# Patient Record
Sex: Male | Born: 1955 | Race: Black or African American | Hispanic: No | Smoking: Never smoker
Health system: Southern US, Community
[De-identification: ages and names within clinical notes are randomized; demographics above are authoritative.]

## PROBLEM LIST (undated history)

## (undated) DIAGNOSIS — I509 Heart failure, unspecified: Secondary | ICD-10-CM

## (undated) DIAGNOSIS — M1990 Unspecified osteoarthritis, unspecified site: Secondary | ICD-10-CM

## (undated) DIAGNOSIS — Z992 Dependence on renal dialysis: Secondary | ICD-10-CM

## (undated) DIAGNOSIS — N189 Chronic kidney disease, unspecified: Secondary | ICD-10-CM

## (undated) DIAGNOSIS — N186 End stage renal disease: Secondary | ICD-10-CM

## (undated) DIAGNOSIS — I10 Essential (primary) hypertension: Secondary | ICD-10-CM

## (undated) DIAGNOSIS — E119 Type 2 diabetes mellitus without complications: Secondary | ICD-10-CM

## (undated) HISTORY — PX: SPINE SURGERY: SHX786

## (undated) HISTORY — DX: Unspecified osteoarthritis, unspecified site: M19.90

## (undated) HISTORY — DX: Heart failure, unspecified: I50.9

## (undated) HISTORY — PX: KIDNEY TRANSPLANT: SHX239

## (undated) HISTORY — DX: Type 2 diabetes mellitus without complications: E11.9

## (undated) HISTORY — DX: Essential (primary) hypertension: I10

## (undated) HISTORY — DX: Chronic kidney disease, unspecified: N18.9

---

## 2000-09-17 ENCOUNTER — Inpatient Hospital Stay (HOSPITAL_COMMUNITY): Admission: EM | Admit: 2000-09-17 | Discharge: 2000-09-20 | Payer: Self-pay | Admitting: Emergency Medicine

## 2000-09-17 ENCOUNTER — Encounter: Payer: Self-pay | Admitting: Emergency Medicine

## 2000-09-17 ENCOUNTER — Encounter: Payer: Self-pay | Admitting: Internal Medicine

## 2000-09-18 ENCOUNTER — Encounter: Payer: Self-pay | Admitting: Internal Medicine

## 2000-09-20 ENCOUNTER — Encounter: Payer: Self-pay | Admitting: Internal Medicine

## 2000-09-28 ENCOUNTER — Encounter: Admission: RE | Admit: 2000-09-28 | Discharge: 2000-09-28 | Payer: Self-pay | Admitting: Internal Medicine

## 2000-10-10 ENCOUNTER — Emergency Department (HOSPITAL_COMMUNITY): Admission: EM | Admit: 2000-10-10 | Discharge: 2000-10-10 | Payer: Self-pay | Admitting: Emergency Medicine

## 2000-10-12 ENCOUNTER — Emergency Department (HOSPITAL_COMMUNITY): Admission: EM | Admit: 2000-10-12 | Discharge: 2000-10-12 | Payer: Self-pay | Admitting: Emergency Medicine

## 2000-11-25 ENCOUNTER — Encounter: Admission: RE | Admit: 2000-11-25 | Discharge: 2000-11-25 | Payer: Self-pay | Admitting: Internal Medicine

## 2000-11-30 ENCOUNTER — Encounter: Admission: RE | Admit: 2000-11-30 | Discharge: 2000-11-30 | Payer: Self-pay | Admitting: Internal Medicine

## 2000-12-02 ENCOUNTER — Encounter: Admission: RE | Admit: 2000-12-02 | Discharge: 2000-12-02 | Payer: Self-pay | Admitting: Internal Medicine

## 2003-01-15 ENCOUNTER — Emergency Department (HOSPITAL_COMMUNITY): Admission: EM | Admit: 2003-01-15 | Discharge: 2003-01-15 | Payer: Self-pay | Admitting: Emergency Medicine

## 2008-03-16 HISTORY — PX: KIDNEY TRANSPLANT: SHX239

## 2013-06-06 ENCOUNTER — Other Ambulatory Visit: Payer: Self-pay | Admitting: Orthopedic Surgery

## 2013-06-06 DIAGNOSIS — M545 Low back pain, unspecified: Secondary | ICD-10-CM

## 2013-06-07 ENCOUNTER — Ambulatory Visit
Admission: RE | Admit: 2013-06-07 | Discharge: 2013-06-07 | Disposition: A | Discharge status: Home / Self Care | Payer: Medicare Other | Source: Ambulatory Visit | Attending: Orthopedic Surgery | Admitting: Orthopedic Surgery

## 2013-06-07 DIAGNOSIS — M545 Low back pain, unspecified: Secondary | ICD-10-CM

## 2013-07-12 DIAGNOSIS — E118 Type 2 diabetes mellitus with unspecified complications: Secondary | ICD-10-CM

## 2013-07-12 HISTORY — DX: Type 2 diabetes mellitus with unspecified complications: E11.8

## 2013-08-10 ENCOUNTER — Ambulatory Visit (INDEPENDENT_AMBULATORY_CARE_PROVIDER_SITE_OTHER): Payer: Medicare HMO | Admitting: Emergency Medicine

## 2013-08-10 VITALS — BP 127/82 | HR 69 | Temp 97.9°F | Resp 16 | Ht 72.0 in | Wt 238.2 lb

## 2013-08-10 DIAGNOSIS — M5430 Sciatica, unspecified side: Secondary | ICD-10-CM

## 2013-08-10 DIAGNOSIS — Z940 Kidney transplant status: Secondary | ICD-10-CM

## 2013-08-10 HISTORY — DX: Kidney transplant status: Z94.0

## 2013-08-10 MED ORDER — OXYCODONE-ACETAMINOPHEN 10-325 MG PO TABS: 1.0000 | ORAL_TABLET | Freq: Three times a day (TID) | ORAL | Status: DC | PRN

## 2013-08-10 NOTE — Progress Notes (Signed)
[facility] [address], [place] [address] [phone]  Date:  08/10/2013   Name:  Jacob Daniel   DOB:  May 12, 1955   MRN:  [id number]  PCP:  Roselee Culver, MD    Chief Complaint: Back Pain   History of Present Illness:  Jacob Daniel is a 58 y.o. very pleasant male patient who presents with the following:  Comes in with history of prior lumbar spine surgery and has been under the care of a doctor in [place] and has recently moved back to the area.  MRI in March done and reviewed.  Had been receiving medication from nephrologist but he refused to provide further meds and suggested he see his FMD.  Has been taking oxycodone 10/325.  Has weakness in left leg with prolonged walking and no radicular pain.  Pain seems limited to low back.  Denies recent injury or overuse.  Retired [employer].  No improvement with over the counter medications or other home remedies. Denies other complaint or health concern today.   There are no active problems to display for this patient.   Past Medical History  Diagnosis Date  . CHF (congestive heart failure)   . Arthritis   . Chronic kidney disease   . Hypertension   . Diabetes mellitus without complication     Past Surgical History  Procedure Laterality Date  . Spine surgery    . Kidney transplant      History  Substance Use Topics  . Smoking status: Never Smoker   . Smokeless tobacco: Not on file  . Alcohol Use: Yes    History reviewed. No pertinent family history.  No Known Allergies  Medication list has been reviewed and updated.  No current outpatient prescriptions on file prior to visit.   No current facility-administered medications on file prior to visit.    Review of Systems:  As per HPI, otherwise negative.    Physical Examination: Filed Vitals:   08/10/13 1421  BP: 127/82  Pulse: 69  Temp: 97.9 F (36.6 C)  Resp: 16   Filed Vitals:   08/10/13 1421  Height: 6' (1.829 m)  Weight:  238 lb 3.2 oz (108.047 kg)   Body mass index is 32.3 kg/(m^2). Ideal Body Weight: Weight in (lb) to have BMI = 25: 183.9   GEN: WDWN, NAD, Non-toxic, Alert & Oriented x 3 HEENT: Atraumatic, Normocephalic.  Ears and Nose: No external deformity. EXTR: No clubbing/cyanosis/edema NEURO: Normal gait.  PSYCH: Normally interactive. Conversant. Not depressed or anxious appearing.  Calm demeanor.  Back:  Tender lumbar paraspinous.  Reflexes and motor intact.     Assessment and Plan: Sciatic neuritis Offered injections but refused Referred to pain management Oxycodone #30  Signed,  Ellison Carwin, MD

## 2013-08-29 ENCOUNTER — Telehealth: Payer: Self-pay

## 2013-08-29 NOTE — Telephone Encounter (Signed)
Patient called to ask about other locations of pain management. He says we referred him before. I told him I will call back and investigate for him. Looking into his chart he refused injections from pain clinic in [place]. I wonder if he was dismissed from practice recently? I need to call patient tomorrow and find out.   [phone]

## 2013-08-30 NOTE — Telephone Encounter (Signed)
Spoke with patient and let him know that we are waiting on anderson to respond to the message and that referrals will probably be sending to [facility] medical pain clinic

## 2013-08-30 NOTE — Telephone Encounter (Signed)
Patient is needing a refill for pain medication from Dr. Ouida Sills. oxyCODONE-acetaminophen (PERCOCET) 10-325 MG per tablet   Please call patient when ready for pick up.   Patient also waiting to hear back status of pain clinic appointment. He explained that he refused injections at [facility] (not a pain clinic- that's what patient said) because he saw the damage the injections did to his sister and she has issues walking still. This explains the message in epic about refusing injections. Patient clarified.

## 2013-09-01 NOTE — Telephone Encounter (Signed)
The patient will need to be seen for a refill on medication.    None of [place] are accepting patients requiring chronic narcotic treatment.  We need to find the status of his referral to pain management.  Also find out if he has done what is necessary to obtain medical records.

## 2013-09-03 NOTE — Telephone Encounter (Signed)
He does have his medical records.  He will RTC for a visit with Dr Ouida Sills while we are waiting on his referral to [facility].

## 2013-09-08 ENCOUNTER — Ambulatory Visit (INDEPENDENT_AMBULATORY_CARE_PROVIDER_SITE_OTHER): Payer: Medicare HMO | Admitting: Family Medicine

## 2013-09-08 VITALS — BP 124/78 | HR 64 | Temp 98.4°F | Resp 16 | Ht 72.0 in | Wt 235.8 lb

## 2013-09-08 DIAGNOSIS — M5430 Sciatica, unspecified side: Secondary | ICD-10-CM | POA: Insufficient documentation

## 2013-09-08 DIAGNOSIS — M545 Low back pain, unspecified: Secondary | ICD-10-CM

## 2013-09-08 DIAGNOSIS — I10 Essential (primary) hypertension: Secondary | ICD-10-CM | POA: Insufficient documentation

## 2013-09-08 DIAGNOSIS — M5432 Sciatica, left side: Secondary | ICD-10-CM

## 2013-09-08 HISTORY — DX: Sciatica, unspecified side: M54.30

## 2013-09-08 MED ORDER — OXYCODONE-ACETAMINOPHEN 10-325 MG PO TABS: 1.0000 | ORAL_TABLET | Freq: Three times a day (TID) | ORAL | Status: DC | PRN

## 2013-09-08 NOTE — Progress Notes (Signed)
Subjective:    Patient ID: Jacob Daniel, male    DOB: 06-Jul-1955, 58 y.o.   MRN: [id number]  Back Pain Associated symptoms include leg pain.  Leg Pain    Chief Complaint  Patient presents with   Back Pain    x 15 years   Leg Pain    sciatic nerve on left leg   This chart was scribed for Robyn Haber, MD by Thea Alken, ED Scribe. This patient was seen in room 1 and the patient's care was started at 9:55 AM.  HPI Comments: Jacob Daniel is a 58 y.o. male who [facility] complaining of constant left leg and back pain. Pt reports h/o back surgery 15 years ago and  siatica to left leg. States he has had back pain since surgery. Pt usually see Dr. Ouida Sills. Pt reports he takes percocet's 3 times a day for the pain. Pt has h/o of kidney transplant in to 2010 due to HTN. Pt denies complications. Pt is from [place] and has not found a neuprologist in the area.   Pt is retired from [employer].   Patient Active Problem List   Diagnosis Date Noted   History of renal transplant 08/10/2013   Past Medical History  Diagnosis Date   CHF (congestive heart failure)    Arthritis    Chronic kidney disease    Hypertension    Diabetes mellitus without complication    No Known Allergies Prior to Admission medications   Medication Sig Start Date End Date Taking? Authorizing Provider  AMLODIPINE BESYLATE PO Take by mouth.   Yes Historical Provider, MD  aspirin 81 MG tablet Take 81 mg by mouth daily.   Yes Historical Provider, MD  CARVEDILOL PO Take by mouth.   Yes Historical Provider, MD  GLIPIZIDE PO Take by mouth.   Yes Historical Provider, MD  Mycophenolate Mofetil (CELLCEPT PO) Take by mouth.   Yes Historical Provider, MD  oxyCODONE-acetaminophen (PERCOCET) 10-325 MG per tablet Take 1 tablet by mouth every 8 (eight) hours as needed for pain. 08/10/13  Yes Ellison Carwin, MD  Tacrolimus (PROGRAF PO) Take by mouth.   Yes Historical  Provider, MD   Review of Systems  Musculoskeletal: Positive for back pain.      Objective:   Physical Exam  Nursing note and vitals reviewed. Constitutional: He is oriented to person, place, and time. He appears well-developed and well-nourished. No distress.  HENT:  Head: Normocephalic and atraumatic.  Eyes: Conjunctivae and EOM are normal.  Neck: Neck supple. No tracheal deviation present.  Cardiovascular: Normal rate.   Pulmonary/Chest: Effort normal. No respiratory distress.  Abdominal: Soft. He exhibits no distension and no mass. There is no tenderness. There is no rebound and no guarding.  Musculoskeletal: Normal range of motion.  2+ tibial edema. Positive straight leg raise on the left. Negative right.    Neurological: He is alert and oriented to person, place, and time.  Diminished reflex   Skin: Skin is warm and dry.  Psychiatric: He has a normal mood and affect. His behavior is normal.      Assessment & Plan:   1. Sciatica, left   2. Left low back pain, with sciatica presence unspecified    Meds ordered this encounter  Medications   oxyCODONE-acetaminophen (PERCOCET) 10-325 MG per tablet    Sig: Take 1 tablet by mouth every 8 (eight) hours as needed for pain.    Dispense:  90  tablet    Refill:  0    pain clinic appt pending in July  Robyn Haber, MD  .

## 2013-09-20 ENCOUNTER — Telehealth: Payer: Self-pay

## 2013-09-20 DIAGNOSIS — E118 Type 2 diabetes mellitus with unspecified complications: Secondary | ICD-10-CM

## 2013-09-20 NOTE — Telephone Encounter (Signed)
Patient called requesting that Dr. Ouida Sills put in a referral for him to see a podiatrist. Please advise if this can be done or if patient needs to return to clinic for eval.   Best: [phone]

## 2013-09-20 NOTE — Telephone Encounter (Signed)
Pended the referral for podiatry. Please advise.

## 2013-09-20 NOTE — Telephone Encounter (Signed)
Signed order. Pt advised.

## 2013-09-20 NOTE — Telephone Encounter (Signed)
Please send it through

## 2013-10-03 ENCOUNTER — Telehealth: Payer: Self-pay

## 2013-10-03 DIAGNOSIS — M5430 Sciatica, unspecified side: Secondary | ICD-10-CM

## 2013-10-03 NOTE — Telephone Encounter (Signed)
PT STATES HE IS IN NEED OF HER OXYCODONE 10-325MG S. PLEASE CALL [phone] WHEN READY FOR PICK UP

## 2013-10-03 NOTE — Telephone Encounter (Signed)
Patient will need to see pain specialist as I am not licensed to prescribe chronic narcotics.

## 2013-10-04 NOTE — Telephone Encounter (Signed)
Notified pt that we are referring him to pain specialist. He thanked [place] but advised he has an appt already scheduled for [facility] [place] next Monday. I let Referrals know on VM our referral not needed and also forwarded this to [facility].

## 2013-10-05 NOTE — Telephone Encounter (Signed)
Referrals is aware and has cancelled future pain clinic referral

## 2013-10-09 ENCOUNTER — Encounter: Payer: Self-pay | Admitting: Podiatry

## 2013-10-09 ENCOUNTER — Ambulatory Visit (INDEPENDENT_AMBULATORY_CARE_PROVIDER_SITE_OTHER): Payer: Medicare HMO | Admitting: Podiatry

## 2013-10-09 DIAGNOSIS — B351 Tinea unguium: Secondary | ICD-10-CM

## 2013-10-09 DIAGNOSIS — M79609 Pain in unspecified limb: Secondary | ICD-10-CM

## 2013-10-09 DIAGNOSIS — M79673 Pain in unspecified foot: Secondary | ICD-10-CM

## 2013-10-09 NOTE — Progress Notes (Deleted)
   Subjective:    Patient ID: Jacob Daniel, male    DOB: April 21, 1955, 58 y.o.   MRN: [id number]  HPI Comments: "I need my feet checked"  Patient states he needs to have his feet checked. He is diabetic. Also, his wife usually cuts his nail but would like them trimmed today.  Diabetes      Review of Systems  All other systems reviewed and are negative.      Objective:   Physical Exam        Assessment & Plan:

## 2013-10-09 NOTE — Progress Notes (Signed)
Subjective:     Patient ID: Jacob Daniel, male   DOB: 19-Aug-1955, 58 y.o.   MRN: [id number]  Diabetes   patient with damaged nailbeds 1-5 both feet that are thick and he cannot take care of himself   Review of Systems  All other systems reviewed and are negative.      Objective:   Physical Exam  Nursing note and vitals reviewed. Constitutional: He is oriented to person, place, and time.  Cardiovascular: Intact distal pulses.   Musculoskeletal: Normal range of motion.  Neurological: He is oriented to person, place, and time.  Skin: Skin is warm and dry.   neurovascular status is intact with muscle strength mildly diminished and range of motion subtalar midtarsal joint mildly diminished. Patient's found to have well-perfused digits with slight decrease in hair growth and is found to have nail disease 1-5 both feet that are thick with yellow subungual debris and painful when palpated     Assessment:     At risk diabetic with thick painful nailbeds 1-5 both feet    Plan:     H&P and diabetic education rendered and debrided nailbeds 1-5 both feet with no iatrogenic bleeding noted

## 2013-10-09 NOTE — Patient Instructions (Signed)
Diabetes and Foot Care Diabetes may cause you to have problems because of poor blood supply (circulation) to your feet and legs. This may cause the skin on your feet to become thinner, break easier, and heal more slowly. Your skin may become dry, and the skin may peel and crack. You may also have nerve damage in your legs and feet causing decreased feeling in them. You may not notice minor injuries to your feet that could lead to infections or more serious problems. Taking care of your feet is one of the most important things you can do for yourself.  HOME CARE INSTRUCTIONS  Wear shoes at all times, even in the house. Do not go barefoot. Bare feet are easily injured.  Check your feet daily for blisters, cuts, and redness. If you cannot see the bottom of your feet, use a mirror or ask someone for help.  Wash your feet with warm water (do not use hot water) and mild soap. Then pat your feet and the areas between your toes until they are completely dry. Do not soak your feet as this can dry your skin.  Apply a moisturizing lotion or petroleum jelly (that does not contain alcohol and is unscented) to the skin on your feet and to dry, brittle toenails. Do not apply lotion between your toes.  Trim your toenails straight across. Do not dig under them or around the cuticle. File the edges of your nails with an emery board or nail file.  Do not cut corns or calluses or try to remove them with medicine.  Wear clean socks or stockings every day. Make sure they are not too tight. Do not wear knee-high stockings since they may decrease blood flow to your legs.  Wear shoes that fit properly and have enough cushioning. To break in new shoes, wear them for just a few hours a day. This prevents you from injuring your feet. Always look in your shoes before you put them on to be sure there are no objects inside.  Do not cross your legs. This may decrease the blood flow to your feet.  If you find a minor scrape,  cut, or break in the skin on your feet, keep it and the skin around it clean and dry. These areas may be cleansed with mild soap and water. Do not cleanse the area with peroxide, alcohol, or iodine.  When you remove an adhesive bandage, be sure not to damage the skin around it.  If you have a wound, look at it several times a day to make sure it is healing.  Do not use heating pads or hot water bottles. They may burn your skin. If you have lost feeling in your feet or legs, you may not know it is happening until it is too late.  Make sure your health care provider performs a complete foot exam at least annually or more often if you have foot problems. Report any cuts, sores, or bruises to your health care provider immediately. SEEK MEDICAL CARE IF:   You have an injury that is not healing.  You have cuts or breaks in the skin.  You have an ingrown nail.  You notice redness on your legs or feet.  You feel burning or tingling in your legs or feet.  You have pain or cramps in your legs and feet.  Your legs or feet are numb.  Your feet always feel cold. SEEK IMMEDIATE MEDICAL CARE IF:   There is increasing redness,   swelling, or pain in or around a wound.  There is a red line that goes up your leg.  Pus is coming from a wound.  You develop a fever or as directed by your health care provider.  You notice a bad smell coming from an ulcer or wound. Document Released: 02/28/2000 Document Revised: 11/02/2012 Document Reviewed: 08/09/2012 ExitCare Patient Information 2015 [employer]. This information is not intended to replace advice given to you by your health care provider. Make sure you discuss any questions you have with your health care provider.  

## 2013-10-09 NOTE — Progress Notes (Signed)
   Subjective:    Patient ID: Jacob Daniel, male    DOB: [birth year], 58 y.o.   MRN: [id number]  HPI  Pt presents for nail debridement, and diabetic foot exam.  Review of Systems  Constitutional: Positive for unexpected weight change.  Respiratory: Positive for shortness of breath.   Musculoskeletal: Positive for back pain.  Neurological: Positive for weakness.  All other systems reviewed and are negative.      Objective:   Physical Exam        Assessment & Plan:

## 2013-11-21 ENCOUNTER — Telehealth: Payer: Self-pay

## 2013-11-21 NOTE — Telephone Encounter (Signed)
PT IS REQUESTING THAT DR  Edmund Hilda HIM A LETTER STATING HE NEEDS TO KEEP HIS 2 BEDROOM APT SO HE CAN USE THE SECOND BEDROOM FOR HIS WORK OUT AREA. HE STATES HE HAS EXTREME SCIATICA AND HE CANNOT GET OUT TO A GYM AWAYS DUE TO THAT.THE NEW SECTION 8 LAWS REQUIRE HIM TO DOWNSIZE TO A ONE BEDROOM UNLESS IT IS MEDICALLY SUPPORTED.  BEST PHONE FOR PT IS [phone]

## 2013-11-21 NOTE — Telephone Encounter (Signed)
Dr. Joseph Art please advise letter can be written.

## 2013-11-22 ENCOUNTER — Encounter: Payer: Self-pay | Admitting: Family Medicine

## 2014-01-12 ENCOUNTER — Telehealth: Payer: Self-pay | Admitting: Emergency Medicine

## 2014-01-12 DIAGNOSIS — R2681 Unsteadiness on feet: Secondary | ICD-10-CM

## 2014-01-12 DIAGNOSIS — R29898 Other symptoms and signs involving the musculoskeletal system: Secondary | ICD-10-CM

## 2014-01-12 NOTE — Telephone Encounter (Signed)
Patient states that a nurse from [facility] recommends that he get a home health nurse and a cane to help with walking. Please advise  [phone]

## 2014-01-15 NOTE — Telephone Encounter (Signed)
Since patient has reported to Dr. Ouida Sills that his left leg becomes weak with prolonged walking, it seems reasonable to use a cane when walking over 100 yds.

## 2014-01-16 NOTE — Telephone Encounter (Signed)
Orders have been sent 

## 2014-01-22 ENCOUNTER — Encounter: Payer: Medicare HMO | Admitting: *Deleted

## 2014-01-24 NOTE — Progress Notes (Signed)
This encounter was created in error - please disregard.

## 2014-01-29 ENCOUNTER — Encounter: Payer: Medicare HMO | Admitting: Podiatry

## 2014-01-29 NOTE — Progress Notes (Signed)
This encounter was created in error - please disregard.  This encounter was created in error - please disregard.

## 2014-01-29 NOTE — Progress Notes (Deleted)
Subjective:     Patient ID: Jacob Daniel, male   DOB: 1955/06/10, 58 y.o.   MRN: [id number]  Diabetes   patient with damaged nailbeds 1-5 both feet that are thick and he cannot take care of himself   Review of Systems  All other systems reviewed and are negative.      Objective:   Physical Exam  Nursing note and vitals reviewed. Constitutional: He is oriented to person, place, and time.  Cardiovascular: Intact distal pulses.   Musculoskeletal: Normal range of motion.  Neurological: He is oriented to person, place, and time.  Skin: Skin is warm and dry.   neurovascular status is intact with muscle strength mildly diminished and range of motion subtalar midtarsal joint mildly diminished. Patient's found to have well-perfused digits with slight decrease in hair growth and is found to have nail disease 1-5 both feet that are thick with yellow subungual debris and painful when palpated     Assessment:     At risk diabetic with thick painful nailbeds 1-5 both feet    Plan:     H&P and diabetic education rendered and debrided nailbeds 1-5 both feet with no iatrogenic bleeding noted

## 2014-01-31 ENCOUNTER — Telehealth: Payer: Self-pay

## 2014-01-31 NOTE — Telephone Encounter (Signed)
Patient called needing a referral from [facility] to authorize his visit with [facility] on his Monday appointment. Patient has end stage renal disease. We are his pcp, and hes been going their for years for this. I did [employer] referral online but it is currently suspended. Once Jacob Daniel comes through please call [facility] nephrology at [phone] to give auth number to keep patients appointment.  Thank you!  I just want to make sure that someone other than me can look online when i'm not in referrals the day it gets authorized and they don't drop his appointment.

## 2014-07-16 ENCOUNTER — Ambulatory Visit (INDEPENDENT_AMBULATORY_CARE_PROVIDER_SITE_OTHER): Payer: Commercial Managed Care - HMO | Admitting: Podiatry

## 2014-07-16 DIAGNOSIS — M79673 Pain in unspecified foot: Secondary | ICD-10-CM | POA: Diagnosis not present

## 2014-07-16 DIAGNOSIS — B351 Tinea unguium: Secondary | ICD-10-CM

## 2014-07-16 NOTE — Progress Notes (Signed)
Subjective:     Patient ID: Jacob Daniel, male   DOB: 02-22-1956, 59 y.o.   MRN: [id number]  Diabetes   patient with damaged nailbeds 1-5 both feet that are thick and he cannot take care of himself   Review of Systems  All other systems reviewed and are negative.      Objective:   Physical Exam  Nursing note and vitals reviewed. Constitutional: He is oriented to person, place, and time.  Cardiovascular: Intact distal pulses.   Musculoskeletal: Normal range of motion.  Neurological: He is oriented to person, place, and time.  Skin: Skin is warm and dry.   neurovascular status is intact with muscle strength mildly diminished and range of motion subtalar midtarsal joint mildly diminished. Patient's found to have well-perfused digits with slight decrease in hair growth and is found to have nail disease 1-5 both feet that are thick with yellow subungual debris and painful when palpated     Assessment:     At risk diabetic with thick painful nailbeds 1-5 both feet    Plan:     H&P and diabetic education rendered and debrided nailbeds 1-5 both feet with no iatrogenic bleeding noted

## 2014-10-15 ENCOUNTER — Ambulatory Visit: Payer: Commercial Managed Care - HMO | Admitting: Podiatry

## 2014-10-16 ENCOUNTER — Encounter: Payer: Self-pay | Admitting: Podiatry

## 2014-10-16 ENCOUNTER — Ambulatory Visit (INDEPENDENT_AMBULATORY_CARE_PROVIDER_SITE_OTHER): Payer: Commercial Managed Care - HMO | Admitting: Podiatry

## 2014-10-16 DIAGNOSIS — B351 Tinea unguium: Secondary | ICD-10-CM

## 2014-10-16 DIAGNOSIS — M79676 Pain in unspecified toe(s): Secondary | ICD-10-CM | POA: Diagnosis not present

## 2014-10-16 NOTE — Progress Notes (Signed)
Patient ID: Jacob Daniel, male   DOB: 1955/07/17, 59 y.o.   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

## 2014-10-16 NOTE — Patient Instructions (Signed)
Diabetes and Foot Care Diabetes may cause you to have problems because of poor blood supply (circulation) to your feet and legs. This may cause the skin on your feet to become thinner, break easier, and heal more slowly. Your skin may become dry, and the skin may peel and crack. You may also have nerve damage in your legs and feet causing decreased feeling in them. You may not notice minor injuries to your feet that could lead to infections or more serious problems. Taking care of your feet is one of the most important things you can do for yourself.  HOME CARE INSTRUCTIONS  Wear shoes at all times, even in the house. Do not go barefoot. Bare feet are easily injured.  Check your feet daily for blisters, cuts, and redness. If you cannot see the bottom of your feet, use a mirror or ask someone for help.  Wash your feet with warm water (do not use hot water) and mild soap. Then pat your feet and the areas between your toes until they are completely dry. Do not soak your feet as this can dry your skin.  Apply a moisturizing lotion or petroleum jelly (that does not contain alcohol and is unscented) to the skin on your feet and to dry, brittle toenails. Do not apply lotion between your toes.  Trim your toenails straight across. Do not dig under them or around the cuticle. File the edges of your nails with an emery board or nail file.  Do not cut corns or calluses or try to remove them with medicine.  Wear clean socks or stockings every day. Make sure they are not too tight. Do not wear knee-high stockings since they may decrease blood flow to your legs.  Wear shoes that fit properly and have enough cushioning. To break in new shoes, wear them for just a few hours a day. This prevents you from injuring your feet. Always look in your shoes before you put them on to be sure there are no objects inside.  Do not cross your legs. This may decrease the blood flow to your feet.  If you find a minor scrape,  cut, or break in the skin on your feet, keep it and the skin around it clean and dry. These areas may be cleansed with mild soap and water. Do not cleanse the area with peroxide, alcohol, or iodine.  When you remove an adhesive bandage, be sure not to damage the skin around it.  If you have a wound, look at it several times a day to make sure it is healing.  Do not use heating pads or hot water bottles. They may burn your skin. If you have lost feeling in your feet or legs, you may not know it is happening until it is too late.  Make sure your health care provider performs a complete foot exam at least annually or more often if you have foot problems. Report any cuts, sores, or bruises to your health care provider immediately. SEEK MEDICAL CARE IF:   You have an injury that is not healing.  You have cuts or breaks in the skin.  You have an ingrown nail.  You notice redness on your legs or feet.  You feel burning or tingling in your legs or feet.  You have pain or cramps in your legs and feet.  Your legs or feet are numb.  Your feet always feel cold. SEEK IMMEDIATE MEDICAL CARE IF:   There is increasing redness,   swelling, or pain in or around a wound.  There is a red line that goes up your leg.  Pus is coming from a wound.  You develop a fever or as directed by your health care provider.  You notice a bad smell coming from an ulcer or wound. Document Released: 02/28/2000 Document Revised: 11/02/2012 Document Reviewed: 08/09/2012 ExitCare Patient Information 2015 [employer]. This information is not intended to replace advice given to you by your health care provider. Make sure you discuss any questions you have with your health care provider.  

## 2014-11-29 ENCOUNTER — Telehealth: Payer: Self-pay

## 2014-11-29 NOTE — Telephone Encounter (Signed)
I have not seen patient in over a year.  I am not his primary care physician.

## 2014-11-29 NOTE — Telephone Encounter (Signed)
Patient requesting a referral for [name] for home health care. Patients call back number is [phone]

## 2014-11-29 NOTE — Telephone Encounter (Signed)
Spoke with pt, advised message from Dr. Joseph Art. He states Dr. Ouida Sills is his primary care doctor.

## 2015-01-16 ENCOUNTER — Ambulatory Visit (INDEPENDENT_AMBULATORY_CARE_PROVIDER_SITE_OTHER): Payer: Commercial Managed Care - HMO | Admitting: Podiatry

## 2015-01-16 ENCOUNTER — Encounter: Payer: Self-pay | Admitting: Podiatry

## 2015-01-16 DIAGNOSIS — B351 Tinea unguium: Secondary | ICD-10-CM

## 2015-01-16 DIAGNOSIS — M79676 Pain in unspecified toe(s): Secondary | ICD-10-CM

## 2015-01-16 NOTE — Progress Notes (Signed)
Patient ID: Jacob Daniel, male   DOB: 1955/06/18, 59 y.o.   MRN: [id number]    subjective:   this patient presents today for scheduled visit complaining of painful toenails and walking wearing shoes and requesting nail debridement    objective:  no skin lesions bilaterally  the toenails are elongated, discolored, hypertrophic , incurvated and tender direct palpation 6-10    assessment:  diabetic  symptomatic onychomycoses 6-10   Plan:  debrided toenails 10 mechanically and electrically without any bleeding  Reappoint 3 months

## 2015-01-16 NOTE — Patient Instructions (Signed)
Diabetes and Foot Care Diabetes may cause you to have problems because of poor blood supply (circulation) to your feet and legs. This may cause the skin on your feet to become thinner, break easier, and heal more slowly. Your skin may become dry, and the skin may peel and crack. You may also have nerve damage in your legs and feet causing decreased feeling in them. You may not notice minor injuries to your feet that could lead to infections or more serious problems. Taking care of your feet is one of the most important things you can do for yourself.  HOME CARE INSTRUCTIONS  Wear shoes at all times, even in the house. Do not go barefoot. Bare feet are easily injured.  Check your feet daily for blisters, cuts, and redness. If you cannot see the bottom of your feet, use a mirror or ask someone for help.  Wash your feet with warm water (do not use hot water) and mild soap. Then pat your feet and the areas between your toes until they are completely dry. Do not soak your feet as this can dry your skin.  Apply a moisturizing lotion or petroleum jelly (that does not contain alcohol and is unscented) to the skin on your feet and to dry, brittle toenails. Do not apply lotion between your toes.  Trim your toenails straight across. Do not dig under them or around the cuticle. File the edges of your nails with an emery board or nail file.  Do not cut corns or calluses or try to remove them with medicine.  Wear clean socks or stockings every day. Make sure they are not too tight. Do not wear knee-high stockings since they may decrease blood flow to your legs.  Wear shoes that fit properly and have enough cushioning. To break in new shoes, wear them for just a few hours a day. This prevents you from injuring your feet. Always look in your shoes before you put them on to be sure there are no objects inside.  Do not cross your legs. This may decrease the blood flow to your feet.  If you find a minor scrape,  cut, or break in the skin on your feet, keep it and the skin around it clean and dry. These areas may be cleansed with mild soap and water. Do not cleanse the area with peroxide, alcohol, or iodine.  When you remove an adhesive bandage, be sure not to damage the skin around it.  If you have a wound, look at it several times a day to make sure it is healing.  Do not use heating pads or hot water bottles. They may burn your skin. If you have lost feeling in your feet or legs, you may not know it is happening until it is too late.  Make sure your health care provider performs a complete foot exam at least annually or more often if you have foot problems. Report any cuts, sores, or bruises to your health care provider immediately. SEEK MEDICAL CARE IF:   You have an injury that is not healing.  You have cuts or breaks in the skin.  You have an ingrown nail.  You notice redness on your legs or feet.  You feel burning or tingling in your legs or feet.  You have pain or cramps in your legs and feet.  Your legs or feet are numb.  Your feet always feel cold. SEEK IMMEDIATE MEDICAL CARE IF:   There is increasing redness,   swelling, or pain in or around a wound.  There is a red line that goes up your leg.  Pus is coming from a wound.  You develop a fever or as directed by your health care provider.  You notice a bad smell coming from an ulcer or wound.   This information is not intended to replace advice given to you by your health care provider. Make sure you discuss any questions you have with your health care provider.   Document Released: 02/28/2000 Document Revised: 11/02/2012 Document Reviewed: 08/09/2012 Elsevier Interactive Patient Education 2016 [employer]vier Inc[employer]  

## 2015-01-23 ENCOUNTER — Telehealth: Payer: Self-pay | Admitting: Family Medicine

## 2015-01-23 NOTE — Telephone Encounter (Signed)
Spoke with patient  About making appointment for diabetes care and he stated that he sees, Dr. Belinda Block at [facility] for his primary care.

## 2015-02-13 ENCOUNTER — Ambulatory Visit (INDEPENDENT_AMBULATORY_CARE_PROVIDER_SITE_OTHER): Payer: Commercial Managed Care - HMO | Admitting: Podiatry

## 2015-02-13 ENCOUNTER — Ambulatory Visit (INDEPENDENT_AMBULATORY_CARE_PROVIDER_SITE_OTHER): Payer: Commercial Managed Care - HMO

## 2015-02-13 ENCOUNTER — Encounter: Payer: Self-pay | Admitting: Podiatry

## 2015-02-13 DIAGNOSIS — M722 Plantar fascial fibromatosis: Secondary | ICD-10-CM

## 2015-02-13 DIAGNOSIS — R52 Pain, unspecified: Secondary | ICD-10-CM | POA: Diagnosis not present

## 2015-02-13 NOTE — Progress Notes (Signed)
   Subjective:    Patient ID: Jacob Daniel, male    DOB: [birth year], 59 y.o.   MRN: [id number]  HPI    This patient presents today complaining of right plantar foot pain for the past 5 days gradually increasing pain with standing walking relieved with rest. Denies any direct injury or change of activity. Patient has worn athletic style shoe with slight reduction of discomfort in the area. This patient was last evaluated in 01/16/2015 for debridement of mycotic toenails  Patient is a diabetic with a history of renal transplant   Review of Systems  Musculoskeletal: Positive for back pain and gait problem.       Objective:   Physical Exam  Orientated 3  Vascular: No peripheral edema noted bilaterally DP and PT pulses 2/4 bilaterally Capillary reflex immediate bilaterally  Neurological: Sensation to 10 g monofilament wire intact 4/5 bilaterally Vibratory sensation reactive bilaterally Ankle reflex equal and reactive bilaterally  Dermatological: Dry skin bilaterally No open skin lesions bilaterally The toenails are elongated, brittle, hypertrophic 6-10  Musculoskeletal: HAV deformity right Palpable tenderness medial central inferior heel right without any palpable lesions. The fat pad is adequate Mild palpable tenderness tendo Achilles right without any palpable lesions    X-ray examination weightbearing right foot debated 02/13/2015  Intact bony structure without fracture and/or dislocation Bone density appears adequate Joint space appear adequate Inferior calcaneal spur right  Radiographic impression: No acute bony abnormality noted in the right foot       Assessment & Plan:   Assessment: Satisfactory neurovascular status Diabetic without foot complications Plantar fasciitis right Achilles tendinitis right  Plan: Today I reviewed the results of examination x-ray with patient today. Fascial taping applied right with instructions to leave on if possible  3-5 days Upon removal begin gentle stretching exercise and were correct shoes  Reappoint at patient's request for follow-up for plantar fasciitis and for previously scheduled debridement of mycotic toenails

## 2015-02-13 NOTE — Patient Instructions (Signed)
Plantar Fasciitis Plantar fasciitis is a painful foot condition that affects the heel. It occurs when the band of tissue that connects the toes to the heel bone (plantar fascia) becomes irritated. This can happen after exercising too much or doing other repetitive activities (overuse injury). The pain from plantar fasciitis can range from mild irritation to severe pain that makes it difficult for you to walk or move. The pain is usually worse in the morning or after you have been sitting or lying down for a while. CAUSES This condition may be caused by:  Standing for long periods of time.  Wearing shoes that do not fit.  Doing high-impact activities, including running, aerobics, and ballet.  Being overweight.  Having an abnormal way of walking (gait).  Having tight calf muscles.  Having high arches in your feet.  Starting a new athletic activity. SYMPTOMS The main symptom of this condition is heel pain. Other symptoms include:  Pain that gets worse after activity or exercise.  Pain that is worse in the morning or after resting.  Pain that goes away after you walk for a few minutes. DIAGNOSIS This condition may be diagnosed based on your signs and symptoms. Your health care provider will also do a physical exam to check for:  A tender area on the bottom of your foot.  A high arch in your foot.  Pain when you move your foot.  Difficulty moving your foot. You may also need to have imaging studies to confirm the diagnosis. These can include:  X-rays.  Ultrasound.  MRI. TREATMENT  Treatment for plantar fasciitis depends on the severity of the condition. Your treatment may include:  Rest, ice, and over-the-counter pain medicines to manage your pain.  Exercises to stretch your calves and your plantar fascia.  A splint that holds your foot in a stretched, upward position while you sleep (night splint).  Physical therapy to relieve symptoms and prevent problems in the  future.  Cortisone injections to relieve severe pain.  Extracorporeal shock wave therapy (ESWT) to stimulate damaged plantar fascia with electrical impulses. It is often used as a last resort before surgery.  Surgery, if other treatments have not worked after 12 months. HOME CARE INSTRUCTIONS  Take medicines only as directed by your health care provider.  Avoid activities that cause pain.  Roll the bottom of your foot over a bag of ice or a bottle of cold water. Do this for 20 minutes, 3-4 times a day.  Perform simple stretches as directed by your health care provider.  Try wearing athletic shoes with air-sole or gel-sole cushions or soft shoe inserts.  Wear a night splint while sleeping, if directed by your health care provider.  Keep all follow-up appointments with your health care provider. PREVENTION   Do not perform exercises or activities that cause heel pain.  Consider finding low-impact activities if you continue to have problems.  Lose weight if you need to. The best way to prevent plantar fasciitis is to avoid the activities that aggravate your plantar fascia. SEEK MEDICAL CARE IF:  Your symptoms do not go away after treatment with home care measures.  Your pain gets worse.  Your pain affects your ability to move or do your daily activities.   This information is not intended to replace advice given to you by your health care provider. Make sure you discuss any questions you have with your health care provider.   Document Released: 11/25/2000 Document Revised: 11/21/2014 Document Reviewed: 01/10/2014 Elsevier  Interactive Patient Education 2016 [place] - Knee Calf Stretch     1) Stand an arm's length away from a wall. Place the palms of your hands on the wall. Step forward about 12 inches with the opposite foot.  2) Keeping toes pointed forward and both heels on the floor, bend both knees and lean forward. Hold this position for 60 seconds.  Don't arch your back and don't hunch your shoulders.  3) Repeat this twice.  DO THIS STRETCHING TECHNIQUE 3 TIMES A DAY.   Stretching Exercises before Standing  Pull your toes up toward your nose and hold for 1 minute before standing.  A towel can assist with this exercise if you put the towel under the ball of your foot. This exercise reduces the intense   pain associated when changing from a seated to a standing position. This stretch can usually be the most beneficial if done before getting out of bed in the mornings.     Diabetes and Foot Care Diabetes may cause you to have problems because of poor blood supply (circulation) to your feet and legs. This may cause the skin on your feet to become thinner, break easier, and heal more slowly. Your skin may become dry, and the skin may peel and crack. You may also have nerve damage in your legs and feet causing decreased feeling in them. You may not notice minor injuries to your feet that could lead to infections or more serious problems. Taking care of your feet is one of the most important things you can do for yourself.  HOME CARE INSTRUCTIONS  Wear shoes at all times, even in the house. Do not go barefoot. Bare feet are easily injured.  Check your feet daily for blisters, cuts, and redness. If you cannot see the bottom of your feet, use a mirror or ask someone for help.  Wash your feet with warm water (do not use hot water) and mild soap. Then pat your feet and the areas between your toes until they are completely dry. Do not soak your feet as this can dry your skin.  Apply a moisturizing lotion or petroleum jelly (that does not contain alcohol and is unscented) to the skin on your feet and to dry, brittle toenails. Do not apply lotion between your toes.  Trim your toenails straight across. Do not dig under them or around the cuticle. File the edges of your nails with an emery board or nail file.  Do not cut corns or calluses or try to  remove them with medicine.  Wear clean socks or stockings every day. Make sure they are not too tight. Do not wear knee-high stockings since they may decrease blood flow to your legs.  Wear shoes that fit properly and have enough cushioning. To break in new shoes, wear them for just a few hours a day. This prevents you from injuring your feet. Always look in your shoes before you put them on to be sure there are no objects inside.  Do not cross your legs. This may decrease the blood flow to your feet.  If you find a minor scrape, cut, or break in the skin on your feet, keep it and the skin around it clean and dry. These areas may be cleansed with mild soap and water. Do not cleanse the area with peroxide, alcohol, or iodine.  When you remove an adhesive bandage, be sure not to damage the skin around it.  If  you have a wound, look at it several times a day to make sure it is healing.  Do not use heating pads or hot water bottles. They may burn your skin. If you have lost feeling in your feet or legs, you may not know it is happening until it is too late.  Make sure your health care provider performs a complete foot exam at least annually or more often if you have foot problems. Report any cuts, sores, or bruises to your health care provider immediately. SEEK MEDICAL CARE IF:   You have an injury that is not healing.  You have cuts or breaks in the skin.  You have an ingrown nail.  You notice redness on your legs or feet.  You feel burning or tingling in your legs or feet.  You have pain or cramps in your legs and feet.  Your legs or feet are numb.  Your feet always feel cold. SEEK IMMEDIATE MEDICAL CARE IF:   There is increasing redness, swelling, or pain in or around a wound.  There is a red line that goes up your leg.  Pus is coming from a wound.  You develop a fever or as directed by your health care provider.  You notice a bad smell coming from an ulcer or wound.     This information is not intended to replace advice given to you by your health care provider. Make sure you discuss any questions you have with your health care provider.   Document Released: 02/28/2000 Document Revised: 11/02/2012 Document Reviewed: 08/09/2012 Elsevier Interactive Patient Education [employer].

## 2015-03-05 DIAGNOSIS — Z79899 Other long term (current) drug therapy: Secondary | ICD-10-CM

## 2015-03-05 DIAGNOSIS — Z940 Kidney transplant status: Secondary | ICD-10-CM

## 2015-03-05 HISTORY — DX: Other long term (current) drug therapy: Z79.899

## 2015-05-01 ENCOUNTER — Ambulatory Visit: Payer: Commercial Managed Care - HMO | Admitting: Podiatry

## 2015-05-22 ENCOUNTER — Ambulatory Visit: Payer: Commercial Managed Care - HMO | Admitting: Podiatry

## 2015-05-31 ENCOUNTER — Other Ambulatory Visit (HOSPITAL_COMMUNITY): Payer: Self-pay | Admitting: Internal Medicine

## 2015-05-31 DIAGNOSIS — I509 Heart failure, unspecified: Secondary | ICD-10-CM

## 2015-06-04 ENCOUNTER — Ambulatory Visit (HOSPITAL_COMMUNITY)
Admission: RE | Admit: 2015-06-04 | Discharge: 2015-06-04 | Disposition: A | Discharge status: Home / Self Care | Payer: Medicare (Managed Care) | Source: Ambulatory Visit | Attending: Internal Medicine | Admitting: Internal Medicine

## 2015-06-04 DIAGNOSIS — I509 Heart failure, unspecified: Secondary | ICD-10-CM | POA: Insufficient documentation

## 2015-06-04 DIAGNOSIS — I110 Hypertensive heart disease with heart failure: Secondary | ICD-10-CM | POA: Insufficient documentation

## 2015-06-04 NOTE — Progress Notes (Signed)
Echocardiogram 2D Echocardiogram has been performed.  Tresa Res 06/04/2015, 10:37 AM

## 2015-11-14 ENCOUNTER — Other Ambulatory Visit: Payer: Self-pay | Admitting: Family Medicine

## 2015-11-14 DIAGNOSIS — R102 Pelvic and perineal pain: Secondary | ICD-10-CM

## 2015-11-27 ENCOUNTER — Ambulatory Visit
Admission: RE | Admit: 2015-11-27 | Discharge: 2015-11-27 | Disposition: A | Discharge status: Home / Self Care | Payer: Medicare (Managed Care) | Source: Ambulatory Visit | Attending: Family Medicine | Admitting: Family Medicine

## 2015-11-27 DIAGNOSIS — R102 Pelvic and perineal pain: Secondary | ICD-10-CM

## 2016-01-16 ENCOUNTER — Other Ambulatory Visit (HOSPITAL_BASED_OUTPATIENT_CLINIC_OR_DEPARTMENT_OTHER): Payer: Self-pay

## 2016-01-16 DIAGNOSIS — G4733 Obstructive sleep apnea (adult) (pediatric): Secondary | ICD-10-CM

## 2016-01-22 ENCOUNTER — Ambulatory Visit (INDEPENDENT_AMBULATORY_CARE_PROVIDER_SITE_OTHER): Payer: Medicare (Managed Care) | Admitting: Orthopedic Surgery

## 2016-01-22 ENCOUNTER — Encounter (INDEPENDENT_AMBULATORY_CARE_PROVIDER_SITE_OTHER): Payer: Self-pay | Admitting: Orthopedic Surgery

## 2016-01-22 DIAGNOSIS — M25552 Pain in left hip: Secondary | ICD-10-CM | POA: Diagnosis not present

## 2016-01-22 DIAGNOSIS — M4726 Other spondylosis with radiculopathy, lumbar region: Secondary | ICD-10-CM

## 2016-01-22 DIAGNOSIS — M25551 Pain in right hip: Secondary | ICD-10-CM

## 2016-01-22 NOTE — Progress Notes (Signed)
Office Visit Note   Patient: Jacob Daniel           Date of Birth: Jun 18, 1955           MRN: [id number] Visit Date: 01/22/2016 Requested by: Jacob Pou, MD [address] [place], [place] [address] PCP: Jacob Pou, MD  Subjective: Chief Complaint  Patient presents with  . Left Hip - Pain  . Right Hip - Pain    HPI Jacob Daniel 60 year old patient with right greater than left hip pain.  He's had a CT scan which is reviewed.  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  I looked at the MRI scan from 2015 he does have some left-sided nerve compression from recurrent mild disc herniation.  He has had back surgery done years ago.  He is primarily here because he wants something for his pain.  He has been smoking marijuana to help with the pain.  Try tramadol without much relief.  Reports pain at rest and pain when he first stands walking and uses it some.  He has been checked for hernia and he does not have a hernia.  Recently had diminished feeling in the right foot which is treated with diuretic which helped.  He states though that his foot on the right still feels "heavy" is using a cane on the right-hand side              Review of Systems All systems reviewed are negative as they relate to the chief complaint within the history of present illness.  Patient denies  fevers or chills.    Assessment & Plan: Visit Diagnoses:  1. Other spondylosis with radiculopathy, lumbar region   2. Right hip pain   3. Left hip pain     Plan: Impression is right greater than left hip pain with no real significant arthritis present on CT scan.  He does not have a hernia.  He does have significant history of back surgery with subsequent sciatica going down the left leg as well as a little bit of dorsiflexion weakness on the right.  When asked if you would consider back surgery or injections for confirm pathology in the back he  said no.  I think however neurosurgical referral is reasonable at this time.  In regards to pain management I think that's something that we don't do here which would be long-term pain management which is what he would need.  We talked about potential MRI scan of the right hip just to confirm that there is no occult arthritis present but the CT scan and my opinion is about 95% confirmatory.  That he does not have surgical hip arthritis.  He also does not want to consider injections in the hip which would be both diagnostic and therapeutic.  We will see him back as needed but I think his next step would be neurosurgical referral and long-term pain management.  Does not appear to have a surgical problem in the hip and he is not considering  surgery for the back  Follow-Up Instructions: No Follow-up on file.   Orders:  No orders of the defined types were placed in this encounter.  No orders of the defined types were placed in this encounter.     Procedures: No procedures performed   Clinical Data: No additional findings.  Objective: Vital Signs: There were no vitals taken for this visit.  Physical Exam  Constitutional: He appears well-developed.  HENT:  Head: Normocephalic.  Eyes: EOM are normal.  Neck: Normal range of motion.  Cardiovascular: Normal rate.   Pulmonary/Chest: Effort normal.  Neurological: He is alert.  Skin: Skin is warm.  Psychiatric: He has a normal mood and affect.    Ortho Exam on a more thorough exam he does have 4 out of 5 dorsiflexion strength on the right versus left.  Has palpable pedal pulses.  Normal gait.  Is using a cane in the left hand.  There is no groin pain or in or restriction of motion in the right or left hip.  He has good flexion strength abduction and adduction strength.  Does have paresthesias on both the right than the left in the L5-S1 distribution.  Negative Babinski negative clonus  Specialty Comments:  No specialty comments  available.  Imaging: No results found.      PMFS History: Patient Active Problem List   Diagnosis Date Noted  . Hypertension 09/08/2013  . Sciatica 09/08/2013  . History of renal transplant 08/10/2013   Past Medical History:  Diagnosis Date  . Arthritis   . CHF (congestive heart failure) ([place])   . Chronic kidney disease   . Diabetes mellitus without complication ([place])   . Hypertension     No family history on file.  Past Surgical History:  Procedure Laterality Date  . KIDNEY TRANSPLANT    . SPINE SURGERY     Social History   Occupational History  . Not on file.   Social History Main Topics  . Smoking status: Never Smoker  . Smokeless tobacco: Never Used  . Alcohol use Yes  . Drug use:     Types: Marijuana  . Sexual activity: Not on file

## 2016-02-20 ENCOUNTER — Ambulatory Visit (HOSPITAL_BASED_OUTPATIENT_CLINIC_OR_DEPARTMENT_OTHER): Payer: Medicare (Managed Care) | Attending: Internal Medicine

## 2016-03-17 ENCOUNTER — Encounter: Payer: Self-pay | Admitting: Podiatry

## 2016-03-17 ENCOUNTER — Ambulatory Visit (INDEPENDENT_AMBULATORY_CARE_PROVIDER_SITE_OTHER): Payer: Medicare (Managed Care) | Admitting: Podiatry

## 2016-03-17 VITALS — BP 172/108 | HR 63 | Resp 14

## 2016-03-17 DIAGNOSIS — M79676 Pain in unspecified toe(s): Secondary | ICD-10-CM

## 2016-03-17 DIAGNOSIS — B351 Tinea unguium: Secondary | ICD-10-CM | POA: Diagnosis not present

## 2016-03-17 NOTE — Progress Notes (Signed)
Patient ID: Jacob Daniel, male   DOB: 1955-10-17, 61 y.o.   MRN: [id number]   Subjective: This patient presents today primarily complaining of a painful right hallux toenail warm comfortable and last month. Patient describes ongoing debridement by nurse at [facility]. He was last evaluated in office on 02/13/2015. Patient is diabetic with a history of renal transplant  Objective Orientated 3  Vascular: No peripheral edema noted bilaterally DP and PT pulses 2/4 bilaterally Capillary reflex immediate bilaterally  Neurological: Sensation to 10 g monofilament wire intact 4/5 bilaterally Vibratory sensation reactive bilaterally Ankle reflex equal and reactive bilaterally  Dermatological: Dry skin bilaterally No open skin lesions bilaterally The toenails are elongated, brittle, hypertrophic 6-10 The right hallux toenail is hypertrophic and incurvated without any erythema, edema, warmth, drainage  Musculoskeletal: HAV deformity right  Assessment: Diabetic without foot complications Painful mycotic toenails including right hallux toenail Callused nail groove right hallux  Plan: Debridement of toenails 6-10 mechanical and electrical without any bleeding Recommend application of Vaseline to callused nail margins of the right hallux daily until comfortable  Follow-up nail debridement at [facility] return when necessary or yearly

## 2016-03-17 NOTE — Progress Notes (Signed)
   Subjective:    Patient ID: Jacob Daniel, male    DOB: 02/10/1956, 61 y.o.   MRN: [id number]  HPI N-achy L- right foot; great toe D- 1 month O-suddenly C- worse A- socks T- none   Review of Systems  All other systems reviewed and are negative.      Objective:   Physical Exam        Assessment & Plan:

## 2016-03-17 NOTE — Patient Instructions (Signed)
Apply Vaseline to the edges of the right big toenail daily and cover with a Band-Aid until the callused nail groove softens Follow-up per patient at [facility]   Diabetes and Foot Care Diabetes may cause you to have problems because of poor blood supply (circulation) to your feet and legs. This may cause the skin on your feet to become thinner, break easier, and heal more slowly. Your skin may become dry, and the skin may peel and crack. You may also have nerve damage in your legs and feet causing decreased feeling in them. You may not notice minor injuries to your feet that could lead to infections or more serious problems. Taking care of your feet is one of the most important things you can do for yourself. Follow these instructions at home:  Wear shoes at all times, even in the house. Do not go barefoot. Bare feet are easily injured.  Check your feet daily for blisters, cuts, and redness. If you cannot see the bottom of your feet, use a mirror or ask someone for help.  Wash your feet with warm water (do not use hot water) and mild soap. Then pat your feet and the areas between your toes until they are completely dry. Do not soak your feet as this can dry your skin.  Apply a moisturizing lotion or petroleum jelly (that does not contain alcohol and is unscented) to the skin on your feet and to dry, brittle toenails. Do not apply lotion between your toes.  Trim your toenails straight across. Do not dig under them or around the cuticle. File the edges of your nails with an emery board or nail file.  Do not cut corns or calluses or try to remove them with medicine.  Wear clean socks or stockings every day. Make sure they are not too tight. Do not wear knee-high stockings since they may decrease blood flow to your legs.  Wear shoes that fit properly and have enough cushioning. To break in new shoes, wear them for just a few hours a day. This prevents you from injuring your feet. Always look in  your shoes before you put them on to be sure there are no objects inside.  Do not cross your legs. This may decrease the blood flow to your feet.  If you find a minor scrape, cut, or break in the skin on your feet, keep it and the skin around it clean and dry. These areas may be cleansed with mild soap and water. Do not cleanse the area with peroxide, alcohol, or iodine.  When you remove an adhesive bandage, be sure not to damage the skin around it.  If you have a wound, look at it several times a day to make sure it is healing.  Do not use heating pads or hot water bottles. They may burn your skin. If you have lost feeling in your feet or legs, you may not know it is happening until it is too late.  Make sure your health care provider performs a complete foot exam at least annually or more often if you have foot problems. Report any cuts, sores, or bruises to your health care provider immediately. Contact a health care provider if:  You have an injury that is not healing.  You have cuts or breaks in the skin.  You have an ingrown nail.  You notice redness on your legs or feet.  You feel burning or tingling in your legs or feet.  You  have pain or cramps in your legs and feet.  Your legs or feet are numb.  Your feet always feel cold. Get help right away if:  There is increasing redness, swelling, or pain in or around a wound.  There is a red line that goes up your leg.  Pus is coming from a wound.  You develop a fever or as directed by your health care provider.  You notice a bad smell coming from an ulcer or wound. This information is not intended to replace advice given to you by your health care provider. Make sure you discuss any questions you have with your health care provider. Document Released: 02/28/2000 Document Revised: 08/08/2015 Document Reviewed: 08/09/2012 Elsevier Interactive Patient Education  2017 [employer].

## 2016-04-02 ENCOUNTER — Encounter (HOSPITAL_BASED_OUTPATIENT_CLINIC_OR_DEPARTMENT_OTHER): Payer: Medicare (Managed Care)

## 2016-04-16 DIAGNOSIS — T8691 Unspecified transplanted organ and tissue rejection: Secondary | ICD-10-CM

## 2016-04-16 HISTORY — DX: Unspecified transplanted organ and tissue rejection: T86.91

## 2016-04-27 ENCOUNTER — Ambulatory Visit (HOSPITAL_BASED_OUTPATIENT_CLINIC_OR_DEPARTMENT_OTHER): Payer: Medicare (Managed Care) | Attending: Internal Medicine | Admitting: Internal Medicine

## 2016-04-27 VITALS — Ht 72.0 in | Wt 236.0 lb

## 2016-04-27 DIAGNOSIS — G4733 Obstructive sleep apnea (adult) (pediatric): Secondary | ICD-10-CM | POA: Diagnosis not present

## 2016-04-27 DIAGNOSIS — R0902 Hypoxemia: Secondary | ICD-10-CM | POA: Insufficient documentation

## 2016-04-27 DIAGNOSIS — Z8709 Personal history of other diseases of the respiratory system: Secondary | ICD-10-CM | POA: Diagnosis present

## 2016-05-09 DIAGNOSIS — G4733 Obstructive sleep apnea (adult) (pediatric): Secondary | ICD-10-CM

## 2016-05-09 NOTE — Procedures (Signed)
Patient Name: Jacob, Daniel Date: 04/27/2016 Gender: Male D.O.B: November 26, 1955 Age (years): 60 Referring Provider: Dorian Pod Height (inches): 32 Interpreting Physician: Baird Lyons MD, ABSM Weight (lbs): 236 RPSGT: Laren Everts BMI: 32 MRN: [id number] Neck Size: 16.75 CLINICAL INFORMATION Sleep Study Type: NPSG  Indication for sleep study: Excessive Daytime Sleepiness, Hypertension, Obesity, OSA, Snoring, Witnessed Apneas  Epworth Sleepiness Score: 4  SLEEP STUDY TECHNIQUE As per the AASM Manual for the Scoring of Sleep and Associated Events v2.3 (April 2016) with a hypopnea requiring 4% desaturations.  The channels recorded and monitored were frontal, central and occipital EEG, electrooculogram (EOG), submentalis EMG (chin), nasal and oral airflow, thoracic and abdominal wall motion, anterior tibialis EMG, snore microphone, electrocardiogram, and pulse oximetry.  MEDICATIONS Medications self-administered by patient taken the night of the study : CARVEDILOL, CELLCEPT, PROGRAF  SLEEP ARCHITECTURE The study was initiated at 9:54:07 PM and ended at 4:26:52 AM.  Sleep onset time was 21.3 minutes and the sleep efficiency was 56.4%. The total sleep time was 221.5 minutes.  Stage REM latency was 154.5 minutes.  The patient spent 67.49% of the night in stage N1 sleep, 30.93% in stage N2 sleep, 0.00% in stage N3 and 1.58% in REM.  Alpha intrusion was absent.  Supine sleep was 95.94%.  RESPIRATORY PARAMETERS The overall apnea/hypopnea index (AHI) was 78.0 per hour. There were 288 total apneas, including 217 obstructive, 3 central and 68 mixed apneas. There were 0 hypopneas and 1 RERAs.  The AHI during Stage REM sleep was 34.3 per hour.  AHI while supine was 78.5 per hour.  The mean oxygen saturation was 93.77%. The minimum SpO2 during sleep was 71.00%.  Moderate snoring was noted during this study.  CARDIAC DATA The 2 lead EKG demonstrated sinus rhythm. The  mean heart rate was 68.48 beats per minute. Other EKG findings include: PVCs.  LEG MOVEMENT DATA The total PLMS were 0 with a resulting PLMS index of 0.00. Associated arousal with leg movement index was 0.0 .  IMPRESSIONS - Severe obstructive sleep apnea occurred during this study (AHI = 78.0/h). - Patient refused CPAP trial. - No significant central sleep apnea occurred during this study (CAI = 0.8/h). - Moderate oxygen desaturation was noted during this study (Min O2 = 71.00%). - The patient snored with Moderate snoring volume. - EKG findings include PVCs. - Clinically significant periodic limb movements did not occur during sleep. No significant associated arousals.  DIAGNOSIS - Obstructive Sleep Apnea (327.23 [G47.33 ICD-10]) - Nocturnal Hypoxemia (327.26 [G47.36 ICD-10])  RECOMMENDATIONS - CPAP would be usual therapy of choice for scores in this range. The patient refused CPAP trial, according to tech concern was expressed that he might smother if power went off. Recommend careful education about CPAP, and perhaps daytime nap trial for [facility]ensit[facility] at [facility], which can be arranged by calling [facility]. Other treatment options may be considered, including ENT surgery or a fitted dental oral appliance.  - Positional therapy avoiding supine position during sleep. - Avoid alcohol, sedatives and other CNS depressants that may worsen sleep apnea and disrupt normal sleep architecture. - Sleep hygiene should be reviewed to assess factors that may improve sleep quality. - Weight management and regular exercise should be initiated or continued if appropriate.  [Electronically signed] 05/09/2016 07:46 PM  Baird Lyons MD, [place], American Board of Sleep Medicine   NPI: [id number]  [place], American Board of Sleep Medicine  ELECTRONICALLY SIGNED ON:  05/09/2016, 7:41 PM [place]  PH: [phone]   FX: [phone]  [phone] [place]

## 2016-07-20 ENCOUNTER — Ambulatory Visit: Payer: Medicare (Managed Care) | Admitting: Podiatry

## 2016-07-22 ENCOUNTER — Encounter: Payer: Self-pay | Admitting: Podiatry

## 2016-07-22 ENCOUNTER — Ambulatory Visit (INDEPENDENT_AMBULATORY_CARE_PROVIDER_SITE_OTHER): Payer: Medicare (Managed Care) | Admitting: Podiatry

## 2016-07-22 DIAGNOSIS — M79676 Pain in unspecified toe(s): Secondary | ICD-10-CM | POA: Diagnosis not present

## 2016-07-22 DIAGNOSIS — B351 Tinea unguium: Secondary | ICD-10-CM

## 2016-07-22 NOTE — Progress Notes (Signed)
Subjective:    Patient ID: Jacob Daniel, male   DOB: 61 y.o.   MRN: [id number]   HPI patient presents with significant thickness of underlying nailbeds and become painful and he cannot cut    ROS      Objective:  Physical Exam Neurovascular status unchanged with thick yellow brittle nailbeds that become painful 1-5 both feet    Assessment:    Patient of Dr. Amalia Hailey with thick yellow brittle nailbeds 1-5 both feet     Plan:    Debridement nailbeds 1-5 both feet no iatrogenic bleeding points to Dr. in 3 months

## 2016-07-22 NOTE — Patient Instructions (Signed)

## 2016-10-21 ENCOUNTER — Encounter: Payer: Self-pay | Admitting: Podiatry

## 2016-10-21 ENCOUNTER — Ambulatory Visit (INDEPENDENT_AMBULATORY_CARE_PROVIDER_SITE_OTHER): Payer: Medicare (Managed Care) | Admitting: Podiatry

## 2016-10-21 DIAGNOSIS — B351 Tinea unguium: Secondary | ICD-10-CM

## 2016-10-21 DIAGNOSIS — M79676 Pain in unspecified toe(s): Secondary | ICD-10-CM

## 2016-10-21 NOTE — Progress Notes (Signed)
Patient ID: Jacob Daniel, male   DOB: [birth year], 61 y.o.   MRN: [id number]    Subjective: This patient presents today primarily complaining of a painful toenails and walking wearing shoes. Patient has history of ongoing debridement by [facility] at [facility]. Today patient is requesting toenail debridement and is here from a schedule follow-up visit of May 19,2018 Patient is diabetic with a history of renal transplant  Objective Orientated 3  Vascular: No peripheral edema noted bilaterally DP and PT pulses 2/4 bilaterally Capillary reflex immediate bilaterally  Neurological: Sensation to 10 g monofilament wire intact 4/5 bilaterally Vibratory sensation reactive bilaterally Ankle reflex equal and reactive bilaterally  Dermatological: Dry skin bilaterally No open skin lesions bilaterally Absent hair growth bilaterally The toenails are elongated, brittle, hypertrophic 6-10 The toenails are elongated, incurvated, discolored and tender direct palpation 6-10  Musculoskeletal: HAV deformity right  Assessment: Diabetic without foot complications Symptomatic mycotic toenails 6-10   Plan: Debridement of toenails 6-10 mechanicaly and electrically without any bleeding  Reappoint 3 months

## 2016-10-21 NOTE — Patient Instructions (Addendum)
Return every 3 months for debridement of mycotic toenails Gean Birchwood DPM 10/21/2016   Diabetes and Foot Care Diabetes may cause you to have problems because of poor blood supply (circulation) to your feet and legs. This may cause the skin on your feet to become thinner, break easier, and heal more slowly. Your skin may become dry, and the skin may peel and crack. You may also have nerve damage in your legs and feet causing decreased feeling in them. You may not notice minor injuries to your feet that could lead to infections or more serious problems. Taking care of your feet is one of the most important things you can do for yourself. Follow these instructions at home:  Wear shoes at all times, even in the house. Do not go barefoot. Bare feet are easily injured.  Check your feet daily for blisters, cuts, and redness. If you cannot see the bottom of your feet, use a mirror or ask someone for help.  Wash your feet with warm water (do not use hot water) and mild soap. Then pat your feet and the areas between your toes until they are completely dry. Do not soak your feet as this can dry your skin.  Apply a moisturizing lotion or petroleum jelly (that does not contain alcohol and is unscented) to the skin on your feet and to dry, brittle toenails. Do not apply lotion between your toes.  Trim your toenails straight across. Do not dig under them or around the cuticle. File the edges of your nails with an emery board or nail file.  Do not cut corns or calluses or try to remove them with medicine.  Wear clean socks or stockings every day. Make sure they are not too tight. Do not wear knee-high stockings since they may decrease blood flow to your legs.  Wear shoes that fit properly and have enough cushioning. To break in new shoes, wear them for just a few hours a day. This prevents you from injuring your feet. Always look in your shoes before you put them on to be sure there are no objects  inside.  Do not cross your legs. This may decrease the blood flow to your feet.  If you find a minor scrape, cut, or break in the skin on your feet, keep it and the skin around it clean and dry. These areas may be cleansed with mild soap and water. Do not cleanse the area with peroxide, alcohol, or iodine.  When you remove an adhesive bandage, be sure not to damage the skin around it.  If you have a wound, look at it several times a day to make sure it is healing.  Do not use heating pads or hot water bottles. They may burn your skin. If you have lost feeling in your feet or legs, you may not know it is happening until it is too late.  Make sure your health care provider performs a complete foot exam at least annually or more often if you have foot problems. Report any cuts, sores, or bruises to your health care provider immediately. Contact a health care provider if:  You have an injury that is not healing.  You have cuts or breaks in the skin.  You have an ingrown nail.  You notice redness on your legs or feet.  You feel burning or tingling in your legs or feet.  You have pain or cramps in your legs and feet.  Your legs or feet are  numb.  Your feet always feel cold. Get help right away if:  There is increasing redness, swelling, or pain in or around a wound.  There is a red line that goes up your leg.  Pus is coming from a wound.  You develop a fever or as directed by your health care provider.  You notice a bad smell coming from an ulcer or wound. This information is not intended to replace advice given to you by your health care provider. Make sure you discuss any questions you have with your health care provider. Document Released: 02/28/2000 Document Revised: 08/08/2015 Document Reviewed: 08/09/2012 Elsevier Interactive Patient Education  2017 [employer].

## 2017-01-18 ENCOUNTER — Ambulatory Visit (INDEPENDENT_AMBULATORY_CARE_PROVIDER_SITE_OTHER): Payer: Medicare (Managed Care) | Admitting: Podiatry

## 2017-01-18 ENCOUNTER — Encounter: Payer: Self-pay | Admitting: Podiatry

## 2017-01-18 DIAGNOSIS — B351 Tinea unguium: Secondary | ICD-10-CM

## 2017-01-18 DIAGNOSIS — M79676 Pain in unspecified toe(s): Secondary | ICD-10-CM | POA: Diagnosis not present

## 2017-01-18 DIAGNOSIS — E119 Type 2 diabetes mellitus without complications: Secondary | ICD-10-CM | POA: Diagnosis not present

## 2017-01-18 NOTE — Progress Notes (Signed)
Patient ID: Jacob Daniel, male   DOB: 1955/04/17, 61 y.o.   MRN: [id number]    Subjective: This patient presents today primarily complaining of a painful toenails and walking wearing shoes. Patient has history of ongoing debridement by [facility] at [facility]. Today patient is requesting toenail debridement and is here from a schedule follow-up visit of May 19,2018 Patient is diabetic with a history of renal transplant  Objective Orientated 3  Vascular: No peripheral edema noted bilaterally DP and PT pulses 2/4 bilaterally Capillary reflex immediate bilaterally  Neurological: Sensation to 10 g monofilament wire intact 4/5 bilaterally Vibratory sensation reactive bilaterally Ankle reflex equal and reactive bilaterally  Dermatological: Dry skin bilaterally No open skin lesions bilaterally Absent hair growth bilaterally The toenails are elongated, brittle, hypertrophic 6-10 The toenails are elongated, incurvated, discolored and tender direct palpation 6-10  Musculoskeletal: HAV deformity right  Assessment: Diabetic without foot complications Symptomatic mycotic toenails 6-10   Plan: Debridement of toenails 6-10 mechanicaly and electrically without any bleeding  Reappoint 3 months

## 2017-01-18 NOTE — Patient Instructions (Signed)

## 2017-04-19 ENCOUNTER — Ambulatory Visit: Payer: Medicare (Managed Care) | Admitting: Podiatry

## 2017-04-22 ENCOUNTER — Encounter: Payer: Self-pay | Admitting: Podiatry

## 2017-04-22 ENCOUNTER — Ambulatory Visit (INDEPENDENT_AMBULATORY_CARE_PROVIDER_SITE_OTHER): Payer: Medicare (Managed Care) | Admitting: Podiatry

## 2017-04-22 DIAGNOSIS — M79674 Pain in right toe(s): Secondary | ICD-10-CM | POA: Diagnosis not present

## 2017-04-22 DIAGNOSIS — M79675 Pain in left toe(s): Secondary | ICD-10-CM

## 2017-04-22 DIAGNOSIS — B351 Tinea unguium: Secondary | ICD-10-CM | POA: Diagnosis not present

## 2017-04-22 NOTE — Progress Notes (Signed)
Subjective:   Patient ID: Jacob Daniel, male   DOB: 62 y.o.   MRN: [id number]   HPI Patient presents with severe elongation's thickness of the nails 1-5 both feet   ROS      Objective:  Physical Exam  Thick yellow brittle nailbeds 1-5 both feet with pain     Assessment:  Mycotic nail infection bilateral     Plan:  Debride painful nailbeds 1-5 both feet with no iatrogenic bleeding noted

## 2017-08-20 ENCOUNTER — Other Ambulatory Visit: Payer: Medicare (Managed Care) | Admitting: Podiatry

## 2017-08-22 NOTE — Progress Notes (Unsigned)
Subjective:   Patient ID: Jacob Daniel, male   DOB: 62 y.o.   MRN: [id number]   HPI Patient presents with elongated nailbeds 1-5 both feet that are painful and he cannot cut   ROS      Objective:  Physical Exam  Neurovascular status unchanged with thick yellow brittle nailbeds 1-5 both feet that are painful     Assessment:  Mycotic nail infection with pain 1-5 both feet     Plan:  Debride painful nailbeds 1-5 both feet with no iatrogenic bleeding noted

## 2017-09-08 DIAGNOSIS — M5441 Lumbago with sciatica, right side: Secondary | ICD-10-CM

## 2017-09-08 DIAGNOSIS — G8929 Other chronic pain: Secondary | ICD-10-CM | POA: Insufficient documentation

## 2017-09-08 DIAGNOSIS — M109 Gout, unspecified: Secondary | ICD-10-CM | POA: Insufficient documentation

## 2017-10-20 DIAGNOSIS — R195 Other fecal abnormalities: Secondary | ICD-10-CM | POA: Insufficient documentation

## 2017-10-20 DIAGNOSIS — Z791 Long term (current) use of non-steroidal anti-inflammatories (NSAID): Secondary | ICD-10-CM | POA: Insufficient documentation

## 2017-10-20 DIAGNOSIS — D649 Anemia, unspecified: Secondary | ICD-10-CM | POA: Insufficient documentation

## 2017-10-20 DIAGNOSIS — Z860100 Personal history of colon polyps, unspecified: Secondary | ICD-10-CM | POA: Insufficient documentation

## 2017-10-20 HISTORY — DX: Long term (current) use of non-steroidal anti-inflammatories (nsaid): Z79.1

## 2017-10-20 HISTORY — DX: Personal history of colon polyps, unspecified: Z86.0100

## 2017-10-20 HISTORY — DX: Other fecal abnormalities: R19.5

## 2017-12-23 ENCOUNTER — Encounter: Payer: Self-pay | Admitting: Podiatry

## 2017-12-23 ENCOUNTER — Ambulatory Visit (INDEPENDENT_AMBULATORY_CARE_PROVIDER_SITE_OTHER): Payer: Medicare Other | Admitting: Podiatry

## 2017-12-23 DIAGNOSIS — M79674 Pain in right toe(s): Secondary | ICD-10-CM | POA: Diagnosis not present

## 2017-12-23 DIAGNOSIS — E119 Type 2 diabetes mellitus without complications: Secondary | ICD-10-CM

## 2017-12-23 DIAGNOSIS — B351 Tinea unguium: Secondary | ICD-10-CM

## 2017-12-23 DIAGNOSIS — M79675 Pain in left toe(s): Secondary | ICD-10-CM | POA: Diagnosis not present

## 2017-12-23 NOTE — Progress Notes (Signed)
Patient presents with nail disease 1-5 both feet that are thick incurvated and he cannot cut with pain Subjective:   Patient ID: Jacob Daniel, male   DOB: 62 y.o.   MRN: [id number]   HPI above discussed   ROS      Objective:  Physical Exam  Neurovascular status intact with thick yellow brittle incurvated nails 1-5 both feet that are painful     Assessment:  Mycotic nail infection 1-5 both feet with pain     Plan:  Debride painful nailbeds 1-5 both feet with no iatrogenic bleeding noted

## 2018-04-08 ENCOUNTER — Ambulatory Visit: Payer: Medicare Other | Admitting: Podiatry

## 2018-04-28 DIAGNOSIS — M5416 Radiculopathy, lumbar region: Secondary | ICD-10-CM | POA: Insufficient documentation

## 2018-04-28 HISTORY — DX: Radiculopathy, lumbar region: M54.16

## 2018-05-11 ENCOUNTER — Ambulatory Visit (INDEPENDENT_AMBULATORY_CARE_PROVIDER_SITE_OTHER): Payer: Medicare Other | Admitting: Podiatry

## 2018-05-11 DIAGNOSIS — E119 Type 2 diabetes mellitus without complications: Secondary | ICD-10-CM

## 2018-05-11 DIAGNOSIS — B351 Tinea unguium: Secondary | ICD-10-CM | POA: Diagnosis not present

## 2018-05-11 DIAGNOSIS — M79675 Pain in left toe(s): Secondary | ICD-10-CM

## 2018-05-11 DIAGNOSIS — M79674 Pain in right toe(s): Secondary | ICD-10-CM | POA: Diagnosis not present

## 2018-05-11 NOTE — Patient Instructions (Addendum)
Onychomycosis/Fungal Toenails  WHAT IS IT? An infection that lies within the keratin of your nail plate that is caused by a fungus.  WHY ME? Fungal infections affect all ages, sexes, races, and creeds.  There may be many factors that predispose you to a fungal infection such as age, coexisting medical conditions such as diabetes, or an autoimmune disease; stress, medications, fatigue, genetics, etc.  Bottom line: fungus thrives in a warm, moist environment and your shoes offer such a location.  IS IT CONTAGIOUS? Theoretically, yes.  You do not want to share shoes, nail clippers or files with someone who has fungal toenails.  Walking around barefoot in the same room or sleeping in the same bed is unlikely to transfer the organism.  It is important to realize, however, that fungus can spread easily from one nail to the next on the same foot.  HOW DO WE TREAT THIS?  There are several ways to treat this condition.  Treatment may depend on many factors such as age, medications, pregnancy, liver and kidney conditions, etc.  It is best to ask your doctor which options are available to you.  1. No treatment.   Unlike many other medical concerns, you can live with this condition.  However for many people this can be a painful condition and may lead to ingrown toenails or a bacterial infection.  It is recommended that you keep the nails cut short to help reduce the amount of fungal nail. 2. Topical treatment.  These range from herbal remedies to prescription strength nail lacquers.  About 40-50% effective, topicals require twice daily application for approximately 9 to 12 months or until an entirely new nail has grown out.  The most effective topicals are medical grade medications available through physicians offices. 3. Oral antifungal medications.  With an 80-90% cure rate, the most common oral medication requires 3 to 4 months of therapy and stays in your system for a year as the new nail grows out.  Oral  antifungal medications do require blood work to make sure it is a safe drug for you.  A liver function panel will be performed prior to starting the medication and after the first month of treatment.  It is important to have the blood work performed to avoid any harmful side effects.  In general, this medication safe but blood work is required. 4. Laser Therapy.  This treatment is performed by applying a specialized laser to the affected nail plate.  This therapy is noninvasive, fast, and non-painful.  It is not covered by insurance and is therefore, out of pocket.  The results have been very good with a 80-95% cure rate.  [facility] is the only practice in the area to offer this therapy. Permanent Nail Avulsion.  Removing the entire nail so that a new nail will not grow back.

## 2018-05-17 ENCOUNTER — Encounter: Payer: Self-pay | Admitting: Podiatry

## 2018-05-17 NOTE — Progress Notes (Signed)
Subjective: KALONJI ZURAWSKI presents today for preventative diabetic foot with history of painful, thick toenails 1-5 b/l that he cannot cut and which interfere with daily activities.  Pain is aggravated when wearing enclosed shoe gear.  Patient had kidney transplant in 2010 and is taking Prograf.  Curt Jews, PA-C is his PCP.   Current Outpatient Medications:  .  pantoprazole (PROTONIX) 40 MG tablet, Take by mouth., Disp: , Rfl:  .  amLODipine (NORVASC) 5 MG tablet, Take 5 mg by mouth daily., Disp: , Rfl:  .  aspirin 81 MG tablet, Take 81 mg by mouth daily., Disp: , Rfl:  .  atorvastatin (LIPITOR) 10 MG tablet, , Disp: , Rfl:  .  carvedilol (COREG) 25 MG tablet, Take 25 mg by mouth 2 (two) times daily., Disp: , Rfl: 5 .  doxazosin (CARDURA) 4 MG tablet, Take 4 mg by mouth at bedtime., Disp: , Rfl: 5 .  GLIPIZIDE XL 5 MG 24 hr tablet, Take 5 mg by mouth daily., Disp: , Rfl: 5 .  hydrochlorothiazide (HYDRODIURIL) 25 MG tablet, , Disp: , Rfl:  .  losartan (COZAAR) 50 MG tablet, , Disp: , Rfl:  .  mycophenolate (CELLCEPT) 250 MG capsule, Take 750 mg by mouth 2 (two) times daily., Disp: , Rfl: 5 .  tacrolimus (PROGRAF) 1 MG capsule, TAKE AS DIRECTED 2 CAPSULES BY MOUTH EVERY MORNING AND 1 CAPSULE BY MOUTH EVERY EVENING, Disp: , Rfl: 5  No Known Allergies  Objective:  Vascular Examination: Capillary refill time immediate x 10 digits  Dorsalis pedis and Posterior tibial pulses palpable b/l  Digital hair absent x 10 digits  Skin temperature gradient WNL b/l  Dermatological Examination: Skin with normal turgor, texture and tone b/l  Toenails 1-5 b/l discolored, thick, dystrophic with subungual debris and pain with palpation to nailbeds due to thickness of nails.  No open wounds bilaterally.  No interdigital macerations noted bilaterally.  Musculoskeletal: Muscle strength 5/5 to all LE muscle groups  Hallux abductovalgus with bunion deformity noted bilaterally  No pain,  crepitus or joint limitation noted with ROM.   Neurological: Sensation intact with 10 gram monofilament. Vibratory sensation intact.  Assessment: Painful onychomycosis toenails 1-5 b/l  NIDDM  Plan: 1. Continue diabetic foot care principles.  Literature dispensed on today.   2. Toenails 1-5 b/l were debrided in length and girth without iatrogenic bleeding. 3. Patient to continue soft, supportive shoe gear daily. 4. Patient to report any pedal injuries to medical professional immediately. 5. Follow up 3 months.  6. Patient/POA to call should there be a concern in the interim.

## 2018-05-26 DIAGNOSIS — M25561 Pain in right knee: Secondary | ICD-10-CM | POA: Insufficient documentation

## 2018-05-26 DIAGNOSIS — G8929 Other chronic pain: Secondary | ICD-10-CM | POA: Insufficient documentation

## 2018-05-26 DIAGNOSIS — R2689 Other abnormalities of gait and mobility: Secondary | ICD-10-CM | POA: Insufficient documentation

## 2018-08-10 ENCOUNTER — Encounter: Payer: Self-pay | Admitting: Podiatry

## 2018-08-10 ENCOUNTER — Other Ambulatory Visit: Payer: Self-pay

## 2018-08-10 ENCOUNTER — Ambulatory Visit (INDEPENDENT_AMBULATORY_CARE_PROVIDER_SITE_OTHER): Payer: Medicare Other | Admitting: Podiatry

## 2018-08-10 VITALS — Temp 97.5°F

## 2018-08-10 DIAGNOSIS — M79674 Pain in right toe(s): Secondary | ICD-10-CM | POA: Diagnosis not present

## 2018-08-10 DIAGNOSIS — B351 Tinea unguium: Secondary | ICD-10-CM | POA: Diagnosis not present

## 2018-08-10 DIAGNOSIS — M79675 Pain in left toe(s): Secondary | ICD-10-CM | POA: Diagnosis not present

## 2018-08-10 DIAGNOSIS — E119 Type 2 diabetes mellitus without complications: Secondary | ICD-10-CM

## 2018-08-10 NOTE — Progress Notes (Addendum)
Subjective: Jacob Daniel presents for preventative diabetic foot care on  today with painful, thick toenails 1-5 b/l that he cannot cut and which interfere with daily activities.  Pain is aggravated when wearing enclosed shoe gear. Symptoms resolve with periodic professional debridement.  He states glucose was 80 gm/dl this morning.  Jacob Daniel, Meghan H, PA-C. Last visit was 05/27/2018 per patient recall.  Current Outpatient Medications:  .  amLODipine (NORVASC) 5 MG tablet, Take 5 mg by mouth daily., Disp: , Rfl:  .  aspirin 81 MG tablet, Take 81 mg by mouth daily., Disp: , Rfl:  .  atorvastatin (LIPITOR) 10 MG tablet, , Disp: , Rfl:  .  carvedilol (COREG) 25 MG tablet, Take 25 mg by mouth 2 (two) times daily., Disp: , Rfl: 5 .  doxazosin (CARDURA) 4 MG tablet, Take 4 mg by mouth at bedtime., Disp: , Rfl: 5 .  GLIPIZIDE XL 5 MG 24 hr tablet, Take 5 mg by mouth daily., Disp: , Rfl: 5 .  hydrochlorothiazide (HYDRODIURIL) 25 MG tablet, , Disp: , Rfl:  .  losartan (COZAAR) 50 MG tablet, , Disp: , Rfl:  .  mycophenolate (CELLCEPT) 250 MG capsule, Take 750 mg by mouth 2 (two) times daily., Disp: , Rfl: 5 .  pantoprazole (PROTONIX) 40 MG tablet, Take by mouth., Disp: , Rfl:  .  tacrolimus (PROGRAF) 1 MG capsule, TAKE AS DIRECTED 2 CAPSULES BY MOUTH EVERY MORNING AND 1 CAPSULE BY MOUTH EVERY EVENING, Disp: , Rfl: 5  No Known Allergies  Objective: Vitals:   08/10/18 0833  Temp: (!) 97.5 F (36.4 C)   Vascular Examination: Capillary refill time immediate x 10 digits.  Dorsalis pedis and Posterior tibial pulses palpable b/l.  Digital hair absent x 10 digits  Skin temperature gradient WNL b/l.  Dermatological Examination: Skin with normal turgor, texture and tone b/l.  Toenails 1-5 b/l discolored, thick, dystrophic with subungual debris and pain with palpation to nailbeds due to thickness of nails.  Musculoskeletal: Muscle strength 5/5 to all LE muscle groups b/l.  HAV with bunion  b/l.  No pain, crepitus or joint limitation noted with ROM.   Neurological: Sensation intact with 10 gram monofilament.  Vibratory sensation intact.  Assessment: Painful onychomycosis toenails 1-5 b/l NIDDM   Plan: 1. Continue diabetic foot care principles.  2. Toenails 1-5 b/l were debrided in length and girth without iatrogenic bleeding. 3. Patient to continue soft, supportive shoe gear daily. 4. Patient to report any pedal injuries to medical professional immediately. 5. Follow up 3 months.  6. Patient/POA to call should there be a concern in the interim.

## 2018-08-10 NOTE — Patient Instructions (Signed)
Diabetes Mellitus and Foot Care  Foot care is an important part of your health, especially when you have diabetes. Diabetes may cause you to have problems because of poor blood flow (circulation) to your feet and legs, which can cause your skin to:   Become thinner and drier.   Break more easily.   Heal more slowly.   Peel and crack.  You may also have nerve damage (neuropathy) in your legs and feet, causing decreased feeling in them. This means that you may not notice minor injuries to your feet that could lead to more serious problems. Noticing and addressing any potential problems early is the best way to prevent future foot problems.  How to care for your feet  Foot hygiene   Wash your feet daily with warm water and mild soap. Do not use hot water. Then, pat your feet and the areas between your toes until they are completely dry. Do not soak your feet as this can dry your skin.   Trim your toenails straight across. Do not dig under them or around the cuticle. File the edges of your nails with an emery board or nail file.   Apply a moisturizing lotion or petroleum jelly to the skin on your feet and to dry, brittle toenails. Use lotion that does not contain alcohol and is unscented. Do not apply lotion between your toes.  Shoes and socks   Wear clean socks or stockings every day. Make sure they are not too tight. Do not wear knee-high stockings since they may decrease blood flow to your legs.   Wear shoes that fit properly and have enough cushioning. Always look in your shoes before you put them on to be sure there are no objects inside.   To break in new shoes, wear them for just a few hours a day. This prevents injuries on your feet.  Wounds, scrapes, corns, and calluses   Check your feet daily for blisters, cuts, bruises, sores, and redness. If you cannot see the bottom of your feet, use a mirror or ask someone for help.   Do not cut corns or calluses or try to remove them with medicine.   If you  find a minor scrape, cut, or break in the skin on your feet, keep it and the skin around it clean and dry. You may clean these areas with mild soap and water. Do not clean the area with peroxide, alcohol, or iodine.   If you have a wound, scrape, corn, or callus on your foot, look at it several times a day to make sure it is healing and not infected. Check for:  ? Redness, swelling, or pain.  ? Fluid or blood.  ? Warmth.  ? Pus or a bad smell.  General instructions   Do not cross your legs. This may decrease blood flow to your feet.   Do not use heating pads or hot water bottles on your feet. They may burn your skin. If you have lost feeling in your feet or legs, you may not know this is happening until it is too late.   Protect your feet from hot and cold by wearing shoes, such as at the beach or on hot pavement.   Schedule a complete foot exam at least once a year (annually) or more often if you have foot problems. If you have foot problems, report any cuts, sores, or bruises to your health care provider immediately.  Contact a health care provider if:     You have a medical condition that increases your risk of infection and you have any cuts, sores, or bruises on your feet.   You have an injury that is not healing.   You have redness on your legs or feet.   You feel burning or tingling in your legs or feet.   You have pain or cramps in your legs and feet.   Your legs or feet are numb.   Your feet always feel cold.   You have pain around a toenail.  Get help right away if:   You have a wound, scrape, corn, or callus on your foot and:  ? You have pain, swelling, or redness that gets worse.  ? You have fluid or blood coming from the wound, scrape, corn, or callus.  ? Your wound, scrape, corn, or callus feels warm to the touch.  ? You have pus or a bad smell coming from the wound, scrape, corn, or callus.  ? You have a fever.  ? You have a red line going up your leg.  Summary   Check your feet every day  for cuts, sores, red spots, swelling, and blisters.   Moisturize feet and legs daily.   Wear shoes that fit properly and have enough cushioning.   If you have foot problems, report any cuts, sores, or bruises to your health care provider immediately.   Schedule a complete foot exam at least once a year (annually) or more often if you have foot problems.  This information is not intended to replace advice given to you by your health care provider. Make sure you discuss any questions you have with your health care provider.  Document Released: 02/28/2000 Document Revised: 04/14/2017 Document Reviewed: 04/03/2016  Elsevier Interactive Patient Education  2019 [employer]

## 2018-11-14 ENCOUNTER — Other Ambulatory Visit: Payer: Self-pay

## 2018-11-14 ENCOUNTER — Ambulatory Visit (INDEPENDENT_AMBULATORY_CARE_PROVIDER_SITE_OTHER): Payer: Medicare Other | Admitting: Podiatry

## 2018-11-14 ENCOUNTER — Encounter: Payer: Self-pay | Admitting: Podiatry

## 2018-11-14 DIAGNOSIS — B351 Tinea unguium: Secondary | ICD-10-CM

## 2018-11-14 DIAGNOSIS — M79675 Pain in left toe(s): Secondary | ICD-10-CM

## 2018-11-14 DIAGNOSIS — E1122 Type 2 diabetes mellitus with diabetic chronic kidney disease: Secondary | ICD-10-CM | POA: Diagnosis not present

## 2018-11-14 DIAGNOSIS — M79674 Pain in right toe(s): Secondary | ICD-10-CM

## 2018-11-14 NOTE — Patient Instructions (Signed)
Diabetes Mellitus and Foot Care Foot care is an important part of your health, especially when you have diabetes. Diabetes may cause you to have problems because of poor blood flow (circulation) to your feet and legs, which can cause your skin to:  Become thinner and drier.  Break more easily.  Heal more slowly.  Peel and crack. You may also have nerve damage (neuropathy) in your legs and feet, causing decreased feeling in them. This means that you may not notice minor injuries to your feet that could lead to more serious problems. Noticing and addressing any potential problems early is the best way to prevent future foot problems. How to care for your feet Foot hygiene  Wash your feet daily with warm water and mild soap. Do not use hot water. Then, pat your feet and the areas between your toes until they are completely dry. Do not soak your feet as this can dry your skin.  Trim your toenails straight across. Do not dig under them or around the cuticle. File the edges of your nails with an emery board or nail file.  Apply a moisturizing lotion or petroleum jelly to the skin on your feet and to dry, brittle toenails. Use lotion that does not contain alcohol and is unscented. Do not apply lotion between your toes. Shoes and socks  Wear clean socks or stockings every day. Make sure they are not too tight. Do not wear knee-high stockings since they may decrease blood flow to your legs.  Wear shoes that fit properly and have enough cushioning. Always look in your shoes before you put them on to be sure there are no objects inside.  To break in new shoes, wear them for just a few hours a day. This prevents injuries on your feet. Wounds, scrapes, corns, and calluses  Check your feet daily for blisters, cuts, bruises, sores, and redness. If you cannot see the bottom of your feet, use a mirror or ask someone for help.  Do not cut corns or calluses or try to remove them with medicine.  If you  find a minor scrape, cut, or break in the skin on your feet, keep it and the skin around it clean and dry. You may clean these areas with mild soap and water. Do not clean the area with peroxide, alcohol, or iodine.  If you have a wound, scrape, corn, or callus on your foot, look at it several times a day to make sure it is healing and not infected. Check for: ? Redness, swelling, or pain. ? Fluid or blood. ? Warmth. ? Pus or a bad smell. General instructions  Do not cross your legs. This may decrease blood flow to your feet.  Do not use heating pads or hot water bottles on your feet. They may burn your skin. If you have lost feeling in your feet or legs, you may not know this is happening until it is too late.  Protect your feet from hot and cold by wearing shoes, such as at the beach or on hot pavement.  Schedule a complete foot exam at least once a year (annually) or more often if you have foot problems. If you have foot problems, report any cuts, sores, or bruises to your health care provider immediately. Contact a health care provider if:  You have a medical condition that increases your risk of infection and you have any cuts, sores, or bruises on your feet.  You have an injury that is not   healing.  You have redness on your legs or feet.  You feel burning or tingling in your legs or feet.  You have pain or cramps in your legs and feet.  Your legs or feet are numb.  Your feet always feel cold.  You have pain around a toenail. Get help right away if:  You have a wound, scrape, corn, or callus on your foot and: ? You have pain, swelling, or redness that gets worse. ? You have fluid or blood coming from the wound, scrape, corn, or callus. ? Your wound, scrape, corn, or callus feels warm to the touch. ? You have pus or a bad smell coming from the wound, scrape, corn, or callus. ? You have a fever. ? You have a red line going up your leg. Summary  Check your feet every day  for cuts, sores, red spots, swelling, and blisters.  Moisturize feet and legs daily.  Wear shoes that fit properly and have enough cushioning.  If you have foot problems, report any cuts, sores, or bruises to your health care provider immediately.  Schedule a complete foot exam at least once a year (annually) or more often if you have foot problems. This information is not intended to replace advice given to you by your health care provider. Make sure you discuss any questions you have with your health care provider. Document Released: 02/28/2000 Document Revised: 04/14/2017 Document Reviewed: 04/03/2016 Elsevier Patient Education  2020 [employer] Inc[employer]   Onychomycosis/Fungal Toenails  WHAT IS IT? An infection that lies within the keratin of your nail plate that is caused by a fungus.  WHY ME? Fungal infections affect all ages, sexes, races, and creeds.  There may be many factors that predispose you to a fungal infection such as age, coexisting medical conditions such as diabetes, or an autoimmune disease; stress, medications, fatigue, genetics, etc.  Bottom line: fungus thrives in a warm, moist environment and your shoes offer such a location.  IS IT CONTAGIOUS? Theoretically, yes.  You do not want to share shoes, nail clippers or files with someone who has fungal toenails.  Walking around barefoot in the same room or sleeping in the same bed is unlikely to transfer the organism.  It is important to realize, however, that fungus can spread easily from one nail to the next on the same foot.  HOW DO WE TREAT THIS?  There are several ways to treat this condition.  Treatment may depend on many factors such as age, medications, pregnancy, liver and kidney conditions, etc.  It is best to ask your doctor which options are available to you.  1. No treatment.   Unlike many other medical concerns, you can live with this condition.  However for many people this can be a painful condition and may lead to  ingrown toenails or a bacterial infection.  It is recommended that you keep the nails cut short to help reduce the amount of fungal nail. 2. Topical treatment.  These range from herbal remedies to prescription strength nail lacquers.  About 40-50% effective, topicals require twice daily application for approximately 9 to 12 months or until an entirely new nail has grown out.  The most effective topicals are medical grade medications available through physicians offices. 3. Oral antifungal medications.  With an 80-90% cure rate, the most common oral medication requires 3 to 4 months of therapy and stays in your system for a year as the new nail grows out.  Oral antifungal medications do require   blood work to make sure it is a safe drug for you.  A liver function panel will be performed prior to starting the medication and after the first month of treatment.  It is important to have the blood work performed to avoid any harmful side effects.  In general, this medication safe but blood work is required. 4. Laser Therapy.  This treatment is performed by applying a specialized laser to the affected nail plate.  This therapy is noninvasive, fast, and non-painful.  It is not covered by insurance and is therefore, out of pocket.  The results have been very good with a 80-95% cure rate.  [facility] is the only practice in the area to offer this therapy. 5. Permanent Nail Avulsion.  Removing the entire nail so that a new nail will not grow back. 

## 2018-11-14 NOTE — Progress Notes (Signed)
Subjective:  Jacob Daniel presents to clinic today with cc of  painful, thick, discolored, elongated toenails 1-5 b/l that become tender and cannot cut because of thickness. Pain is aggravated when wearing enclosed shoe gear.  He states he will be traveling to [place] to visit his son.  He is expecting his first grandchild.  Patient states that he will be starting hemodialysis again soon.  He is to see his he is to see his kidney doctor next week.  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  He is also back on the transplant list.  Curt Jews, PA-C is his PCP.  Last visit December 07, 2017.   Current Outpatient Medications:  .  amLODipine (NORVASC) 5 MG tablet, Take 5 mg by mouth daily., Disp: , Rfl:  .  aspirin 81 MG tablet, Take 81 mg by mouth daily., Disp: , Rfl:  .  atorvastatin (LIPITOR) 10 MG tablet, , Disp: , Rfl:  .  carvedilol (COREG) 25 MG tablet, Take 25 mg by mouth 2 (two) times daily., Disp: , Rfl: 5 .  doxazosin (CARDURA) 4 MG tablet, Take 4 mg by mouth at bedtime., Disp: , Rfl: 5 .  GLIPIZIDE XL 5 MG 24 hr tablet, Take 5 mg by mouth daily., Disp: , Rfl: 5 .  hydrochlorothiazide (HYDRODIURIL) 25 MG tablet, , Disp: , Rfl:  .  losartan (COZAAR) 50 MG tablet, , Disp: , Rfl:  .  mycophenolate (CELLCEPT) 250 MG capsule, Take 750 mg by mouth 2 (two) times daily., Disp: , Rfl: 5 .  pantoprazole (PROTONIX) 40 MG tablet, Take by mouth., Disp: , Rfl:  .  tacrolimus (PROGRAF) 1 MG capsule, TAKE AS DIRECTED 2 CAPSULES BY MOUTH EVERY MORNING AND 1 CAPSULE BY MOUTH EVERY EVENING, Disp: , Rfl: 5   No Known Allergies   Objective:  Physical Examination:  Vascular Examination: Capillary refill time immediate x 10 digits.  Palpable DP/PT pulses b/l.  Digital hair absent bilaterally.  No edema noted b/l.  Skin temperature gradient WNL b/l.  Dermatological Examination: Skin with normal turgor, texture and tone b/l.  No open wounds b/l.  No interdigital  macerations noted b/l.  Elongated, thick, discolored brittle toenails with subungual debris and pain on dorsal palpation of nailbeds 1-5 b/l.  Musculoskeletal Examination: Muscle strength 5/5 to all muscle groups b/l.  Hallux abductovalgus with bunion deformity bilaterally.  No pain, crepitus or joint discomfort with active/passive ROM.  Neurological Examination: Sensation intact 5/5 b/l with 10 gram monofilament.  Vibratory sensation intact b/l.  Proprioceptive sensation intact b/l.  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 Continue diabetic foot care principles. 2. Toenails 1-5 b/l were debrided in length and girth without iatrogenic laceration. 2.  Continue soft, supportive shoe gear daily. 3.  Report any pedal injuries to medical professional. 4.  Follow up 3 months. 5.  Patient/POA to call should there be a question/concern in there interim.

## 2018-12-01 ENCOUNTER — Other Ambulatory Visit: Payer: Self-pay | Admitting: Chiropractor

## 2018-12-01 DIAGNOSIS — M25531 Pain in right wrist: Secondary | ICD-10-CM

## 2018-12-01 DIAGNOSIS — G8929 Other chronic pain: Secondary | ICD-10-CM

## 2018-12-21 ENCOUNTER — Ambulatory Visit
Admission: RE | Admit: 2018-12-21 | Discharge: 2018-12-21 | Disposition: A | Discharge status: Home / Self Care | Payer: PRIVATE HEALTH INSURANCE | Source: Ambulatory Visit | Attending: Chiropractor | Admitting: Chiropractor

## 2018-12-21 ENCOUNTER — Other Ambulatory Visit: Payer: Self-pay

## 2018-12-21 DIAGNOSIS — M25531 Pain in right wrist: Secondary | ICD-10-CM

## 2018-12-21 DIAGNOSIS — M25561 Pain in right knee: Secondary | ICD-10-CM

## 2018-12-21 DIAGNOSIS — G8929 Other chronic pain: Secondary | ICD-10-CM

## 2019-02-06 ENCOUNTER — Ambulatory Visit: Payer: Medicare Other | Admitting: Podiatry

## 2019-06-01 ENCOUNTER — Ambulatory Visit: Payer: Medicare Other | Admitting: Podiatry

## 2019-07-21 ENCOUNTER — Telehealth: Payer: Self-pay | Admitting: Nurse Practitioner

## 2019-07-21 NOTE — Telephone Encounter (Signed)
Patient called to be screened for possible home bound Covid vaccine. Patient states that he has already been scheduled through senior services to get his vaccine.

## 2019-08-02 ENCOUNTER — Other Ambulatory Visit: Payer: Self-pay

## 2019-08-02 ENCOUNTER — Ambulatory Visit (INDEPENDENT_AMBULATORY_CARE_PROVIDER_SITE_OTHER): Payer: Medicare Other | Admitting: Podiatry

## 2019-08-02 ENCOUNTER — Encounter: Payer: Self-pay | Admitting: Podiatry

## 2019-08-02 ENCOUNTER — Ambulatory Visit (INDEPENDENT_AMBULATORY_CARE_PROVIDER_SITE_OTHER): Payer: Medicare Other

## 2019-08-02 DIAGNOSIS — M79672 Pain in left foot: Secondary | ICD-10-CM

## 2019-08-02 DIAGNOSIS — M79674 Pain in right toe(s): Secondary | ICD-10-CM

## 2019-08-02 DIAGNOSIS — M779 Enthesopathy, unspecified: Secondary | ICD-10-CM | POA: Diagnosis not present

## 2019-08-02 DIAGNOSIS — M1000 Idiopathic gout, unspecified site: Secondary | ICD-10-CM

## 2019-08-02 DIAGNOSIS — M79671 Pain in right foot: Secondary | ICD-10-CM

## 2019-08-02 DIAGNOSIS — M21619 Bunion of unspecified foot: Secondary | ICD-10-CM

## 2019-08-02 DIAGNOSIS — B351 Tinea unguium: Secondary | ICD-10-CM

## 2019-08-02 DIAGNOSIS — M79675 Pain in left toe(s): Secondary | ICD-10-CM | POA: Diagnosis not present

## 2019-08-02 NOTE — Patient Instructions (Signed)
 Gout  Gout is painful swelling of your joints. Gout is a type of arthritis. It is caused by having too much uric acid in your body. Uric acid is a chemical that is made when your body breaks down substances called purines. If your body has too much uric acid, sharp crystals can form and build up in your joints. This causes pain and swelling. Gout attacks can happen quickly and be very painful (acute gout). Over time, the attacks can affect more joints and happen more often (chronic gout). What are the causes?  Too much uric acid in your blood. This can happen because: ? Your kidneys do not remove enough uric acid from your blood. ? Your body makes too much uric acid. ? You eat too many foods that are high in purines. These foods include organ meats, some seafood, and beer.  Trauma or stress. What increases the risk?  Having a family history of gout.  Being male and middle-aged.  Being male and having gone through menopause.  Being very overweight (obese).  Drinking alcohol, especially beer.  Not having enough water in the body (being dehydrated).  Losing weight too quickly.  Having an organ transplant.  Having lead poisoning.  Taking certain medicines.  Having kidney disease.  Having a skin condition called psoriasis. What are the signs or symptoms? An attack of acute gout usually happens in just one joint. The most common place is the big toe. Attacks often start at night. Other joints that may be affected include joints of the feet, ankle, knee, fingers, wrist, or elbow. Symptoms of an attack may include:  Very bad pain.  Warmth.  Swelling.  Stiffness.  Shiny, red, or purple skin.  Tenderness. The affected joint may be very painful to touch.  Chills and fever. Chronic gout may cause symptoms more often. More joints may be involved. You may also have white or yellow lumps (tophi) on your hands or feet or in other areas near your joints. How is this  treated?  Treatment for this condition has two phases: treating an acute attack and preventing future attacks.  Acute gout treatment may include: ? NSAIDs. ? Steroids. These are taken by mouth or injected into a joint. ? Colchicine. This medicine relieves pain and swelling. It can be given by mouth or through an IV tube.  Preventive treatment may include: ? Taking small doses of NSAIDs or colchicine daily. ? Using a medicine that reduces uric acid levels in your blood. ? Making changes to your diet. You may need to see a food expert (dietitian) about what to eat and drink to prevent gout. Follow these instructions at home: During a gout attack   If told, put ice on the painful area: ? Put ice in a plastic bag. ? Place a towel between your skin and the bag. ? Leave the ice on for 20 minutes, 2-3 times a day.  Raise (elevate) the painful joint above the level of your heart as often as you can.  Rest the joint as much as possible. If the joint is in your leg, you may be given crutches.  Follow instructions from your doctor about what you cannot eat or drink. Avoiding future gout attacks  Eat a low-purine diet. Avoid foods and drinks such as: ? Liver. ? Kidney. ? Anchovies. ? Asparagus. ? Herring. ? Mushrooms. ? Mussels. ? Beer.  Stay at a healthy weight. If you want to lose weight, talk with your doctor. Do not lose   weight too fast.  Start or continue an exercise plan as told by your doctor. Eating and drinking  Drink enough fluids to keep your pee (urine) pale yellow.  If you drink alcohol: ? Limit how much you use to:  0-1 drink a day for women.  0-2 drinks a day for men. ? Be aware of how much alcohol is in your drink. In the [place] one drink equals one 12 oz bottle of beer (355 mL), one 5 oz glass of wine (148 mL), or one 1 oz glass of hard liquor (44 mL). General instructions  Take over-the-counter and prescription medicines only as told by your doctor.  Do  not drive or use heavy machinery while taking prescription pain medicine.  Return to your normal activities as told by your doctor. Ask your doctor what activities are safe for you.  Keep all follow-up visits as told by your doctor. This is important. Contact a doctor if:  You have another gout attack.  You still have symptoms of a gout attack after 10 days of treatment.  You have problems (side effects) because of your medicines.  You have chills or a fever.  You have burning pain when you pee (urinate).  You have pain in your lower back or belly. Get help right away if:  You have very bad pain.  Your pain cannot be controlled.  You cannot pee. Summary  Gout is painful swelling of the joints.  The most common site of pain is the big toe, but it can affect other joints.  Medicines and avoiding some foods can help to prevent and treat gout attacks. This information is not intended to replace advice given to you by your health care provider. Make sure you discuss any questions you have with your health care provider. Document Revised: 09/22/2017 Document Reviewed: 09/22/2017 Elsevier Patient Education  2020 [employer] Inc.  

## 2019-08-03 ENCOUNTER — Other Ambulatory Visit: Payer: Self-pay | Admitting: Podiatry

## 2019-08-03 DIAGNOSIS — M1000 Idiopathic gout, unspecified site: Secondary | ICD-10-CM

## 2019-08-03 DIAGNOSIS — M779 Enthesopathy, unspecified: Secondary | ICD-10-CM

## 2019-08-03 NOTE — Progress Notes (Signed)
Subjective:   Patient ID: Jacob Daniel, male   DOB: 64 y.o.   MRN: [id number]   HPI Patient presents stating that he has had a flareup of gout in both feet and took prednisone which helped temporarily but it is now come back and is now in his midfoot versus the big toe joints.  States is very painful and that he is not currently taking medications.  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  I did send for blood work arthritic profile and today I did a midfoot medial injection 3 mg Kenalog 5 mg Xylocaine to reduce the inflammation and will see back when we get results  X-rays indicated there is significant structural bunion deformity with lysis bilateral but it appears to be more of a long-term issue versus the acute situation he is in

## 2019-09-12 ENCOUNTER — Ambulatory Visit: Payer: Medicare Other | Admitting: Podiatry

## 2019-09-14 ENCOUNTER — Telehealth: Payer: Self-pay | Admitting: Podiatry

## 2019-09-14 NOTE — Telephone Encounter (Signed)
Pt called to see if he could get another medication for the gout

## 2019-09-21 NOTE — Telephone Encounter (Signed)
I would check on him and we could consider colchicine

## 2019-09-22 NOTE — Telephone Encounter (Signed)
[name] phone rang for over 1 minute without answering service.

## 2019-10-04 DIAGNOSIS — M722 Plantar fascial fibromatosis: Secondary | ICD-10-CM | POA: Insufficient documentation

## 2019-10-04 DIAGNOSIS — I5022 Chronic systolic (congestive) heart failure: Secondary | ICD-10-CM | POA: Insufficient documentation

## 2019-10-04 DIAGNOSIS — G4733 Obstructive sleep apnea (adult) (pediatric): Secondary | ICD-10-CM

## 2019-10-04 DIAGNOSIS — N183 Chronic kidney disease, stage 3 unspecified: Secondary | ICD-10-CM | POA: Insufficient documentation

## 2019-10-04 DIAGNOSIS — E872 Acidosis, unspecified: Secondary | ICD-10-CM | POA: Insufficient documentation

## 2019-10-04 DIAGNOSIS — F1010 Alcohol abuse, uncomplicated: Secondary | ICD-10-CM | POA: Insufficient documentation

## 2019-10-04 DIAGNOSIS — I5020 Unspecified systolic (congestive) heart failure: Secondary | ICD-10-CM | POA: Insufficient documentation

## 2019-10-04 DIAGNOSIS — I43 Cardiomyopathy in diseases classified elsewhere: Secondary | ICD-10-CM | POA: Insufficient documentation

## 2019-10-04 DIAGNOSIS — F1210 Cannabis abuse, uncomplicated: Secondary | ICD-10-CM | POA: Insufficient documentation

## 2019-10-04 DIAGNOSIS — E669 Obesity, unspecified: Secondary | ICD-10-CM

## 2019-10-04 DIAGNOSIS — Z992 Dependence on renal dialysis: Secondary | ICD-10-CM | POA: Insufficient documentation

## 2019-10-04 DIAGNOSIS — N186 End stage renal disease: Secondary | ICD-10-CM | POA: Insufficient documentation

## 2019-10-04 HISTORY — DX: Acidosis, unspecified: E87.20

## 2019-10-04 HISTORY — DX: Hypomagnesemia: E83.42

## 2019-10-04 HISTORY — DX: Obesity, unspecified: E66.9

## 2019-10-04 HISTORY — DX: Obstructive sleep apnea (adult) (pediatric): G47.33

## 2019-11-03 ENCOUNTER — Ambulatory Visit: Payer: Medicare Other | Admitting: Podiatry

## 2019-11-23 DIAGNOSIS — N529 Male erectile dysfunction, unspecified: Secondary | ICD-10-CM | POA: Insufficient documentation

## 2020-01-17 ENCOUNTER — Emergency Department (HOSPITAL_COMMUNITY): Payer: Medicare Other

## 2020-01-17 ENCOUNTER — Observation Stay (HOSPITAL_COMMUNITY)
Admission: EM | Admit: 2020-01-17 | Discharge: 2020-01-18 | Disposition: A | Discharge status: Home / Self Care | Payer: Medicare Other | Attending: Internal Medicine | Admitting: Internal Medicine

## 2020-01-17 ENCOUNTER — Other Ambulatory Visit: Payer: Self-pay

## 2020-01-17 ENCOUNTER — Encounter (HOSPITAL_COMMUNITY): Payer: Self-pay

## 2020-01-17 DIAGNOSIS — R0602 Shortness of breath: Secondary | ICD-10-CM

## 2020-01-17 DIAGNOSIS — J811 Chronic pulmonary edema: Secondary | ICD-10-CM

## 2020-01-17 DIAGNOSIS — E8770 Fluid overload, unspecified: Secondary | ICD-10-CM | POA: Insufficient documentation

## 2020-01-17 DIAGNOSIS — I5033 Acute on chronic diastolic (congestive) heart failure: Secondary | ICD-10-CM | POA: Insufficient documentation

## 2020-01-17 DIAGNOSIS — Z86010 Personal history of colonic polyps: Secondary | ICD-10-CM | POA: Insufficient documentation

## 2020-01-17 DIAGNOSIS — Z7982 Long term (current) use of aspirin: Secondary | ICD-10-CM | POA: Diagnosis not present

## 2020-01-17 DIAGNOSIS — N186 End stage renal disease: Secondary | ICD-10-CM | POA: Insufficient documentation

## 2020-01-17 DIAGNOSIS — E119 Type 2 diabetes mellitus without complications: Secondary | ICD-10-CM | POA: Diagnosis not present

## 2020-01-17 DIAGNOSIS — Z992 Dependence on renal dialysis: Secondary | ICD-10-CM | POA: Diagnosis not present

## 2020-01-17 DIAGNOSIS — Z20822 Contact with and (suspected) exposure to covid-19: Secondary | ICD-10-CM | POA: Diagnosis not present

## 2020-01-17 DIAGNOSIS — Z79899 Other long term (current) drug therapy: Secondary | ICD-10-CM | POA: Insufficient documentation

## 2020-01-17 DIAGNOSIS — R0902 Hypoxemia: Secondary | ICD-10-CM | POA: Diagnosis not present

## 2020-01-17 DIAGNOSIS — Z7984 Long term (current) use of oral hypoglycemic drugs: Secondary | ICD-10-CM | POA: Diagnosis not present

## 2020-01-17 DIAGNOSIS — I132 Hypertensive heart and chronic kidney disease with heart failure and with stage 5 chronic kidney disease, or end stage renal disease: Secondary | ICD-10-CM | POA: Diagnosis not present

## 2020-01-17 HISTORY — DX: Chronic pulmonary edema: J81.1

## 2020-01-17 LAB — CBC WITH DIFFERENTIAL/PLATELET
Abs Immature Granulocytes: 0.06 10*3/uL (ref 0.00–0.07)
Basophils Absolute: 0 10*3/uL (ref 0.0–0.1)
Basophils Relative: 2 %
Eosinophils Absolute: 0.1 10*3/uL (ref 0.0–0.5)
Eosinophils Relative: 6 %
HCT: 33.8 % — ABNORMAL LOW (ref 39.0–52.0)
Hemoglobin: 10.3 g/dL — ABNORMAL LOW (ref 13.0–17.0)
Immature Granulocytes: 3 %
Lymphocytes Relative: 33 %
Lymphs Abs: 0.7 10*3/uL (ref 0.7–4.0)
MCH: 25.6 pg — ABNORMAL LOW (ref 26.0–34.0)
MCHC: 30.5 g/dL (ref 30.0–36.0)
MCV: 84.1 fL (ref 80.0–100.0)
Monocytes Absolute: 0.5 10*3/uL (ref 0.1–1.0)
Monocytes Relative: 25 %
Neutro Abs: 0.6 10*3/uL — ABNORMAL LOW (ref 1.7–7.7)
Neutrophils Relative %: 31 %
Platelets: 231 10*3/uL (ref 150–400)
RBC: 4.02 MIL/uL — ABNORMAL LOW (ref 4.22–5.81)
RDW: 13.9 % (ref 11.5–15.5)
WBC: 2 10*3/uL — ABNORMAL LOW (ref 4.0–10.5)
nRBC: 0 % (ref 0.0–0.2)

## 2020-01-17 LAB — RESPIRATORY PANEL BY RT PCR (FLU A&B, COVID)
Influenza A by PCR: NEGATIVE
Influenza B by PCR: NEGATIVE
SARS Coronavirus 2 by RT PCR: NEGATIVE

## 2020-01-17 LAB — COMPREHENSIVE METABOLIC PANEL
ALT: 10 U/L (ref 0–44)
AST: 11 U/L — ABNORMAL LOW (ref 15–41)
Albumin: 3.3 g/dL — ABNORMAL LOW (ref 3.5–5.0)
Alkaline Phosphatase: 60 U/L (ref 38–126)
Anion gap: 14 (ref 5–15)
BUN: 38 mg/dL — ABNORMAL HIGH (ref 8–23)
CO2: 21 mmol/L — ABNORMAL LOW (ref 22–32)
Calcium: 8.6 mg/dL — ABNORMAL LOW (ref 8.9–10.3)
Chloride: 100 mmol/L (ref 98–111)
Creatinine, Ser: 9.59 mg/dL — ABNORMAL HIGH (ref 0.61–1.24)
GFR, Estimated: 6 mL/min — ABNORMAL LOW (ref >60)
Glucose, Bld: 139 mg/dL — ABNORMAL HIGH (ref 70–99)
Potassium: 3.7 mmol/L (ref 3.5–5.1)
Sodium: 135 mmol/L (ref 135–145)
Total Bilirubin: 1 mg/dL (ref 0.3–1.2)
Total Protein: 7 g/dL (ref 6.5–8.1)

## 2020-01-17 LAB — HEPATITIS B SURFACE ANTIGEN: Hepatitis B Surface Ag: NONREACTIVE

## 2020-01-17 LAB — HIV ANTIBODY (ROUTINE TESTING W REFLEX): HIV Screen 4th Generation wRfx: NONREACTIVE

## 2020-01-17 MED ORDER — HYDROCHLOROTHIAZIDE 25 MG PO TABS: 25.0000 mg | ORAL_TABLET | Freq: Every day | ORAL | Status: DC

## 2020-01-17 MED ORDER — AMLODIPINE BESYLATE 5 MG PO TABS
15.0000 mg | ORAL_TABLET | Freq: Once | ORAL | Status: AC
  Administered 2020-01-17: 15 mg via ORAL
  Filled 2020-01-17: qty 3

## 2020-01-17 MED ORDER — CARVEDILOL 3.125 MG PO TABS: 25.0000 mg | ORAL_TABLET | Freq: Two times a day (BID) | ORAL | Status: DC

## 2020-01-17 MED ORDER — TACROLIMUS 1 MG PO CAPS
4.0000 mg | ORAL_CAPSULE | Freq: Two times a day (BID) | ORAL | Status: DC
  Administered 2020-01-17 – 2020-01-18 (×3): 4 mg via ORAL
  Filled 2020-01-17 (×4): qty 4

## 2020-01-17 MED ORDER — MYCOPHENOLATE MOFETIL 250 MG PO CAPS
750.0000 mg | ORAL_CAPSULE | Freq: Two times a day (BID) | ORAL | Status: DC
  Administered 2020-01-17 – 2020-01-18 (×3): 750 mg via ORAL
  Filled 2020-01-17 (×4): qty 3

## 2020-01-17 MED ORDER — ACETAMINOPHEN 650 MG RE SUPP: 650.0000 mg | Freq: Four times a day (QID) | RECTAL | Status: DC | PRN

## 2020-01-17 MED ORDER — AMLODIPINE BESYLATE 10 MG PO TABS
10.0000 mg | ORAL_TABLET | Freq: Every day | ORAL | Status: DC
  Administered 2020-01-18: 10 mg via ORAL
  Filled 2020-01-17: qty 1

## 2020-01-17 MED ORDER — MAGNESIUM OXIDE 400 MG PO TABS
400.0000 mg | ORAL_TABLET | Freq: Every day | ORAL | Status: DC
  Filled 2020-01-17 (×2): qty 1

## 2020-01-17 MED ORDER — CARVEDILOL 25 MG PO TABS
25.0000 mg | ORAL_TABLET | Freq: Two times a day (BID) | ORAL | Status: DC
  Administered 2020-01-17 – 2020-01-18 (×2): 25 mg via ORAL
  Filled 2020-01-17: qty 1
  Filled 2020-01-17: qty 8

## 2020-01-17 MED ORDER — HEPARIN SODIUM (PORCINE) 5000 UNIT/ML IJ SOLN
5000.0000 [IU] | Freq: Three times a day (TID) | INTRAMUSCULAR | Status: DC
  Administered 2020-01-17 – 2020-01-18 (×2): 5000 [IU] via SUBCUTANEOUS
  Filled 2020-01-17 (×2): qty 1

## 2020-01-17 MED ORDER — HEPARIN SODIUM (PORCINE) 1000 UNIT/ML DIALYSIS: 20.0000 [IU]/kg | INTRAMUSCULAR | Status: DC | PRN

## 2020-01-17 MED ORDER — CALCIUM ACETATE (PHOS BINDER) 667 MG PO CAPS
667.0000 mg | ORAL_CAPSULE | Freq: Every day | ORAL | Status: DC
  Administered 2020-01-18: 667 mg via ORAL
  Filled 2020-01-17 (×2): qty 1

## 2020-01-17 MED ORDER — ACETAMINOPHEN 325 MG PO TABS: 650.0000 mg | ORAL_TABLET | Freq: Four times a day (QID) | ORAL | Status: DC | PRN

## 2020-01-17 MED ORDER — DOXAZOSIN MESYLATE 4 MG PO TABS: 4.0000 mg | ORAL_TABLET | Freq: Every day | ORAL | Status: DC

## 2020-01-17 MED ORDER — SODIUM BICARBONATE 650 MG PO TABS
650.0000 mg | ORAL_TABLET | Freq: Two times a day (BID) | ORAL | Status: DC
  Administered 2020-01-17 – 2020-01-18 (×3): 650 mg via ORAL
  Filled 2020-01-17 (×4): qty 1

## 2020-01-17 MED ORDER — ASPIRIN EC 81 MG PO TBEC
81.0000 mg | DELAYED_RELEASE_TABLET | Freq: Every day | ORAL | Status: DC
  Administered 2020-01-17 – 2020-01-18 (×2): 81 mg via ORAL
  Filled 2020-01-17 (×2): qty 1

## 2020-01-17 MED ORDER — CHLORHEXIDINE GLUCONATE CLOTH 2 % EX PADS
6.0000 | MEDICATED_PAD | Freq: Every day | CUTANEOUS | Status: DC
  Administered 2020-01-18: 6 via TOPICAL

## 2020-01-17 MED ORDER — ATORVASTATIN CALCIUM 10 MG PO TABS
10.0000 mg | ORAL_TABLET | Freq: Every day | ORAL | Status: DC
  Administered 2020-01-17 – 2020-01-18 (×2): 10 mg via ORAL
  Filled 2020-01-17 (×2): qty 1

## 2020-01-17 MED ORDER — POLYETHYLENE GLYCOL 3350 17 G PO PACK: 17.0000 g | PACK | Freq: Every day | ORAL | Status: DC | PRN

## 2020-01-17 MED ORDER — TACROLIMUS 1 MG PO CAPS
4.0000 mg | ORAL_CAPSULE | Freq: Two times a day (BID) | ORAL | Status: DC
  Filled 2020-01-17 (×2): qty 4

## 2020-01-17 MED ORDER — TACROLIMUS 1 MG PO CAPS: 1.0000 mg | ORAL_CAPSULE | Freq: Two times a day (BID) | ORAL | Status: DC

## 2020-01-17 MED ORDER — MAGNESIUM OXIDE 400 (241.3 MG) MG PO TABS
400.0000 mg | ORAL_TABLET | Freq: Every day | ORAL | Status: DC
  Administered 2020-01-17 – 2020-01-18 (×2): 400 mg via ORAL
  Filled 2020-01-17: qty 1

## 2020-01-17 MED ORDER — AMLODIPINE BESYLATE 5 MG PO TABS: 10.0000 mg | ORAL_TABLET | Freq: Every day | ORAL | Status: DC

## 2020-01-17 NOTE — ED Triage Notes (Addendum)
Per [facility]: Pt complained of shortness of breath. Fire found pt to be 82% on room air, placed on 15 L NRB and increased to 94% then up to 100%. Pt ambulated to stretcher from EMS stretcher and had increased shortness of breath and found to be 86% on room air upon arrival. Pt placed on 5 L [place] on arrival. Pt did not go to dialysis because he was too short of breath. Pts last full dialysis session was 01-15-2020. Pt is right arm restricted. Pt was not able to take his BP meds this AM.

## 2020-01-17 NOTE — ED Notes (Signed)
Yellow socks and yellow band applied to pt.

## 2020-01-17 NOTE — ED Provider Notes (Signed)
[facility] EMERGENCY DEPARTMENT Provider Note   CSN: [id number] Arrival date & time: 01/17/20  0827     History Chief Complaint  Patient presents with  . Shortness of Breath    Jacob Daniel is a 64 y.o. male.  The history is provided by the patient and the EMS personnel. The history is limited by the condition of the patient. No language interpreter was used.  Shortness of Breath Severity:  Severe Onset quality:  Gradual Duration:  1 day Timing:  Constant Progression:  Partially resolved (with oxygen) Chronicity:  New Context: not URI   Relieved by:  Oxygen Worsened by:  Activity Ineffective treatments:  None tried Associated symptoms: no abdominal pain, no chest pain, no claudication, no cough, no diaphoresis, no fever, no headaches, no neck pain, no rash, no sputum production, no vomiting and no wheezing   Risk factors: no hx of PE/DVT        Past Medical History:  Diagnosis Date  . Arthritis   . CHF (congestive heart failure) ([place])   . Chronic kidney disease   . Diabetes mellitus without complication ([place])   . Hypertension     Patient Active Problem List   Diagnosis Date Noted  . Balance problem 05/26/2018  . Chronic pain of right knee 05/26/2018  . Right lumbar radiculitis 04/28/2018  . Anemia 10/20/2017  . History of colon polyps 10/20/2017  . NSAID long-term use 10/20/2017  . Positive fecal occult blood test 10/20/2017  . Acute gout 09/08/2017  . Chronic bilateral low back pain with right-sided sciatica 09/08/2017  . 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 Hypertension 09/08/2013  . Sciatica 09/08/2013  . History of renal transplant 08/10/2013  . Type 2 diabetes mellitus with complications ([place]) 81/44/8185    Past Surgical History:  Procedure Laterality Date  . KIDNEY TRANSPLANT    . SPINE SURGERY         No family history on file.  Social  History   Tobacco Use  . Smoking status: Never Smoker  . Smokeless tobacco: Never Used  Substance Use Topics  . Alcohol use: Yes  . Drug use: Yes    Types: Marijuana    Home Medications Prior to Admission medications   Medication Sig Start Date End Date Taking? Authorizing Provider  amLODipine (NORVASC) 10 MG tablet Take 10 mg by mouth daily. 07/06/19   [provider]  amLODipine (NORVASC) 5 MG tablet Take 5 mg by mouth daily.    [provider]  aspirin 81 MG tablet Take 81 mg by mouth daily.    [provider]  atorvastatin (LIPITOR) 10 MG tablet  05/02/18   [provider]  calcium acetate (PHOSLO) 667 MG capsule Take 667 mg by mouth 3 (three) times daily. 07/11/19   [provider]  carvedilol (COREG) 25 MG tablet Take 25 mg by mouth 2 (two) times daily. 10/08/14   [provider]  doxazosin (CARDURA) 4 MG tablet Take 4 mg by mouth at bedtime. 10/08/14   [provider]  ferrous sulfate 325 (65 FE) MG EC tablet Take by mouth. 10/01/17   [provider]  GLIPIZIDE XL 5 MG 24 hr tablet Take 5 mg by mouth daily. 10/08/14   [provider]  hydrochlorothiazide (HYDRODIURIL) 25 MG tablet  05/02/18   [provider]  losartan (COZAAR) 50 MG tablet  05/02/18   [provider]  magnesium oxide (MAG-OX) 400  MG tablet Take 1 tablet by mouth daily. 07/14/19   [provider]  mycophenolate (CELLCEPT) 250 MG capsule Take 750 mg by mouth 2 (two) times daily. 10/08/14   [provider]  pantoprazole (PROTONIX) 40 MG tablet Take by mouth. 04/29/18   [provider]  sodium bicarbonate 650 MG tablet Take 650 mg by mouth 2 (two) times daily. 07/26/19   [provider]  tacrolimus (PROGRAF) 1 MG capsule TAKE AS DIRECTED 2 CAPSULES BY MOUTH EVERY MORNING AND 1 CAPSULE BY MOUTH EVERY EVENING 10/08/14   [provider]    Allergies    Patient has no known  allergies.  Review of Systems   Review of Systems  Constitutional: Positive for fatigue. Negative for chills, diaphoresis and fever.  HENT: Negative for congestion.   Eyes: Negative for visual disturbance.  Respiratory: Positive for shortness of breath. Negative for cough, sputum production, choking, chest tightness, wheezing and stridor.   Cardiovascular: Negative for chest pain, palpitations, claudication and leg swelling.  Gastrointestinal: Negative for abdominal pain, constipation, diarrhea, nausea and vomiting.  Genitourinary: Negative for flank pain.  Musculoskeletal: Negative for back pain, neck pain and neck stiffness.  Skin: Negative for rash.  Neurological: Negative for light-headedness and headaches.  Psychiatric/Behavioral: Negative for agitation.  All other systems reviewed and are negative.   Physical Exam Updated Vital Signs BP (!) 177/101   Pulse 79   Temp 97.8 F (36.6 C) (Oral)   Resp 19   SpO2 98%   Physical Exam Vitals and nursing note reviewed.  Constitutional:      General: He is not in acute distress.    Appearance: He is well-developed. He is ill-appearing. He is not toxic-appearing or diaphoretic.     Interventions: He is not intubated. HENT:     Head: Normocephalic and atraumatic.     Mouth/Throat:     Pharynx: No pharyngeal swelling or oropharyngeal exudate.  Eyes:     Conjunctiva/sclera: Conjunctivae normal.     Pupils: Pupils are equal, round, and reactive to light.  Cardiovascular:     Rate and Rhythm: Normal rate and regular rhythm.  No extrasystoles are present.    Heart sounds: No murmur heard.   Pulmonary:     Effort: Tachypnea and accessory muscle usage present. No respiratory distress. He is not intubated.     Breath sounds: No stridor. Rales present. No decreased breath sounds, wheezing or rhonchi.  Chest:     Chest wall: No tenderness.  Abdominal:     Palpations: Abdomen is soft.     Tenderness: There is no abdominal tenderness.   Musculoskeletal:     Cervical back: Normal range of motion and neck supple.     Right lower leg: No tenderness. No edema.     Left lower leg: No tenderness. No edema.  Skin:    General: Skin is warm and dry.     Capillary Refill: Capillary refill takes less than 2 seconds.     Findings: No erythema.  Neurological:     General: No focal deficit present.     Mental Status: He is alert.  Psychiatric:        Mood and Affect: Mood is anxious.     ED Results / Procedures / Treatments   Labs (all labs ordered are listed, but only abnormal results are displayed) Labs Reviewed  CBC WITH DIFFERENTIAL/PLATELET - Abnormal; Notable for the following components:      Result Value   WBC 2.0 (*)  RBC 4.02 (*)    Hemoglobin 10.3 (*)    HCT 33.8 (*)    MCH 25.6 (*)    All other components within normal limits  COMPREHENSIVE METABOLIC PANEL - Abnormal; Notable for the following components:   CO2 21 (*)    Glucose, Bld 139 (*)    BUN 38 (*)    Creatinine, Ser 9.59 (*)    Calcium 8.6 (*)    Albumin 3.3 (*)    AST 11 (*)    GFR, Estimated 6 (*)    All other components within normal limits  RESPIRATORY PANEL BY RT PCR (FLU A&B, COVID)    EKG EKG Interpretation  Date/Time:  Wednesday January 17 2020 08:33:24 EDT Ventricular Rate:  83 PR Interval:    QRS Duration: 100 QT Interval:  440 QTC Calculation: 518 R Axis:   64 Text Interpretation: Sinus rhythm Atrial premature complex Minimal ST depression, lateral leads Prolonged QT interval When compared to prior, more wandering baseline and artifact. No STEMI Confirmed by Antony Blackbird [phone]) on 01/17/2020 8:35:06 AM   Radiology DG Chest Portable 1 View  Result Date: 01/17/2020 CLINICAL DATA:  Dialysis, shortness of breath with hypoxia EXAM: PORTABLE CHEST 1 VIEW COMPARISON:  No prior images available for comparison FINDINGS: Trachea midline. Cardiomediastinal contours are normal accounting for portable AP technique. No lobar level  consolidative changes. No sign of pleural effusion. Increased density in the RIGHT lower and mid chest. This blends with the RIGHT infrahilar region. On limited assessment no acute skeletal process. Generalized increased interstitial markings throughout the chest. IMPRESSION: 1. Generalized increased interstitial markings throughout the chest may represent atypical infection or asymmetric edema. 2. Added density at the RIGHT lung base may represent asymmetric edema or developing pneumonia. Follow-up PA and lateral chest is suggested to ensure resolution following resolution of symptoms. Electronically Signed   By: Zetta Bills M.D.   On: 01/17/2020 09:14    Procedures Procedures (including critical care time)  CRITICAL CARE Performed by: Gwenyth Allegra Denajah Farias Total critical care time: 30 minutes Critical care time was exclusive of separately billable procedures and treating other patients. Hypoxia with fluid overload needing emergent dialysis today. Critical care was necessary to treat or prevent imminent or life-threatening deterioration. Critical care was time spent personally by me on the following activities: development of treatment plan with patient and/or surrogate as well as nursing, discussions with consultants, evaluation of patient's response to treatment, examination of patient, obtaining history from patient or surrogate, ordering and performing treatments and interventions, ordering and review of laboratory studies, ordering and review of radiographic studies, pulse oximetry and re-evaluation of patient's condition.   Medications Ordered in ED Medications  carvedilol (COREG) tablet 25 mg (25 mg Oral Given 01/17/20 0901)  Chlorhexidine Gluconate Cloth 2 % PADS 6 each (has no administration in time range)  amLODipine (NORVASC) tablet 15 mg (15 mg Oral Given 01/17/20 0901)    ED Course  I have reviewed the triage vital signs and the nursing notes.  Pertinent labs & imaging results  that were available during my care of the patient were reviewed by me and considered in my medical decision making (see chart for details).    MDM Rules/Calculators/A&P                          CARRICK RIJOS is a 64 y.o. male with a past medical history significant for prior renal transplant, hypertension, diabetes, CHF, and ESRD on  dialysis M WF who has not missed any dialysis sessions who presents for shortness of breath and hypoxia.  According to patient, he was found to be hypoxic with shortness of breath today with oxygen saturation 80% on room air.  He was with good dialysis posterior short of breath.  He was brought in on 15 L nonrebreather and increase his oxygen saturations into the 90s.  He was taken off of oxygen to reassess and his saturations dropped into the mid 80s and he had increased shortness of breath and could not speak well.  He is now on 5 L nasal cannula maintaining his oxygen saturations in the low 90s.  He reports no chest pain and did not miss any recent dialysis sessions.  He reports no fevers, chills, ingestion, or cough.  Denies mops this.  Denies any leg pain or leg swelling reports he typically does not get fluid in his legs.  He reports has not taken his blood pressure medicines for him because he was too short of breath.  He denies any nausea, vomiting, abdominal pain, back pain, or other complaints.  He is still short of breath.  On exam, lungs have rales in all lung fields.  Chest is nontender.  No murmur.  Abdomen nontender.  Patient is resting now he is on oxygen.  He is still tachypneic but is improving.  Clinically I am concerned of fluid overload.  We will get screening labs and a chest x-ray and EKG.  EKG appears similar to prior with more artifact and wandering baseline.  Anticipate touching base with nephrology team after labs are collected to discuss emergent dialysis needs given the new oxygen requirement.  Will discuss if he will be a candidate for dialysis  and discharge if his oxygen saturations improve versus needing admission given his more complicated picture with his prior transplantation.  We will give him his home blood pressure medicine the morning which is 15 mg of Norvasc and 25 of carvedilol.  Patient confirmed this what he takes in the mornings to try to help his blood pressure.  If needed, will give IV blood pressure control as well.  Anticipate reassessment.  Patient's work-up shows mild anemia and low white blood cell count her reasoning is present.  Covid and flu test negative.  Metabolic panel is elevated creatinine as expected with his dialysis needs.  Potassium is normal.  X-ray shows evidence of edema versus atypical infection.  Given the clinical context, suspect edema.  We'll call nephrology to come see patient for likely emergent dialysis and will discuss further management after.  Spoke with nephrology just now and they request to be admitted to medicine for dialysis and further evaluation and lab trending.  Will call for admission   Final Clinical Impression(s) / ED Diagnoses Final diagnoses:  Hypoxia  SOB (shortness of breath)  Hypervolemia, unspecified hypervolemia type    Rx / DC Orders ED Discharge Orders    None     Clinical Impression: 1. Hypoxia   2. SOB (shortness of breath)   3. Hypervolemia, unspecified hypervolemia type     Disposition: Admit  This note was prepared with assistance of Dragon voice recognition software. Occasional wrong-word or sound-a-like substitutions may have occurred due to the inherent limitations of voice recognition software.     Delvon Chipps, Gwenyth Allegra, MD 01/17/20 1620

## 2020-01-17 NOTE — H&P (Signed)
Date: 01/17/2020               Patient Name:  Jacob Daniel MRN: [id number]  DOB: 1955/07/17 Age / Sex: 64 y.o., male   PCP: Jacob Jews, PA-C         Medical Service: Internal Medicine Teaching Service         Attending Physician: Dr. Aldine Contes, MD    First Contact: Dr. Wynetta Daniel Pager: [phone]  Second Contact: Dr. Darrick Daniel Pager: [phone]       After Hours (After 5p/  First Contact Pager: [phone]  weekends / holidays): Second Contact Pager: [phone]   Chief Complaint: Shortness of breath  History of Present Illness:  [name] is a 64 yo M w/ PMH of HTN, ESRD s/p transplant, anemia presenting to [facility] with complaint of shortness of breath. He was in his usual state of health until this morning when he woke up with shortness of breath and weakness. He mentions he felt 'difficulty catching his breath' which was worsened with laying flat. He goes to dialysis MWF and states he had a normal dialysis session on Monday. He denies prior episode of similar symptoms. He is endorsing cough with clear sputum. He denies any heavy salt intake.  On review of systems, he denies any fevers, chills, nausea, vomiting, chest pain, palpitations, lower extremity swelling, diarrhea, constipation.  Meds:  Amlodipine 10mg  daily Atorvastatin 10mg  daily Carvedilol 25mg  BID Doxazosin 4mg  qhs Mag-Ox 400mg  daily Mycophenolate 750mg  BID Sodium bicarbonate 650mg  BID  Allergies: Allergies as of 01/17/2020  . (No Known Allergies)   Past Medical History:  Diagnosis Date  . Arthritis   . CHF (congestive heart failure) ([place])   . Chronic kidney disease   . Diabetes mellitus without complication ([place])   . Hypertension     Family History:  Mother and sister, who is in [place], also is on dialysis  Social History: Lives at home, currently divorced. Goes to dialysis MWF. Denies any tobacco, illicit substance use. Drinks some beer occasionally.  Review of Systems: A complete ROS was negative  except as per HPI.  Physical Exam: Blood pressure 133/89, pulse 74, temperature 97.8 F (36.6 C), temperature source Oral, resp. rate (!) 24, SpO2 97 %.  Gen: Well-developed, well nourished, NAD HEENT: NCAT head, hearing intact, EOMI Neck: supple, ROM intact, no JVD CV: RRR, S1, S2 normal, No rubs, no murmurs, no gallops Pulm: Bibasilar rales up to mid thorax Abd: Soft, BS+, NTND, No rebound, no guarding Extm: Stable RUE graft with + bruit, trace ankle edema Skin: Dry, Warm, normal turgor, well-healed surgical scars Neuro: AAOx3  EKG: personally reviewed my interpretation is normal sinus with PACs, no ischemic changes  CXR: personally reviewed my interpretation is increased vascular congestion, no pleural effusion or lobar opacity  Assessment & Plan by Problem: Active Problems:   Pulmonary edema  [name] is a 64 yo M w/ PMH of ESRD s/p failed transplant, HFpEF, HTN presenting with dyspnea due to pulmonary edema from ESRD.  ESRD Acute on chronic diastolic heart failure Presenting with dyspnea and orthopnea. 82% on room air. Currently on 5L [place] and titrating down satting 99. Chest X-ray w/ pulmonary edema and questionable RLL opacity. Regularly going to dialysis MWF. Prior Echo on 2017 with EF 55-60 w/ grade 2 diastolic dysfunction. No obvious inciting event. Denies dietary indiscretion but possibly salt intake. Possibly need lower dry weight at dialysis. Nephrology planning inpatient dialysis. - Appreciate nephro recs: Dialysis today, possibly serial dialysis -  Repeat X-ray after dialysis - I&Os - Daily weights - Fluid restriction - Keep O2 sat >88  Neutropenia Hx of renal transplant On admission wbc 2.0 with reduced neutrophil count. Had renal transplant 7 years prior for ESRD but had worsening of renal fx and restarted dialysis earlier this year. On prograf and mycophenolate for immuno suppression. Neutropenia likely due to immunosuppressive drugs. Monitor for signs of  infection - Trend cbc - C/w home meds  Anemia of chronic disease Hgb 10.3. MCV 84. Normocytic anemia consistent with anemia of chronic disease.  - Trend cbc - F/u with outpatient nephrology  HTN  Mentions hx of uncontrolled htn leading to renal failure. On amlodipine, carvedilol, doxazosin. Current bp 146/93. Should improve with dialysis - C/w home meds  DVT prophx: subqhep Diet: Renal Bowel: MIralax Code: Full  Prior to Admission Living Arrangement: Home Anticipated Discharge Location: Home Barriers to Discharge: medical tx  Dispo: Admit patient to Observation with expected length of stay less than 2 midnights.  Signed: Mosetta Anis, MD 01/17/2020, 1:31 PM  Pager: [phone]

## 2020-01-17 NOTE — ED Notes (Signed)
Received pt into room 48 in [facility]. Hemodialysis notified, states that pt will be on 2nd or 3rd run for dialysis.

## 2020-01-17 NOTE — ED Notes (Signed)
Provider at bedside

## 2020-01-17 NOTE — Consult Note (Signed)
[facility] Renal Consultation Note    Indication for Consultation:  Management of ESRD/hemodialysis, anemia, hypertension/volume, and secondary hyperparathyroidism.  HPI: Jacob Daniel is a 64 y.o. male with PMH including ESRD on dialysis MWF, CHF, DM, and HTN, who presented to the ED with SOB. He reporst he was getting ready for dialysis this morning and suddenly felt very dizzy. He stopped to rest andrealized he was not breathing well, so his family advised him to call EMS. He was originally started on dialysis in 2008 but then received a transplant. The transplant eventually failed and he reports he has been back on dialysis for about 7 months. He still takes prograf and cellcept and reports his prograf level has been checked regularly and has been in range. He has been compliant with dialysis and was meeting his dry weight. Appetite is stable and he does not think he lost any body weight. Pt denies CP, palpitations, abdominal pain, N/V/D. No recent fevers or chills. No hx blood clots. He does have a history of tobacco use. He has received 2 doses of a covid 19 vaccine.   O2 saturation was 80% on RA with EMS and he and was placed on 15L nonrebreather, but was able to be transitioned to 5L [place]. Labs notable for K+ 3.7, BUN 38, Cr 9.59, Ca 8.6, WBC 2.0, Hgb 10.3, Plt 231. CXR reviewed and appears consistent with pulmonary edema.   Past Medical History:  Diagnosis Date  . Arthritis   . CHF (congestive heart failure) ([place])   . Chronic kidney disease   . Diabetes mellitus without complication ([place])   . Hypertension    Past Surgical History:  Procedure Laterality Date  . KIDNEY TRANSPLANT  2010  . SPINE SURGERY     No family history on file. Social History:  reports that he has never smoked. He has never used smokeless tobacco. He reports current alcohol use of about 1.0 standard drink of alcohol per week. He reports current drug use. Drug: Marijuana.  ROS: As per HPI otherwise  negative.  Physical Exam: Vitals:   01/17/20 0900 01/17/20 0901 01/17/20 0915 01/17/20 0930  BP: (!) 168/104 (!) 168/104 (!) 159/91 (!) 158/98  Pulse: 79 82 80 82  Resp: 20  (!) 25 (!) 26  Temp:      TempSrc:      SpO2: 96%  98% 98%     General: Well developed, alert in no acute distress. Head: Normocephalic, atraumatic, sclera non-icteric, mucus membranes are moist. Neck: JVD not elevated. Lungs: + expiratory crackles RLL, decreased air movement bilaterally. On O2 5L [place] Heart: RRR with normal S1, S2. No murmurs, rubs, or gallops appreciated. Abdomen: Soft, non-tender, non-distended with normoactive bowel sounds. No rebound/guarding. No obvious abdominal masses. Musculoskeletal:  Strength and tone appear normal for age. Lower extremities: No edema or ischemic changes, no open wounds. Neuro: Alert and oriented X 3. Moves all extremities spontaneously. Psych:  Responds to questions appropriately with a normal affect. Dialysis Access: RUE AVF aneurysmal, + thrill and bruit  No Known Allergies Prior to Admission medications   Medication Sig Start Date End Date Taking? Authorizing Provider  amLODipine (NORVASC) 10 MG tablet Take 10 mg by mouth daily. 07/06/19   [provider]  amLODipine (NORVASC) 5 MG tablet Take 5 mg by mouth daily.    [provider]  aspirin 81 MG tablet Take 81 mg by mouth daily.    [provider]  atorvastatin (LIPITOR) 10 MG tablet  05/02/18   [provider]  calcium acetate (PHOSLO) 667 MG capsule Take 667 mg by mouth 3 (three) times daily. 07/11/19   [provider]  carvedilol (COREG) 25 MG tablet Take 25 mg by mouth 2 (two) times daily. 10/08/14   [provider]  doxazosin (CARDURA) 4 MG tablet Take 4 mg by mouth at bedtime. 10/08/14   [provider]  ferrous sulfate 325 (65 FE) MG EC tablet Take by mouth. 10/01/17   [provider]  GLIPIZIDE XL 5 MG 24 hr tablet Take 5 mg by mouth daily.  10/08/14   [provider]  hydrochlorothiazide (HYDRODIURIL) 25 MG tablet  05/02/18   [provider]  losartan (COZAAR) 50 MG tablet  05/02/18   [provider]  magnesium oxide (MAG-OX) 400 MG tablet Take 1 tablet by mouth daily. 07/14/19   [provider]  mycophenolate (CELLCEPT) 250 MG capsule Take 750 mg by mouth 2 (two) times daily. 10/08/14   [provider]  pantoprazole (PROTONIX) 40 MG tablet Take by mouth. 04/29/18   [provider]  sodium bicarbonate 650 MG tablet Take 650 mg by mouth 2 (two) times daily. 07/26/19   [provider]  tacrolimus (PROGRAF) 1 MG capsule TAKE AS DIRECTED 2 CAPSULES BY MOUTH EVERY MORNING AND 1 CAPSULE BY MOUTH EVERY EVENING 10/08/14   [provider]   Current Facility-Administered Medications  Medication Dose Route Frequency Provider Last Rate Last Admin  . carvedilol (COREG) tablet 25 mg  25 mg Oral BID WC Tegeler, Gwenyth Allegra, MD   25 mg at 01/17/20 0901   Current Outpatient Medications  Medication Sig Dispense Refill  . amLODipine (NORVASC) 10 MG tablet Take 10 mg by mouth daily.    Marland Kitchen amLODipine (NORVASC) 5 MG tablet Take 5 mg by mouth daily.    Marland Kitchen aspirin 81 MG tablet Take 81 mg by mouth daily.    Marland Kitchen atorvastatin (LIPITOR) 10 MG tablet     . calcium acetate (PHOSLO) 667 MG capsule Take 667 mg by mouth 3 (three) times daily.    . carvedilol (COREG) 25 MG tablet Take 25 mg by mouth 2 (two) times daily.  5  . doxazosin (CARDURA) 4 MG tablet Take 4 mg by mouth at bedtime.  5  . ferrous sulfate 325 (65 FE) MG EC tablet Take by mouth.    Marland Kitchen GLIPIZIDE XL 5 MG 24 hr tablet Take 5 mg by mouth daily.  5  . hydrochlorothiazide (HYDRODIURIL) 25 MG tablet     . losartan (COZAAR) 50 MG tablet     . magnesium oxide (MAG-OX) 400 MG tablet Take 1 tablet by mouth daily.    . mycophenolate (CELLCEPT) 250 MG capsule Take 750 mg by mouth 2 (two) times daily.  5  . pantoprazole (PROTONIX) 40 MG  tablet Take by mouth.    . sodium bicarbonate 650 MG tablet Take 650 mg by mouth 2 (two) times daily.    . tacrolimus (PROGRAF) 1 MG capsule TAKE AS DIRECTED 2 CAPSULES BY MOUTH EVERY MORNING AND 1 CAPSULE BY MOUTH EVERY EVENING  5   Labs: Basic Metabolic Panel: Recent Labs  Lab 01/17/20 0836  NA 135  K 3.7  CL 100  CO2 21*  GLUCOSE 139*  BUN 38*  CREATININE 9.59*  CALCIUM 8.6*   Liver Function Tests: Recent Labs  Lab 01/17/20 0836  AST 11*  ALT 10  ALKPHOS 60  BILITOT 1.0  PROT 7.0  ALBUMIN 3.3*  CBC: Recent Labs  Lab 01/17/20 0836  WBC 2.0*  NEUTROABS PENDING  HGB 10.3*  HCT 33.8*  MCV 84.1  PLT 231   Studies/Results: DG Chest Portable 1 View  Result Date: 01/17/2020 CLINICAL DATA:  Dialysis, shortness of breath with hypoxia EXAM: PORTABLE CHEST 1 VIEW COMPARISON:  No prior images available for comparison FINDINGS: Trachea midline. Cardiomediastinal contours are normal accounting for portable AP technique. No lobar level consolidative changes. No sign of pleural effusion. Increased density in the RIGHT lower and mid chest. This blends with the RIGHT infrahilar region. On limited assessment no acute skeletal process. Generalized increased interstitial markings throughout the chest. IMPRESSION: 1. Generalized increased interstitial markings throughout the chest may represent atypical infection or asymmetric edema. 2. Added density at the RIGHT lung base may represent asymmetric edema or developing pneumonia. Follow-up PA and lateral chest is suggested to ensure resolution following resolution of symptoms. Electronically Signed   By: Zetta Bills M.D.   On: 01/17/2020 09:14    Dialysis Orders: From care everywhere: NEPHROLOGIST: Gifford Shave MD  LOCATION: [facility]  SCHEDULE: M-W-F 2nd Shift  CHAIR:  EDW: 190 kg.  KIDNEY: [facility] F200N  HD TIME: 204  ACCESS: R Forearm AVF  NEEDLE SIZE: 17 ga.  ANTICOAG: Standard Heparin Per Protocol 4100/1000   BATH: 3K-HCO3  QB: 360 QD: 500   Assessment/Plan: 1.  Shortness of breath: Acute onset this morning. O2 sat 80% on room air, now stable on O2. CXR is consistent with pulmonary edema. Will plan for HD today with UF goal 4L, may require serial dialysis depending on clinical course. Will continue to follow. 2.  ESRD:  Dialyzes at [facility] on MWF schedule. Hx kidney transplant as above. HD today per regular schedule 3. History of kidney transplant: Received kidney transplant in 09/2008 which ultimately failed about 7 months ago per pt. Remains on prograf and cellcept 4.  Hypertension/volume: BP elevated and volume overloaded as above. Anticipate improvement with ultrafiltration. 5.  Anemia: Hgb at goal. No ESA indicated at this time 6.  Metabolic bone disease: Calcium controlled. Continue phosphrus binder, calcium acetate, and follow phosphorus level. 7.  Nutrition:  Renal diet/fluid restrictions recommended  Anice Paganini, PA-C 01/17/2020, 9:59 AM  [facility] Pager: [phone]

## 2020-01-17 NOTE — ED Notes (Signed)
Dietary notified - lunch ordered

## 2020-01-18 DIAGNOSIS — N186 End stage renal disease: Secondary | ICD-10-CM

## 2020-01-18 DIAGNOSIS — R0902 Hypoxemia: Secondary | ICD-10-CM | POA: Diagnosis not present

## 2020-01-18 DIAGNOSIS — D709 Neutropenia, unspecified: Secondary | ICD-10-CM

## 2020-01-18 DIAGNOSIS — I5033 Acute on chronic diastolic (congestive) heart failure: Secondary | ICD-10-CM

## 2020-01-18 LAB — CBC
HCT: 30 % — ABNORMAL LOW (ref 39.0–52.0)
Hemoglobin: 9.5 g/dL — ABNORMAL LOW (ref 13.0–17.0)
MCH: 26 pg (ref 26.0–34.0)
MCHC: 31.7 g/dL (ref 30.0–36.0)
MCV: 82 fL (ref 80.0–100.0)
Platelets: 203 10*3/uL (ref 150–400)
RBC: 3.66 MIL/uL — ABNORMAL LOW (ref 4.22–5.81)
RDW: 13.9 % (ref 11.5–15.5)
WBC: 3.1 10*3/uL — ABNORMAL LOW (ref 4.0–10.5)
nRBC: 0 % (ref 0.0–0.2)

## 2020-01-18 LAB — RENAL FUNCTION PANEL
Albumin: 2.8 g/dL — ABNORMAL LOW (ref 3.5–5.0)
Anion gap: 9 (ref 5–15)
BUN: 17 mg/dL (ref 8–23)
CO2: 28 mmol/L (ref 22–32)
Calcium: 8.3 mg/dL — ABNORMAL LOW (ref 8.9–10.3)
Chloride: 99 mmol/L (ref 98–111)
Creatinine, Ser: 5.39 mg/dL — ABNORMAL HIGH (ref 0.61–1.24)
GFR, Estimated: 11 mL/min — ABNORMAL LOW (ref >60)
Glucose, Bld: 110 mg/dL — ABNORMAL HIGH (ref 70–99)
Phosphorus: 2.1 mg/dL — ABNORMAL LOW (ref 2.5–4.6)
Potassium: 3.1 mmol/L — ABNORMAL LOW (ref 3.5–5.1)
Sodium: 136 mmol/L (ref 135–145)

## 2020-01-18 NOTE — Progress Notes (Signed)
Patient arrived to 6N12 from ED. Report received from [place], [place]. Patient alert and oriented x4.See assessment. Patient call bell within reach. Will continue to monitor.

## 2020-01-18 NOTE — Discharge Summary (Signed)
Name: Jacob Daniel MRN: [id number] DOB: [birth year] 64 y.o. PCP: Jacob Jews, PA-C  Date of Admission: 01/17/2020  8:27 AM Date of Discharge: 01/18/2020 Attending Physician: Jacob Contes MD  Discharge Diagnosis: 1. Acute on chronic diastolic heart failure with ESRD 2. Neutropenia 3. Anemia of Chronic Disease  Discharge Medications: Allergies as of 01/18/2020   No Known Allergies     Medication List    STOP taking these medications   hydrochlorothiazide 25 MG tablet Commonly known as: HYDRODIURIL     TAKE these medications   amLODipine 10 MG tablet Commonly known as: NORVASC Take 10 mg by mouth daily.   aspirin 81 MG tablet Take 81 mg by mouth daily.   atorvastatin 10 MG tablet Commonly known as: LIPITOR Take 10 mg by mouth daily.   calcium acetate 667 MG capsule Commonly known as: PHOSLO Take 667 mg by mouth daily. With a meal   carvedilol 25 MG tablet Commonly known as: COREG Take 25 mg by mouth 2 (two) times daily.   doxazosin 4 MG tablet Commonly known as: CARDURA Take 4 mg by mouth at bedtime.   indomethacin 25 MG capsule Commonly known as: INDOCIN Take 25 mg by mouth 2 (two) times daily as needed (for gout flare (with a meal)).   magnesium oxide 400 MG tablet Commonly known as: MAG-OX Take 400 mg by mouth daily.   mycophenolate 250 MG capsule Commonly known as: CELLCEPT Take 750 mg by mouth 2 (two) times daily.   sodium bicarbonate 650 MG tablet Take 650 mg by mouth 2 (two) times daily.   tacrolimus 1 MG capsule Commonly known as: PROGRAF Take 4 mg by mouth 2 (two) times daily.       Disposition and follow-up:   [name] was discharged from [facility] in Stable condition.  At the hospital follow up visit please address:  1.  Acute on diastolic heart failure w/ ESRD - Ensure adherence to low salt diet / fluid restriction - Ensure he follows up with regularly scheduled dialysis session  2. Neutropenia /  Anemia - Thought to be due to ESRD and Tacrolimus and Prograf - Monitor cbc  2.  Labs / imaging needed at time of follow-up: cbc, bmp  3.  Pending labs/ test needing follow-up: N/A  Follow-up Appointments:  Follow-up Information    Jacob Providence H, PA-C. Call.   Specialty: Physician Assistant Contact information: [address] [place] [address] Jacob Daniel by problem list:  1.  Acute on diastolic heart failure w/ ESRD: [name] is a 64 yo M w/ PMH of ESRD s/p failed transplant, HFpEF, HTN presenting with dyspnea. He was noted to be hypoxic to 82% requiring supplemental oxygen. His chest X-ray and physical exam revealed pulmonary edema. He was dialyzed in the hospital dialysis unit and was observed overnight for resolution of his respiratory distress. He was noted to be satting well on room air after dialysis and was discharged with instructions to resume his dialysis sessions.  2. Neutropenia / Anemia: On admission labs, noted to have neutropenia with wbc of 2.0 and anemia with hemoglobin of 10.3. Suspected to be due to his ESRD as well as side effects from his Cellcept and Prograf. Observed overnight without evidence of bleeding or infection. Discharged with recommendation to follow up with his outpatient nephrologist.  Discharge Vitals:   BP (!) 145/91 (BP  Location: Right Arm)   Pulse 67   Temp 98.1 F (36.7 C) (Oral)   Resp 18   Ht 6' (1.829 m)   Wt 89.4 kg   SpO2 94%   BMI 26.73 kg/m   Pertinent Labs, Studies, and Procedures:  CBC Latest Ref Rng & Units 01/18/2020 01/17/2020  WBC 4.0 - 10.5 K/uL 3.1(L) 2.0(L)  Hemoglobin 13.0 - 17.0 g/dL 9.5(L) 10.3(L)  Hematocrit 39 - 52 % 30.0(L) 33.8(L)  Platelets 150 - 400 K/uL 203 231   BMP Latest Ref Rng & Units 01/18/2020 01/17/2020  Glucose 70 - 99 mg/dL 110(Daniel) 139(Daniel)  BUN 8 - 23 mg/dL 17 38(Daniel)  Creatinine 0.61 - 1.24 mg/dL 5.39(Daniel) 9.59(Daniel)  Sodium 135 - 145 mmol/L 136 135    Potassium 3.5 - 5.1 mmol/L 3.1(L) 3.7  Chloride 98 - 111 mmol/L 99 100  CO2 22 - 32 mmol/L 28 21(L)  Calcium 8.9 - 10.3 mg/dL 8.3(L) 8.6(L)   PORTABLE CHEST 1 VIEW  COMPARISON:  No prior images available for comparison  FINDINGS: Trachea midline. Cardiomediastinal contours are normal accounting for portable AP technique.  No lobar level consolidative changes. No sign of pleural effusion. Increased density in the RIGHT lower and mid chest. This blends with the RIGHT infrahilar region.  On limited assessment no acute skeletal process. Generalized increased interstitial markings throughout the chest.  IMPRESSION: 1. Generalized increased interstitial markings throughout the chest may represent atypical infection or asymmetric edema. 2. Added density at the RIGHT lung base may represent asymmetric edema or developing pneumonia. Follow-up PA and lateral chest is suggested to ensure resolution following resolution of symptoms  Discharge Instructions: Discharge Instructions    Call MD for:  difficulty breathing, headache or visual disturbances   Complete by: As directed    Call MD for:  persistant dizziness or light-headedness   Complete by: As directed    Call MD for:  persistant nausea and vomiting   Complete by: As directed    Call MD for:  severe uncontrolled pain   Complete by: As directed    Call MD for:  temperature >100.4   Complete by: As directed    Diet - low sodium heart healthy   Complete by: As directed    Discharge instructions   Complete by: As directed    Dear Jacob Daniel  You came to [place] with shortness of breath. We have determined this was caused by pulmonary edema or extra fluid in your lungs. Here are our recommendations for you at discharge:  Please ensure you go to your dialysis sessions as scheduled  Thank you for choosing [facility]   Increase activity slowly   Complete by: As directed      Signed: Mosetta Anis, MD 01/18/2020, 5:25  PM Pager: [phone] After 5pm on weekdays and 1pm on weekends: On Call Pager: [phone]

## 2020-01-18 NOTE — Progress Notes (Signed)
Subjective:  Jacob Daniel is a 64 year old male with past medical history of ESRD s/p failed transplant, HFpEF, HTN who presented to [facility] on 11/03 with shortness of breath found to be hypoxic with pulmonary edema secondary to ESRD.  Overnight, no acute events.  This morning, patient appears comfortable on bed during examination. His breathing is normal and no sign of acute distress. No other complain.   Objective:  Vital signs in last 24 hours: Vitals:   01/17/20 2130 01/17/20 2132 01/17/20 2228 01/18/20 0441  BP: 133/88 (!) 134/93 (!) 141/82 (!) 145/91  Pulse:   68 67  Resp: (!) 25 (!) 21 18 18   Temp:  98 F (36.7 C) 98.1 F (36.7 C)   TempSrc:   Oral   SpO2: 100% 100% 97% 94%  Weight:      SpO2: 94 % O2 Flow Rate (L/min): 4 L/min  Intake/Output Summary (Last 24 hours) at 01/18/2020 1049 Last data filed at 01/17/2020 2132 Gross per 24 hour  Intake --  Output 4000 ml  Net -4000 ml   Filed Weights   01/17/20 1720  Weight: 89.4 kg   Gen: Well-developed, well nourished, NAD HEENT: NCAT head, hearing intact, EOMI, MMM Pulm: Breathing comfortably on room air, no cough, no distress  Extm: ROM intact, No peripheral edema, Stable AVFistula on RUE Skin: Dry, Warm, normal turgor Neuro: AAOx3, Answers questions appropriately Psych: Normal mood and affect   CBC Latest Ref Rng & Units 01/18/2020 01/17/2020  WBC 4.0 - 10.5 K/uL 3.1(L) 2.0(L)  Hemoglobin 13.0 - 17.0 g/dL 9.5(L) 10.3(L)  Hematocrit 39 - 52 % 30.0(L) 33.8(L)  Platelets 150 - 400 K/uL 203 231   CMP Latest Ref Rng & Units 01/18/2020 01/17/2020  Glucose 70 - 99 mg/dL 110(H) 139(H)  BUN 8 - 23 mg/dL 17 38(H)  Creatinine 0.61 - 1.24 mg/dL 5.39(H) 9.59(H)  Sodium 135 - 145 mmol/L 136 135  Potassium 3.5 - 5.1 mmol/L 3.1(L) 3.7  Chloride 98 - 111 mmol/L 99 100  CO2 22 - 32 mmol/L 28 21(L)  Calcium 8.9 - 10.3 mg/dL 8.3(L) 8.6(L)  Total Protein 6.5 - 8.1 g/dL - 7.0  Total Bilirubin 0.3 - 1.2 mg/dL - 1.0  Alkaline Phos  38 - 126 U/L - 60  AST 15 - 41 U/L - 11(L)  ALT 0 - 44 U/L - 10   HIV antibody - negative Hepatitis B surface antigen - negative COVID - negative  IMAGING: DG Chest Portable 1 View  Result Date: 01/17/2020 CLINICAL DATA:  Dialysis, shortness of breath with hypoxia EXAM: PORTABLE CHEST 1 VIEW COMPARISON:  No prior images available for comparison FINDINGS: Trachea midline. Cardiomediastinal contours are normal accounting for portable AP technique. No lobar level consolidative changes. No sign of pleural effusion. Increased density in the RIGHT lower and mid chest. This blends with the RIGHT infrahilar region. On limited assessment no acute skeletal process. Generalized increased interstitial markings throughout the chest. IMPRESSION: 1. Generalized increased interstitial markings throughout the chest may represent atypical infection or asymmetric edema. 2. Added density at the RIGHT lung base may represent asymmetric edema or developing pneumonia. Follow-up PA and lateral chest is suggested to ensure resolution following resolution of symptoms. Electronically Signed   By: Zetta Bills M.D.   On: 01/17/2020 09:14   Assessment/Plan:  Active Problems:   Pulmonary edema  Jacob Daniel is a 64 year old male with past medical history of ESRD s/p failed transplant, HFpEF, HTN who presented to [facility] on 11/03 with shortness of  breath found to be volume overload with pulmonary edema secondary to ESRD.  #ESRD on HD MWF, chronic #Acute on chronic diastolic heart failure Initially requiring 5L o2. Currently satting 94 on RA after dialysis. Mentions significant improvement in his symptoms. Denies any dyspnea, cough, orthopnea.  - Appreciate nephro recs: Adhere to fluid/salt restriction at home, ok to d/c - Resume outpatient dialysis schedule at discahrge  #Neutropenia #Hx of renal transplant Wbc 2.0 on admission. Likely due to transplant meds. Improved overnight. 2.0->3.1 - Trend cbc - C/w home  meds  #Anemia of chronic disease Stable at 9.5 - Trend cbc - F/u with outpatient nephrology  #HTN  Mentions hx of uncontrolled htn leading to renal failure. On amlodipine, carvedilol, doxazosin. Current bp 145/91. - C/w home meds  VTE ppx: Heparin Diet: Renal/carb modified Bowel regimen: MIralax Code status: Full code  Jacob Anis, MD 01/18/2020, 10:49 AM Pager: [phone] After 5pm on weekdays and 1pm on weekends: On Call pager [phone]

## 2020-01-18 NOTE — Progress Notes (Signed)
CSW arranged transportation for pt home with [facility].

## 2020-01-18 NOTE — Care Management Important Message (Signed)
Important Message  Patient Details  Name: Jacob Daniel MRN: [id number] Date of Birth: [birth year]   Medicare Important Message Given:  Yes  Patient left prior to IM delivery document mailed to the patient home address.     Tracy Kinner 01/18/2020, 3:46 PM

## 2020-01-18 NOTE — Discharge Instructions (Signed)
Pulmonary Edema Pulmonary edema is unusual buildup of fluid in the lungs. It makes it hard for a person to breathe. This condition is an emergency. Follow these instructions at home: Medicines  Take over-the-counter and prescription medicines only as told by your doctor.  If you were prescribed an antibiotic medicine, take it as told by your doctor. Do not stop taking the antibiotic even if you start to feel better.  Ask your doctor to help you write a plan with information about each medicine you take. This may include: ? Why you are taking it. ? Possible side effects. ? Best time of day to take it. ? Foods to take with it, or foods to avoid. ? When to stop taking it.  Make a list of each medicine, vitamin, or herbal supplement you take. ? Keep the list with you at all times. ? Show the list to your doctor at each visit and before starting a new medicine. ? Keep the list up to date. Lifestyle   Do regular exercise as told by your doctor. It is important to do it safely. You can do this by: ? Asking your doctor what exercises and activities are good and safe for you to do. ? Pacing your activities. This will prevent shortness of breath or chest pain. ? Resting for at least 1 hour before and after meals. ? Asking about cardiac rehabilitation programs. These may include:  Education.  Exercise plans.  Counseling.  Eat a heart-healthy diet that is low in salt, saturated fat, and cholesterol. Your doctor may suggest foods that are high in fiber, such as: ? Fresh fruits and vegetables. ? Whole grains. ? Beans.  Do not use any products that contain nicotine or tobacco, such as cigarettes and e-cigarettes. If you need help quitting, ask your doctor. General instructions  Keep a record of your weight. ? Record your hospital or clinic weight. When you get home, compare it to your scale and record your weight. ? Weigh yourself first thing in the morning each day, and record the  weights. You should weigh yourself every morning after you pee and before you eat breakfast. Wear the same amount of clothing each time you weigh yourself. ? Share your weight record with your doctor. These can help your doctor see if your body is holding extra fluid. ? Tell your doctor right away if you have gained weight quickly, or if you have gained weight as told by your doctor. Your medicines may need to be adjusted.  Check your blood pressure as often as told by your doctor. ? Buy a home blood pressure cuff at your drugstore. ? Record your blood pressure readings. Bring them with you for your clinic visits.  Stay at a healthy weight. Ask your doctor what weight is healthy for you.  Think about doing therapy or being a part of a support group.  Keep all follow-up visits as told by your doctor. This is important. Contact a doctor if:  You have questions about your medicines.  You miss a dose of your medicine. Get help right away if:  You have very bad chest pain, especially if the pain is crushing or pressure-like and spreads to the arms, back, neck, or jaw.  You have more swelling in your hands, feet, ankles, or belly (abdomen).  You feel sick to your stomach (nauseous).  You have strange sweating.  Your skin turns blue or pale.  Your shortness of breath gets worse.  You feel dizzy or  unsteady.  Your vision is blurry.  You have a headache.  You cough up bloody split.  You cannot sleep because it is hard to breathe.  You start to feel a "jumping" or "fluttering" sensation (palpitations) in the chest that is unusual for you.  You feel like you cannot get enough air.  You gain weight rapidly. These symptoms may be an emergency. Do not wait to see if the symptoms will go away. Get medical help right away. Call your local emergency services (911 in the [place] Do not drive yourself to the hospital. Summary  Pulmonary edema is unusual fluid buildup in the lungs that  can make it hard to breathe.  This condition is an emergency.  Keep a record of your weight. Call your doctor if you gain weight rapidly. This information is not intended to replace advice given to you by your health care provider. Make sure you discuss any questions you have with your health care provider. Document Revised: 02/12/2017 Document Reviewed: 06/02/2016 Elsevier Patient Education  2020 [employer].

## 2020-01-18 NOTE — Progress Notes (Signed)
  Date: 01/18/2020  Patient name: Jacob Daniel  Medical record number: [id number]  Date of birth: 06/27/1955   I have seen and evaluated Jacob Daniel and discussed their care with the Residency Team.  In brief, patient is 64 year old male with a past medical history of hypertension, ESRD status post transplant now on hemodialysis, chronic diastolic heart failure, anemia likely secondary to chronic disease who presented to the ED with worsening shortness of breath x1 day.  Patient states that he was feeling well until yesterday morning when he developed sudden onset of shortness of breath and weakness.  Patient states that he had worsening shortness of breath with lying flat.  Patient normally goes to dialysis on Monday Wednesday and Friday and had a normal dialysis session 2 days ago.  He also had an associated cough with clear phlegm.  No chest pain, no palpitations, no diaphoresis, no lightheadedness, no syncope, no focal weakness, no headache, no blurry vision, no fevers or chills.  Patient states that he feels well today and that his shortness of breath has resolved.  He would like to go home today  PMHx, Fam Hx, and/or Soc Hx : As per resident admit note  Vitals:   01/17/20 2228 01/18/20 0441  BP: (!) 141/82 (!) 145/91  Pulse: 68 67  Resp: 18 18  Temp: 98.1 F (36.7 C)   SpO2: 97% 94%   General: Awake, alert oriented x3, NAD CVS: Regular rate and rhythm, normal heart sounds Lungs: CTA bilaterally Abdomen: Soft, nontender, nondistended, normoactive bowel sounds Extremities: No edema noted, nontender to palpation HEENT: Normocephalic, atraumatic Psych: Normal mood and affect Skin: Warm and dry  Assessment and Plan: I have seen and evaluated the patient as outlined above. I agree with the formulated Assessment and Plan as detailed in the residents' note, with the following changes:   1. Acute on chronic diastolic heart failure exacerbation in the setting of ESRD: -Patient  presented to the ED with worsening shortness of breath and associated cough x1 day.  He was initially hypoxic and required 5 L O2 via nasal cannula.  Chest x-ray showed evidence of asymmetric edema.  Patient also had associated with orthopnea.  This was consistent with an acute on chronic diastolic heart failure exacerbation in the setting of ESRD. -Patient status post hemodialysis yesterday with removal of 4 L -Patient currently with O2 sats in the 90s on room air and is asymptomatic -Nephro follow-up and recommendations appreciated -Patient will need to adhere to a strict fluid/salt restriction diet at home -Patient will resume his outpatient hemodialysis tomorrow -No further work-up at this time -Patient stable for DC home today  Aldine Contes, MD 11/4/202112:39 PM

## 2020-01-18 NOTE — Progress Notes (Signed)
[facility] Progress Note   Subjective:   Patient seen and examined at bedside. Reports he is feeling much better, wants to go home. Shortness of breath is resolved. No CP, palpitations, or dizziness. No issues with HD overnight, net UF 4L.   Objective Vitals:   01/17/20 2130 01/17/20 2132 01/17/20 2228 01/18/20 0441  BP: 133/88 (!) 134/93 (!) 141/82 (!) 145/91  Pulse:   68 67  Resp: (!) 25 (!) 21 18 18   Temp:  98 F (36.7 C) 98.1 F (36.7 C)   TempSrc:   Oral   SpO2: 100% 100% 97% 94%  Weight:       Physical Exam General: Well developed, alert male in NAD Heart: RRR, no murmurs, rubs or gallops Lungs: Crackles b/l lobes, clear with deep breaths. No wheezing, respirations unlabored on RA Abdomen: Soft, non-tender, non-distended, +BS Extremities: No edema b/l lower extremities Dialysis Access: RUE AVF + t/b  Additional Objective Labs: Basic Metabolic Panel: Recent Labs  Lab 01/17/20 0836 01/18/20 0033  NA 135 136  K 3.7 3.1*  CL 100 99  CO2 21* 28  GLUCOSE 139* 110*  BUN 38* 17  CREATININE 9.59* 5.39*  CALCIUM 8.6* 8.3*  PHOS  --  2.1*   Liver Function Tests: Recent Labs  Lab 01/17/20 0836 01/18/20 0033  AST 11*  --   ALT 10  --   ALKPHOS 60  --   BILITOT 1.0  --   PROT 7.0  --   ALBUMIN 3.3* 2.8*   CBC: Recent Labs  Lab 01/17/20 0836 01/18/20 0033  WBC 2.0* 3.1*  NEUTROABS 0.6*  --   HGB 10.3* 9.5*  HCT 33.8* 30.0*  MCV 84.1 82.0  PLT 231 203    Studies/Results: DG Chest Portable 1 View  Result Date: 01/17/2020 CLINICAL DATA:  Dialysis, shortness of breath with hypoxia EXAM: PORTABLE CHEST 1 VIEW COMPARISON:  No prior images available for comparison FINDINGS: Trachea midline. Cardiomediastinal contours are normal accounting for portable AP technique. No lobar level consolidative changes. No sign of pleural effusion. Increased density in the RIGHT lower and mid chest. This blends with the RIGHT infrahilar region. On limited  assessment no acute skeletal process. Generalized increased interstitial markings throughout the chest. IMPRESSION: 1. Generalized increased interstitial markings throughout the chest may represent atypical infection or asymmetric edema. 2. Added density at the RIGHT lung base may represent asymmetric edema or developing pneumonia. Follow-up PA and lateral chest is suggested to ensure resolution following resolution of symptoms. Electronically Signed   By: Zetta Bills M.D.   On: 01/17/2020 09:14   Medications:  . amLODipine  10 mg Oral Daily  . aspirin EC  81 mg Oral Daily  . atorvastatin  10 mg Oral Daily  . calcium acetate  667 mg Oral Q breakfast  . carvedilol  25 mg Oral BID WC  . Chlorhexidine Gluconate Cloth  6 each Topical Q0600  . heparin  5,000 Units Subcutaneous Q8H  . magnesium oxide  400 mg Oral Daily  . mycophenolate  750 mg Oral BID  . sodium bicarbonate  650 mg Oral BID  . tacrolimus  4 mg Oral BID    Dialysis Orders: From care everywhere: NEPHROLOGIST: Gifford Shave MD  LOCATION: [facility]  SCHEDULE: M-W-F 2nd Shift  CHAIR:  EDW: 190 kg. (pt reports EDW 193-194kg) KIDNEY: [facility] F200N  HD TIME: 204  ACCESS: R Forearm AVF  NEEDLE SIZE: 17 ga.  ANTICOAG: Standard Heparin Per Protocol 4100/1000  BATH:  3K-HCO3  QB: 360  Assessment/Plan: 1.  Shortness of breath: Acute onset 11/2. Initial O2 sat 80% on room air. CXR consistent with pulmonary edema. Had HD here yesterday with UF 4L and symptoms resolved. Will attempt to obtain standing weight today to better evaluate EDW.  Discussed the importance of fluid and salt restrictions.  2.  ESRD:  Dialyzes at [facility] on MWF schedule. Hx kidney transplant as above. Continue dialysis on MWF schedule, ok to discharge from a renal standpoint. 3. Hypokalemia: Used 4K bath with dialysis, K+ 3.1 this AM. Recommend continuing high potassium bath, liberalize potassium in diet, and follow closely at outpatient HD.   4. History of kidney transplant: Received kidney transplant in 09/2008 which ultimately failed about 7 months ago per pt. Remains on prograf and cellcept. WBC count is low, most likely from immunosuppressant medications. No sign of infection. Recommend follow up with outpatient nephrologist.  5.  Hypertension/volume: BP improved after dialysis but still slightly elevated. Continue home meds, recommend coontinuing to challenge UF as tolerated.  6.  Anemia: Hgb 9.5. Will obtain outpatient ESA records.  7.  Metabolic bone disease: Calcium controlled. Phosphorus is 2.1. Will hold phosphorus binder for now.  8.  Nutrition:  Renal diet/fluid restrictions recommended   Anice Paganini, PA-C 01/18/2020, 10:05 AM  [facility] Pager: [phone]

## 2020-05-07 ENCOUNTER — Encounter: Payer: Self-pay | Admitting: Podiatry

## 2020-05-07 ENCOUNTER — Ambulatory Visit (INDEPENDENT_AMBULATORY_CARE_PROVIDER_SITE_OTHER): Payer: Medicare Other | Admitting: Podiatry

## 2020-05-07 ENCOUNTER — Other Ambulatory Visit: Payer: Self-pay

## 2020-05-07 DIAGNOSIS — M79674 Pain in right toe(s): Secondary | ICD-10-CM

## 2020-05-07 DIAGNOSIS — E0822 Diabetes mellitus due to underlying condition with diabetic chronic kidney disease: Secondary | ICD-10-CM | POA: Diagnosis not present

## 2020-05-07 DIAGNOSIS — N186 End stage renal disease: Secondary | ICD-10-CM | POA: Diagnosis not present

## 2020-05-07 DIAGNOSIS — Z992 Dependence on renal dialysis: Secondary | ICD-10-CM

## 2020-05-07 DIAGNOSIS — M79675 Pain in left toe(s): Secondary | ICD-10-CM

## 2020-05-07 DIAGNOSIS — B351 Tinea unguium: Secondary | ICD-10-CM

## 2020-05-11 NOTE — Progress Notes (Signed)
  Subjective:  Patient ID: Jacob Daniel, male    DOB: 15-Jan-1956,  MRN: [id number]  65 y.o. male presents with at risk foot care. Pt has h/o NIDDM with chronic kidney disease and painful thick toenails that are difficult to trim. Pain interferes with ambulation. Aggravating factors include wearing enclosed shoe gear. Pain is relieved with periodic professional debridement.Marland Kitchen    PCP: Curt Jews, PA-C and last visit was: 11/23/2019.  Review of Systems: Negative except as noted in the HPI.   No Known Allergies  Objective:  There were no vitals filed for this visit. Constitutional Patient is a pleasant 65 y.o. African American male in NAD. AAO x 3.  Vascular Capillary refill time to digits immediate b/l. Palpable pedal pulses b/l LE. Pedal hair absent. Lower extremity skin temperature gradient within normal limits. No cyanosis or clubbing noted.  Neurologic Normal speech. Protective sensation intact 5/5 intact bilaterally with 10g monofilament b/l. Vibratory sensation intact b/l.  Dermatologic Pedal skin with normal turgor, texture and tone bilaterally. No open wounds bilaterally. No interdigital macerations bilaterally. Toenails 1-5 b/l elongated, discolored, dystrophic, thickened, crumbly with subungual debris and tenderness to dorsal palpation.  Orthopedic: Normal muscle strength 5/5 to all lower extremity muscle groups bilaterally. No pain crepitus or joint limitation noted with ROM b/l. Hallux valgus with bunion deformity noted b/l lower extremities.    Assessment:   1. Pain due to onychomycosis of toenails of both feet   2. 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  Onychomycosis with pain -Nails palliatively debridement as below. -Educated on self-care  Procedure: Nail Debridement Rationale: Pain Type of  Debridement: manual, sharp debridement. Instrumentation: Nail nipper, rotary burr. Number of Nails: 10  -Examined patient. -No new findings. No new orders. -Continue diabetic foot care principles. -Patient to continue soft, supportive shoe gear daily. -Toenails 1-5 b/l were debrided in length and girth with sterile nail nippers and dremel without iatrogenic bleeding.  -Patient to report any pedal injuries to medical professional immediately. -Patient/POA to call should there be question/concern in the interim.  Return in about 3 months (around 08/04/2020).  Marzetta Board, DPM

## 2020-06-13 ENCOUNTER — Other Ambulatory Visit: Payer: Self-pay

## 2020-06-13 DIAGNOSIS — N186 End stage renal disease: Secondary | ICD-10-CM

## 2020-06-14 ENCOUNTER — Emergency Department (HOSPITAL_COMMUNITY): Payer: Medicare Other

## 2020-06-14 ENCOUNTER — Other Ambulatory Visit: Payer: Self-pay

## 2020-06-14 ENCOUNTER — Emergency Department (HOSPITAL_COMMUNITY)
Admission: EM | Admit: 2020-06-14 | Discharge: 2020-06-14 | Disposition: A | Discharge status: Home / Self Care | Payer: Medicare Other | Attending: Emergency Medicine | Admitting: Emergency Medicine

## 2020-06-14 ENCOUNTER — Encounter (HOSPITAL_COMMUNITY): Payer: Self-pay | Admitting: Emergency Medicine

## 2020-06-14 DIAGNOSIS — I130 Hypertensive heart and chronic kidney disease with heart failure and stage 1 through stage 4 chronic kidney disease, or unspecified chronic kidney disease: Secondary | ICD-10-CM | POA: Diagnosis not present

## 2020-06-14 DIAGNOSIS — Z79899 Other long term (current) drug therapy: Secondary | ICD-10-CM | POA: Diagnosis not present

## 2020-06-14 DIAGNOSIS — R0602 Shortness of breath: Secondary | ICD-10-CM | POA: Diagnosis not present

## 2020-06-14 DIAGNOSIS — N189 Chronic kidney disease, unspecified: Secondary | ICD-10-CM | POA: Diagnosis not present

## 2020-06-14 DIAGNOSIS — E1122 Type 2 diabetes mellitus with diabetic chronic kidney disease: Secondary | ICD-10-CM | POA: Diagnosis not present

## 2020-06-14 DIAGNOSIS — I509 Heart failure, unspecified: Secondary | ICD-10-CM | POA: Insufficient documentation

## 2020-06-14 DIAGNOSIS — R072 Precordial pain: Secondary | ICD-10-CM | POA: Insufficient documentation

## 2020-06-14 DIAGNOSIS — R079 Chest pain, unspecified: Secondary | ICD-10-CM

## 2020-06-14 LAB — CBC WITH DIFFERENTIAL/PLATELET
Abs Immature Granulocytes: 0.09 10*3/uL — ABNORMAL HIGH (ref 0.00–0.07)
Basophils Absolute: 0 10*3/uL (ref 0.0–0.1)
Basophils Relative: 0 %
Eosinophils Absolute: 0.1 10*3/uL (ref 0.0–0.5)
Eosinophils Relative: 1 %
HCT: 27.1 % — ABNORMAL LOW (ref 39.0–52.0)
Hemoglobin: 8.5 g/dL — ABNORMAL LOW (ref 13.0–17.0)
Immature Granulocytes: 1 %
Lymphocytes Relative: 6 %
Lymphs Abs: 0.9 10*3/uL (ref 0.7–4.0)
MCH: 28.6 pg (ref 26.0–34.0)
MCHC: 31.4 g/dL (ref 30.0–36.0)
MCV: 91.2 fL (ref 80.0–100.0)
Monocytes Absolute: 1.4 10*3/uL — ABNORMAL HIGH (ref 0.1–1.0)
Monocytes Relative: 9 %
Neutro Abs: 12.6 10*3/uL — ABNORMAL HIGH (ref 1.7–7.7)
Neutrophils Relative %: 83 %
Platelets: 215 10*3/uL (ref 150–400)
RBC: 2.97 MIL/uL — ABNORMAL LOW (ref 4.22–5.81)
RDW: 13.8 % (ref 11.5–15.5)
WBC: 15.1 10*3/uL — ABNORMAL HIGH (ref 4.0–10.5)
nRBC: 0 % (ref 0.0–0.2)

## 2020-06-14 LAB — TROPONIN I (HIGH SENSITIVITY)
Troponin I (High Sensitivity): 30 ng/L — ABNORMAL HIGH (ref <18)
Troponin I (High Sensitivity): 27 ng/L — ABNORMAL HIGH (ref <18)

## 2020-06-14 LAB — D-DIMER, QUANTITATIVE: D-Dimer, Quant: 3.57 ug/mL-FEU — ABNORMAL HIGH (ref 0.00–0.50)

## 2020-06-14 LAB — COMPREHENSIVE METABOLIC PANEL
ALT: 7 U/L (ref 0–44)
AST: 15 U/L (ref 15–41)
Albumin: 2.5 g/dL — ABNORMAL LOW (ref 3.5–5.0)
Alkaline Phosphatase: 50 U/L (ref 38–126)
Anion gap: 12 (ref 5–15)
BUN: 36 mg/dL — ABNORMAL HIGH (ref 8–23)
CO2: 21 mmol/L — ABNORMAL LOW (ref 22–32)
Calcium: 8.3 mg/dL — ABNORMAL LOW (ref 8.9–10.3)
Chloride: 99 mmol/L (ref 98–111)
Creatinine, Ser: 9.79 mg/dL — ABNORMAL HIGH (ref 0.61–1.24)
GFR, Estimated: 5 mL/min — ABNORMAL LOW (ref >60)
Glucose, Bld: 112 mg/dL — ABNORMAL HIGH (ref 70–99)
Potassium: 4.7 mmol/L (ref 3.5–5.1)
Sodium: 132 mmol/L — ABNORMAL LOW (ref 135–145)
Total Bilirubin: 1.1 mg/dL (ref 0.3–1.2)
Total Protein: 6.9 g/dL (ref 6.5–8.1)

## 2020-06-14 MED ORDER — IOHEXOL 350 MG/ML SOLN
80.0000 mL | Freq: Once | INTRAVENOUS | Status: AC | PRN
  Administered 2020-06-14: 80 mL via INTRAVENOUS

## 2020-06-14 MED ORDER — FENTANYL CITRATE (PF) 100 MCG/2ML IJ SOLN
50.0000 ug | Freq: Once | INTRAMUSCULAR | Status: AC
  Administered 2020-06-14: 50 ug via INTRAVENOUS
  Filled 2020-06-14: qty 2

## 2020-06-14 MED ORDER — ALUM & MAG HYDROXIDE-SIMETH 200-200-20 MG/5ML PO SUSP
30.0000 mL | Freq: Once | ORAL | Status: AC
  Administered 2020-06-14: 30 mL via ORAL
  Filled 2020-06-14: qty 30

## 2020-06-14 MED ORDER — DEXAMETHASONE 4 MG PO TABS
10.0000 mg | ORAL_TABLET | Freq: Once | ORAL | Status: AC
  Administered 2020-06-14: 10 mg via ORAL
  Filled 2020-06-14: qty 3

## 2020-06-14 NOTE — ED Notes (Signed)
Patient transported to CT 

## 2020-06-14 NOTE — ED Triage Notes (Addendum)
Patient from home, complaining of chest pain and shortness of breath since 1a.  Patient states that it woke him up from sleep, sharp, non-radiating in the center of his chest.  Initially was 8/10 pain.  Patient was given '324mg'$  and 3 SL nitros with minimal relief.  Patient now has pain 6/10.  No diaphoresis.  Patient is a dialysis pt MWF.  Due today.  Had old fistula removed one week ago.  Has Hickman in right chest.

## 2020-06-15 NOTE — ED Provider Notes (Signed)
[facility] EMERGENCY DEPARTMENT Provider Note   CSN: [id number] Arrival date & time: 06/14/20  [id number]     History Chief Complaint  Patient presents with  . Chest Pain  . Shortness of Breath    Jacob Daniel is a 65 y.o. male.   Chest Pain Pain location:  Substernal area Pain quality: aching and sharp   Pain radiates to:  Does not radiate Pain severity:  Mild Duration:  2 days Timing:  Constant Chronicity:  New Context: breathing   Relieved by:  None tried Worsened by:  Nothing Ineffective treatments:  None tried Associated symptoms: shortness of breath   Shortness of Breath Associated symptoms: chest pain        Past Medical History:  Diagnosis Date  . Arthritis   . CHF (congestive heart failure) ([place])   . Chronic kidney disease   . Diabetes mellitus without complication ([place])   . Hypertension     Patient Active Problem List   Diagnosis Date Noted  . Pulmonary edema 01/17/2020  . Balance problem 05/26/2018  . Chronic pain of right knee 05/26/2018  . Right lumbar radiculitis 04/28/2018  . Anemia 10/20/2017  . History of colon polyps 10/20/2017  . NSAID long-term use 10/20/2017  . Positive fecal occult blood test 10/20/2017  . Acute gout 09/08/2017  . Chronic bilateral low back pain with right-sided sciatica 09/08/2017  . 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 Hypertension 09/08/2013  . Sciatica 09/08/2013  . History of renal transplant 08/10/2013  . Type 2 diabetes mellitus with complications ([place]) [id number]    Past Surgical History:  Procedure Laterality Date  . KIDNEY TRANSPLANT  2010  . SPINE SURGERY         History reviewed. No pertinent family history.  Social History   Tobacco Use  . Smoking status: Never Smoker  . Smokeless tobacco: Never Used  Substance Use Topics  . Alcohol use: Yes    Alcohol/week: 1.0 standard drink    Types: 1  Cans of beer per week  . Drug use: Yes    Types: Marijuana    Home Medications Prior to Admission medications   Medication Sig Start Date End Date Taking? Authorizing Provider  amLODipine (NORVASC) 10 MG tablet Take 10 mg by mouth daily. 07/06/19  Yes [provider]  atorvastatin (LIPITOR) 10 MG tablet Take 10 mg by mouth daily.  05/02/18  Yes [provider]  calcium acetate (PHOSLO) 667 MG capsule Take 2,001 mg by mouth 2 (two) times daily with a meal. 07/11/19  Yes [provider]  carvedilol (COREG) 25 MG tablet Take 25 mg by mouth 2 (two) times daily. 10/08/14  Yes [provider]  doxazosin (CARDURA) 4 MG tablet Take 4 mg by mouth at bedtime. 10/08/14  Yes [provider]  indomethacin (INDOCIN) 25 MG capsule Take 25 mg by mouth 2 (two) times daily as needed (for gout flare (with a meal)).   Yes [provider]  magnesium oxide (MAG-OX) 400 MG tablet Take 400 mg by mouth daily.  07/14/19  Yes [provider]  oxyCODONE-acetaminophen (PERCOCET/ROXICET) 5-325 MG tablet Take 1 tablet by mouth 3 (three) times daily as needed for moderate pain. 06/13/20  Yes [provider]  sildenafil (REVATIO) 20 MG tablet Take 20-60 mg by mouth daily as needed. 06/03/20  Yes [provider]  sodium bicarbonate 650 MG tablet Take 650 mg by mouth 2 (two) times daily.  07/26/19  Yes [provider]  tacrolimus (PROGRAF) 1 MG capsule Take 1 mg by mouth 2 (two) times daily. 10/08/14  Yes [provider]  valACYclovir (VALTREX) 500 MG tablet Take 500 mg by mouth See admin instructions. Qd x 7 days Patient not taking: Reported on 06/14/2020 05/14/20   [provider]    Allergies    Patient has no known allergies.  Review of Systems   Review of Systems  Respiratory: Positive for shortness of breath.   Cardiovascular: Positive for chest pain.  All other systems reviewed and are negative.   Physical Exam Updated  Vital Signs BP 114/82   Pulse 77   Temp 98.1 F (36.7 C) (Oral)   Resp (!) 25   SpO2 97%   Physical Exam Vitals and nursing note reviewed.  Constitutional:      Appearance: He is well-developed.  HENT:     Head: Normocephalic and atraumatic.     Mouth/Throat:     Mouth: Mucous membranes are moist.     Pharynx: Oropharynx is clear.  Eyes:     Pupils: Pupils are equal, round, and reactive to light.  Cardiovascular:     Rate and Rhythm: Normal rate.  Pulmonary:     Effort: Pulmonary effort is normal. No respiratory distress.  Abdominal:     General: There is no distension.     Palpations: Abdomen is soft.  Musculoskeletal:        General: Normal range of motion.     Cervical back: Normal range of motion.  Skin:    General: Skin is warm and dry.  Neurological:     General: No focal deficit present.     Mental Status: He is alert.     ED Results / Procedures / Treatments   Labs (all labs ordered are listed, but only abnormal results are displayed) Labs Reviewed  CBC WITH DIFFERENTIAL/PLATELET - Abnormal; Notable for the following components:      Result Value   WBC 15.1 (*)    RBC 2.97 (*)    Hemoglobin 8.5 (*)    HCT 27.1 (*)    Neutro Abs 12.6 (*)    Monocytes Absolute 1.4 (*)    Abs Immature Granulocytes 0.09 (*)    All other components within normal limits  COMPREHENSIVE METABOLIC PANEL - Abnormal; Notable for the following components:   Sodium 132 (*)    CO2 21 (*)    Glucose, Bld 112 (*)    BUN 36 (*)    Creatinine, Ser 9.79 (*)    Calcium 8.3 (*)    Albumin 2.5 (*)    GFR, Estimated 5 (*)    All other components within normal limits  D-DIMER, QUANTITATIVE - Abnormal; Notable for the following components:   D-Dimer, Quant 3.57 (*)    All other components within normal limits  TROPONIN I (HIGH SENSITIVITY) - Abnormal; Notable for the following components:   Troponin I (High Sensitivity) 30 (*)    All other components within normal limits  TROPONIN I  (HIGH SENSITIVITY) - Abnormal; Notable for the following components:   Troponin I (High Sensitivity) 27 (*)    All other components within normal limits    EKG EKG Interpretation  Date/Time:  Friday June 14 2020 02:42:24 EDT Ventricular Rate:  101 PR Interval:  162 QRS Duration: 76 QT Interval:  348 QTC Calculation: 451 R Axis:   17 Text Interpretation: Sinus tachycardia Otherwise normal ECG Confirmed by Wilda Wetherell, Corene Cornea [phone]) on  06/14/2020 6:10:43 AM   Radiology CT Angio Chest PE W and/or Wo Contrast  Result Date: 06/14/2020 CLINICAL DATA:  Pulmonary embolism, chronic renal failure awaiting renal transplant EXAM: CT ANGIOGRAPHY CHEST WITH CONTRAST TECHNIQUE: Multidetector CT imaging of the chest was performed using the standard protocol during bolus administration of intravenous contrast. Multiplanar CT image reconstructions and MIPs were obtained to evaluate the vascular anatomy. CONTRAST:  25m OMNIPAQUE IOHEXOL 350 MG/ML SOLN COMPARISON:  01/15/2003 FINDINGS: Cardiovascular: There is suboptimal opacification of the pulmonary arterial tree with opacification adequate only for exclusion of large central pulmonary emboli involving the main and central right and left pulmonary arteries, of which there are none. The lobar, segmental, and subsegmental pulmonary arteries are not adequately opacified to definitively exclude the presence of small pulmonary emboli. Extensive multi-vessel coronary artery calcification. Global cardiac size is within normal limits though left ventricular hypertrophy is noted. Small pericardial effusion with no CT evidence of cardiac tamponade. The central pulmonary arteries are of normal caliber. Mild atherosclerotic calcification is seen within the thoracic aorta. No aortic aneurysm. Right internal jugular hemodialysis catheter tip is seen within the right atrium. Mediastinum/Nodes: No pathologic thoracic adenopathy. Esophagus unremarkable. Lungs/Pleura: Mild bibasilar  dependent atelectasis. No superimposed focal pulmonary nodules or infiltrates. No pneumothorax or pleural effusion. Upper Abdomen: No acute abnormality identified within the visualized upper abdomen. Musculoskeletal: No acute bone abnormality. No lytic or blastic bone lesions are identified. Review of the MIP images confirms the above findings. IMPRESSION: Technically limited examination without evidence of a large central pulmonary embolism. The lobar, segmental, and subsegmental pulmonary arteries are not adequately opacified to definitively exclude the presence of small pulmonary emboli. Extensive multi-vessel coronary artery calcification. Left ventricular hypertrophy. Small pericardial effusion without CT evidence of cardiac tamponade. Mild bibasilar atelectasis. Aortic Atherosclerosis (ICD10-I70.0). Electronically Signed   By: AFidela SalisburyMD   On: 06/14/2020 06:27   DG Chest Portable 1 View  Result Date: 06/14/2020 CLINICAL DATA:  Chest pain EXAM: PORTABLE CHEST 1 VIEW COMPARISON:  01/17/2020 FINDINGS: Lung volumes are small and there is left basilar atelectasis as well as vascular crowding at the hila. 11 mm nodular opacity at the right lung base is indeterminate, likely obscured on prior examination by pulmonary edema noted on that examination. The lungs are otherwise clear. No pneumothorax or pleural effusion. Cardiac size within normal limits. Right internal jugular hemodialysis catheter tip seen within the superior right atrium. No acute bone abnormality. IMPRESSION: Pulmonary hypoinflation with mild left basilar atelectasis. Nodular opacity at the right lung base, indeterminate. Follow-up chest radiograph could be obtained in 3 months to document continuing stability or this could be better assessed with CT examination. Electronically Signed   By: AFidela SalisburyMD   On: 06/14/2020 04:16    Procedures Procedures   Medications Ordered in ED Medications  alum & mag hydroxide-simeth  (MAALOX/MYLANTA) 200-200-20 MG/5ML suspension 30 mL (30 mLs Oral Given 06/14/20 0344)  iohexol (OMNIPAQUE) 350 MG/ML injection 80 mL (80 mLs Intravenous Contrast Given 06/14/20 0617)  fentaNYL (SUBLIMAZE) injection 50 mcg (50 mcg Intravenous Given 06/14/20 0647)  dexamethasone (DECADRON) tablet 10 mg (10 mg Oral Given 06/14/20 [id number]    ED Course  I have reviewed the triage vital signs and the nursing notes.  Pertinent labs & imaging results that were available during my care of the patient were reviewed by me and considered in my medical decision making (see chart for details).    MDM Rules/Calculators/A&P  Workup ultimately reassurring. No ACS. No PE. No pneumonia. No other obvious emergent causes. Appears stable for discharge.   Final Clinical Impression(s) / ED Diagnoses Final diagnoses:  Chest pain, unspecified type    Rx / DC Orders ED Discharge Orders    None       Abdullah Rizzi, Corene Cornea, MD 06/15/20 [phone]

## 2020-06-25 ENCOUNTER — Encounter (HOSPITAL_COMMUNITY): Payer: Medicare Other

## 2020-06-25 ENCOUNTER — Other Ambulatory Visit (HOSPITAL_COMMUNITY): Payer: Medicare Other

## 2020-06-25 ENCOUNTER — Encounter: Payer: Medicare Other | Admitting: Vascular Surgery

## 2020-08-20 ENCOUNTER — Other Ambulatory Visit: Payer: Self-pay

## 2020-08-20 ENCOUNTER — Encounter: Payer: Self-pay | Admitting: Podiatry

## 2020-08-20 ENCOUNTER — Ambulatory Visit (INDEPENDENT_AMBULATORY_CARE_PROVIDER_SITE_OTHER): Payer: Medicare Other | Admitting: Podiatry

## 2020-08-20 DIAGNOSIS — M79675 Pain in left toe(s): Secondary | ICD-10-CM

## 2020-08-20 DIAGNOSIS — B351 Tinea unguium: Secondary | ICD-10-CM | POA: Diagnosis not present

## 2020-08-20 DIAGNOSIS — M79674 Pain in right toe(s): Secondary | ICD-10-CM | POA: Diagnosis not present

## 2020-08-20 DIAGNOSIS — Z992 Dependence on renal dialysis: Secondary | ICD-10-CM

## 2020-08-25 NOTE — Progress Notes (Signed)
Subjective: Jacob Daniel is a pleasant 65 y.o. male patient seen today for at-risk foot care with h/o CKD on hemodialysis for painful thick toenails that are difficult to trim. Pain interferes with ambulation. Aggravating factors include wearing enclosed shoe gear. Pain is relieved with periodic professional debridement.  PCP is [name], [employer], PA-C. Last visit was: 08/21/2020.  No Known Allergies  Objective: Physical Exam  General: Jacob Daniel is a pleasant 65 y.o. African American male, in NAD. AAO x 3.   Vascular:  Capillary refill time to digits immediate b/l. Palpable pedal pulses b/l LE. Pedal hair absent. Lower extremity skin temperature gradient within normal limits. No pain with calf compression b/l.  Dermatological:  Pedal skin with normal turgor, texture and tone bilaterally. No open wounds bilaterally. No interdigital macerations bilaterally. Toenails 1-5 b/l elongated, discolored, dystrophic, thickened, crumbly with subungual debris and tenderness to dorsal palpation.  Musculoskeletal:  Normal muscle strength 5/5 to all lower extremity muscle groups bilaterally. No pain crepitus or joint limitation noted with ROM b/l. Hallux valgus with bunion deformity noted b/l lower extremities.  Neurological:  Protective sensation intact 5/5 intact bilaterally with 10g monofilament b/l. Vibratory sensation intact b/l.  Assessment and Plan:  1. Pain due to onychomycosis of toenails of both feet   2. Dependence on renal dialysis [facility])    -Examined patient. -Continue diabetic foot care principles. -Patient to continue soft, supportive shoe gear daily. -Toenails 1-5 b/l were debrided in length and girth with sterile nail nippers and dremel without iatrogenic bleeding.  -Patient to report any pedal injuries to medical professional immediately. -Patient/POA to call should there be question/concern in the interim.  Return in about 3 months (around 11/20/2020).  Marzetta Board, DPM

## 2020-11-26 ENCOUNTER — Ambulatory Visit: Payer: Medicare Other | Admitting: Podiatry

## 2021-01-30 IMAGING — DX DG CHEST 1V PORT
1 series · 2 of 2 positions shown · non-contrast
Comparison: No prior images available for comparison

CLINICAL DATA: Dialysis, shortness of breath with hypoxia

EXAM:
PORTABLE CHEST 1 VIEW

[Series 1: chest · 0.14mm/px · 2 of 2 slices shown]
[im 1/2]
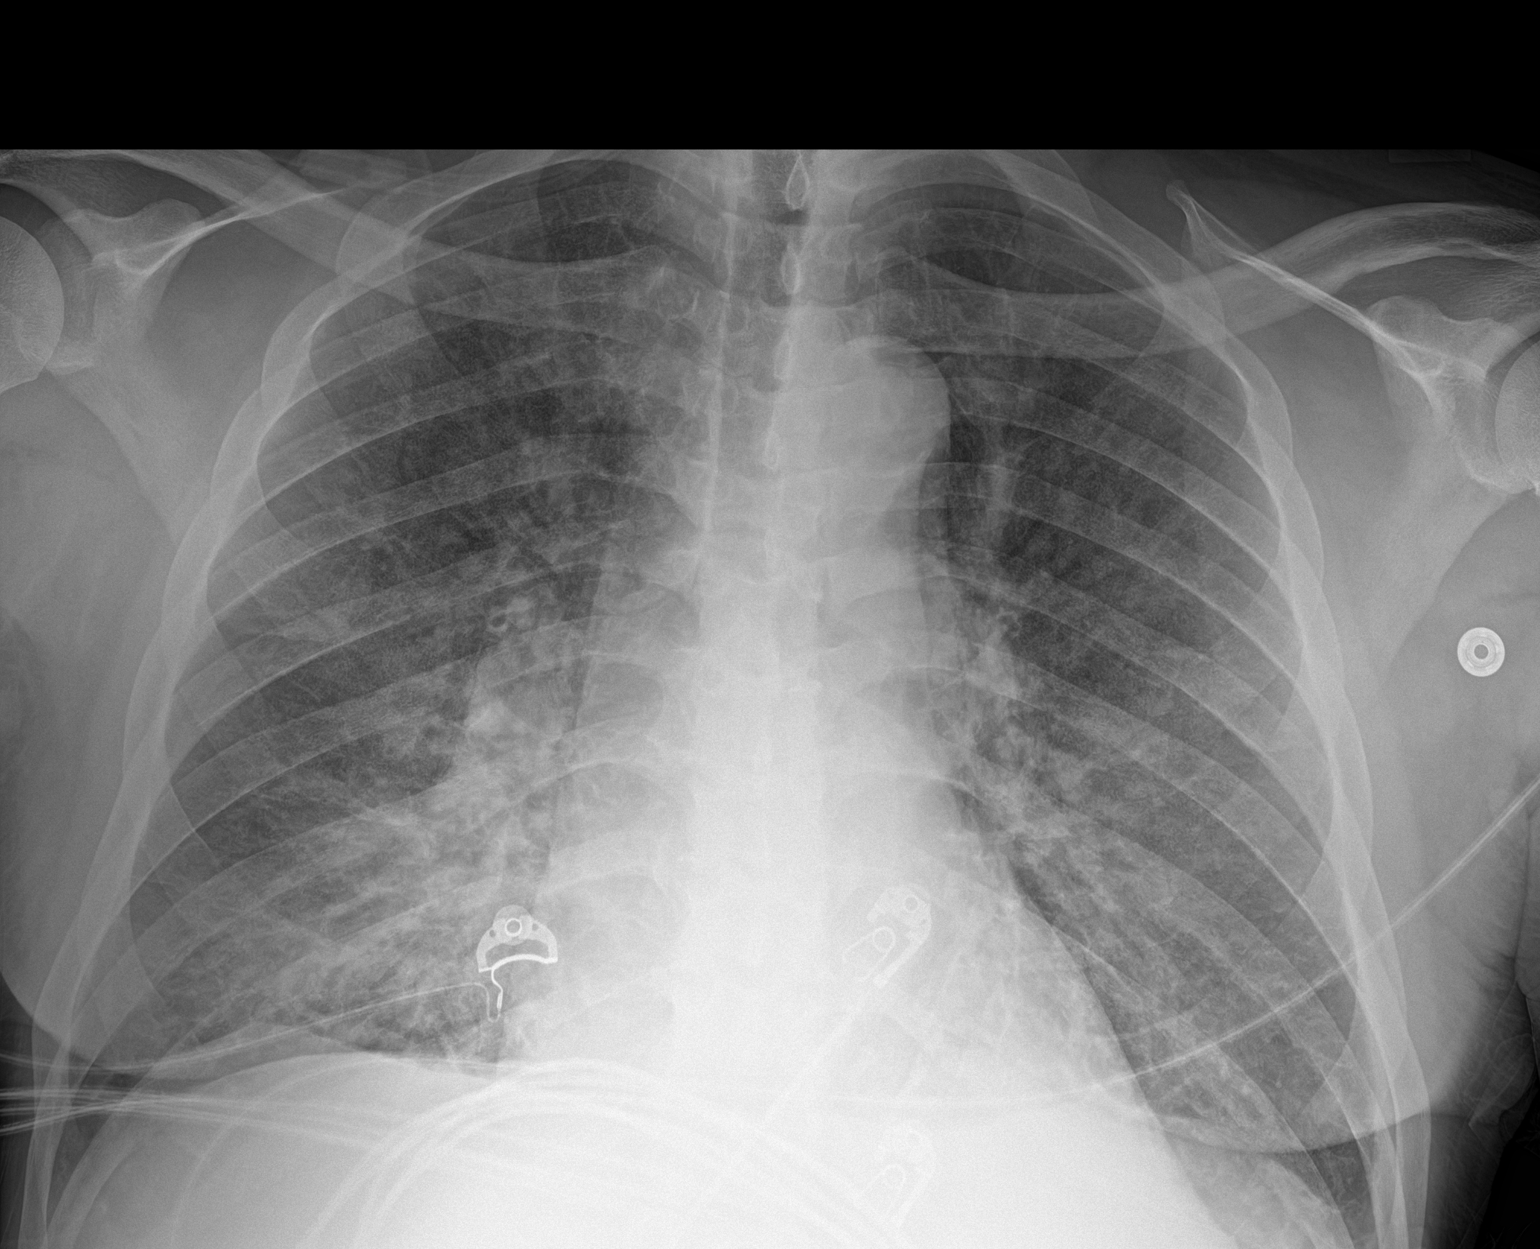
[im 2/2]
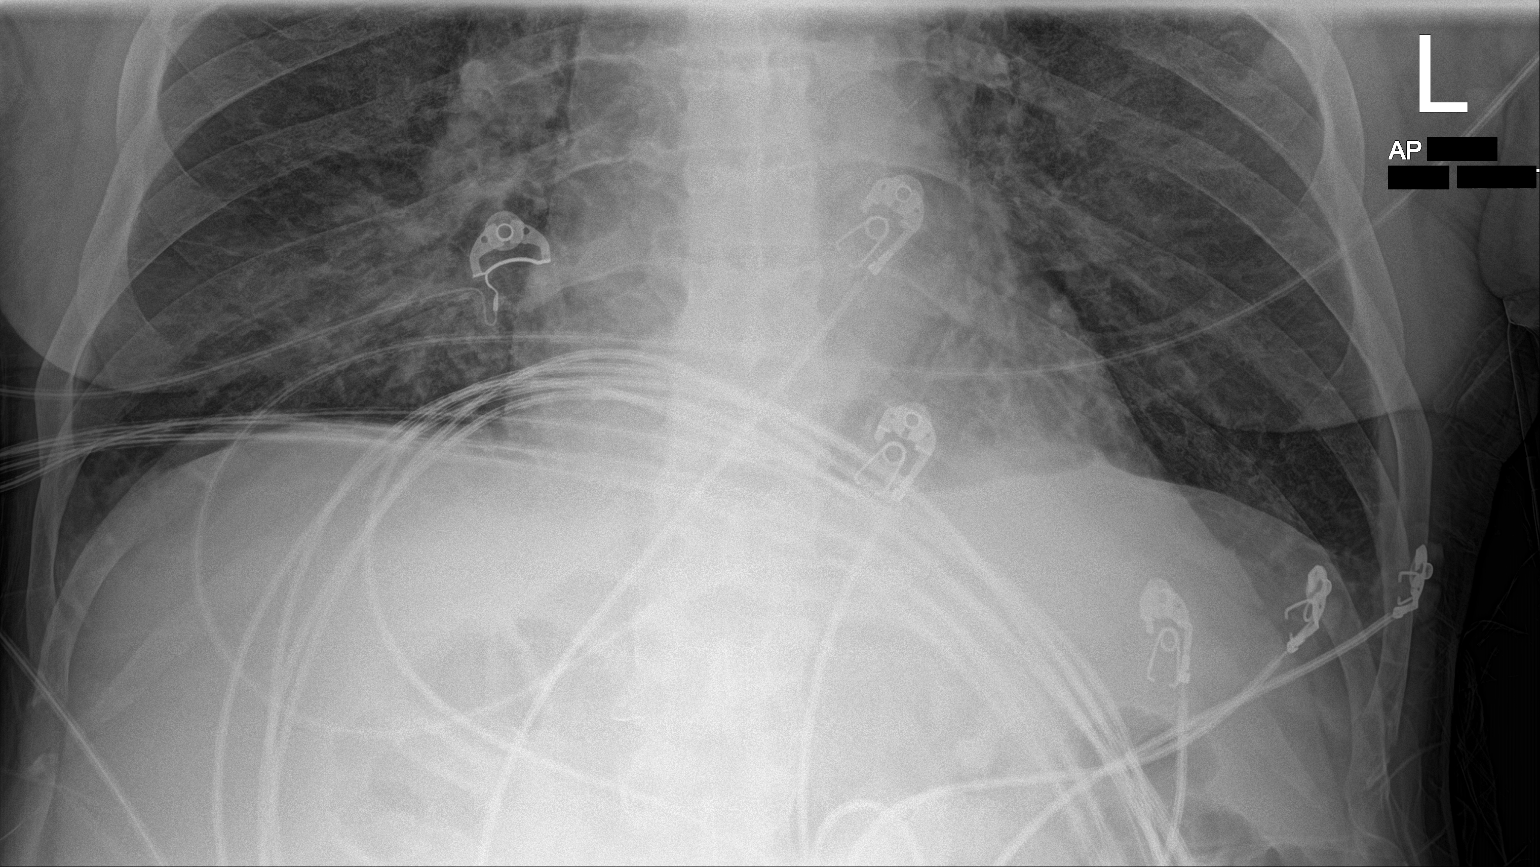

[2 of 2 positions shown; findings below may reference images not displayed]

FINDINGS: Trachea midline. Cardiomediastinal contours are normal accounting
for portable AP technique.

No lobar level consolidative changes. No sign of pleural effusion.
Increased density in the RIGHT lower and mid chest. This blends with
the RIGHT infrahilar region.

On limited assessment no acute skeletal process. Generalized
increased interstitial markings throughout the chest.
IMPRESSION: 1. Generalized increased interstitial markings throughout the chest
may represent atypical infection or asymmetric edema.
2. Added density at the RIGHT lung base may represent asymmetric
edema or developing pneumonia. Follow-up PA and lateral chest is
suggested to ensure resolution following resolution of symptoms.

## 2021-02-25 ENCOUNTER — Other Ambulatory Visit: Payer: Self-pay

## 2021-02-25 ENCOUNTER — Ambulatory Visit (INDEPENDENT_AMBULATORY_CARE_PROVIDER_SITE_OTHER): Payer: Medicare Other | Admitting: Podiatry

## 2021-02-25 ENCOUNTER — Encounter: Payer: Self-pay | Admitting: Podiatry

## 2021-02-25 DIAGNOSIS — M79674 Pain in right toe(s): Secondary | ICD-10-CM | POA: Diagnosis not present

## 2021-02-25 DIAGNOSIS — E119 Type 2 diabetes mellitus without complications: Secondary | ICD-10-CM

## 2021-02-25 DIAGNOSIS — M79675 Pain in left toe(s): Secondary | ICD-10-CM | POA: Diagnosis not present

## 2021-02-25 DIAGNOSIS — M2011 Hallux valgus (acquired), right foot: Secondary | ICD-10-CM

## 2021-02-25 DIAGNOSIS — E0822 Diabetes mellitus due to underlying condition with diabetic chronic kidney disease: Secondary | ICD-10-CM | POA: Diagnosis not present

## 2021-02-25 DIAGNOSIS — Z992 Dependence on renal dialysis: Secondary | ICD-10-CM

## 2021-02-25 DIAGNOSIS — N186 End stage renal disease: Secondary | ICD-10-CM

## 2021-02-25 DIAGNOSIS — B351 Tinea unguium: Secondary | ICD-10-CM

## 2021-02-25 DIAGNOSIS — M2012 Hallux valgus (acquired), left foot: Secondary | ICD-10-CM

## 2021-03-02 NOTE — Progress Notes (Signed)
ANNUAL DIABETIC FOOT EXAM  Subjective: Jacob Daniel presents today for for annual diabetic foot examination, at risk foot care. Patient has h/o ESRD on hemodialysis, and painful elongated mycotic toenails 1-5 bilaterally which are tender when wearing enclosed shoe gear. Pain is relieved with periodic professional debridement..  Patient denies any h/o foot wounds.  Patient denies any numbness, tingling, burning, or pins/needle sensation in feet. He does have diagnosis of sciatica.  Patient does not monitor blood glucose daily.  Jacob Jews, PA-C is patient's PCP. Last visit was one year ago.  Past Medical History:  Diagnosis Date   Arthritis    CHF (congestive heart failure) ([place])    Chronic kidney disease    Diabetes mellitus without complication ([place])    Hypertension    Patient Active Problem List   Diagnosis Date Noted   Pulmonary edema 01/17/2020   Erectile dysfunction 11/23/2019   Alcohol abuse 10/04/2019   Cannabis abuse 10/04/2019   Cardiomyopathy in diseases classified elsewhere ([place]) 10/04/2019   Chronic kidney disease, stage 3 unspecified ([place]) 10/04/2019   Dependence on renal dialysis ([place]) 10/04/2019   Hypomagnesemia 96/78/9381   Metabolic acidosis 01/75/1025   Obesity 10/04/2019   Obstructive sleep apnea syndrome 10/04/2019   Plantar fascial fibromatosis 85/27/7824   Systolic heart failure ([place]) 10/04/2019   End stage renal disease ([place]) 10/04/2019   Balance problem 05/26/2018   Chronic pain of right knee 05/26/2018   Right lumbar radiculitis 04/28/2018   Anemia 10/20/2017   History of colon polyps 10/20/2017   NSAID long-term use 10/20/2017   Positive fecal occult blood test 10/20/2017   Acute gout 09/08/2017   Chronic bilateral low back pain with right-sided sciatica 09/08/2017   Transplant rejection 04/16/2016   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 03/05/2015   Hypertension 09/08/2013   Sciatica 09/08/2013   History  of renal transplant 08/10/2013   Type 2 diabetes mellitus with complications ([place]) 23/53/6144   Past Surgical History:  Procedure Laterality Date   KIDNEY TRANSPLANT  2010   SPINE SURGERY     Current Outpatient Medications on File Prior to Visit  Medication Sig Dispense Refill   carvedilol (COREG) 25 MG tablet Take 1 tablet by mouth 2 (two) times daily.     ferrous sulfate 325 (65 FE) MG EC tablet Take by mouth.     amLODipine (NORVASC) 10 MG tablet Take 1 tablet by mouth daily.     atorvastatin (LIPITOR) 10 MG tablet Take 1 tablet by mouth daily.     calcium acetate (PHOSLO) 667 MG capsule Take by mouth.     doxazosin (CARDURA) 4 MG tablet Take 4 mg by mouth at bedtime.  5   DULoxetine (CYMBALTA) 20 MG capsule Take 20 mg by mouth daily.     indomethacin (INDOCIN) 25 MG capsule Take 25 mg by mouth 2 (two) times daily as needed (for gout flare (with a meal)).     magnesium oxide (MAG-OX) 400 MG tablet Take 400 mg by mouth daily.      oxyCODONE (OXY IR/ROXICODONE) 5 MG immediate release tablet Take 5 mg by mouth 4 (four) times daily as needed.     oxyCODONE-acetaminophen (PERCOCET/ROXICET) 5-325 MG tablet Take 1 tablet by mouth 3 (three) times daily as needed for moderate pain.     sildenafil (REVATIO) 20 MG tablet Take 20-60 mg by mouth daily as needed.     sodium bicarbonate 650 MG tablet Take 650 mg by mouth 2 (two) times daily.  tacrolimus (PROGRAF) 1 MG capsule Take 1 mg by mouth 2 (two) times daily.  5   valACYclovir (VALTREX) 500 MG tablet Take 500 mg by mouth See admin instructions. Qd x 7 days (Patient not taking: Reported on 06/14/2020)     venlafaxine (EFFEXOR) 25 MG tablet Take 25 mg by mouth daily.     No current facility-administered medications on file prior to visit.    No Known Allergies Social History   Occupational History   Not on file  Tobacco Use   Smoking status: Never   Smokeless tobacco: Never  Substance and Sexual Activity   Alcohol use: Yes     Alcohol/week: 1.0 standard drink    Types: 1 Cans of beer per week   Drug use: Yes    Types: Marijuana   Sexual activity: Not on file   History reviewed. No pertinent family history. Immunization History  Administered Date(s) Administered   Influenza,inj,Quad PF,6+ Mos 12/22/2017     Review of Systems: Negative except as noted in the HPI.   Objective: There were no vitals filed for this visit.  Jacob Daniel is a pleasant 65 y.o. male in NAD. AAO X 3.  Vascular Examination: CFT immediate b/l LE. Palpable DP/PT pulses b/l LE. Digital hair sparse b/l. Skin temperature gradient WNL b/l. No pain with calf compression b/l. No edema noted b/l. No cyanosis or clubbing noted b/l LE.  Dermatological Examination: Pedal integument with normal turgor, texture and tone b/l LE. No open wounds b/l. No interdigital macerations b/l. Toenails 1-5 b/l elongated, thickened, discolored with subungual debris. +Tenderness with dorsal palpation of nailplates. No hyperkeratotic or porokeratotic lesions present.  Musculoskeletal Examination: Muscle strength 5/5 to all lower extremity muscle groups bilaterally. No pain, crepitus or joint limitation noted with ROM bilateral LE. HAV with bunion deformity noted b/l LE.  Footwear Assessment: Does the patient wear appropriate shoes? Yes. Does the patient need inserts/orthotics? No.  Neurological Examination: Protective sensation intact 5/5 intact bilaterally with 10g monofilament b/l. Vibratory sensation intact b/l.  Assessment: 1. Pain due to onychomycosis of toenails of both feet   2. Hallux valgus, acquired, bilateral   3. Diabetes mellitus due to underlying condition with chronic kidney disease on chronic dialysis, without long-term current use of insulin ([place])   4. Encounter for diabetic foot exam ([place])      ADA Risk Categorization: Low Risk :  Patient has all of the following: Intact protective sensation No prior foot ulcer  No severe  deformity Pedal pulses present  Plan: -Diabetic foot examination performed today. -Continue foot and shoe inspections daily. Monitor blood glucose per PCP/Endocrinologist's recommendations. -Patient to continue soft, supportive shoe gear daily. -Mycotic toenails 1-5 bilaterally were debrided in length and girth with sterile nail nippers and dremel without incident. -Patient/POA to call should there be question/concern in the interim.  Return in about 3 months (around 05/26/2021).  Marzetta Board, DPM

## 2021-05-28 ENCOUNTER — Encounter: Payer: Self-pay | Admitting: Podiatry

## 2021-05-28 ENCOUNTER — Ambulatory Visit (INDEPENDENT_AMBULATORY_CARE_PROVIDER_SITE_OTHER): Payer: Medicare Other | Admitting: Podiatry

## 2021-05-28 ENCOUNTER — Other Ambulatory Visit: Payer: Self-pay

## 2021-05-28 DIAGNOSIS — E0822 Diabetes mellitus due to underlying condition with diabetic chronic kidney disease: Secondary | ICD-10-CM | POA: Diagnosis not present

## 2021-05-28 DIAGNOSIS — M79675 Pain in left toe(s): Secondary | ICD-10-CM | POA: Diagnosis not present

## 2021-05-28 DIAGNOSIS — Z992 Dependence on renal dialysis: Secondary | ICD-10-CM

## 2021-05-28 DIAGNOSIS — N186 End stage renal disease: Secondary | ICD-10-CM

## 2021-05-28 DIAGNOSIS — B351 Tinea unguium: Secondary | ICD-10-CM

## 2021-05-28 DIAGNOSIS — M79674 Pain in right toe(s): Secondary | ICD-10-CM

## 2021-06-05 NOTE — Progress Notes (Signed)
?  Subjective:  ?Patient ID: Jacob Daniel, male    DOB: [birth year],  MRN: [id number] ? ?Jacob Daniel presents to clinic today for at risk foot care with h/o NIDDM with ESRD on hemodialysis and painful elongated mycotic toenails 1-5 bilaterally which are tender when wearing enclosed shoe gear. Pain is relieved with periodic professional debridement. ? ?Patient is accompanied by his caregiver on today's visit. ? ?New problem(s): None.  ? ?PCP is Joella Prince , and last visit was July 09, 2020. ? ?No Known Allergies ? ?Review of Systems: Negative except as noted in the HPI. ? ?Objective: No changes noted in today's physical examination. ?Jacob Daniel is a pleasant 66 y.o. male in NAD. AAO X 3. ? ?Vascular Examination: ?CFT immediate b/l LE. Palpable DP/PT pulses b/l LE. Digital hair sparse b/l. Skin temperature gradient WNL b/l. No pain with calf compression b/l. No edema noted b/l. No cyanosis or clubbing noted b/l LE. ? ?Dermatological Examination: ?Pedal integument with normal turgor, texture and tone b/l LE. No open wounds b/l. No interdigital macerations b/l. Toenails 1-5 b/l elongated, thickened, discolored with subungual debris. +Tenderness with dorsal palpation of nailplates. No hyperkeratotic or porokeratotic lesions present. ? ?Musculoskeletal Examination: ?Muscle strength 5/5 to all lower extremity muscle groups bilaterally. No pain, crepitus or joint limitation noted with ROM bilateral LE. HAV with bunion deformity noted b/l LE. ? ?Neurological Examination: ?Protective sensation intact 5/5 intact bilaterally with 10g monofilament b/l. Vibratory sensation intact b/l. ?Assessment/Plan: ?1. Pain due to onychomycosis of toenails of both feet   ?2. Diabetes mellitus due to underlying condition with chronic kidney disease on chronic dialysis, without long-term current use of insulin ([place])   ?  ?-Examined patient. ?-Toenails 1-5 b/l were debrided in length and girth with sterile nail nippers and  dremel without iatrogenic bleeding.  ?-Patient/POA to call should there be question/concern in the interim.  ? ?Return in about 3 months (around 08/28/2021). ? ?Marzetta Board, DPM  ?

## 2021-06-28 IMAGING — DX DG CHEST 1V PORT
1 series · 1 of 1 positions shown · non-contrast
Comparison: 01/17/2020

CLINICAL DATA: Chest pain

EXAM:
PORTABLE CHEST 1 VIEW

[chest ap]
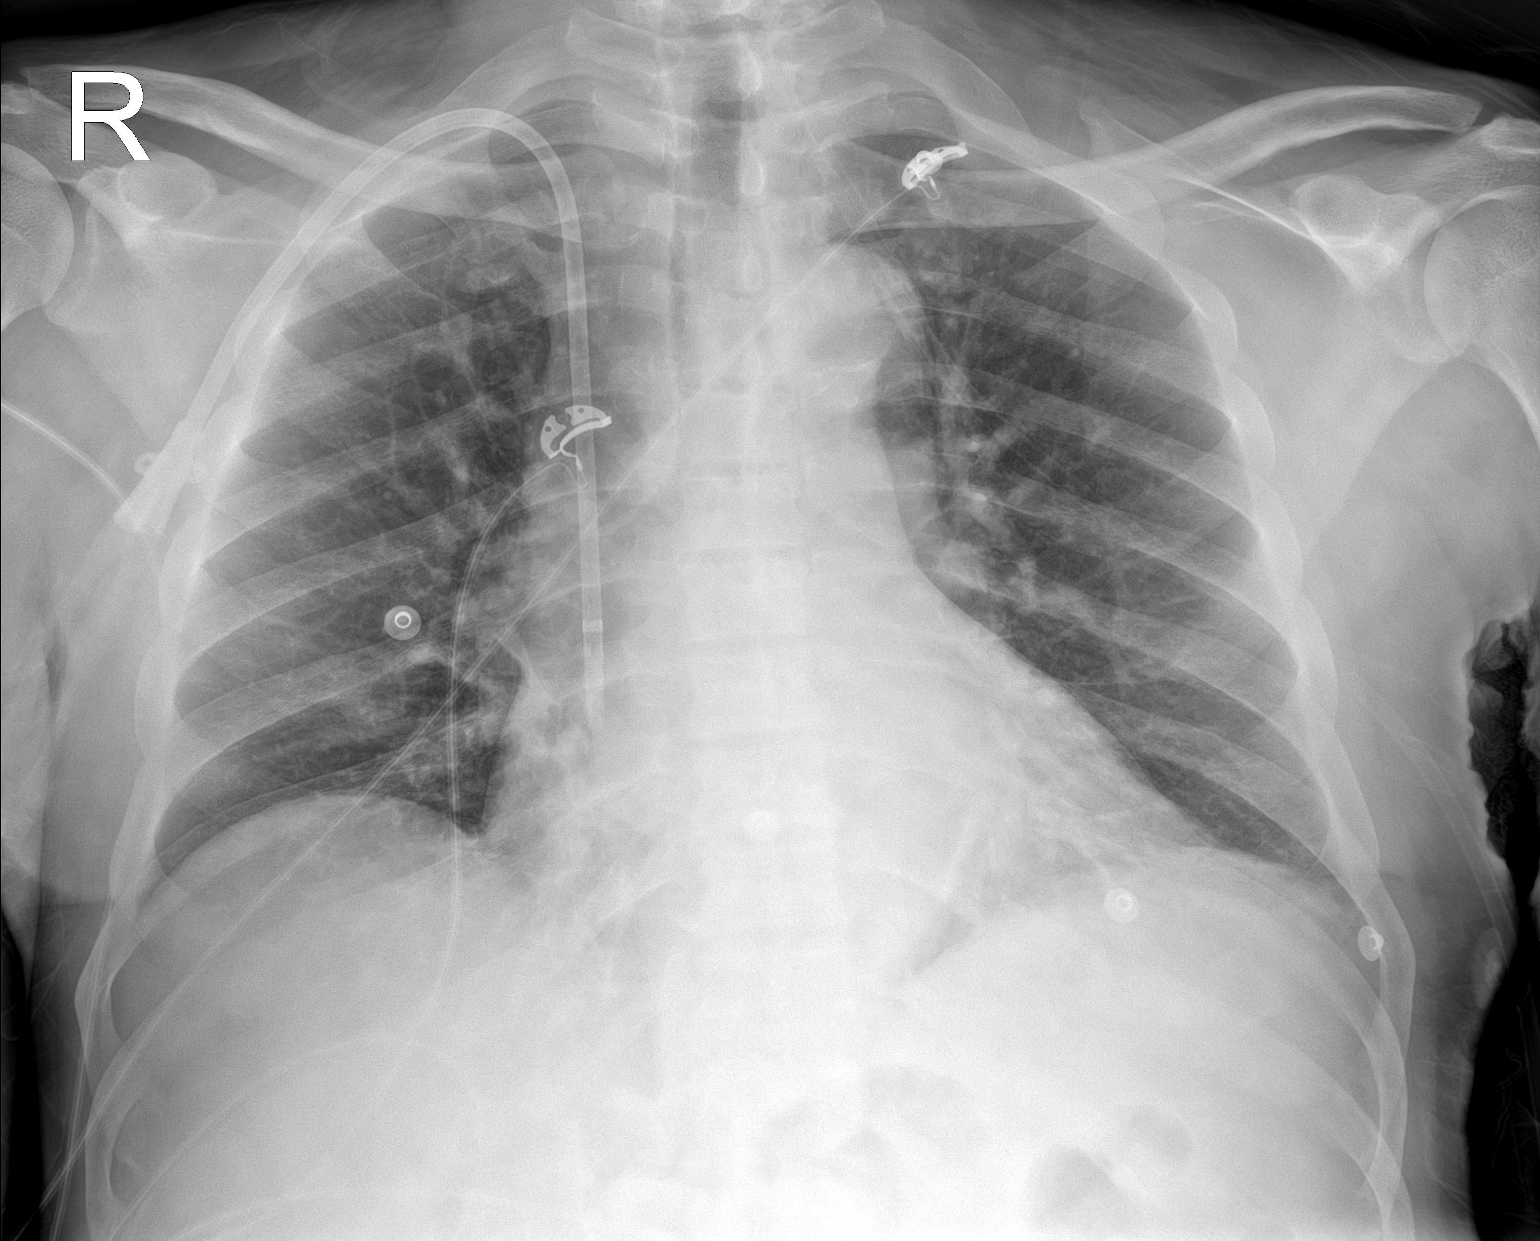

[1 of 1 positions shown; findings below may reference images not displayed]

FINDINGS: Lung volumes are small and there is left basilar atelectasis as well
as vascular crowding at the hila. 11 mm nodular opacity at the right
lung base is indeterminate, likely obscured on prior examination by
pulmonary edema noted on that examination. The lungs are otherwise
clear. No pneumothorax or pleural effusion. Cardiac size within
normal limits. Right internal jugular hemodialysis catheter tip seen
within the superior right atrium. No acute bone abnormality.
IMPRESSION: Pulmonary hypoinflation with mild left basilar atelectasis.

Nodular opacity at the right lung base, indeterminate. Follow-up
chest radiograph could be obtained in 3 months to document
continuing stability or this could be better assessed with CT
examination.

## 2021-06-28 IMAGING — CT CT ANGIO CHEST
2 of 6 series · 18 of 36 positions shown · IV contrast (omnipaque)
Comparison: 01/15/2003

CLINICAL DATA: Pulmonary embolism, chronic renal failure awaiting
renal transplant

EXAM:
CT ANGIOGRAPHY CHEST WITH CONTRAST
TECHNIQUE: Multidetector CT imaging of the chest was performed using the
standard protocol during bolus administration of intravenous
contrast. Multiplanar CT image reconstructions and MIPs were
obtained to evaluate the vascular anatomy.
CONTRAST:  80mL OMNIPAQUE IOHEXOL 350 MG/ML SOLN

[Series 7: pe thins · axial · 0.83mm/px · z∈[+1290,+1574]mm · 17 of 452 slices shown]
[im 23/452  lung]
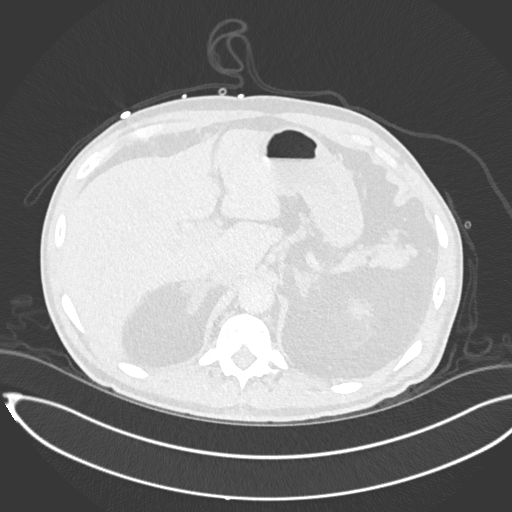
[im 46/452  mediastinal]
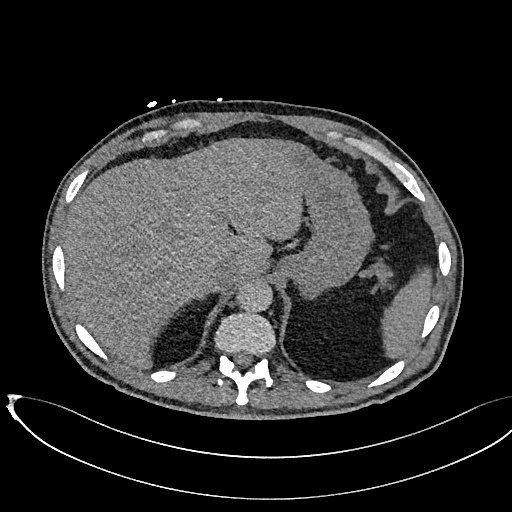
[im 68/452  lung]
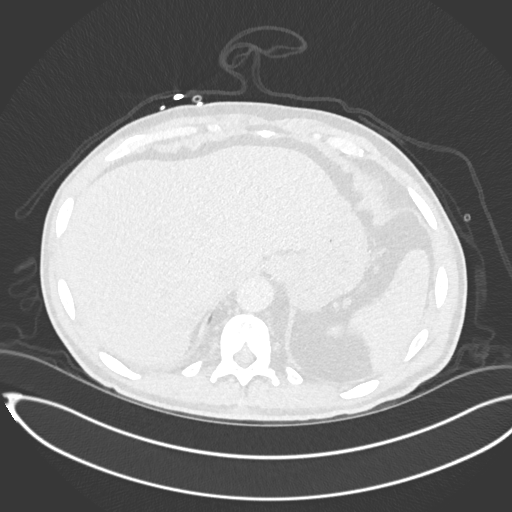
[im 91/452  mediastinal]
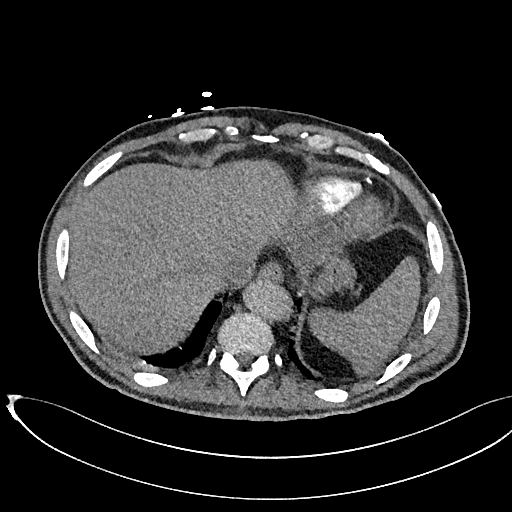
[im 136/452  lung]
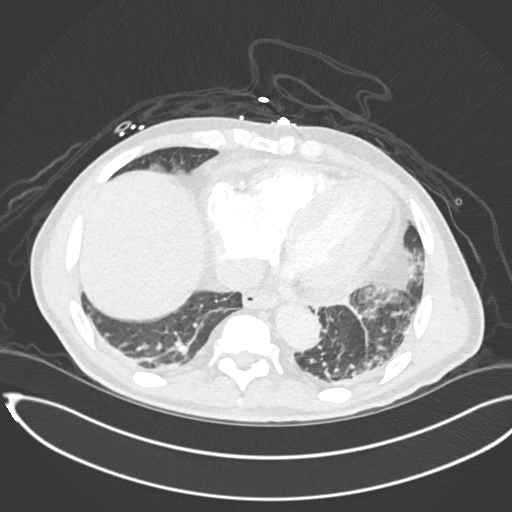
[im 158/452  mediastinal]
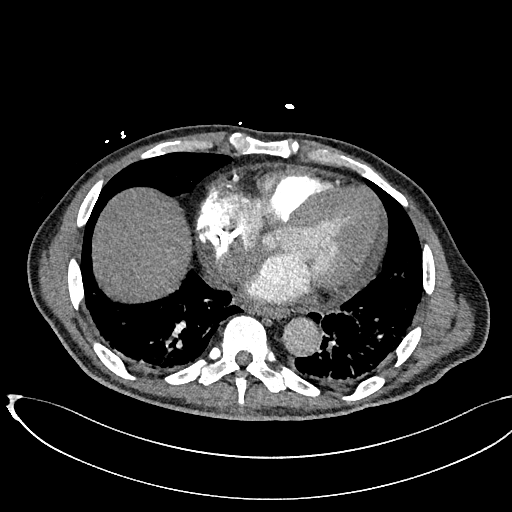
[im 181/452  lung]
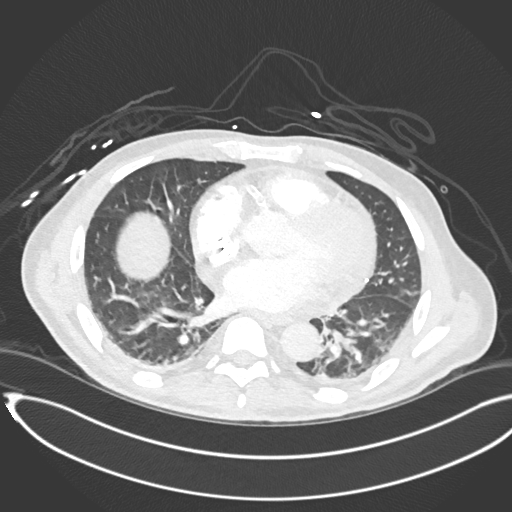
[im 203/452  mediastinal]
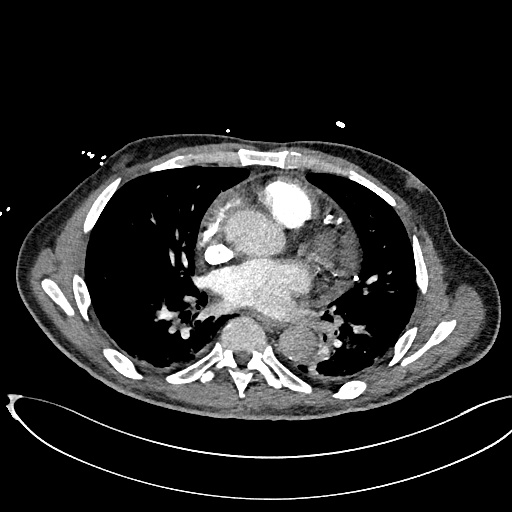
[im 226/452  lung]
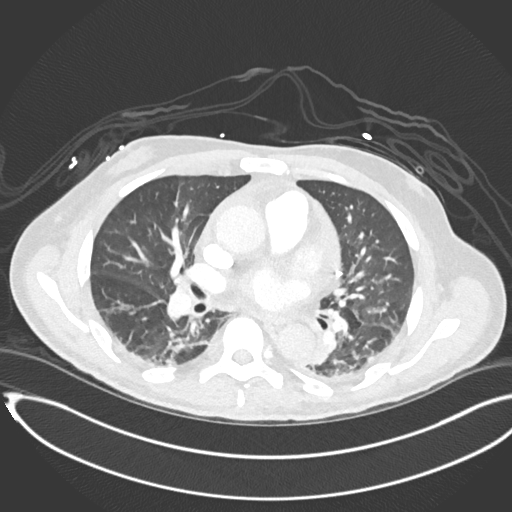
[im 249/452  mediastinal]
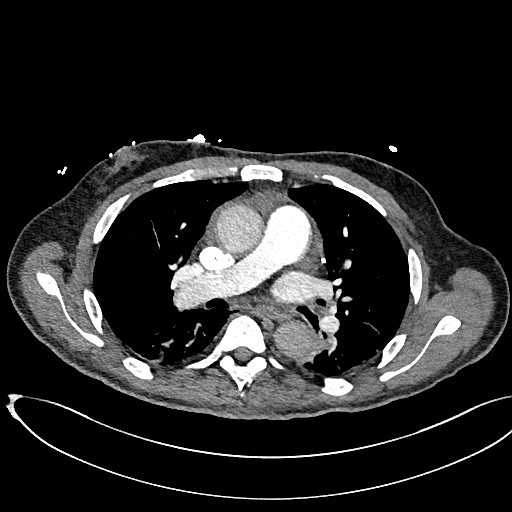
[im 271/452  lung]
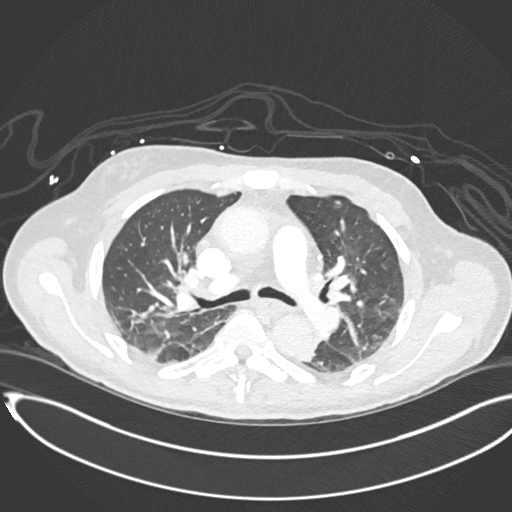
[im 294/452  mediastinal]
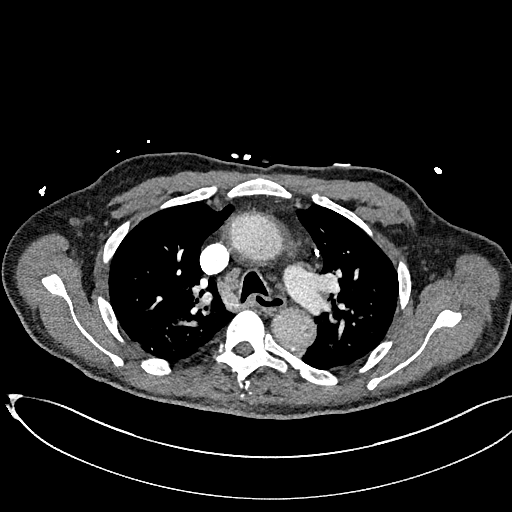
[im 316/452  lung]
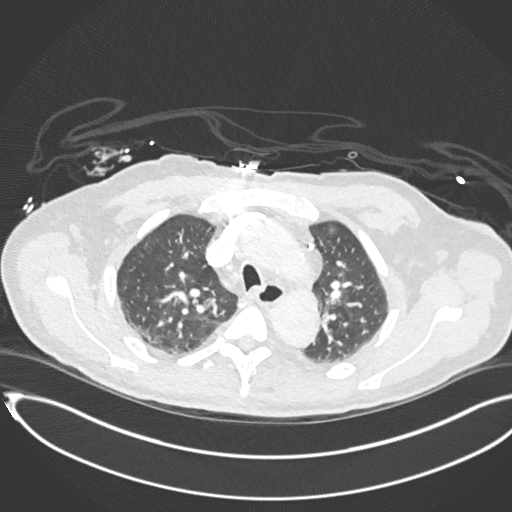
[im 361/452  mediastinal]
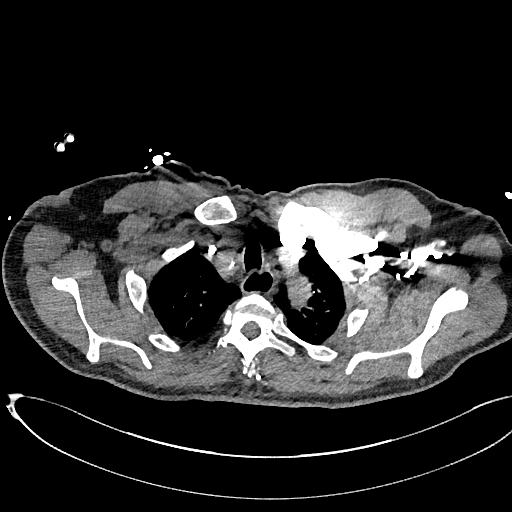
[im 384/452  lung]
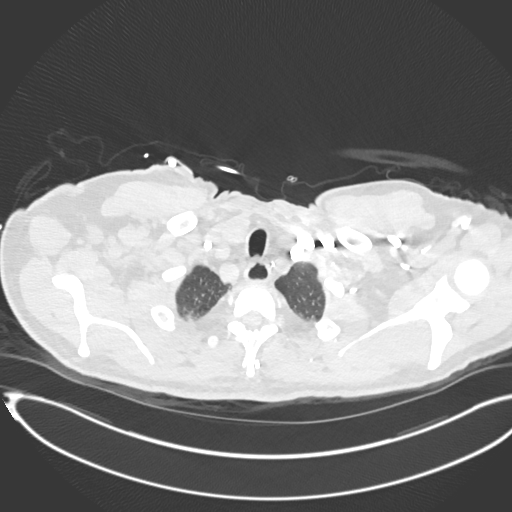
[im 406/452  mediastinal]
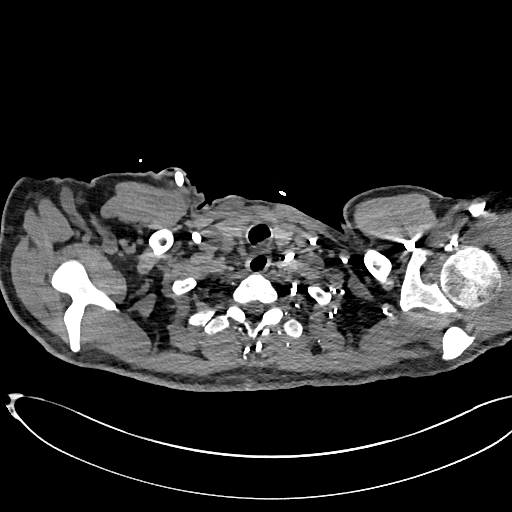
[im 429/452  lung]
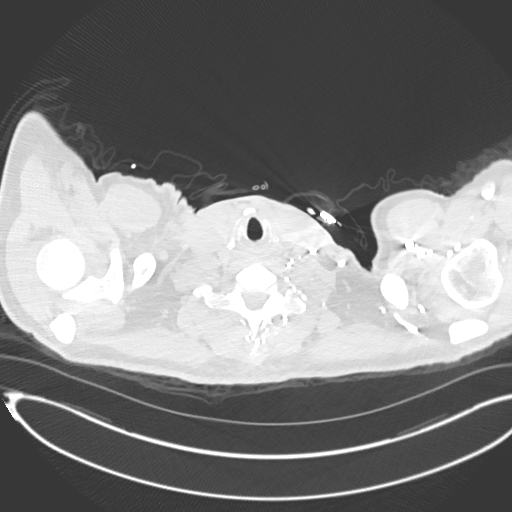

[Series 8: pe 2mm cor · coronal · 0.62mm/px · 1 of 145 slices shown]
[im 73/145  mediastinal]
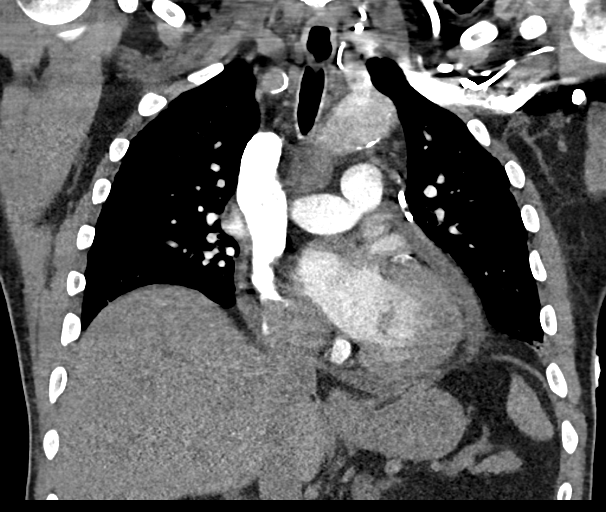

[18 of 36 positions shown; findings below may reference images not displayed]

FINDINGS: Cardiovascular: There is suboptimal opacification of the pulmonary
arterial tree with opacification adequate only for exclusion of
large central pulmonary emboli involving the main and central right
and left pulmonary arteries, of which there are none. The lobar,
segmental, and subsegmental pulmonary arteries are not adequately
opacified to definitively exclude the presence of small pulmonary
emboli.

Extensive multi-vessel coronary artery calcification. Global cardiac
size is within normal limits though left ventricular hypertrophy is
noted. Small pericardial effusion with no CT evidence of cardiac
tamponade. The central pulmonary arteries are of normal caliber.
Mild atherosclerotic calcification is seen within the thoracic
aorta. No aortic aneurysm. Right internal jugular hemodialysis
catheter tip is seen within the right atrium.

Mediastinum/Nodes: No pathologic thoracic adenopathy. Esophagus
unremarkable.

Lungs/Pleura: Mild bibasilar dependent atelectasis. No superimposed
focal pulmonary nodules or infiltrates. No pneumothorax or pleural
effusion.

Upper Abdomen: No acute abnormality identified within the visualized
upper abdomen.

Musculoskeletal: No acute bone abnormality. No lytic or blastic bone
lesions are identified.

Review of the MIP images confirms the above findings.
IMPRESSION: Technically limited examination without evidence of a large central
pulmonary embolism. The lobar, segmental, and subsegmental pulmonary
arteries are not adequately opacified to definitively exclude the
presence of small pulmonary emboli.

Extensive multi-vessel coronary artery calcification. Left
ventricular hypertrophy.

Small pericardial effusion without CT evidence of cardiac tamponade.

Mild bibasilar atelectasis.

Aortic Atherosclerosis ([id number]-151.1).

## 2021-09-03 ENCOUNTER — Ambulatory Visit: Payer: Medicare Other | Admitting: Podiatry

## 2021-09-08 ENCOUNTER — Ambulatory Visit (INDEPENDENT_AMBULATORY_CARE_PROVIDER_SITE_OTHER): Payer: Medicare Other | Admitting: Podiatry

## 2021-09-08 ENCOUNTER — Encounter: Payer: Self-pay | Admitting: Podiatry

## 2021-09-08 DIAGNOSIS — N186 End stage renal disease: Secondary | ICD-10-CM

## 2021-09-08 DIAGNOSIS — M79675 Pain in left toe(s): Secondary | ICD-10-CM | POA: Diagnosis not present

## 2021-09-08 DIAGNOSIS — Z992 Dependence on renal dialysis: Secondary | ICD-10-CM

## 2021-09-08 DIAGNOSIS — E0822 Diabetes mellitus due to underlying condition with diabetic chronic kidney disease: Secondary | ICD-10-CM

## 2021-09-08 DIAGNOSIS — B351 Tinea unguium: Secondary | ICD-10-CM | POA: Diagnosis not present

## 2021-09-08 DIAGNOSIS — M79674 Pain in right toe(s): Secondary | ICD-10-CM

## 2021-09-15 NOTE — Progress Notes (Signed)
  Subjective:  Patient ID: Jacob Daniel, male    DOB: 03/01/1956,  MRN: [id number]  Jacob Daniel presents to clinic today for at risk foot care with h/o NIDDM with ESRD on hemodialysis and painful thick toenails that are difficult to trim. Pain interferes with ambulation. Aggravating factors include wearing enclosed shoe gear. Pain is relieved with periodic professional debridement.  Patient states he just got back from visiting his son in [place].  New problem(s): None.   PCP is [name], [employer], PA-C .  No Known Allergies  Review of Systems: Negative except as noted in the HPI.  Objective: No changes noted in today's physical examination. Jacob Daniel is a pleasant 66 y.o. male in NAD. AAO X 3.  Vascular Examination: CFT immediate b/l LE. Palpable DP/PT pulses b/l LE. Digital hair sparse b/l. Skin temperature gradient WNL b/l. No pain with calf compression b/l. No edema noted b/l. No cyanosis or clubbing noted b/l LE.  Dermatological Examination: Pedal integument with normal turgor, texture and tone b/l LE. No open wounds b/l. No interdigital macerations b/l. Toenails 1-5 b/l elongated, thickened, discolored with subungual debris. +Tenderness with dorsal palpation of nailplates. No hyperkeratotic or porokeratotic lesions present.  Musculoskeletal Examination: Muscle strength 5/5 to all lower extremity muscle groups bilaterally. No pain, crepitus or joint limitation noted with ROM bilateral LE. HAV with bunion deformity noted b/l LE.  Neurological Examination: Protective sensation intact 5/5 intact bilaterally with 10g monofilament b/l. Vibratory sensation intact b/l.  Assessment/Plan: 1. Pain due to onychomycosis of toenails of both feet   2. Diabetes mellitus due to underlying condition with chronic kidney disease on chronic dialysis, without long-term current use of insulin ([place])     -Patient was evaluated and treated. All patient's and/or POA's questions/concerns  answered on today's visit. -Patient to continue soft, supportive shoe gear daily. -Mycotic toenails 1-5 bilaterally were debrided in length and girth with sterile nail nippers and dremel without incident. -Patient/POA to call should there be question/concern in the interim.   Return in about 3 months (around 12/09/2021).  Marzetta Board, DPM

## 2021-10-02 ENCOUNTER — Emergency Department (HOSPITAL_COMMUNITY)
Admission: EM | Admit: 2021-10-02 | Discharge: 2021-10-02 | Discharge status: Against Medical Advice | Payer: Medicare Other | Attending: Emergency Medicine | Admitting: Emergency Medicine

## 2021-10-02 ENCOUNTER — Emergency Department (HOSPITAL_COMMUNITY): Payer: Medicare Other

## 2021-10-02 ENCOUNTER — Other Ambulatory Visit: Payer: Self-pay

## 2021-10-02 ENCOUNTER — Encounter (HOSPITAL_COMMUNITY): Payer: Self-pay | Admitting: *Deleted

## 2021-10-02 DIAGNOSIS — Z992 Dependence on renal dialysis: Secondary | ICD-10-CM | POA: Insufficient documentation

## 2021-10-02 DIAGNOSIS — T8249XA Other complication of vascular dialysis catheter, initial encounter: Secondary | ICD-10-CM | POA: Diagnosis present

## 2021-10-02 DIAGNOSIS — Y732 Prosthetic and other implants, materials and accessory gastroenterology and urology devices associated with adverse incidents: Secondary | ICD-10-CM | POA: Diagnosis not present

## 2021-10-02 DIAGNOSIS — Z5321 Procedure and treatment not carried out due to patient leaving prior to being seen by health care provider: Secondary | ICD-10-CM | POA: Diagnosis not present

## 2021-10-02 LAB — CBC WITH DIFFERENTIAL/PLATELET
Abs Immature Granulocytes: 0.02 10*3/uL (ref 0.00–0.07)
Basophils Absolute: 0.1 10*3/uL (ref 0.0–0.1)
Basophils Relative: 1 %
Eosinophils Absolute: 0.5 10*3/uL (ref 0.0–0.5)
Eosinophils Relative: 8 %
HCT: 35.6 % — ABNORMAL LOW (ref 39.0–52.0)
Hemoglobin: 11.2 g/dL — ABNORMAL LOW (ref 13.0–17.0)
Immature Granulocytes: 0 %
Lymphocytes Relative: 21 %
Lymphs Abs: 1.5 10*3/uL (ref 0.7–4.0)
MCH: 28.1 pg (ref 26.0–34.0)
MCHC: 31.5 g/dL (ref 30.0–36.0)
MCV: 89.2 fL (ref 80.0–100.0)
Monocytes Absolute: 1 10*3/uL (ref 0.1–1.0)
Monocytes Relative: 14 %
Neutro Abs: 4.1 10*3/uL (ref 1.7–7.7)
Neutrophils Relative %: 56 %
Platelets: 177 10*3/uL (ref 150–400)
RBC: 3.99 MIL/uL — ABNORMAL LOW (ref 4.22–5.81)
RDW: 15.9 % — ABNORMAL HIGH (ref 11.5–15.5)
WBC: 7.2 10*3/uL (ref 4.0–10.5)
nRBC: 0 % (ref 0.0–0.2)

## 2021-10-02 LAB — BASIC METABOLIC PANEL
Anion gap: 20 — ABNORMAL HIGH (ref 5–15)
BUN: 51 mg/dL — ABNORMAL HIGH (ref 8–23)
CO2: 25 mmol/L (ref 22–32)
Calcium: 9.2 mg/dL (ref 8.9–10.3)
Chloride: 93 mmol/L — ABNORMAL LOW (ref 98–111)
Creatinine, Ser: 14.39 mg/dL — ABNORMAL HIGH (ref 0.61–1.24)
GFR, Estimated: 3 mL/min — ABNORMAL LOW (ref >60)
Glucose, Bld: 124 mg/dL — ABNORMAL HIGH (ref 70–99)
Potassium: 4.9 mmol/L (ref 3.5–5.1)
Sodium: 138 mmol/L (ref 135–145)

## 2021-10-02 NOTE — ED Notes (Signed)
Pt does not want to wait any longer. Has to leave

## 2021-10-02 NOTE — ED Provider Triage Note (Signed)
  Emergency Medicine Provider Triage Evaluation Note  MRN:  [id number]  Arrival date & time: 10/02/21    Medically screening exam initiated at 6:21 AM.   CC:   Vascular Access Problem   HPI:  Dalessandro Baldyga is a 66 y.o. year-old male presents to the ED with chief complaint of needing dialysis.  States he was dismissed from his dialysis center practice because he was too friendly with the nurses.  Denies any symptoms currently.  History provided by patient. ROS:  -As included in HPI PE:   Vitals:   10/02/21 0548  BP: (!) 180/102  Pulse: 84  Resp: 18  Temp: 98.2 F (36.8 C)  SpO2: 96%    Non-toxic appearing No respiratory distress  MDM:  Screening labs and imaging ordered.  Patient was informed that the remainder of the evaluation will be completed by another provider, this initial triage assessment does not replace that evaluation, and the importance of remaining in the ED until their evaluation is complete.    Montine Circle, PA-C 10/02/21 [phone]

## 2021-10-02 NOTE — ED Triage Notes (Signed)
Patient states he is here for dialysis , states he was dismissed from his dialysis center on Tues for interacting with male staff, states he was told to come here for dialysis today until they could find him another place. Denies complaints.

## 2021-10-04 ENCOUNTER — Emergency Department (HOSPITAL_COMMUNITY)
Admission: EM | Admit: 2021-10-04 | Discharge: 2021-10-05 | Disposition: A | Discharge status: Home / Self Care | Payer: Medicare Other | Attending: Emergency Medicine | Admitting: Emergency Medicine

## 2021-10-04 ENCOUNTER — Encounter (HOSPITAL_COMMUNITY): Payer: Self-pay | Admitting: Emergency Medicine

## 2021-10-04 ENCOUNTER — Other Ambulatory Visit: Payer: Self-pay

## 2021-10-04 ENCOUNTER — Emergency Department (HOSPITAL_COMMUNITY): Payer: Medicare Other

## 2021-10-04 DIAGNOSIS — Z992 Dependence on renal dialysis: Secondary | ICD-10-CM | POA: Diagnosis not present

## 2021-10-04 DIAGNOSIS — N186 End stage renal disease: Secondary | ICD-10-CM | POA: Diagnosis present

## 2021-10-04 DIAGNOSIS — E875 Hyperkalemia: Secondary | ICD-10-CM | POA: Insufficient documentation

## 2021-10-04 DIAGNOSIS — R0602 Shortness of breath: Secondary | ICD-10-CM | POA: Insufficient documentation

## 2021-10-04 DIAGNOSIS — Z79899 Other long term (current) drug therapy: Secondary | ICD-10-CM | POA: Diagnosis not present

## 2021-10-04 LAB — CBC WITH DIFFERENTIAL/PLATELET
Abs Immature Granulocytes: 0.12 10*3/uL — ABNORMAL HIGH (ref 0.00–0.07)
Basophils Absolute: 0.1 10*3/uL (ref 0.0–0.1)
Basophils Relative: 1 %
Eosinophils Absolute: 0.5 10*3/uL (ref 0.0–0.5)
Eosinophils Relative: 7 %
HCT: 30.4 % — ABNORMAL LOW (ref 39.0–52.0)
Hemoglobin: 9.8 g/dL — ABNORMAL LOW (ref 13.0–17.0)
Immature Granulocytes: 2 %
Lymphocytes Relative: 21 %
Lymphs Abs: 1.6 10*3/uL (ref 0.7–4.0)
MCH: 28.2 pg (ref 26.0–34.0)
MCHC: 32.2 g/dL (ref 30.0–36.0)
MCV: 87.4 fL (ref 80.0–100.0)
Monocytes Absolute: 0.6 10*3/uL (ref 0.1–1.0)
Monocytes Relative: 8 %
Neutro Abs: 4.8 10*3/uL (ref 1.7–7.7)
Neutrophils Relative %: 61 %
Platelets: 166 10*3/uL (ref 150–400)
RBC: 3.48 MIL/uL — ABNORMAL LOW (ref 4.22–5.81)
RDW: 15.9 % — ABNORMAL HIGH (ref 11.5–15.5)
WBC: 7.6 10*3/uL (ref 4.0–10.5)
nRBC: 0 % (ref 0.0–0.2)

## 2021-10-04 LAB — BASIC METABOLIC PANEL
Anion gap: 20 — ABNORMAL HIGH (ref 5–15)
BUN: 75 mg/dL — ABNORMAL HIGH (ref 8–23)
CO2: 20 mmol/L — ABNORMAL LOW (ref 22–32)
Calcium: 8.8 mg/dL — ABNORMAL LOW (ref 8.9–10.3)
Chloride: 92 mmol/L — ABNORMAL LOW (ref 98–111)
Creatinine, Ser: 18.14 mg/dL — ABNORMAL HIGH (ref 0.61–1.24)
GFR, Estimated: 3 mL/min — ABNORMAL LOW (ref >60)
Glucose, Bld: 99 mg/dL (ref 70–99)
Potassium: 5.2 mmol/L — ABNORMAL HIGH (ref 3.5–5.1)
Sodium: 132 mmol/L — ABNORMAL LOW (ref 135–145)

## 2021-10-04 LAB — HEPATITIS B SURFACE ANTIBODY,QUALITATIVE: Hep B S Ab: REACTIVE — AB

## 2021-10-04 LAB — HEPATITIS B SURFACE ANTIGEN: Hepatitis B Surface Ag: NONREACTIVE

## 2021-10-04 LAB — HEPATITIS B CORE ANTIBODY, TOTAL: Hep B Core Total Ab: NONREACTIVE

## 2021-10-04 LAB — HEPATITIS C ANTIBODY: HCV Ab: NONREACTIVE

## 2021-10-04 MED ORDER — LIDOCAINE HCL (PF) 1 % IJ SOLN: 5.0000 mL | INTRAMUSCULAR | Status: DC | PRN

## 2021-10-04 MED ORDER — CALCITRIOL 0.25 MCG PO CAPS
1.2500 ug | ORAL_CAPSULE | Freq: Once | ORAL | Status: AC
  Administered 2021-10-04: 1.25 ug via ORAL
  Filled 2021-10-04: qty 1

## 2021-10-04 MED ORDER — DARBEPOETIN ALFA 60 MCG/0.3ML IJ SOSY: 60.0000 ug | PREFILLED_SYRINGE | INTRAMUSCULAR | Status: DC

## 2021-10-04 MED ORDER — LIDOCAINE-PRILOCAINE 2.5-2.5 % EX CREA
1.0000 | TOPICAL_CREAM | CUTANEOUS | Status: DC | PRN
Start: 2021-10-04 — End: 2021-10-05

## 2021-10-04 MED ORDER — CHLORHEXIDINE GLUCONATE CLOTH 2 % EX PADS: 6.0000 | MEDICATED_PAD | Freq: Every day | CUTANEOUS | Status: DC

## 2021-10-04 MED ORDER — PENTAFLUOROPROP-TETRAFLUOROETH EX AERO: 1.0000 | INHALATION_SPRAY | CUTANEOUS | Status: DC | PRN

## 2021-10-04 NOTE — ED Notes (Signed)
Patient upset over wait time.. when explaining wait time and acuity and explained that he has a higher acuity but there are three ahead of him with that same acuity he states "this is bullshit" states "they dont give a f*ck about me."

## 2021-10-04 NOTE — ED Notes (Signed)
Consent for dialysis filled out and ready for signatures at bedside once nephrology talks to pt

## 2021-10-04 NOTE — ED Triage Notes (Signed)
Patient with some mild shortness of breath.  He is a dialysis patient, needs to have a treatment.  He does T,TH,Sat.  He came on Thursday but there was too much of a long wait so he left.

## 2021-10-04 NOTE — ED Provider Triage Note (Signed)
Emergency Medicine Provider Triage Evaluation Note  Jacob Daniel , a 66 y.o. male  was evaluated in triage.  Pt complains of needing dialysis. Last session completed 09/30/21.  He was recently dismissed from his center, told to come to the ED for treatment in the interim.  He came on Thursday but wait was too long so he left.  He does report feeling SOB but denies chest pain.  Review of Systems  Positive: Needs dialysis, SOB Negative: Chest pain, fever  Physical Exam  BP (!) 180/107 (BP Location: Right Arm)   Pulse 91   Temp 98.3 F (36.8 C)   Resp 16   SpO2 95%  Gen:   Awake, no distress   Resp:  Normal effort  MSK:   Moves extremities without difficulty  Other:  Fistula LUE, thrill present  Medical Decision Making  Medically screening exam initiated at 5:12 AM.  Appropriate orders placed.  Regenia Skeeter was informed that the remainder of the evaluation will be completed by another provider, this initial triage assessment does not replace that evaluation, and the importance of remaining in the ED until their evaluation is complete.  ESRD on HD-- needs dialysis.  No treatment since 09/30/21.  Feeling SOB but denies chest pain.  EKG, labs, CXR.   Larene Pickett, PA-C 10/04/21 [phone]

## 2021-10-04 NOTE — ED Provider Notes (Signed)
[facility] EMERGENCY DEPARTMENT Provider Note   CSN: [id number] Arrival date & time: 10/04/21  0449     History  Chief Complaint  Patient presents with   Vascular Access Problem    Jacob Daniel is a 66 y.o. male.  66 year old with a past medical history of ESRD currently on dialysis Tuesday, Thursday, Saturday presents to the ED requesting dialysis.  Patient reports he was in a romantic relationship with one of the staff members at dialysis, he was "kicked out of the unit ", he is not allowed to be dialyzed at that unit.  Reports his last dialysis occurred on Tuesday July 18, he was told that he could come to the emergency department to be dialyzed and arrived here today at 5 AM.  He is endorsing some shortness of breath, worsened with any type of activity.  Denies any chest pain, no fever, no other complaints.  The history is provided by the patient and medical records.       Home Medications Prior to Admission medications   Medication Sig Start Date End Date Taking? Authorizing Provider  amLODipine (NORVASC) 10 MG tablet Take 10 mg by mouth daily. 07/01/20 10/04/21 Yes [provider]  atorvastatin (LIPITOR) 10 MG tablet Take 10 mg by mouth daily. 07/02/20 10/04/21 Yes [provider]  calcium acetate (PHOSLO) 667 MG capsule Take 2,001 mg by mouth 3 (three) times daily with meals. 07/27/20  Yes [provider]  carvedilol (COREG) 25 MG tablet Take 50 mg by mouth at bedtime. 11/13/20 11/13/21 Yes [provider]  doxazosin (CARDURA) 4 MG tablet Take 4 mg by mouth at bedtime. 10/08/14  Yes [provider]  ferrous sulfate 325 (65 FE) MG EC tablet Take 325 mg by mouth. Takes with dialysis ( Tuesday,Thursday and Saturday) 08/21/20  Yes [provider]  indomethacin (INDOCIN) 25 MG capsule Take 25 mg by mouth 2 (two) times daily as needed (for gout flare (with a meal)).   Yes [provider]  magnesium oxide  (MAG-OX) 400 MG tablet Take 400 mg by mouth daily.  07/14/19  Yes [provider]  sildenafil (REVATIO) 20 MG tablet Take 20-60 mg by mouth daily as needed (erectile dysfunction). 06/03/20  Yes [provider]  sodium bicarbonate 650 MG tablet Take 650 mg by mouth 2 (two) times daily. 07/26/19  Yes [provider]  tacrolimus (PROGRAF) 1 MG capsule Take 1 mg by mouth daily. 10/08/14  Yes [provider]      Allergies    Patient has no known allergies.    Review of Systems   Review of Systems  Constitutional:  Negative for chills and fever.  Respiratory:  Positive for shortness of breath.   Cardiovascular:  Negative for chest pain.  Gastrointestinal:  Negative for abdominal pain, nausea and vomiting.  Genitourinary:  Negative for flank pain.  Musculoskeletal:  Negative for back pain.  All other systems reviewed and are negative.   Physical Exam Updated Vital Signs BP (!) 162/99 (BP Location: Right Arm)   Pulse 74   Temp (!) 97.5 F (36.4 C) (Oral)   Resp 16   Wt 93.1 kg   SpO2 94%   BMI 27.84 kg/m  Physical Exam Vitals and nursing note reviewed.  Constitutional:      Appearance: Normal appearance.  HENT:     Head: Normocephalic and atraumatic.     Nose: Nose normal.  Eyes:     Pupils: Pupils are equal, round,  and reactive to light.  Cardiovascular:     Rate and Rhythm: Normal rate.  Pulmonary:     Effort: Pulmonary effort is normal.     Breath sounds: No wheezing or rales.  Abdominal:     General: Abdomen is flat.     Tenderness: There is no abdominal tenderness.  Musculoskeletal:     Cervical back: Normal range of motion and neck supple.  Skin:    General: Skin is warm and dry.  Neurological:     Mental Status: He is alert and oriented to person, place, and time.     ED Results / Procedures / Treatments   Labs (all labs ordered are listed, but only abnormal results are displayed) Labs Reviewed  CBC WITH  DIFFERENTIAL/PLATELET - Abnormal; Notable for the following components:      Result Value   RBC 3.48 (*)    Hemoglobin 9.8 (*)    HCT 30.4 (*)    RDW 15.9 (*)    Abs Immature Granulocytes 0.12 (*)    All other components within normal limits  BASIC METABOLIC PANEL - Abnormal; Notable for the following components:   Sodium 132 (*)    Potassium 5.2 (*)    Chloride 92 (*)    CO2 20 (*)    BUN 75 (*)    Creatinine, Ser 18.14 (*)    Calcium 8.8 (*)    GFR, Estimated 3 (*)    Anion gap 20 (*)    All other components within normal limits  HEPATITIS B SURFACE ANTIBODY,QUALITATIVE - Abnormal; Notable for the following components:   Hep B S Ab Reactive (*)    All other components within normal limits  HEPATITIS B SURFACE ANTIGEN  HEPATITIS B CORE ANTIBODY, TOTAL  HEPATITIS C ANTIBODY  HEPATITIS B SURFACE ANTIBODY, QUANTITATIVE    EKG EKG Interpretation  Date/Time:  Saturday October 04 2021 05:11:05 EDT Ventricular Rate:  78 PR Interval:  254 QRS Duration: 86 QT Interval:  418 QTC Calculation: 476 R Axis:   35 Text Interpretation: Sinus rhythm with 1st degree A-V block Otherwise normal ECG When compared with ECG of 02-Oct-2021 06:01, PREVIOUS ECG IS PRESENT No significant change since last tracing Confirmed by Blanchie Dessert ([id number]) on 10/04/2021 4:00:36 PM  Radiology DG Chest 2 View  Result Date: 10/04/2021 CLINICAL DATA:  Shortness of breath. EXAM: CHEST - 2 VIEW COMPARISON:  10/02/2021 FINDINGS: Cardiopericardial silhouette is at upper limits of normal for size. There is pulmonary vascular congestion without overt pulmonary edema. No focal airspace consolidation. No pleural effusion. IMPRESSION: No active cardiopulmonary disease. Electronically Signed   By: Misty Stanley M.D.   On: 10/04/2021 05:47    Procedures Procedures    Medications Ordered in ED Medications  Chlorhexidine Gluconate Cloth 2 % PADS 6 each (has no administration in time range)   pentafluoroprop-tetrafluoroeth (GEBAUERS) aerosol 1 Application (has no administration in time range)  lidocaine (PF) (XYLOCAINE) 1 % injection 5 mL (has no administration in time range)  lidocaine-prilocaine (EMLA) cream 1 Application (has no administration in time range)  Darbepoetin Alfa (ARANESP) injection 60 mcg (has no administration in time range)  calcitRIOL (ROCALTROL) capsule 1.25 mcg (1.25 mcg Oral Given 10/04/21 1723)    ED Course/ Medical Decision Making/ A&P                           Medical Decision Making  This patient presents to the ED for concern of needs dialysis.  Co morbidities: Discussed in HPI   Brief History:  Patient here with ESRD with last session on September 30, 2021 here requesting dialysis.  Endorsing shortness of breath that occurs while ambulating.  Told to arrive to the ED on Saturday in order to receive dialysis.  EMR reviewed including pt PMHx, past surgical history and past visits to ER.   See HPI for more details   Lab Tests:  I ordered and independently interpreted labs.  The pertinent results include:    Labs reviewed and interpreted by me, electrolyte derangement consistent with hyperkalemia, has not had dialysis in 4 days now.  Creatinine is significantly elevated from his baseline at 18, he does void every few days, but states this is only a trickle.  CBC with no white count, hemoglobin at his baseline.   Imaging Studies: Xray of his chest without any acute findings.    Cardiac Monitoring:  The patient was maintained on a cardiac monitor.  I personally viewed and interpreted the cardiac monitored which showed an underlying rhythm of: NSR EKG non-ischemic  Consults:  4:24 PM I requested consultation with Nephrology,  and discussed lab and imaging findings as well as pertinent plan - they recommend: Patient having dialysis today.  Patient is not allowed to go back to dialysis unit, as he was running staff that he was "going to blow the  whole place up ".  On-call nephrologist will call clinic in order to attempt to dialyze patient.  Please see his note on chart.   Social Determinants of Health:   Patient without dialysis center after being involuntary dialysis this past Tuesday. Has a new dialysis center setup but cannot get in until Thursday, here with worsening symptoms.    Problem List / ED Course:  Patient here requesting dialysis after being involuntary discharge from dialysis on Tuesday.  Last session on Tuesday 19 2023.  Here endorsing some shortness of breath.  Labs with a elevate creatinine.  Elevated potassium.  Does complain of some shortness of breath.  He was consulted with nephrology will have dialysis today or sometime tonight.    Dispostion:  After consideration of the diagnostic results and the patients response to treatment, I feel that the patent would benefit from dialysis.   According to nephrology note patient will be dialyzed and likely will return to the ED and be discharged, he does have a dialysis center that will be Seeing him starting Thursday.   Portions of this note were generated with [employer]. Dictation errors may occur despite best attempts at proofreading.   Final Clinical Impression(s) / ED Diagnoses Final diagnoses:  ESRD (end stage renal disease) on dialysis [facility])    Rx / DC Orders ED Discharge Orders     None         Janeece Fitting, PA-C 10/04/21 2351    Blanchie Dessert, MD 10/05/21 2316

## 2021-10-04 NOTE — ED Notes (Signed)
Pt removed BP cuff

## 2021-10-04 NOTE — ED Notes (Signed)
Per hemodialysis, pt must have hepatitis labs drawn and resulted prior to pt receiving dialysis.

## 2021-10-04 NOTE — Progress Notes (Signed)
Pt here for routine dialysis. Was involuntary discharged from his HD unit. Has been accepted to [facility] but won't start there until next Thursday. Last HD reported to be Tuesday. Pt seen and examined in ER. AVF w/ +b/t, no sig edema. Does endorse some nausea. Will obtain records and arrange for HD today/tonight. Can be discharged from ER post-HD. Please call for formal consult if patient is to be admitted.  Gean Quint, MD [facility]

## 2021-10-05 DIAGNOSIS — N186 End stage renal disease: Secondary | ICD-10-CM | POA: Diagnosis not present

## 2021-10-05 NOTE — Discharge Instructions (Signed)
You were evaluated in the Emergency Department and after careful evaluation, we did not find any emergent condition requiring admission or further testing in the hospital.  Your exam/testing today was overall reassuring.  You were provided with dialysis through the emergency department.  Recommend continuing your normal dialysis schedule.  Please return to the Emergency Department if you experience any worsening of your condition.  Thank you for allowing [place] to be a part of your care.

## 2021-10-05 NOTE — ED Notes (Signed)
Pt returns from dialysis. Denies complaints. VS recorded

## 2021-10-05 NOTE — ED Provider Notes (Signed)
  Provider Note MRN:  [id number]  Arrival date & time: 10/05/21    ED Course and Medical Decision Making  Assumed care from [facility] and the nephrology team upon return from dialysis.  Patient is resting comfortably with normal vital signs, feels much better after dialysis, no complaints.  Appropriate for discharge.  Procedures  Final Clinical Impressions(s) / ED Diagnoses     ICD-10-CM   1. ESRD (end stage renal disease) on dialysis ([place])  N18.6    Z99.2       ED Discharge Orders     None         Discharge Instructions      You were evaluated in the Emergency Department and after careful evaluation, we did not find any emergent condition requiring admission or further testing in the hospital.  Your exam/testing today was overall reassuring.  You were provided with dialysis through the emergency department.  Recommend continuing your normal dialysis schedule.  Please return to the Emergency Department if you experience any worsening of your condition.  Thank you for allowing [place] to be a part of your care.     Barth Kirks. Sedonia Small, [place] [email or web address]    Maudie Flakes, MD 10/05/21 [phone]

## 2021-10-06 LAB — HEPATITIS B SURFACE ANTIBODY, QUANTITATIVE: Hep B S AB Quant (Post): 812.8 m[IU]/mL (ref >9.9)

## 2021-10-07 ENCOUNTER — Emergency Department (HOSPITAL_COMMUNITY)
Admission: EM | Admit: 2021-10-07 | Discharge: 2021-10-07 | Discharge status: Against Medical Advice | Payer: Medicare Other | Attending: Student | Admitting: Student

## 2021-10-07 ENCOUNTER — Emergency Department (HOSPITAL_COMMUNITY): Payer: Medicare Other

## 2021-10-07 ENCOUNTER — Other Ambulatory Visit: Payer: Self-pay

## 2021-10-07 ENCOUNTER — Encounter (HOSPITAL_COMMUNITY): Payer: Self-pay | Admitting: *Deleted

## 2021-10-07 DIAGNOSIS — Z992 Dependence on renal dialysis: Secondary | ICD-10-CM | POA: Diagnosis not present

## 2021-10-07 DIAGNOSIS — Z5321 Procedure and treatment not carried out due to patient leaving prior to being seen by health care provider: Secondary | ICD-10-CM | POA: Insufficient documentation

## 2021-10-07 DIAGNOSIS — R0602 Shortness of breath: Secondary | ICD-10-CM | POA: Insufficient documentation

## 2021-10-07 LAB — CBC WITH DIFFERENTIAL/PLATELET
Abs Immature Granulocytes: 0.03 10*3/uL (ref 0.00–0.07)
Basophils Absolute: 0.1 10*3/uL (ref 0.0–0.1)
Basophils Relative: 1 %
Eosinophils Absolute: 0.7 10*3/uL — ABNORMAL HIGH (ref 0.0–0.5)
Eosinophils Relative: 9 %
HCT: 31.2 % — ABNORMAL LOW (ref 39.0–52.0)
Hemoglobin: 9.9 g/dL — ABNORMAL LOW (ref 13.0–17.0)
Immature Granulocytes: 0 %
Lymphocytes Relative: 22 %
Lymphs Abs: 1.8 10*3/uL (ref 0.7–4.0)
MCH: 28.3 pg (ref 26.0–34.0)
MCHC: 31.7 g/dL (ref 30.0–36.0)
MCV: 89.1 fL (ref 80.0–100.0)
Monocytes Absolute: 0.9 10*3/uL (ref 0.1–1.0)
Monocytes Relative: 11 %
Neutro Abs: 4.6 10*3/uL (ref 1.7–7.7)
Neutrophils Relative %: 57 %
Platelets: 183 10*3/uL (ref 150–400)
RBC: 3.5 MIL/uL — ABNORMAL LOW (ref 4.22–5.81)
RDW: 16 % — ABNORMAL HIGH (ref 11.5–15.5)
WBC: 8.1 10*3/uL (ref 4.0–10.5)
nRBC: 0 % (ref 0.0–0.2)

## 2021-10-07 LAB — BASIC METABOLIC PANEL
Anion gap: 18 — ABNORMAL HIGH (ref 5–15)
BUN: 76 mg/dL — ABNORMAL HIGH (ref 8–23)
CO2: 23 mmol/L (ref 22–32)
Calcium: 9 mg/dL (ref 8.9–10.3)
Chloride: 94 mmol/L — ABNORMAL LOW (ref 98–111)
Creatinine, Ser: 17.13 mg/dL — ABNORMAL HIGH (ref 0.61–1.24)
GFR, Estimated: 3 mL/min — ABNORMAL LOW (ref >60)
Glucose, Bld: 135 mg/dL — ABNORMAL HIGH (ref 70–99)
Potassium: 4.9 mmol/L (ref 3.5–5.1)
Sodium: 135 mmol/L (ref 135–145)

## 2021-10-07 LAB — MAGNESIUM: Magnesium: 2.3 mg/dL (ref 1.7–2.4)

## 2021-10-07 NOTE — ED Notes (Signed)
Patient left without being seen, states "I waited 21 hours on Saturday and does not plan on waiting that long again. I will call rescue squad if I become short of breath before dialysis on Thursday."

## 2021-10-07 NOTE — ED Provider Triage Note (Signed)
Emergency Medicine Provider Triage Evaluation Note  Jacob Daniel , a 66 y.o. male  was evaluated in triage.  Pt complains of dialysis treatment, receives dialysis Tuesday Thursday Saturday, was kicked out of his dialysis center last Tuesday as it was known that he was having a relationship with one of the staff members, he had dialysis Saturday at [facility], states he is here for his scheduled dialysis treatment, there is shortness of breath no other complaints.  Review of Systems  Positive: Symptoms of breath, needing dialysis Negative: Leg swelling, stomach pain  Physical Exam  BP (!) 168/96 (BP Location: Left Arm)   Pulse 81   Temp 98.3 F (36.8 C) (Oral)   Resp 18   SpO2 100%  Gen:   Awake, no distress   Resp:  Normal effort  MSK:   Moves extremities without difficulty  Other:    Medical Decision Making  Medically screening exam initiated at 5:58 AM.  Appropriate orders placed.  Regenia Skeeter was informed that the remainder of the evaluation will be completed by another provider, this initial triage assessment does not replace that evaluation, and the importance of remaining in the ED until their evaluation is complete.  Presents needing dialysis, lab work imaging ordered will need further work-up.   Marcello Fennel, PA-C 10/07/21 0559

## 2021-10-07 NOTE — ED Triage Notes (Signed)
Pt states he needs dialysis. Last treatment on Saturday. Due to return to community dialysis in [place] on Thursday.

## 2021-12-15 ENCOUNTER — Ambulatory Visit (INDEPENDENT_AMBULATORY_CARE_PROVIDER_SITE_OTHER): Payer: Medicare Other | Admitting: Podiatry

## 2021-12-15 ENCOUNTER — Encounter: Payer: Self-pay | Admitting: Podiatry

## 2021-12-15 DIAGNOSIS — Z992 Dependence on renal dialysis: Secondary | ICD-10-CM | POA: Diagnosis not present

## 2021-12-15 DIAGNOSIS — E0822 Diabetes mellitus due to underlying condition with diabetic chronic kidney disease: Secondary | ICD-10-CM | POA: Diagnosis not present

## 2021-12-15 DIAGNOSIS — B351 Tinea unguium: Secondary | ICD-10-CM

## 2021-12-15 DIAGNOSIS — N186 End stage renal disease: Secondary | ICD-10-CM | POA: Diagnosis not present

## 2021-12-15 DIAGNOSIS — L84 Corns and callosities: Secondary | ICD-10-CM

## 2021-12-15 DIAGNOSIS — M79674 Pain in right toe(s): Secondary | ICD-10-CM

## 2021-12-15 DIAGNOSIS — M79675 Pain in left toe(s): Secondary | ICD-10-CM | POA: Diagnosis not present

## 2021-12-15 NOTE — Progress Notes (Signed)
  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 Pain interferes with ambulation. Aggravating factors include wearing enclosed shoe gear. Pain is relieved with periodic professional debridement.  Chief Complaint  Patient presents with   Nail Problem    Routine foot care PCP-Meg[facility] PCP VST- July or August 2023   New problem(s): None.   PCP is [name], [employer], PA-C.  No Known Allergies  Review of Systems: Negative except as noted in the HPI.  Objective: No changes noted in today's physical examination.  Jacob Daniel is a pleasant 66 y.o. male in NAD. AAO x 3.  Vascular Examination: CFT immediate b/l LE. Palpable DP/PT pulses b/l LE. Digital hair sparse b/l. Skin temperature gradient WNL b/l. No pain with calf compression b/l. No edema noted b/l. No cyanosis or clubbing noted b/l LE.  Dermatological Examination: Pedal integument with normal turgor, texture and tone b/l LE. No open wounds b/l. No interdigital macerations b/l. Toenails 1-5 b/l elongated, thickened, discolored with subungual debris. +Tenderness with dorsal palpation of nailplates.   Hyperkeratotic lesion(s) plantar IPJ of left great toe.  No erythema, no edema, no drainage, no fluctuance.  Musculoskeletal Examination: Muscle strength 5/5 to all lower extremity muscle groups bilaterally. No pain, crepitus or joint limitation noted with ROM bilateral LE. HAV with bunion deformity noted b/l LE.  Neurological Examination: Protective sensation intact 5/5 intact bilaterally with 10g monofilament b/l. Vibratory sensation intact b/l.  Assessment/Plan: 1. Pain due to onychomycosis of toenails of both feet   2. Callus   3. 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   -Consent given for treatment as described below: -Examined patient. -Continue foot and shoe inspections daily. Monitor blood glucose per PCP/Endocrinologist's recommendations. -Toenails 1-5 b/l were debrided in length and girth with sterile nail nippers and dremel without iatrogenic bleeding.  -Callus(es) L hallux pared utilizing sterile scalpel blade without complication or incident. Total number debrided =1. -Patient/POA to call should there be question/concern in the interim.   Return in about 3 months (around 03/17/2022).  Marzetta Board, DPM

## 2022-04-08 ENCOUNTER — Ambulatory Visit (INDEPENDENT_AMBULATORY_CARE_PROVIDER_SITE_OTHER): Payer: Medicare Other | Admitting: Podiatry

## 2022-04-08 DIAGNOSIS — Z91199 Patient's noncompliance with other medical treatment and regimen due to unspecified reason: Secondary | ICD-10-CM

## 2022-04-08 NOTE — Progress Notes (Signed)
1. No-show for appointment

## 2022-06-25 ENCOUNTER — Other Ambulatory Visit (HOSPITAL_COMMUNITY)
Admission: RE | Admit: 2022-06-25 | Discharge: 2022-06-25 | Disposition: A | Discharge status: Home / Self Care | Payer: 59 | Source: Ambulatory Visit | Attending: Nurse Practitioner | Admitting: Nurse Practitioner

## 2022-06-25 DIAGNOSIS — D649 Anemia, unspecified: Secondary | ICD-10-CM | POA: Diagnosis present

## 2022-06-25 LAB — OCCULT BLOOD X 1 CARD TO LAB, STOOL: Fecal Occult Bld: NEGATIVE

## 2022-06-29 ENCOUNTER — Encounter (HOSPITAL_COMMUNITY): Payer: Self-pay

## 2022-06-29 ENCOUNTER — Emergency Department (HOSPITAL_COMMUNITY)
Admission: EM | Admit: 2022-06-29 | Discharge: 2022-06-29 | Disposition: A | Discharge status: Home / Self Care | Payer: 59 | Attending: Emergency Medicine | Admitting: Emergency Medicine

## 2022-06-29 ENCOUNTER — Other Ambulatory Visit: Payer: Self-pay

## 2022-06-29 ENCOUNTER — Emergency Department (HOSPITAL_COMMUNITY): Payer: 59

## 2022-06-29 DIAGNOSIS — N186 End stage renal disease: Secondary | ICD-10-CM | POA: Diagnosis not present

## 2022-06-29 DIAGNOSIS — Z992 Dependence on renal dialysis: Secondary | ICD-10-CM | POA: Insufficient documentation

## 2022-06-29 DIAGNOSIS — Z79899 Other long term (current) drug therapy: Secondary | ICD-10-CM | POA: Insufficient documentation

## 2022-06-29 DIAGNOSIS — D649 Anemia, unspecified: Secondary | ICD-10-CM | POA: Diagnosis not present

## 2022-06-29 DIAGNOSIS — R0602 Shortness of breath: Secondary | ICD-10-CM | POA: Insufficient documentation

## 2022-06-29 DIAGNOSIS — I509 Heart failure, unspecified: Secondary | ICD-10-CM | POA: Insufficient documentation

## 2022-06-29 DIAGNOSIS — I132 Hypertensive heart and chronic kidney disease with heart failure and with stage 5 chronic kidney disease, or end stage renal disease: Secondary | ICD-10-CM | POA: Diagnosis not present

## 2022-06-29 LAB — CBC WITH DIFFERENTIAL/PLATELET
Abs Immature Granulocytes: 0.03 10*3/uL (ref 0.00–0.07)
Basophils Absolute: 0.1 10*3/uL (ref 0.0–0.1)
Basophils Relative: 1 %
Eosinophils Absolute: 0.5 10*3/uL (ref 0.0–0.5)
Eosinophils Relative: 7 %
HCT: 26.7 % — ABNORMAL LOW (ref 39.0–52.0)
Hemoglobin: 8.7 g/dL — ABNORMAL LOW (ref 13.0–17.0)
Immature Granulocytes: 0 %
Lymphocytes Relative: 11 %
Lymphs Abs: 0.8 10*3/uL (ref 0.7–4.0)
MCH: 27.7 pg (ref 26.0–34.0)
MCHC: 32.6 g/dL (ref 30.0–36.0)
MCV: 85 fL (ref 80.0–100.0)
Monocytes Absolute: 0.3 10*3/uL (ref 0.1–1.0)
Monocytes Relative: 4 %
Neutro Abs: 5.4 10*3/uL (ref 1.7–7.7)
Neutrophils Relative %: 77 %
Platelets: 176 10*3/uL (ref 150–400)
RBC: 3.14 MIL/uL — ABNORMAL LOW (ref 4.22–5.81)
RDW: 14.5 % (ref 11.5–15.5)
WBC: 7 10*3/uL (ref 4.0–10.5)
nRBC: 0 % (ref 0.0–0.2)

## 2022-06-29 LAB — MAGNESIUM: Magnesium: 2 mg/dL (ref 1.7–2.4)

## 2022-06-29 LAB — TROPONIN I (HIGH SENSITIVITY)
Troponin I (High Sensitivity): 38 ng/L — ABNORMAL HIGH (ref <18)
Troponin I (High Sensitivity): 40 ng/L — ABNORMAL HIGH (ref <18)

## 2022-06-29 LAB — CBC
HCT: 26.3 % — ABNORMAL LOW (ref 39.0–52.0)
Hemoglobin: 8.2 g/dL — ABNORMAL LOW (ref 13.0–17.0)
MCH: 27.2 pg (ref 26.0–34.0)
MCHC: 31.2 g/dL (ref 30.0–36.0)
MCV: 87.1 fL (ref 80.0–100.0)
Platelets: 174 10*3/uL (ref 150–400)
RBC: 3.02 MIL/uL — ABNORMAL LOW (ref 4.22–5.81)
RDW: 14.5 % (ref 11.5–15.5)
WBC: 6.3 10*3/uL (ref 4.0–10.5)
nRBC: 0 % (ref 0.0–0.2)

## 2022-06-29 LAB — BASIC METABOLIC PANEL
Anion gap: 18 — ABNORMAL HIGH (ref 5–15)
BUN: 32 mg/dL — ABNORMAL HIGH (ref 8–23)
CO2: 24 mmol/L (ref 22–32)
Calcium: 8.9 mg/dL (ref 8.9–10.3)
Chloride: 94 mmol/L — ABNORMAL LOW (ref 98–111)
Creatinine, Ser: 10.9 mg/dL — ABNORMAL HIGH (ref 0.61–1.24)
GFR, Estimated: 5 mL/min — ABNORMAL LOW (ref >60)
Glucose, Bld: 129 mg/dL — ABNORMAL HIGH (ref 70–99)
Potassium: 3.7 mmol/L (ref 3.5–5.1)
Sodium: 136 mmol/L (ref 135–145)

## 2022-06-29 LAB — RENAL FUNCTION PANEL
Albumin: 3 g/dL — ABNORMAL LOW (ref 3.5–5.0)
Anion gap: 12 (ref 5–15)
BUN: 31 mg/dL — ABNORMAL HIGH (ref 8–23)
CO2: 25 mmol/L (ref 22–32)
Calcium: 8.5 mg/dL — ABNORMAL LOW (ref 8.9–10.3)
Chloride: 97 mmol/L — ABNORMAL LOW (ref 98–111)
Creatinine, Ser: 10.82 mg/dL — ABNORMAL HIGH (ref 0.61–1.24)
GFR, Estimated: 5 mL/min — ABNORMAL LOW (ref >60)
Glucose, Bld: 109 mg/dL — ABNORMAL HIGH (ref 70–99)
Phosphorus: 3.9 mg/dL (ref 2.5–4.6)
Potassium: 3.6 mmol/L (ref 3.5–5.1)
Sodium: 134 mmol/L — ABNORMAL LOW (ref 135–145)

## 2022-06-29 LAB — BRAIN NATRIURETIC PEPTIDE: B Natriuretic Peptide: 2310.7 pg/mL — ABNORMAL HIGH (ref 0.0–100.0)

## 2022-06-29 LAB — HEPATITIS B SURFACE ANTIGEN: Hepatitis B Surface Ag: NONREACTIVE

## 2022-06-29 MED ORDER — IPRATROPIUM-ALBUTEROL 0.5-2.5 (3) MG/3ML IN SOLN
3.0000 mL | Freq: Once | RESPIRATORY_TRACT | Status: AC
  Administered 2022-06-29: 3 mL via RESPIRATORY_TRACT
  Filled 2022-06-29: qty 3

## 2022-06-29 MED ORDER — PENTAFLUOROPROP-TETRAFLUOROETH EX AERO: 1.0000 | INHALATION_SPRAY | CUTANEOUS | Status: DC | PRN

## 2022-06-29 MED ORDER — CHLORHEXIDINE GLUCONATE CLOTH 2 % EX PADS: 6.0000 | MEDICATED_PAD | Freq: Every day | CUTANEOUS | Status: DC

## 2022-06-29 MED ORDER — ANTICOAGULANT SODIUM CITRATE 4% (200MG/5ML) IV SOLN: 5.0000 mL | Status: DC | PRN

## 2022-06-29 MED ORDER — ALBUTEROL SULFATE (2.5 MG/3ML) 0.083% IN NEBU
5.0000 mg | INHALATION_SOLUTION | Freq: Once | RESPIRATORY_TRACT | Status: AC
  Administered 2022-06-29: 5 mg via RESPIRATORY_TRACT
  Filled 2022-06-29: qty 6

## 2022-06-29 MED ORDER — HEPARIN SODIUM (PORCINE) 1000 UNIT/ML DIALYSIS
3000.0000 [IU] | Freq: Once | INTRAMUSCULAR | Status: AC
  Administered 2022-06-29: 3000 [IU] via INTRAVENOUS_CENTRAL
  Filled 2022-06-29 (×2): qty 3

## 2022-06-29 MED ORDER — LIDOCAINE HCL (PF) 1 % IJ SOLN: 5.0000 mL | INTRAMUSCULAR | Status: DC | PRN

## 2022-06-29 MED ORDER — ALTEPLASE 2 MG IJ SOLR: 2.0000 mg | Freq: Once | INTRAMUSCULAR | Status: DC | PRN

## 2022-06-29 MED ORDER — HEPARIN SODIUM (PORCINE) 1000 UNIT/ML DIALYSIS: 1000.0000 [IU] | INTRAMUSCULAR | Status: DC | PRN

## 2022-06-29 MED ORDER — LIDOCAINE-PRILOCAINE 2.5-2.5 % EX CREA: 1.0000 | TOPICAL_CREAM | CUTANEOUS | Status: DC | PRN

## 2022-06-29 NOTE — ED Provider Notes (Signed)
[place] EMERGENCY DEPARTMENT AT [facility] Provider Note   CSN: [id number] Arrival date & time: 06/29/22  4098     History  Chief Complaint  Patient presents with   Shortness of Breath    Jacob Daniel is a 67 y.o. male.  HPI   Patient with medical history including end-stage renal disease on dialysis, CHF, hypertension, status post kidney transplant currently on immunosuppressants present complaints of shortness of breath, states it started earlier this morning, states he just has difficulty breathing, denies any chest pain or pleuritic chest pain, denies any worsening peripheral edema, he denies any change in weight, states he has been compliant with his dialysis sessions, last 1 was on Friday, he states that over the last 2 weeks he is beginning more short of breath the night prior to his dialysis treatment, he states he feels his dialysis treatments are not lasting as long as it used to.  Patient not endorsing any fevers chills cough congestion stomach pains nausea vomiting diarrhea.    Home Medications Prior to Admission medications   Medication Sig Start Date End Date Taking? Authorizing Provider  amLODipine (NORVASC) 10 MG tablet Take 10 mg by mouth daily. 07/01/20 10/04/21  [provider]  atorvastatin (LIPITOR) 10 MG tablet Take 10 mg by mouth daily. 07/02/20 10/04/21  [provider]  calcium acetate (PHOSLO) 667 MG capsule Take 2,001 mg by mouth 3 (three) times daily with meals. 07/27/20   [provider]  carvedilol (COREG) 25 MG tablet Take 50 mg by mouth at bedtime. 11/13/20 11/13/21  [provider]  doxazosin (CARDURA) 4 MG tablet Take 4 mg by mouth at bedtime. 10/08/14   [provider]  ferrous sulfate 325 (65 FE) MG EC tablet Take 325 mg by mouth. Takes with dialysis ( Tuesday,Thursday and Saturday) 08/21/20   [provider]  indomethacin (INDOCIN) 25 MG capsule Take 25 mg by mouth 2 (two) times daily as  needed (for gout flare (with a meal)).    [provider]  magnesium oxide (MAG-OX) 400 MG tablet Take 400 mg by mouth daily.  07/14/19   [provider]  sildenafil (REVATIO) 20 MG tablet Take 20-60 mg by mouth daily as needed (erectile dysfunction). 06/03/20   [provider]  sodium bicarbonate 650 MG tablet Take 650 mg by mouth 2 (two) times daily. 07/26/19   [provider]  tacrolimus (PROGRAF) 1 MG capsule Take 1 mg by mouth daily. 10/08/14   [provider]      Allergies    Patient has no known allergies.    Review of Systems   Review of Systems  Constitutional:  Negative for chills and fever.  Respiratory:  Positive for shortness of breath.   Cardiovascular:  Negative for chest pain.  Gastrointestinal:  Negative for abdominal pain.  Neurological:  Negative for headaches.    Physical Exam Updated Vital Signs BP (!) 150/86   Pulse 68   Temp (!) 97.4 F (36.3 C)   Resp (!) 23   Ht 6' (1.829 m)   Wt 96 kg   SpO2 100%   BMI 28.70 kg/m  Physical Exam Vitals and nursing note reviewed.  Constitutional:      General: He is not in acute distress.    Appearance: He is not ill-appearing.  HENT:     Head: Normocephalic and atraumatic.     Nose: No congestion.  Eyes:     Conjunctiva/sclera: Conjunctivae normal.  Cardiovascular:  Rate and Rhythm: Normal rate and regular rhythm.     Pulses: Normal pulses.     Heart sounds: No murmur heard.    No friction rub. No gallop.  Pulmonary:     Effort: No respiratory distress.     Breath sounds: No wheezing, rhonchi or rales.     Comments: Tight sounding, expiratory wheezing heard bilaterally, slightly tachypneic, slight Rales heard in the lower lobes bilaterally.  Currently requiring supplemental oxygen on 2 L via nasal cannula. Abdominal:     Palpations: Abdomen is soft.     Tenderness: There is no abdominal tenderness. There is no right CVA tenderness or left CVA tenderness.   Musculoskeletal:     Right lower leg: No edema.     Left lower leg: No edema.  Skin:    General: Skin is warm and dry.  Neurological:     Mental Status: He is alert.  Psychiatric:        Mood and Affect: Mood normal.     ED Results / Procedures / Treatments   Labs (all labs ordered are listed, but only abnormal results are displayed) Labs Reviewed  BASIC METABOLIC PANEL - Abnormal; Notable for the following components:      Result Value   Chloride 94 (*)    Glucose, Bld 129 (*)    BUN 32 (*)    Creatinine, Ser 10.90 (*)    GFR, Estimated 5 (*)    Anion gap 18 (*)    All other components within normal limits  BRAIN NATRIURETIC PEPTIDE - Abnormal; Notable for the following components:   B Natriuretic Peptide 2,310.7 (*)    All other components within normal limits  CBC WITH DIFFERENTIAL/PLATELET - Abnormal; Notable for the following components:   RBC 3.14 (*)    Hemoglobin 8.7 (*)    HCT 26.7 (*)    All other components within normal limits  RENAL FUNCTION PANEL - Abnormal; Notable for the following components:   Sodium 134 (*)    Chloride 97 (*)    Glucose, Bld 109 (*)    BUN 31 (*)    Creatinine, Ser 10.82 (*)    Calcium 8.5 (*)    Albumin 3.0 (*)    GFR, Estimated 5 (*)    All other components within normal limits  CBC - Abnormal; Notable for the following components:   RBC 3.02 (*)    Hemoglobin 8.2 (*)    HCT 26.3 (*)    All other components within normal limits  TROPONIN I (HIGH SENSITIVITY) - Abnormal; Notable for the following components:   Troponin I (High Sensitivity) 38 (*)    All other components within normal limits  TROPONIN I (HIGH SENSITIVITY) - Abnormal; Notable for the following components:   Troponin I (High Sensitivity) 40 (*)    All other components within normal limits  MAGNESIUM  HEPATITIS B SURFACE ANTIGEN  HEPATITIS B SURFACE ANTIBODY, QUANTITATIVE    EKG EKG Interpretation  Date/Time:  Monday June 29 2022 02:23:33  EDT Ventricular Rate:  79 PR Interval:  210 QRS Duration: 125 QT Interval:  461 QTC Calculation: 529 R Axis:   66 Text Interpretation: Sinus rhythm No significant change was found Confirmed by Drema Pry [id number]) on 06/29/2022 4:59:20 AM  Radiology DG Chest 1 View  Result Date: 06/29/2022 CLINICAL DATA:  Shortness of breath EXAM: CHEST  1 VIEW COMPARISON:  10/07/2021 FINDINGS: Stable cardiomediastinal silhouette. Patchy bilateral airspace and interstitial opacities greatest in the lung bases. Possible  small left pleural effusion. No pneumothorax. Nondisplaced rib fractures. IMPRESSION: Patchy bilateral airspace and interstitial opacities greatest in the lung bases, may represent edema or infection. Electronically Signed   By: Minerva Fester M.D.   On: 06/29/2022 02:59    Procedures .Critical Care  Performed by: Carroll Sage, PA-C Authorized by: Carroll Sage, PA-C   Critical care provider statement:    Critical care time (minutes):  30   Critical care was necessary to treat or prevent imminent or life-threatening deterioration of the following conditions:  Respiratory failure   Critical care was time spent personally by me on the following activities:  Development of treatment plan with patient or surrogate, discussions with consultants, evaluation of patient's response to treatment, examination of patient, ordering and review of laboratory studies, ordering and review of radiographic studies, ordering and performing treatments and interventions, pulse oximetry, re-evaluation of patient's condition and review of old charts   I assumed direction of critical care for this patient from another provider in my specialty: no       Medications Ordered in ED Medications  Chlorhexidine Gluconate Cloth 2 % PADS 6 each (has no administration in time range)  pentafluoroprop-tetrafluoroeth (GEBAUERS) aerosol 1 Application (has no administration in time range)  lidocaine (PF)  (XYLOCAINE) 1 % injection 5 mL (has no administration in time range)  lidocaine-prilocaine (EMLA) cream 1 Application (has no administration in time range)  heparin injection 1,000 Units (has no administration in time range)  anticoagulant sodium citrate solution 5 mL (has no administration in time range)  alteplase (CATHFLO ACTIVASE) injection 2 mg (has no administration in time range)  heparin injection 3,000 Units (has no administration in time range)  ipratropium-albuterol (DUONEB) 0.5-2.5 (3) MG/3ML nebulizer solution 3 mL (3 mLs Nebulization Given 06/29/22 0352)  albuterol (PROVENTIL) (2.5 MG/3ML) 0.083% nebulizer solution 5 mg (5 mg Nebulization Given 06/29/22 0459)    ED Course/ Medical Decision Making/ A&P                             Medical Decision Making Amount and/or Complexity of Data Reviewed Labs: ordered. Radiology: ordered.  Risk Prescription drug management.   This patient presents to the ED for concern of shortness of breath, this involves an extensive number of treatment options, and is a complaint that carries with it a high risk of complications and morbidity.  The differential diagnosis includes CHF, COPD, ACS, PE, emergent dialysis    Additional history obtained:  Additional history obtained from N/A External records from outside source obtained and reviewed including transplants notes   Co morbidities that complicate the patient evaluation  End-stage renal disease, immunosuppressed, CHF  Social Determinants of Health:  N/A    Lab Tests:  I Ordered, and personally interpreted labs.  The pertinent results include: CBC shows normocytic anemia hemoglobin 8.7, slightly below baseline, BMP reveals glucose of 129 BUN 32, creatinine 10, BNP 2310, troponin 38, magnesium 2, second troponin is 40   Imaging Studies ordered:  I ordered imaging studies including chest x-ray I independently visualized and interpreted imaging which showed vascular  congestion I agree with the radiologist interpretation   Cardiac Monitoring:  The patient was maintained on a cardiac monitor.  I personally viewed and interpreted the cardiac monitored which showed an underlying rhythm of: Without signs of ischemia   Medicines ordered and prescription drug management:  I ordered medication including bronchial bronchodilators I have reviewed the patients home medicines  and have made adjustments as needed  Critical Interventions:  Hypoxic will place on supplemental oxygen While on supplemental oxygen O2 sats remained stable patient is resting comfortably   Reevaluation:  Presents with shortness of breath, initially present nonrebreather, wean patient down to 2 L via nasal cannula, slightly tight sounding my exam, will provide with a bronchodilator, obtain basic lab workup  Lab work consistent with volume overload likely from end-stage renal disease, will consult nephrology for dialysis  Consultations Obtained:  I requested consultation with the Dr. Juel Burrow,  and discussed lab and imaging findings as well as pertinent plan - they recommend: Will schedule patient for dialysis.    Test Considered:  N/A    Rule out I have low suspicion for ACS as history is atypical,, EKG was sinus rhythm without signs of ischemia.   Troponins are slightly elevated have plateaued and  appears to be baseline, I suspect the elevation is likely from volume overload as well as poor clearance from kidneys.  Low suspicion for PE as patient denies pleuritic chest pain, presentation more consistent with likely volume overload as he has noted vascular congestion seen on chest x-ray as well is an elevated BNP, patient still is short of breath despite treatment would consider further evaluation with possible CT imaging. low suspicion for AAA or aortic dissection as history is atypical, patient has low risk factors.  Low suspicion for systemic infection as patient is  nontoxic-appearing, vital signs reassuring, no obvious source infection noted on exam.     Dispostion and problem list  Due to shift change patient be handed off to Langston Masker, PA-C  Reassessed patient after dialysis if hemodynamically stable feeling better can be discharged home.            Final Clinical Impression(s) / ED Diagnoses Final diagnoses:  Shortness of breath  ESRD (end stage renal disease)    Rx / DC Orders ED Discharge Orders     None         Carroll Sage, PA-C 06/29/22 0454    Nira Conn, MD 06/29/22 [phone]

## 2022-06-29 NOTE — ED Triage Notes (Signed)
Pt BIBEMS  w/ c/o  SOB which started approx 3hrs PTA. As per pt this SOB episode usually occur just before his HD days (M,W,F)

## 2022-06-29 NOTE — ED Provider Notes (Signed)
Pt returned from dialysis.  Pt reports he feels better.  Pt wants to leave because his ride is waiting.  Pt denies any complaints.   Jacob Daniel, [name] 06/29/22 1352    [name], MD 06/30/22 [phone]

## 2022-06-29 NOTE — Consult Note (Signed)
Reason for Consult:  ESRD Referring Physician:  Dr. Berle Mull  Chief Complaint: Shortness of breath  Assessment/Plan: Shortness of breath:  CXR consistent with pulmonary edema; patchy bilateral airspace and interstitial opacities greatest in the bases but no fever/chills/cough. In addition he states that he has been having shortness of breath before dialysis treatments which is markedly improved midway through treatments.    ESRD:  Dialyzes at [facility] on MWF schedule. Hx kidney transplant listed in HPI currently still on Prograf 1mg  qdaily. Continue dialysis on MWF schedule, ok to discharge from a renal standpoint if SOB is improved. Likely will need a new EDW as he has not been cramping at the end of treatment with marked improvement in dyspnea midway through treatments for the past few weeks. History of kidney transplant: Received kidney transplant in 09/2008 which ultimately failed in 2011. Remains on prograf.   Hypertension/volume:  Continue home meds, recommend coontinuing to challenge UF as tolerated.   Anemia: Hgb 9.5. Will obtain outpatient ESA records.   Metabolic bone disease: Calcium controlled. Continue binders while he is in the hospital. Will check a Phosphorus if he's admitted.  Nutrition:  Renal diet/fluid restrictions recommended  HPI: Jacob Daniel is an 67 y.o. male HFpEF (as of 2017), HTN,  s/p kidney transplant in ~2010 which lasted 11 years before he had to restart dialysis in 2021. He has been maintained on Prograf 1mg  daily to decrease autoantibodies for possibility of another renal transplant; he has already been referred for a 2nd renal transplant. Patient is usually MWF @ [facility] and has not missed a treatment with his last treatment last Friday. He does not sign off early, dialyzing for 4hrs, and actually left dialysis below his listed EDW of 89.5kg. He states that he has been having shortness of breath before dialysis treatments which is markedly improved  midway through treatments. He denies chest pain, fever, chills, nausea, diarrhea; appetite was poor for a few days before improving 1-2 days ago. He denies any cramping or dizziness post treatments.  ROS Pertinent items are noted in HPI.  Chemistry and CBC: Creatinine, Ser  Date/Time Value Ref Range Status  06/29/2022 03:03 AM 10.90 (H) 0.61 - 1.24 mg/dL Final  70/26/3785 88:50 AM 17.13 (H) 0.61 - 1.24 mg/dL Final  27/74/1287 86:76 AM 18.14 (H) 0.61 - 1.24 mg/dL Final  72/11/4707 62:83 AM 14.39 (H) 0.61 - 1.24 mg/dL Final  66/29/4765 46:50 AM 9.79 (H) 0.61 - 1.24 mg/dL Final  35/46/5681 27:51 AM 5.39 (H) 0.61 - 1.24 mg/dL Final    Comment:    DELTA CHECK NOTED  01/17/2020 08:36 AM 9.59 (H) 0.61 - 1.24 mg/dL Final   Recent Labs  Lab 06/29/22 0303  NA 136  K 3.7  CL 94*  CO2 24  GLUCOSE 129*  BUN 32*  CREATININE 10.90*  CALCIUM 8.9   Recent Labs  Lab 06/29/22 0303  WBC 7.0  NEUTROABS 5.4  HGB 8.7*  HCT 26.7*  MCV 85.0  PLT 176   Liver Function Tests: No results for input(s): "AST", "ALT", "ALKPHOS", "BILITOT", "PROT", "ALBUMIN" in the last 168 hours. No results for input(s): "LIPASE", "AMYLASE" in the last 168 hours. No results for input(s): "AMMONIA" in the last 168 hours. Cardiac Enzymes: No results for input(s): "CKTOTAL", "CKMB", "CKMBINDEX", "TROPONINI" in the last 168 hours. Iron Studies: No results for input(s): "IRON", "TIBC", "TRANSFERRIN", "FERRITIN" in the last 72 hours. PT/INR: @LABRCNTIP (inr:5)  Xrays/Other Studies: ) Results for orders placed or performed during the hospital  encounter of 06/29/22 (from the past 48 hour(s))  Basic metabolic panel     Status: Abnormal   Collection Time: 06/29/22  3:03 AM  Result Value Ref Range   Sodium 136 135 - 145 mmol/L   Potassium 3.7 3.5 - 5.1 mmol/L   Chloride 94 (L) 98 - 111 mmol/L   CO2 24 22 - 32 mmol/L   Glucose, Bld 129 (H) 70 - 99 mg/dL    Comment: Glucose reference range applies only to samples  taken after fasting for at least 8 hours.   BUN 32 (H) 8 - 23 mg/dL   Creatinine, Ser 16.10 (H) 0.61 - 1.24 mg/dL   Calcium 8.9 8.9 - 96.0 mg/dL   GFR, Estimated 5 (L) >60 mL/min    Comment: (NOTE) Calculated using the CKD-EPI Creatinine Equation (2021)    Anion gap 18 (H) 5 - 15    Comment: Performed at [facility], [address] [place], [place] [address]  Brain natriuretic peptide     Status: Abnormal   Collection Time: 06/29/22  3:03 AM  Result Value Ref Range   B Natriuretic Peptide 2,310.7 (H) 0.0 - 100.0 pg/mL    Comment: Performed at [facility], [address] [place], [place] [address]  CBC with Differential     Status: Abnormal   Collection Time: 06/29/22  3:03 AM  Result Value Ref Range   WBC 7.0 4.0 - 10.5 K/uL   RBC 3.14 (L) 4.22 - 5.81 MIL/uL   Hemoglobin 8.7 (L) 13.0 - 17.0 g/dL   HCT 47.8 (L) 29.5 - 62.1 %   MCV 85.0 80.0 - 100.0 fL   MCH 27.7 26.0 - 34.0 pg   MCHC 32.6 30.0 - 36.0 g/dL   RDW 30.8 65.7 - 84.6 %   Platelets 176 150 - 400 K/uL   nRBC 0.0 0.0 - 0.2 %   Neutrophils Relative % 77 %   Neutro Abs 5.4 1.7 - 7.7 K/uL   Lymphocytes Relative 11 %   Lymphs Abs 0.8 0.7 - 4.0 K/uL   Monocytes Relative 4 %   Monocytes Absolute 0.3 0.1 - 1.0 K/uL   Eosinophils Relative 7 %   Eosinophils Absolute 0.5 0.0 - 0.5 K/uL   Basophils Relative 1 %   Basophils Absolute 0.1 0.0 - 0.1 K/uL   Immature Granulocytes 0 %   Abs Immature Granulocytes 0.03 0.00 - 0.07 K/uL    Comment: Performed at [facility], [address], [place], [place] [address]  Troponin I (High Sensitivity)     Status: Abnormal   Collection Time: 06/29/22  3:03 AM  Result Value Ref Range   Troponin I (High Sensitivity) 38 (H) <18 ng/L    Comment: (NOTE) Elevated high sensitivity troponin I (hsTnI) values and significant  changes across serial measurements may suggest ACS but many other  chronic and acute conditions are known to elevate hsTnI results.  Refer to the  "Links" section for chest pain algorithms and additional  guidance. Performed at [facility], [address], [place], [place] [address]   Magnesium     Status: None   Collection Time: 06/29/22  3:03 AM  Result Value Ref Range   Magnesium 2.0 1.7 - 2.4 mg/dL    Comment: Performed at [facility], [address] [place], [place] [address]   DG Chest 1 View  Result Date: 06/29/2022 CLINICAL DATA:  Shortness of breath EXAM: CHEST  1 VIEW  COMPARISON:  10/07/2021 FINDINGS: Stable cardiomediastinal silhouette. Patchy bilateral airspace and interstitial opacities greatest in the lung bases. Possible small left pleural effusion. No pneumothorax. Nondisplaced rib fractures. IMPRESSION: Patchy bilateral airspace and interstitial opacities greatest in the lung bases, may represent edema or infection. Electronically Signed   By: Minerva Fester M.D.   On: 06/29/2022 02:59    PMH:   Past Medical History:  Diagnosis Date   Arthritis    CHF (congestive heart failure)    Chronic kidney disease    Diabetes mellitus without complication    Hypertension     PSH:   Past Surgical History:  Procedure Laterality Date   KIDNEY TRANSPLANT  2010   SPINE SURGERY      Allergies: No Known Allergies  Medications:   Prior to Admission medications   Medication Sig Start Date End Date Taking? Authorizing Provider  amLODipine (NORVASC) 10 MG tablet Take 10 mg by mouth daily. 07/01/20 10/04/21  [provider]  atorvastatin (LIPITOR) 10 MG tablet Take 10 mg by mouth daily. 07/02/20 10/04/21  [provider]  calcium acetate (PHOSLO) 667 MG capsule Take 2,001 mg by mouth 3 (three) times daily with meals. 07/27/20   [provider]  carvedilol (COREG) 25 MG tablet Take 50 mg by mouth at bedtime. 11/13/20 11/13/21  [provider]  doxazosin (CARDURA) 4 MG tablet Take 4 mg by mouth at bedtime. 10/08/14   [provider]  ferrous sulfate 325 (65 FE) MG EC tablet  Take 325 mg by mouth. Takes with dialysis ( Tuesday,Thursday and Saturday) 08/21/20   [provider]  indomethacin (INDOCIN) 25 MG capsule Take 25 mg by mouth 2 (two) times daily as needed (for gout flare (with a meal)).    [provider]  magnesium oxide (MAG-OX) 400 MG tablet Take 400 mg by mouth daily.  07/14/19   [provider]  sildenafil (REVATIO) 20 MG tablet Take 20-60 mg by mouth daily as needed (erectile dysfunction). 06/03/20   [provider]  sodium bicarbonate 650 MG tablet Take 650 mg by mouth 2 (two) times daily. 07/26/19   [provider]  tacrolimus (PROGRAF) 1 MG capsule Take 1 mg by mouth daily. 10/08/14   [provider]    Discontinued Meds:  There are no discontinued medications.  Social History:  reports that he has never smoked. He has never used smokeless tobacco. He reports current alcohol use of about 1.0 standard drink of alcohol per week. He reports current drug use. Drug: Marijuana.  Family History:  History reviewed. No pertinent family history.  Blood pressure (!) 152/84, pulse 74, temperature (!) 97.4 F (36.3 C), resp. rate (!) 25, height 6' (1.829 m), weight 96 kg, SpO2 100 %. General appearance: alert, cooperative, and appears stated age Head: Normocephalic, without obvious abnormality, atraumatic Eyes: negative Neck: no adenopathy, no carotid bruit, supple, symmetrical, trachea midline, and thyroid not enlarged, symmetric, no tenderness/mass/nodules Back: symmetric, no curvature. ROM normal. No CVA tenderness. Resp: clear to auscultation bilaterally Cardio: regular rate and rhythm GI: soft, non-tender; bowel sounds normal; no masses,  no organomegaly Extremities: extremities normal, atraumatic, no cyanosis or edema Pulses: 2+ and symmetric Skin: Skin color, texture, turgor normal. No rashes or lesions Access: Lt BCF + thrill       Ethelene Hal, MD 06/29/2022, 4:46 AM

## 2022-06-29 NOTE — Discharge Instructions (Signed)
Return as needed

## 2022-06-30 ENCOUNTER — Ambulatory Visit (INDEPENDENT_AMBULATORY_CARE_PROVIDER_SITE_OTHER): Payer: 59 | Admitting: Podiatry

## 2022-06-30 VITALS — BP 136/80

## 2022-06-30 DIAGNOSIS — B351 Tinea unguium: Secondary | ICD-10-CM

## 2022-06-30 DIAGNOSIS — M79675 Pain in left toe(s): Secondary | ICD-10-CM | POA: Diagnosis not present

## 2022-06-30 DIAGNOSIS — Z992 Dependence on renal dialysis: Secondary | ICD-10-CM

## 2022-06-30 DIAGNOSIS — E0822 Diabetes mellitus due to underlying condition with diabetic chronic kidney disease: Secondary | ICD-10-CM

## 2022-06-30 DIAGNOSIS — L84 Corns and callosities: Secondary | ICD-10-CM

## 2022-06-30 DIAGNOSIS — N186 End stage renal disease: Secondary | ICD-10-CM

## 2022-06-30 DIAGNOSIS — M79674 Pain in right toe(s): Secondary | ICD-10-CM

## 2022-06-30 LAB — HEPATITIS B SURFACE ANTIBODY, QUANTITATIVE: Hep B S AB Quant (Post): 1101 m[IU]/mL (ref >9.9)

## 2022-07-04 ENCOUNTER — Encounter: Payer: Self-pay | Admitting: Podiatry

## 2022-07-04 NOTE — Progress Notes (Signed)
  Subjective:  Patient ID: Jacob Daniel, male    DOB: 24-Feb-1956,  MRN: [id number]  67 y.o. male presents for  Chief Complaint  Patient presents with   Nail Problem    R[facility] PCP-[facility] PCP VST-8 months ago   Patient states he is not diabetic.  New problem(s): None   PCP is Hyman Hopes, [employer], PA-C.  No Known Allergies  Review of Systems: Negative except as noted in the HPI.   Objective:  Jacob Daniel is a pleasant 67 y.o. male in NAD.  Vascular Examination: Vascular status intact b/l with palpable pedal pulses. CFT immediate b/l. No edema. No pain with calf compression b/l. Skin temperature gradient WNL b/l.   Neurological Examination: Sensation grossly intact b/l with 10 gram monofilament. Vibratory sensation intact b/l.   Dermatological Examination: Pedal skin with normal turgor, texture and tone b/l. Toenails 1-5 b/l thick, discolored, elongated with subungual debris and pain on dorsal palpation. No hyperkeratotic lesions noted b/l.   Musculoskeletal Examination: Muscle strength 5/5 to b/l LE. HAV with bunion deformity noted b/l LE.  Radiographs: None   Assessment:   1. Pain due to onychomycosis of toenails of both feet   2. 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 -Toenails 1-5 b/l were debrided in length and girth with sterile nail nippers and dremel without iatrogenic bleeding.  -Patient/POA to call should there be question/concern in the interim.  Return in about 3 months (around 09/29/2022).  Jacob Daniel, DPM

## 2022-11-25 ENCOUNTER — Ambulatory Visit (INDEPENDENT_AMBULATORY_CARE_PROVIDER_SITE_OTHER): Payer: 59 | Admitting: Podiatry

## 2022-11-25 ENCOUNTER — Encounter: Payer: Self-pay | Admitting: Podiatry

## 2022-11-25 DIAGNOSIS — M79675 Pain in left toe(s): Secondary | ICD-10-CM | POA: Diagnosis not present

## 2022-11-25 DIAGNOSIS — M79674 Pain in right toe(s): Secondary | ICD-10-CM

## 2022-11-25 DIAGNOSIS — B351 Tinea unguium: Secondary | ICD-10-CM

## 2022-11-25 DIAGNOSIS — Z992 Dependence on renal dialysis: Secondary | ICD-10-CM

## 2022-11-25 DIAGNOSIS — N186 End stage renal disease: Secondary | ICD-10-CM

## 2022-11-29 NOTE — Progress Notes (Signed)
Subjective:  Patient ID: Jacob Daniel, male    DOB: 03/26/1955,  MRN: [id number]  Jacob Daniel presents to clinic today for: at risk foot care. Patient has h/o ESRD on hemodialysis and painful elongated mycotic toenails 1-5 bilaterally which are tender when wearing enclosed shoe gear. Pain is relieved with periodic professional debridement.  Chief Complaint  Patient presents with   RFC    RFC    PCP is Hyman Hopes, [employer], PA-C.  No Known Allergies  Review of Systems: Negative except as noted in the HPI.  Objective: No changes noted in today's physical examination. There were no vitals filed for this visit.  Jacob Daniel is a pleasant 67 y.o. male in NAD. AAO x 3.  Vascular Examination: Capillary refill time <3 seconds b/l LE. Palpable pedal pulses b/l LE. Digital hair absent b/l. No pedal edema b/l. Skin temperature gradient WNL b/l. No varicosities b/l. Marland Kitchen  Dermatological Examination: Pedal skin with normal turgor, texture and tone b/l. No open wounds. No interdigital macerations b/l. Toenails 1-5 b/l thickened, discolored, dystrophic with subungual debris. There is pain on palpation to dorsal aspect of nailplates. No hyperkeratotic nor porokeratotic lesions present on today's visit.Marland Kitchen  Neurological Examination: Protective sensation intact with 10 gram monofilament b/l LE. Vibratory sensation intact b/l LE.   Musculoskeletal Examination: Normal muscle strength 5/5 to all lower extremity muscle groups bilaterally. HAV with bunion deformity noted b/l LE.Marland Kitchen No pain, crepitus or joint limitation noted with ROM b/l LE.  Patient ambulates independently without assistive aids.  Assessment/Plan: 1. Pain due to onychomycosis of toenails of both feet   2. ESRD on dialysis [facility])     -Patient was evaluated and treated. All patient's and/or POA's questions/concerns answered on today's visit. -Patient to continue soft, supportive shoe gear daily. -Mycotic toenails 1-5 bilaterally were  debrided in length and girth with sterile nail nippers and dremel without incident. -Patient/POA to call should there be question/concern in the interim.   Return in about 3 months (around 02/24/2023).  Freddie Breech, DPM

## 2023-01-27 ENCOUNTER — Emergency Department (HOSPITAL_COMMUNITY)
Admission: EM | Admit: 2023-01-27 | Discharge: 2023-01-27 | Disposition: A | Discharge status: Home / Self Care | Payer: 59 | Attending: Emergency Medicine | Admitting: Emergency Medicine

## 2023-01-27 ENCOUNTER — Other Ambulatory Visit: Payer: Self-pay

## 2023-01-27 ENCOUNTER — Encounter (HOSPITAL_COMMUNITY): Payer: Self-pay

## 2023-01-27 DIAGNOSIS — I5020 Unspecified systolic (congestive) heart failure: Secondary | ICD-10-CM | POA: Diagnosis not present

## 2023-01-27 DIAGNOSIS — T829XXA Unspecified complication of cardiac and vascular prosthetic device, implant and graft, initial encounter: Secondary | ICD-10-CM | POA: Insufficient documentation

## 2023-01-27 DIAGNOSIS — Z79899 Other long term (current) drug therapy: Secondary | ICD-10-CM | POA: Diagnosis not present

## 2023-01-27 DIAGNOSIS — N186 End stage renal disease: Secondary | ICD-10-CM | POA: Diagnosis not present

## 2023-01-27 DIAGNOSIS — I132 Hypertensive heart and chronic kidney disease with heart failure and with stage 5 chronic kidney disease, or end stage renal disease: Secondary | ICD-10-CM | POA: Insufficient documentation

## 2023-01-27 DIAGNOSIS — Z992 Dependence on renal dialysis: Secondary | ICD-10-CM | POA: Insufficient documentation

## 2023-01-27 DIAGNOSIS — E1122 Type 2 diabetes mellitus with diabetic chronic kidney disease: Secondary | ICD-10-CM | POA: Insufficient documentation

## 2023-01-27 DIAGNOSIS — T8249XA Other complication of vascular dialysis catheter, initial encounter: Secondary | ICD-10-CM | POA: Diagnosis present

## 2023-01-27 NOTE — ED Notes (Addendum)
Pt new bandage remains clean and free of bleeding

## 2023-01-27 NOTE — Discharge Instructions (Addendum)
It was a pleasure caring for you today in the emergency department.  Please keep initial bandage on the wound until this evening  Please return to the emergency department for any worsening or worrisome symptoms.

## 2023-01-27 NOTE — ED Notes (Signed)
New bandage placed on pt after seen by EDP

## 2023-01-27 NOTE — ED Provider Notes (Signed)
[place] EMERGENCY DEPARTMENT AT [facility] Provider Note  CSN: [id number] Arrival date & time: 01/27/23 1303  Chief Complaint(s) Vascular Access Problem  HPI Jacob Daniel is a 67 y.o. male with past medical history as below, significant for CHF, CKD, DM, ESRD on HD Monday Wednesday Friday who presents to the ED with complaint of bleeding dialysis access  He had full run of HD today, no missed sessions.  After finishing dialysis there was ongoing bleeding from his left-sided dialysis access.  They applied pressure for approximately 2 hours and the bleeding improved but still having some oozing.  He otherwise feels normal.  Normal state of health prior to onset of symptoms. No trauma.   Past Medical History Past Medical History:  Diagnosis Date   Arthritis    CHF (congestive heart failure) (HCC)    Chronic kidney disease    Diabetes mellitus without complication (HCC)    Hypertension    Patient Active Problem List   Diagnosis Date Noted   Pulmonary edema 01/17/2020   Erectile dysfunction 11/23/2019   Alcohol abuse 10/04/2019   Cannabis abuse 10/04/2019   Cardiomyopathy in diseases classified elsewhere (HCC) 10/04/2019   Chronic kidney disease, stage 3 unspecified (HCC) 10/04/2019   Dependence on renal dialysis (HCC) 10/04/2019   Hypomagnesemia 10/04/2019   Metabolic acidosis 10/04/2019   Obesity 10/04/2019   Obstructive sleep apnea syndrome 10/04/2019   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   Systolic heart failure (HCC) 10/04/2019   End stage renal disease (HCC) 10/04/2019   Balance problem 05/26/2018   Chronic pain of right knee 05/26/2018   Right lumbar radiculitis 04/28/2018   Anemia 10/20/2017   History of colon polyps 10/20/2017   NSAID long-term use 10/20/2017   Positive fecal occult blood test 10/20/2017   Acute gout 09/08/2017   Chronic bilateral low back pain with right-sided sciatica 09/08/2017   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 Authorizing Provider  amLODipine (NORVASC) 10 MG tablet Take 10 mg by mouth daily. 07/01/20 10/04/21  [provider]  atorvastatin (LIPITOR) 10 MG tablet Take 10 mg by mouth daily. 07/02/20 10/04/21  [provider]  calcium acetate (PHOSLO) 667 MG capsule Take 2,001 mg by mouth 3 (three) times daily with meals. 07/27/20   [provider]  carvedilol (COREG) 25 MG tablet Take 50 mg by mouth at bedtime. 11/13/20 11/13/21  [provider]  doxazosin (CARDURA) 4 MG tablet Take 4 mg by mouth at bedtime. 10/08/14   [provider]  ferrous sulfate 325 (65 FE) MG EC tablet Take 325 mg by mouth. Takes with dialysis ( Tuesday,Thursday and Saturday) 08/21/20   [provider]  indomethacin (INDOCIN) 25 MG capsule Take 25 mg by mouth 2 (two) times daily as needed (for gout flare (with a meal)).    [provider]  magnesium oxide (MAG-OX) 400 MG tablet Take 400 mg by mouth daily.  07/14/19   [provider]  sildenafil (REVATIO) 20 MG tablet Take 20-60 mg by mouth daily as needed (erectile dysfunction). 06/03/20   [provider]  sodium bicarbonate 650 MG tablet Take 650 mg by mouth 2 (two) times daily. 07/26/19   [provider]  tacrolimus (PROGRAF)  1 MG capsule Take 1 mg by mouth daily. 10/08/14   [provider]                                                                                                                                    Past Surgical History Past Surgical History:  Procedure Laterality Date   KIDNEY TRANSPLANT  2010   SPINE SURGERY     Family History History reviewed.  No pertinent family history.  Social History Social History   Tobacco Use   Smoking status: Never   Smokeless tobacco: Never  Substance Use Topics   Alcohol use: Not Currently    Alcohol/week: 1.0 standard drink of alcohol    Types: 1 Cans of beer per week   Drug use: Yes    Types: Marijuana    Comment: "as many times as i can get it "   Allergies Patient has no known allergies.  Review of Systems Review of Systems  Constitutional:  Negative for chills and fever.  Respiratory:  Negative for chest tightness and shortness of breath.   Cardiovascular:  Negative for chest pain.  Gastrointestinal:  Negative for anal bleeding.  Skin:  Positive for wound.  Neurological:  Negative for syncope.  All other systems reviewed and are negative.   Physical Exam Vital Signs  I have reviewed the triage vital signs BP (!) 174/101 (BP Location: Right Arm)   Pulse 62   Temp 97.6 F (36.4 C) (Oral)   Resp 15   Ht 6' (1.829 m)   Wt 88.9 kg   SpO2 100%   BMI 26.58 kg/m  Physical Exam Vitals and nursing note reviewed.  Constitutional:      General: He is not in acute distress.    Appearance: Normal appearance. He is well-developed. He is not ill-appearing.  HENT:     Head: Normocephalic and atraumatic.     Right Ear: External ear normal.     Left Ear: External ear normal.     Nose: Nose normal.     Mouth/Throat:     Mouth: Mucous membranes are moist.  Eyes:     General: No scleral icterus.       Right eye: No discharge.        Left eye: No discharge.  Cardiovascular:     Rate and Rhythm: Normal rate.  Pulmonary:     Effort: Pulmonary effort is normal. No respiratory distress.     Breath sounds: No stridor.  Abdominal:     General: Abdomen is flat. There is no distension.     Tenderness: There is no guarding.  Musculoskeletal:        General: No deformity.     Cervical back: No rigidity.  Skin:    General: Skin is warm and dry.     Coloration: Skin is not cyanotic,  jaundiced or pale.       Neurological:  Mental Status: He is alert and oriented to person, place, and time.     GCS: GCS eye subscore is 4. GCS verbal subscore is 5. GCS motor subscore is 6.  Psychiatric:        Speech: Speech normal.        Behavior: Behavior normal. Behavior is cooperative.     ED Results and Treatments Labs (all labs ordered are listed, but only abnormal results are displayed) Labs Reviewed - No data to display                                                                                                                        Radiology No results found.  Pertinent labs & imaging results that were available during my care of the patient were reviewed by me and considered in my medical decision making (see MDM for details).  Medications Ordered in ED Medications - No data to display                                                                                                                                   Procedures Procedures  (including critical care time)  Medical Decision Making / ED Course    Medical Decision Making:    Jacob Daniel is a 67 y.o. male with past medical history as below, significant for CHF, CKD, DM, ESRD on HD Monday Wednesday Friday who presents to the ED with complaint of bleeding dialysis access. The complaint involves an extensive differential diagnosis and also carries with it a high risk of complications and morbidity.  Serious etiology was considered. Ddx includes but is not limited to: bleeding graft, trauma, coagulopathy, etc  Complete initial physical exam performed, notably the patient  was nad, HDS.    Reviewed and confirmed nursing documentation for past medical history, family history, social history.  Vital signs reviewed.        He has a left upper extremity AV fistula, noted be oozing initially on exam after bandage was taken down.  Fistula began to have brisk bleeding after 1 minute and dressing was  reapplied.  He has post HD bleeding from fistula, this has resolved after direct pressure with dressing. Advised to keep dressing on next 12 hours, return if bleeding re-occurs and does not stop with direct pressure.   No bleeding on recheck after around 2 hours after dressing was applied.  Palpable thrill to  fistula at time of d/c. Bleeding has stopped, he feels he is at baseline, no weakness/dyspnea. Does not appear to have lost sig volume of blood fortunately. HDS.  The patient improved significantly and was discharged in stable condition. Detailed discussions were had with the patient regarding current findings, and need for close f/u with PCP or on call doctor. The patient has been instructed to return immediately if the symptoms worsen in any way for re-evaluation. Patient verbalized understanding and is in agreement with current care plan. All questions answered prior to discharge.               Additional history obtained: -Additional history obtained from friend -External records from outside source obtained and reviewed including: Chart review including previous notes, labs, imaging, consultation notes including  Prior labs    Lab Tests: na  EKG   EKG Interpretation Date/Time:    Ventricular Rate:    PR Interval:    QRS Duration:    QT Interval:    QTC Calculation:   R Axis:      Text Interpretation:           Imaging Studies ordered: na   Medicines ordered and prescription drug management: No orders of the defined types were placed in this encounter.   -I have reviewed the patients home medicines and have made adjustments as needed   Consultations Obtained: na   Cardiac Monitoring:  Continuous pulse oximetry interpreted by myself, 97% on RA.    Social Determinants of Health:  Diagnosis or treatment significantly limited by social determinants of health: esrd on HD, lives alone   Reevaluation: After the interventions noted above, I  reevaluated the patient and found that they have resolved  Co morbidities that complicate the patient evaluation  Past Medical History:  Diagnosis Date   Arthritis    CHF (congestive heart failure) (HCC)    Chronic kidney disease    Diabetes mellitus without complication (HCC)    Hypertension       Dispostion: Disposition decision including need for hospitalization was considered, and patient discharged from emergency department.    Final Clinical Impression(s) / ED Diagnoses Final diagnoses:  Complication of arteriovenous dialysis fistula, initial encounter        Sloan Leiter, DO 01/28/23 1914

## 2023-01-27 NOTE — ED Triage Notes (Addendum)
EMS: After dialysis site kept bleeding. Dialysis team ,tated every time they unclamped, the site continued to bleed. 160/90, HR in 60s, and 96% on RA. Heparin administered at the beginning of treatment. Pt ambulated to EMS bed without assistance.    Pt: after dialysis the bleeding would not stop. Pt stated the site was hurting after being stuck. Bleeding for over 2 hours.  Pt AxOx4, color WNL, denies N/V, dizziness, or headaches. Denies arm being in pain. Fistula is looked on left arm and wrapped in white bandage there are small amounts of old blood. No evidence of site currently bleeding.

## 2023-02-24 ENCOUNTER — Ambulatory Visit (INDEPENDENT_AMBULATORY_CARE_PROVIDER_SITE_OTHER): Payer: 59 | Admitting: Podiatry

## 2023-02-24 DIAGNOSIS — Z91199 Patient's noncompliance with other medical treatment and regimen due to unspecified reason: Secondary | ICD-10-CM

## 2023-02-24 NOTE — Progress Notes (Signed)
No show

## 2023-04-06 ENCOUNTER — Inpatient Hospital Stay (HOSPITAL_COMMUNITY)
Admission: EM | Admit: 2023-04-06 | Discharge: 2023-04-10 | DRG: 286 | Disposition: A | Discharge status: Home / Self Care | Payer: 59 | Attending: Internal Medicine | Admitting: Internal Medicine

## 2023-04-06 ENCOUNTER — Encounter (HOSPITAL_COMMUNITY): Payer: Self-pay

## 2023-04-06 ENCOUNTER — Emergency Department (HOSPITAL_COMMUNITY): Payer: 59

## 2023-04-06 DIAGNOSIS — Z91148 Patient's other noncompliance with medication regimen for other reason: Secondary | ICD-10-CM

## 2023-04-06 DIAGNOSIS — E7800 Pure hypercholesterolemia, unspecified: Secondary | ICD-10-CM

## 2023-04-06 DIAGNOSIS — D631 Anemia in chronic kidney disease: Secondary | ICD-10-CM | POA: Diagnosis present

## 2023-04-06 DIAGNOSIS — D649 Anemia, unspecified: Secondary | ICD-10-CM | POA: Diagnosis present

## 2023-04-06 DIAGNOSIS — I4891 Unspecified atrial fibrillation: Secondary | ICD-10-CM

## 2023-04-06 DIAGNOSIS — J9601 Acute respiratory failure with hypoxia: Secondary | ICD-10-CM | POA: Diagnosis present

## 2023-04-06 DIAGNOSIS — I480 Paroxysmal atrial fibrillation: Secondary | ICD-10-CM | POA: Diagnosis present

## 2023-04-06 DIAGNOSIS — Z79899 Other long term (current) drug therapy: Secondary | ICD-10-CM

## 2023-04-06 DIAGNOSIS — Z1152 Encounter for screening for COVID-19: Secondary | ICD-10-CM

## 2023-04-06 DIAGNOSIS — I10 Essential (primary) hypertension: Secondary | ICD-10-CM | POA: Diagnosis present

## 2023-04-06 DIAGNOSIS — I4892 Unspecified atrial flutter: Principal | ICD-10-CM | POA: Diagnosis present

## 2023-04-06 DIAGNOSIS — I2510 Atherosclerotic heart disease of native coronary artery without angina pectoris: Secondary | ICD-10-CM

## 2023-04-06 DIAGNOSIS — E118 Type 2 diabetes mellitus with unspecified complications: Secondary | ICD-10-CM | POA: Diagnosis present

## 2023-04-06 DIAGNOSIS — Z79621 Long term (current) use of calcineurin inhibitor: Secondary | ICD-10-CM

## 2023-04-06 DIAGNOSIS — I77810 Thoracic aortic ectasia: Secondary | ICD-10-CM | POA: Diagnosis present

## 2023-04-06 DIAGNOSIS — N2581 Secondary hyperparathyroidism of renal origin: Secondary | ICD-10-CM | POA: Diagnosis present

## 2023-04-06 DIAGNOSIS — I5023 Acute on chronic systolic (congestive) heart failure: Secondary | ICD-10-CM

## 2023-04-06 DIAGNOSIS — E876 Hypokalemia: Secondary | ICD-10-CM | POA: Diagnosis present

## 2023-04-06 DIAGNOSIS — J121 Respiratory syncytial virus pneumonia: Secondary | ICD-10-CM | POA: Insufficient documentation

## 2023-04-06 DIAGNOSIS — E1122 Type 2 diabetes mellitus with diabetic chronic kidney disease: Secondary | ICD-10-CM | POA: Diagnosis present

## 2023-04-06 DIAGNOSIS — R5381 Other malaise: Secondary | ICD-10-CM | POA: Diagnosis present

## 2023-04-06 DIAGNOSIS — I132 Hypertensive heart and chronic kidney disease with heart failure and with stage 5 chronic kidney disease, or end stage renal disease: Secondary | ICD-10-CM | POA: Diagnosis present

## 2023-04-06 DIAGNOSIS — R0602 Shortness of breath: Secondary | ICD-10-CM | POA: Diagnosis not present

## 2023-04-06 DIAGNOSIS — Z91199 Patient's noncompliance with other medical treatment and regimen due to unspecified reason: Secondary | ICD-10-CM

## 2023-04-06 DIAGNOSIS — I509 Heart failure, unspecified: Secondary | ICD-10-CM

## 2023-04-06 DIAGNOSIS — E1165 Type 2 diabetes mellitus with hyperglycemia: Secondary | ICD-10-CM | POA: Diagnosis present

## 2023-04-06 DIAGNOSIS — T8612 Kidney transplant failure: Secondary | ICD-10-CM | POA: Diagnosis present

## 2023-04-06 DIAGNOSIS — I428 Other cardiomyopathies: Secondary | ICD-10-CM | POA: Diagnosis present

## 2023-04-06 DIAGNOSIS — J810 Acute pulmonary edema: Secondary | ICD-10-CM | POA: Insufficient documentation

## 2023-04-06 DIAGNOSIS — I272 Pulmonary hypertension, unspecified: Secondary | ICD-10-CM

## 2023-04-06 DIAGNOSIS — N186 End stage renal disease: Secondary | ICD-10-CM | POA: Diagnosis present

## 2023-04-06 DIAGNOSIS — Z7982 Long term (current) use of aspirin: Secondary | ICD-10-CM

## 2023-04-06 DIAGNOSIS — Z992 Dependence on renal dialysis: Secondary | ICD-10-CM

## 2023-04-06 DIAGNOSIS — Z940 Kidney transplant status: Secondary | ICD-10-CM

## 2023-04-06 HISTORY — DX: End stage renal disease: Z99.2

## 2023-04-06 HISTORY — DX: End stage renal disease: N18.6

## 2023-04-06 LAB — CBC
HCT: 30.6 % — ABNORMAL LOW (ref 39.0–52.0)
Hemoglobin: 10.2 g/dL — ABNORMAL LOW (ref 13.0–17.0)
MCH: 29.3 pg (ref 26.0–34.0)
MCHC: 33.3 g/dL (ref 30.0–36.0)
MCV: 87.9 fL (ref 80.0–100.0)
Platelets: 168 10*3/uL (ref 150–400)
RBC: 3.48 MIL/uL — ABNORMAL LOW (ref 4.22–5.81)
RDW: 17.3 % — ABNORMAL HIGH (ref 11.5–15.5)
WBC: 7.4 10*3/uL (ref 4.0–10.5)
nRBC: 0.3 % — ABNORMAL HIGH (ref 0.0–0.2)

## 2023-04-06 NOTE — ED Triage Notes (Signed)
Pt is coming in for shortness of breath, he normally is short of breath but after his last dialysis he has had increased shortness of breath. Pt is showing afib on medic vitals going from 90hr-120hr. Pt is a M/W/F dialysis. Initial O2 was 94 on ra, on 2l O2 he came up to 100%.  Medic vitals   100% on 2l  90-120hr 136/98 24rr 28 etco2  20g rt wrist/hand

## 2023-04-06 NOTE — ED Provider Notes (Signed)
[facility]EM[facility]Y DEPT [facility] Emergency Department Provider Note MRN:  [id number]  Arrival date & time: 04/07/23     Chief Complaint   Shortness of Breath   History of Present Illness   Jacob Daniel is a 68 y.o. year-old male presents to the ED with chief complaint of SOB.  Has had increasing SOB over the past few days.  Reports that he just doesn't feel well.  Last dialysis was yesterday, which he completed.  He reports associated cough.  Denies fevers.  EMS reported afib with rates 90s-120s.  O2 sat with EMS was 94%.  He doesn't normally wear O2, but does use it while at dialysis.  On 2L [place] now.  Denies any abdominal pain.  Denies any lower extremities swelling.  History provided by patient.   Review of Systems  Pertinent positive and negative review of systems noted in HPI.    Physical Exam   Vitals:   04/07/23 0145 04/07/23 0210  BP: (!) 141/121   Pulse: (!) 144   Resp: (!) 26   Temp:  97.6 F (36.4 C)  SpO2: 100%     CONSTITUTIONAL:  chronically ill-appearing, NAD NEURO:  Alert and oriented x 3, CN 3-12 grossly intact EYES:  eyes equal and reactive ENT/NECK:  Supple, no stridor  CARDIO:  tachycardic, regular rhythm, appears well-perfused  PULM:  No respiratory distress, rhonchi present GI/GU:  non-distended, non tender MSK/SPINE:  No gross deformities, no edema, moves all extremities  SKIN:  no rash, atraumatic   *Additional and/or pertinent findings included in MDM below  Diagnostic and Interventional Summary    EKG Interpretation Date/Time:  Tuesday April 06 2023 23:39:29 EST Ventricular Rate:  104 PR Interval:    QRS Duration:  104 QT Interval:  396 QTC Calculation: 521 R Axis:   93  Text Interpretation: Atrial flutter with predominant 3:1 AV block Right axis deviation Borderline repolarization abnormality Prolonged QT interval Confirmed by Gilda Crease [phone]) on 04/07/2023 12:46:18 AM       Labs Reviewed  RESP PANEL BY  RT-PCR (RSV, FLU A&B, COVID)  RVPGX2 - Abnormal; Notable for the following components:      Result Value   Resp Syncytial Virus by PCR POSITIVE (*)    All other components within normal limits  BASIC METABOLIC PANEL - Abnormal; Notable for the following components:   Glucose, Bld 103 (*)    BUN 47 (*)    Creatinine, Ser 7.86 (*)    Calcium 8.3 (*)    GFR, Estimated 7 (*)    Anion gap 17 (*)    All other components within normal limits  CBC - Abnormal; Notable for the following components:   RBC 3.48 (*)    Hemoglobin 10.2 (*)    HCT 30.6 (*)    RDW 17.3 (*)    nRBC 0.3 (*)    All other components within normal limits  BRAIN NATRIURETIC PEPTIDE - Abnormal; Notable for the following components:   B Natriuretic Peptide >4,500.0 (*)    All other components within normal limits  TROPONIN I (HIGH SENSITIVITY) - Abnormal; Notable for the following components:   Troponin I (High Sensitivity) 122 (*)    All other components within normal limits  TROPONIN I (HIGH SENSITIVITY) - Abnormal; Notable for the following components:   Troponin I (High Sensitivity) 107 (*)    All other components within normal limits  HEPARIN LEVEL (UNFRACTIONATED)  HIV ANTIBODY (ROUTINE TESTING W REFLEX)  TSH  COMPREHENSIVE METABOLIC  PANEL  CBC  TROPONIN I (HIGH SENSITIVITY)    DG Chest 2 View  Final Result      Medications  heparin ADULT infusion 100 units/mL (25000 units/248mL) (1,300 Units/hr Intravenous New Bag/Given 04/07/23 0114)  atorvastatin (LIPITOR) tablet 10 mg (has no administration in time range)  carvedilol (COREG) tablet 50 mg (has no administration in time range)  doxazosin (CARDURA) tablet 4 mg (has no administration in time range)  calcium acetate (PHOSLO) capsule 2,001 mg (has no administration in time range)  magnesium oxide (MAG-OX) tablet 400 mg (has no administration in time range)  sodium bicarbonate tablet 650 mg (has no administration in time range)  ferrous sulfate EC tablet 325  mg (has no administration in time range)  tacrolimus (PROGRAF) capsule 1 mg (has no administration in time range)  acetaminophen (TYLENOL) tablet 650 mg (has no administration in time range)    Or  acetaminophen (TYLENOL) suppository 650 mg (has no administration in time range)  heparin bolus via infusion 3,000 Units (3,000 Units Intravenous Bolus from Bag 04/07/23 0114)     Procedures  /  Critical Care .Critical Care  Performed by: Roxy Horseman, PA-C Authorized by: Roxy Horseman, PA-C   Critical care provider statement:    Critical care time (minutes):  45   Critical care was necessary to treat or prevent imminent or life-threatening deterioration of the following conditions:  Circulatory failure   Critical care was time spent personally by me on the following activities:  Development of treatment plan with patient or surrogate, discussions with consultants, evaluation of patient's response to treatment, examination of patient, ordering and review of laboratory studies, ordering and review of radiographic studies, ordering and performing treatments and interventions, pulse oximetry, re-evaluation of patient's condition and review of old charts   ED Course and Medical Decision Making  I have reviewed the triage vital signs, the nursing notes, and pertinent available records from the EMR.  Social Determinants Affecting Complexity of Care: Patient has no clinically significant social determinants affecting this chief complaint..   ED Course: Clinical Course as of 04/07/23 0242  Wed Apr 07, 2023  0151 I was notified by nursing that patient's rate went up to the 140s.  Repeat EKG obtained.  Patient remains asymptomatic.  Will discuss with Dr. Blinda Leatherwood. [RB]  0220 Rate dropped back down to the low 100s.  Will continue to monitor. [RB]  0239 Resp panel by RT-PCR (RSV, Flu A&B, Covid) Anterior Nasal Swab(!) RSV resulted positive, this could be part of why patient is feeling ill [RB]   0240 Troponin I (High Sensitivity)(!!) Initial troponin is 122, repeat is 107, thought to be demand secondary to CHF exacerbation and a flutter [RB]  0240 CBC(!) No leukocytosis [RB]  0240 Brain natriuretic peptide(!) BNP is elevated at greater than 4500 [RB]  0240 DG Chest 2 View Chest x-ray consistent with pulmonary edema [RB]    Clinical Course User Index [RB] Roxy Horseman, PA-C    Medical Decision Making Patient here with shortness of breath and feeling badly over the past few days.  He had dialysis yesterday.  States that he completed it.  States that he still feels some shortness of breath and feels badly.  Will check labs and x-ray and reassess.  EKG noted to show atrial flutter.  I do not see any record of this in his chart.  Likely new onset.  He is currently rate controlled in the low 100s and not hypotensive.  Given that he is not  anticoagulated, but also has history of CHF and is on dialysis, will use caution with AV nodal blockers.  Will start heparin.  Will admit to medicine.    Amount and/or Complexity of Data Reviewed Labs: ordered. Radiology: ordered.  Risk Prescription drug management. Decision regarding hospitalization.         Consultants: I consulted with Hospitalist, Dr. Toniann Fail, who is appreciated for admitting.   Treatment and Plan: Patient's exam and diagnostic results are concerning for CHF exacerbation thought 2/2 afib/flutter.  Feel that patient will need admission to the hospital for further treatment and evaluation.  Patient seen by and discussed with attending physician, Dr. Blinda Leatherwood, who recommends heparin and admission.  Final Clinical Impressions(s) / ED Diagnoses     ICD-10-CM   1. Atrial flutter, unspecified type (HCC)  I48.92     2. Acute on chronic congestive heart failure, unspecified heart failure type [facility])  I50.9       ED Discharge Orders     None         Discharge Instructions Discussed with and Provided to  Patient:   Discharge Instructions   None      Roxy Horseman, PA-C 04/07/23 0242    Gilda Crease, MD 04/07/23 [phone]

## 2023-04-07 ENCOUNTER — Inpatient Hospital Stay (HOSPITAL_COMMUNITY): Payer: 59

## 2023-04-07 ENCOUNTER — Other Ambulatory Visit: Payer: Self-pay

## 2023-04-07 ENCOUNTER — Encounter (HOSPITAL_COMMUNITY): Payer: Self-pay | Admitting: Internal Medicine

## 2023-04-07 DIAGNOSIS — I77810 Thoracic aortic ectasia: Secondary | ICD-10-CM | POA: Diagnosis present

## 2023-04-07 DIAGNOSIS — I480 Paroxysmal atrial fibrillation: Secondary | ICD-10-CM | POA: Diagnosis present

## 2023-04-07 DIAGNOSIS — Z79899 Other long term (current) drug therapy: Secondary | ICD-10-CM | POA: Diagnosis not present

## 2023-04-07 DIAGNOSIS — J810 Acute pulmonary edema: Secondary | ICD-10-CM

## 2023-04-07 DIAGNOSIS — I4892 Unspecified atrial flutter: Principal | ICD-10-CM

## 2023-04-07 DIAGNOSIS — Z940 Kidney transplant status: Secondary | ICD-10-CM | POA: Diagnosis not present

## 2023-04-07 DIAGNOSIS — I4891 Unspecified atrial fibrillation: Secondary | ICD-10-CM

## 2023-04-07 DIAGNOSIS — N186 End stage renal disease: Secondary | ICD-10-CM | POA: Diagnosis present

## 2023-04-07 DIAGNOSIS — E7800 Pure hypercholesterolemia, unspecified: Secondary | ICD-10-CM

## 2023-04-07 DIAGNOSIS — I272 Pulmonary hypertension, unspecified: Secondary | ICD-10-CM

## 2023-04-07 DIAGNOSIS — E876 Hypokalemia: Secondary | ICD-10-CM | POA: Diagnosis present

## 2023-04-07 DIAGNOSIS — I2510 Atherosclerotic heart disease of native coronary artery without angina pectoris: Secondary | ICD-10-CM | POA: Diagnosis present

## 2023-04-07 DIAGNOSIS — N2581 Secondary hyperparathyroidism of renal origin: Secondary | ICD-10-CM | POA: Diagnosis present

## 2023-04-07 DIAGNOSIS — T8612 Kidney transplant failure: Secondary | ICD-10-CM | POA: Diagnosis present

## 2023-04-07 DIAGNOSIS — D631 Anemia in chronic kidney disease: Secondary | ICD-10-CM | POA: Diagnosis present

## 2023-04-07 DIAGNOSIS — Z1152 Encounter for screening for COVID-19: Secondary | ICD-10-CM | POA: Diagnosis not present

## 2023-04-07 DIAGNOSIS — J121 Respiratory syncytial virus pneumonia: Secondary | ICD-10-CM

## 2023-04-07 DIAGNOSIS — Z91199 Patient's noncompliance with other medical treatment and regimen due to unspecified reason: Secondary | ICD-10-CM | POA: Diagnosis not present

## 2023-04-07 DIAGNOSIS — I5023 Acute on chronic systolic (congestive) heart failure: Secondary | ICD-10-CM | POA: Diagnosis present

## 2023-04-07 DIAGNOSIS — Z992 Dependence on renal dialysis: Secondary | ICD-10-CM | POA: Diagnosis not present

## 2023-04-07 DIAGNOSIS — E1122 Type 2 diabetes mellitus with diabetic chronic kidney disease: Secondary | ICD-10-CM | POA: Diagnosis present

## 2023-04-07 DIAGNOSIS — I132 Hypertensive heart and chronic kidney disease with heart failure and with stage 5 chronic kidney disease, or end stage renal disease: Secondary | ICD-10-CM | POA: Diagnosis present

## 2023-04-07 DIAGNOSIS — E1165 Type 2 diabetes mellitus with hyperglycemia: Secondary | ICD-10-CM | POA: Diagnosis present

## 2023-04-07 DIAGNOSIS — I428 Other cardiomyopathies: Secondary | ICD-10-CM | POA: Diagnosis present

## 2023-04-07 DIAGNOSIS — Z79621 Long term (current) use of calcineurin inhibitor: Secondary | ICD-10-CM | POA: Diagnosis not present

## 2023-04-07 DIAGNOSIS — R0602 Shortness of breath: Secondary | ICD-10-CM | POA: Diagnosis present

## 2023-04-07 DIAGNOSIS — R9439 Abnormal result of other cardiovascular function study: Secondary | ICD-10-CM | POA: Diagnosis not present

## 2023-04-07 DIAGNOSIS — I420 Dilated cardiomyopathy: Secondary | ICD-10-CM | POA: Diagnosis not present

## 2023-04-07 DIAGNOSIS — J9601 Acute respiratory failure with hypoxia: Secondary | ICD-10-CM | POA: Diagnosis present

## 2023-04-07 DIAGNOSIS — I10 Essential (primary) hypertension: Secondary | ICD-10-CM

## 2023-04-07 HISTORY — DX: Pure hypercholesterolemia, unspecified: E78.00

## 2023-04-07 HISTORY — DX: Pulmonary hypertension, unspecified: I27.20

## 2023-04-07 HISTORY — DX: Respiratory syncytial virus pneumonia: J12.1

## 2023-04-07 LAB — HEPARIN LEVEL (UNFRACTIONATED)
Heparin Unfractionated: 0.2 [IU]/mL — ABNORMAL LOW (ref 0.30–0.70)
Heparin Unfractionated: 0.19 [IU]/mL — ABNORMAL LOW (ref 0.30–0.70)

## 2023-04-07 LAB — COMPREHENSIVE METABOLIC PANEL
ALT: 20 U/L (ref 0–44)
AST: 21 U/L (ref 15–41)
Albumin: 2.5 g/dL — ABNORMAL LOW (ref 3.5–5.0)
Alkaline Phosphatase: 63 U/L (ref 38–126)
Anion gap: 11 (ref 5–15)
BUN: 45 mg/dL — ABNORMAL HIGH (ref 8–23)
CO2: 25 mmol/L (ref 22–32)
Calcium: 7.8 mg/dL — ABNORMAL LOW (ref 8.9–10.3)
Chloride: 99 mmol/L (ref 98–111)
Creatinine, Ser: 7.79 mg/dL — ABNORMAL HIGH (ref 0.61–1.24)
GFR, Estimated: 7 mL/min — ABNORMAL LOW (ref >60)
Glucose, Bld: 88 mg/dL (ref 70–99)
Potassium: 3.2 mmol/L — ABNORMAL LOW (ref 3.5–5.1)
Sodium: 135 mmol/L (ref 135–145)
Total Bilirubin: 1.8 mg/dL — ABNORMAL HIGH (ref 0.0–1.2)
Total Protein: 7.2 g/dL (ref 6.5–8.1)

## 2023-04-07 LAB — BASIC METABOLIC PANEL
Anion gap: 17 — ABNORMAL HIGH (ref 5–15)
BUN: 47 mg/dL — ABNORMAL HIGH (ref 8–23)
CO2: 22 mmol/L (ref 22–32)
Calcium: 8.3 mg/dL — ABNORMAL LOW (ref 8.9–10.3)
Chloride: 98 mmol/L (ref 98–111)
Creatinine, Ser: 7.86 mg/dL — ABNORMAL HIGH (ref 0.61–1.24)
GFR, Estimated: 7 mL/min — ABNORMAL LOW (ref >60)
Glucose, Bld: 103 mg/dL — ABNORMAL HIGH (ref 70–99)
Potassium: 3.5 mmol/L (ref 3.5–5.1)
Sodium: 137 mmol/L (ref 135–145)

## 2023-04-07 LAB — CBC
HCT: 28.6 % — ABNORMAL LOW (ref 39.0–52.0)
Hemoglobin: 9.5 g/dL — ABNORMAL LOW (ref 13.0–17.0)
MCH: 29.1 pg (ref 26.0–34.0)
MCHC: 33.2 g/dL (ref 30.0–36.0)
MCV: 87.7 fL (ref 80.0–100.0)
Platelets: 160 10*3/uL (ref 150–400)
RBC: 3.26 MIL/uL — ABNORMAL LOW (ref 4.22–5.81)
RDW: 17.2 % — ABNORMAL HIGH (ref 11.5–15.5)
WBC: 7.2 10*3/uL (ref 4.0–10.5)
nRBC: 0.3 % — ABNORMAL HIGH (ref 0.0–0.2)

## 2023-04-07 LAB — ECHOCARDIOGRAM COMPLETE
Area-P 1/2: 5.97 cm2
Calc EF: 40.9 %
Height: 72 in
MV M vel: 4.65 m/s
MV Peak grad: 86.5 mm[Hg]
S' Lateral: 4.8 cm
Single Plane A2C EF: 51.4 %
Single Plane A4C EF: 30.8 %
Weight: 3136 [oz_av]

## 2023-04-07 LAB — TROPONIN I (HIGH SENSITIVITY)
Troponin I (High Sensitivity): 107 ng/L (ref <18)
Troponin I (High Sensitivity): 109 ng/L (ref <18)
Troponin I (High Sensitivity): 122 ng/L (ref <18)
Troponin I (High Sensitivity): 124 ng/L (ref <18)

## 2023-04-07 LAB — RESP PANEL BY RT-PCR (RSV, FLU A&B, COVID)  RVPGX2
Influenza A by PCR: NEGATIVE
Influenza B by PCR: NEGATIVE
Resp Syncytial Virus by PCR: POSITIVE — AB
SARS Coronavirus 2 by RT PCR: NEGATIVE

## 2023-04-07 LAB — HEPATITIS B SURFACE ANTIGEN: Hepatitis B Surface Ag: NONREACTIVE

## 2023-04-07 LAB — BRAIN NATRIURETIC PEPTIDE: B Natriuretic Peptide: >4500 pg/mL — ABNORMAL HIGH (ref 0.0–100.0)

## 2023-04-07 LAB — TSH: TSH: 2.379 u[IU]/mL (ref 0.350–4.500)

## 2023-04-07 LAB — HIV ANTIBODY (ROUTINE TESTING W REFLEX): HIV Screen 4th Generation wRfx: NONREACTIVE

## 2023-04-07 MED ORDER — NEPRO/CARBSTEADY PO LIQD: 237.0000 mL | ORAL | Status: DC | PRN

## 2023-04-07 MED ORDER — HEPARIN SODIUM (PORCINE) 1000 UNIT/ML DIALYSIS: 2000.0000 [IU] | INTRAMUSCULAR | Status: DC | PRN

## 2023-04-07 MED ORDER — DOXAZOSIN MESYLATE 4 MG PO TABS
4.0000 mg | ORAL_TABLET | Freq: Every day | ORAL | Status: DC
  Administered 2023-04-09: 4 mg via ORAL
  Filled 2023-04-07 (×4): qty 1

## 2023-04-07 MED ORDER — SUCROFERRIC OXYHYDROXIDE 500 MG PO CHEW
500.0000 mg | CHEWABLE_TABLET | Freq: Every morning | ORAL | Status: DC
  Administered 2023-04-07 – 2023-04-10 (×3): 500 mg via ORAL
  Filled 2023-04-07 (×4): qty 1

## 2023-04-07 MED ORDER — PERFLUTREN LIPID MICROSPHERE
1.0000 mL | INTRAVENOUS | Status: AC | PRN
  Administered 2023-04-07: 1 mL via INTRAVENOUS

## 2023-04-07 MED ORDER — LIDOCAINE-PRILOCAINE 2.5-2.5 % EX CREA: 1.0000 | TOPICAL_CREAM | CUTANEOUS | Status: DC | PRN

## 2023-04-07 MED ORDER — ALBUTEROL SULFATE (2.5 MG/3ML) 0.083% IN NEBU: 0.6300 mg | INHALATION_SOLUTION | Freq: Four times a day (QID) | RESPIRATORY_TRACT | Status: DC | PRN

## 2023-04-07 MED ORDER — ACETAMINOPHEN 650 MG RE SUPP: 650.0000 mg | Freq: Four times a day (QID) | RECTAL | Status: DC | PRN

## 2023-04-07 MED ORDER — HEPARIN SODIUM (PORCINE) 1000 UNIT/ML DIALYSIS: 1000.0000 [IU] | INTRAMUSCULAR | Status: DC | PRN

## 2023-04-07 MED ORDER — CALCIUM ACETATE (PHOS BINDER) 667 MG PO CAPS
2001.0000 mg | ORAL_CAPSULE | Freq: Three times a day (TID) | ORAL | Status: DC
  Administered 2023-04-07 – 2023-04-10 (×7): 2001 mg via ORAL
  Filled 2023-04-07 (×7): qty 3

## 2023-04-07 MED ORDER — POTASSIUM CHLORIDE CRYS ER 20 MEQ PO TBCR
40.0000 meq | EXTENDED_RELEASE_TABLET | Freq: Once | ORAL | Status: AC
  Administered 2023-04-07: 40 meq via ORAL
  Filled 2023-04-07: qty 2

## 2023-04-07 MED ORDER — CARVEDILOL 12.5 MG PO TABS: 50.0000 mg | ORAL_TABLET | Freq: Every day | ORAL | Status: DC

## 2023-04-07 MED ORDER — ASPIRIN 81 MG PO CHEW
81.0000 mg | CHEWABLE_TABLET | ORAL | Status: AC
  Administered 2023-04-08: 81 mg via ORAL
  Filled 2023-04-07: qty 1

## 2023-04-07 MED ORDER — SODIUM CHLORIDE 0.9 % IV SOLN: INTRAVENOUS | Status: DC

## 2023-04-07 MED ORDER — TACROLIMUS 1 MG PO CAPS
1.0000 mg | ORAL_CAPSULE | Freq: Every day | ORAL | Status: DC
  Administered 2023-04-07 – 2023-04-10 (×4): 1 mg via ORAL
  Filled 2023-04-07 (×4): qty 1

## 2023-04-07 MED ORDER — HEPARIN BOLUS VIA INFUSION
3000.0000 [IU] | Freq: Once | INTRAVENOUS | Status: AC
  Administered 2023-04-07: 3000 [IU] via INTRAVENOUS
  Filled 2023-04-07: qty 3000

## 2023-04-07 MED ORDER — LOSARTAN POTASSIUM 50 MG PO TABS
100.0000 mg | ORAL_TABLET | Freq: Every day | ORAL | Status: DC
  Administered 2023-04-07 – 2023-04-08 (×2): 100 mg via ORAL
  Filled 2023-04-07 (×2): qty 2

## 2023-04-07 MED ORDER — HEPARIN BOLUS VIA INFUSION
1000.0000 [IU] | Freq: Once | INTRAVENOUS | Status: AC
  Administered 2023-04-07: 1000 [IU] via INTRAVENOUS
  Filled 2023-04-07: qty 1000

## 2023-04-07 MED ORDER — ALTEPLASE 2 MG IJ SOLR: 2.0000 mg | Freq: Once | INTRAMUSCULAR | Status: DC | PRN

## 2023-04-07 MED ORDER — HEPARIN (PORCINE) 25000 UT/250ML-% IV SOLN
1750.0000 [IU]/h | INTRAVENOUS | Status: DC
  Administered 2023-04-07: 1500 [IU]/h via INTRAVENOUS
  Administered 2023-04-07: 1300 [IU]/h via INTRAVENOUS
  Administered 2023-04-08: 1750 [IU]/h via INTRAVENOUS
  Filled 2023-04-07 (×3): qty 250

## 2023-04-07 MED ORDER — ATORVASTATIN CALCIUM 10 MG PO TABS
10.0000 mg | ORAL_TABLET | Freq: Every day | ORAL | Status: DC
  Administered 2023-04-07 – 2023-04-10 (×4): 10 mg via ORAL
  Filled 2023-04-07 (×4): qty 1

## 2023-04-07 MED ORDER — ANTICOAGULANT SODIUM CITRATE 4% (200MG/5ML) IV SOLN: 5.0000 mL | Status: DC | PRN

## 2023-04-07 MED ORDER — METOPROLOL TARTRATE 25 MG PO TABS
25.0000 mg | ORAL_TABLET | Freq: Two times a day (BID) | ORAL | Status: DC
  Administered 2023-04-07 – 2023-04-08 (×3): 25 mg via ORAL
  Filled 2023-04-07 (×3): qty 1

## 2023-04-07 MED ORDER — HEPARIN SODIUM (PORCINE) 1000 UNIT/ML DIALYSIS: 2500.0000 [IU] | Freq: Once | INTRAMUSCULAR | Status: DC

## 2023-04-07 MED ORDER — ACETAMINOPHEN 325 MG PO TABS: 650.0000 mg | ORAL_TABLET | Freq: Four times a day (QID) | ORAL | Status: DC | PRN

## 2023-04-07 MED ORDER — FERROUS SULFATE 325 (65 FE) MG PO TABS
325.0000 mg | ORAL_TABLET | Freq: Every day | ORAL | Status: DC
  Administered 2023-04-07 – 2023-04-10 (×4): 325 mg via ORAL
  Filled 2023-04-07 (×4): qty 1

## 2023-04-07 MED ORDER — LIDOCAINE HCL (PF) 1 % IJ SOLN: 5.0000 mL | INTRAMUSCULAR | Status: DC | PRN

## 2023-04-07 MED ORDER — PENTAFLUOROPROP-TETRAFLUOROETH EX AERO: 1.0000 | INHALATION_SPRAY | CUTANEOUS | Status: DC | PRN

## 2023-04-07 MED ORDER — CHLORHEXIDINE GLUCONATE CLOTH 2 % EX PADS: 6.0000 | MEDICATED_PAD | Freq: Every day | CUTANEOUS | Status: DC

## 2023-04-07 MED ORDER — SODIUM BICARBONATE 650 MG PO TABS
650.0000 mg | ORAL_TABLET | Freq: Two times a day (BID) | ORAL | Status: DC
  Administered 2023-04-07 – 2023-04-10 (×5): 650 mg via ORAL
  Filled 2023-04-07 (×7): qty 1

## 2023-04-07 MED ORDER — MAGNESIUM OXIDE -MG SUPPLEMENT 400 (240 MG) MG PO TABS
400.0000 mg | ORAL_TABLET | Freq: Every day | ORAL | Status: DC
  Administered 2023-04-07 – 2023-04-10 (×4): 400 mg via ORAL
  Filled 2023-04-07 (×4): qty 1

## 2023-04-07 MED ORDER — ASPIRIN 81 MG PO TBEC
81.0000 mg | DELAYED_RELEASE_TABLET | Freq: Every day | ORAL | Status: DC
  Administered 2023-04-07 – 2023-04-10 (×3): 81 mg via ORAL
  Filled 2023-04-07 (×3): qty 1

## 2023-04-07 NOTE — Progress Notes (Signed)
Pt receives out-pt HD at [facility] on MWF 5:45 am chair time. Clinic to fax pt's HD info to nephrologist per his request. Will assist as needed.   Olivia Canter Renal Navigator [phone]

## 2023-04-07 NOTE — ED Notes (Signed)
Pt refusing to use BSC at this time. This RN explained to patient it may be safer to use a bedside commode and explained to patient that he is on a heparin drip. Pt still refuses and says he wants to walk to the bathroom.

## 2023-04-07 NOTE — ED Notes (Addendum)
Writer notified Roxy Horseman, [place] of patient's HR going up to 140. Patient assessed and found to be asymptomatic. Denies pain, dizziness, increased SOB. No new orders at this time. Molly Maduro, PA states he will talk to Blinda Leatherwood, MD to discuss options. Repeat EKG done.

## 2023-04-07 NOTE — Consult Note (Signed)
Cardiology Consultation   Patient ID: Jacob Daniel MRN: [id number]; DOB: [birth year]  Admit date: 04/06/2023 Date of Consult: 04/07/2023  PCP:  Judd Lien, PA-C   [facility] Providers Cardiologist:  Armanda Magic, MD   {  Patient Profile:   Jacob Daniel is a 68 y.o. male with a hx of HFrEF, ESRD on hemodialysis on Mon/Wed/Fri, failed renal transplant takes Prograf, hypertension, hyperlipidemia, chronic anemia  who is being seen 04/07/2023 for the evaluation of new onset atrial flutter at the request of Dr. Renford Dills.  History of Present Illness:   Mr. Pontarelli is a 68 year old male with a past medical history of ESRD on hemodialysis on Mon/Wed/Fri, failed renal transplant takes Prograf, hypertension, hyperlipidemia, chronic anemia. Echocardiogram was done in 10/2022 that showed LVEF 35-40%, moderate to severe TR, mild to moderate MR, dilated left atrium, trace PR. Patient had nuclear stress test that showed fixed inferior wall defect, mild inferolateral hypokinesia, LVEF 51% during stress. He had these tests done for pre-renal transplant evaluation. He was then referred to [facility] cardiology to be seen for his abnormal results and work up his dyspnea. Patient was last seen in office with [facility] 10/2022 where they reported he had fluid overload secondary to his CHF and ESRD, noted he was noncompliant with medications, did not follow closely with a nephrologist, was previous dismissed, due to his behavior, from [place] [facility] and has to get treatment in [place]. [facility] cardiology noted that patient was noncompliant with medications and often was lost to follow up. They had discussed a cardiac catheterization at the time, to workip cardiomyopathy, but patient chose to pursue an updated echocardiogram instead.   Patient presented to [facility] ED on 04/07/2023 via EMS with a chief complaint of shortness of breath.  Per chart review, patient states that he is typically  chronically short of breath however since his dialysis session he has had worsening in his shortness of breath. O2 sats 94% in ED on RA, 100% on 2L Patient denied any chest pain, but did have some wheezing.  Per EMS when they found the patient they believe he was in atrial fibrillation with RVR. He admits to having a mechanical fall on Monday and has been doing poorly since then.  While in the ED the patient was found to be in atrial flutter with a heart rate fluctuating between 110-120.  It was reported that heart rate improved to 90 bpm without any interventions.  Results in ED are relevant as follows: CBC showed hemoglobin of 10.2, higher than patient's baseline.  BMP relatively stable with creatinine of 7.86, eGFR of 7, troponins resulted as 122 > 107 > 109 > 124, EKG in the ED showed atrial fibrillation with a HR of 104, follow-up EKG showed atrial flutter 3:1 with a heart rate of 104, then atrial flutter 2:1 with HR of 132 , CXR showed possible pulmonary edema, respiratory panel was positive for RSV, normal TSH, BNP > 4,500.  Patient was admitted to medicine. Nephrology and cardiology were both consulted on this patient.  Past Medical History:  Diagnosis Date   Arthritis    CHF (congestive heart failure) (HCC)    Diabetes mellitus without complication (HCC)    ESRD on hemodialysis (HCC)    Hypertension    Past Surgical History:  Procedure Laterality Date   KIDNEY TRANSPLANT  2010   SPINE SURGERY      Home Medications:  Prior to Admission medications   Medication Sig Start Date  End Date Taking? Authorizing Provider  acetaminophen-codeine (TYLENOL #2) 300-15 MG tablet Take 1 tablet by mouth every 8 (eight) hours as needed for moderate pain (pain score 4-6). 02/25/23  Yes [provider]  albuterol (ACCUNEB) 0.63 MG/3ML nebulizer solution Take 1 ampule by nebulization every 6 (six) hours as needed for wheezing.   Yes [provider]  albuterol (VENTOLIN HFA) 108 (90  Base) MCG/ACT inhaler Inhale 2 puffs into the lungs every 6 (six) hours as needed for wheezing.   Yes [provider]  atorvastatin (LIPITOR) 10 MG tablet Take 10 mg by mouth daily. 07/02/20 04/06/24 Yes [provider]  ferrous sulfate 325 (65 FE) MG EC tablet Take 325 mg by mouth every Monday, Wednesday, and Friday. 08/21/20  Yes [provider]  indomethacin (INDOCIN) 25 MG capsule Take 25 mg by mouth 2 (two) times daily as needed (for gout flare (with a meal)).   Yes [provider]  ipratropium-albuterol (DUONEB) 0.5-2.5 (3) MG/3ML SOLN Take 3 mLs by nebulization every 6 (six) hours as needed (for shortness of breath and wheezing). 02/16/23  Yes [provider]  losartan (COZAAR) 100 MG tablet Take 100 mg by mouth daily. 02/26/23  Yes [provider]  magnesium oxide (MAG-OX) 400 MG tablet Take 400 mg by mouth daily.  07/14/19  Yes [provider]  metoprolol tartrate (LOPRESSOR) 25 MG tablet Take 25 mg by mouth 2 (two) times daily. 04/01/23  Yes [provider]  sildenafil (REVATIO) 20 MG tablet Take 20-60 mg by mouth daily as needed (erectile dysfunction). 06/03/20  Yes [provider]  tacrolimus (PROGRAF) 1 MG capsule Take 1 mg by mouth daily. 10/08/14  Yes [provider]  VELPHORO 500 MG chewable tablet Chew 500 mg by mouth in the morning. 12/15/22  Yes [provider]  calcium acetate (PHOSLO) 667 MG capsule Take 2,001 mg by mouth 3 (three) times daily with meals. Patient not taking: Reported on 04/07/2023 07/27/20   [provider]  carvedilol (COREG) 25 MG tablet Take 50 mg by mouth at bedtime. Patient not taking: Reported on 04/07/2023 11/13/20 11/13/21  [provider]  doxazosin (CARDURA) 4 MG tablet Take 4 mg by mouth at bedtime. Patient not taking: Reported on 04/07/2023 10/08/14   [provider]  sodium bicarbonate 650 MG tablet Take 650 mg by mouth 2 (two) times  daily. Patient not taking: Reported on 04/07/2023 07/26/19   [provider]   Inpatient Medications: Scheduled Meds:  atorvastatin  10 mg Oral Daily   calcium acetate  2,001 mg Oral TID WC   [START ON 04/08/2023] Chlorhexidine Gluconate Cloth  6 each Topical Q0600   doxazosin  4 mg Oral QHS   ferrous sulfate  325 mg Oral Q breakfast   losartan  100 mg Oral Daily   magnesium oxide  400 mg Oral Daily   metoprolol tartrate  25 mg Oral BID   potassium chloride  40 mEq Oral Once   sodium bicarbonate  650 mg Oral BID   sucroferric oxyhydroxide  500 mg Oral q AM   tacrolimus  1 mg Oral Daily   Continuous Infusions:  heparin 1,500 Units/hr (04/07/23 1205)   PRN Meds: acetaminophen **OR** acetaminophen, albuterol, perflutren lipid microspheres (DEFINITY) IV suspension  Allergies:   No Known Allergies  Social History:   Social History   Socioeconomic History   Marital status: Divorced    Spouse name: Not on file   Number of children: Not on file  Years of education: Not on file   Highest education level: Not on file  Occupational History   Not on file  Tobacco Use   Smoking status: Never   Smokeless tobacco: Never  Substance and Sexual Activity   Alcohol use: Not Currently    Alcohol/week: 1.0 standard drink of alcohol    Types: 1 Cans of beer per week   Drug use: Yes    Types: Marijuana    Comment: "as many times as i can get it "   Sexual activity: Not on file  Other Topics Concern   Not on file  Social History Narrative   Not on file   Social Drivers of Health   Financial Resource Strain: Low Risk  (11/05/2022)   Received from [facility]   Overall Financial Resource Strain (CARDIA)    Difficulty of Paying Living Expenses: Not hard at all  Food Insecurity: No Food Insecurity (11/05/2022)   [facility]   Hunger Vital Sign    Worried About Running Out of Food in the Last Year: Never true    Ran Out of Food in the Last Year: Never true   Transportation Needs: No Transportation Needs (11/05/2022)   Received from [facility] - Transportation    Lack of Transportation (Medical): No    Lack of Transportation (Non-Medical): No  Physical Activity: Not on file  Stress: Not on file  Social Connections: Unknown (07/29/2021)   Received from [facility], [facility]   Social Network    Social Network: Not on file  Intimate Partner Violence: Unknown (06/20/2021)   Received from [facility], [facility]   HITS    Physically Hurt: Not on file    Insult or Talk Down To: Not on file    Threaten Physical Harm: Not on file    Scream or Curse: Not on file    Family History:   History reviewed. No pertinent family history.   ROS:  Please see the history of present illness.  All other ROS reviewed and negative.     Physical Exam/Data:   Vitals:   04/07/23 0600 04/07/23 0700 04/07/23 0908 04/07/23 1030  BP: (!) 143/107 (!) 140/108 (!) 131/94 (!) 132/107  Pulse: 97 89 91 90  Resp: 17 12 12  (!) 26  Temp: 98.1 F (36.7 C)  97.8 F (36.6 C)   TempSrc: Oral     SpO2: 100% 100% 99% 100%  Weight:      Height:        Intake/Output Summary (Last 24 hours) at 04/07/2023 1255 Last data filed at 04/07/2023 9562 Gross per 24 hour  Intake 135.29 ml  Output --  Net 135.29 ml      04/07/2023   12:37 AM 01/27/2023    1:29 PM 06/29/2022   12:25 PM  Last 3 Weights  Weight (lbs) 196 lb 196 lb 195 lb 8.8 oz  Weight (kg) 88.905 kg 88.905 kg 88.7 kg     Body mass index is 26.58 kg/m.  General:  In no acute distress, on RA HEENT: normal Neck: + JVD Vascular: Distal pulses 2+ bilaterally Cardiac:  normal S1, S2; RRR; no murmur  Lungs:  bibasilar crackles with wheezing   Abd: soft, nontender Ext: 2+ bilateral LE edema Musculoskeletal:  No deformities Skin: warm and dry  Neuro:  No focal abnormalities noted Psych:  Normal affect   EKG:  The EKG was personally reviewed and demonstrates:  atrial flutter 2:1 with  HR 132 bpm Telemetry:  Telemetry was personally reviewed and demonstrates:  atrial fibrillation HR 80-90s  Relevant CV Studies: Echocardiogram (04/07/2023) IMPRESSIONS   1. Left ventricular ejection fraction, by estimation, is 30 to 35%. The  left ventricle has moderately decreased function. The left ventricle  demonstrates global hypokinesis. There is mild left ventricular  hypertrophy. Left ventricular diastolic  parameters are indeterminate.   2. Right ventricular systolic function is moderately reduced. The right  ventricular size is mildly enlarged. There is mildly elevated pulmonary  artery systolic pressure. The estimated right ventricular systolic  pressure is 44.5 mmHg.   3. Left atrial size was mildly dilated.   4. The mitral valve is normal in structure. Mild mitral valve  regurgitation. No evidence of mitral stenosis.   5. The aortic valve is tricuspid. Aortic valve regurgitation is trivial.  No aortic stenosis is present.   6. Aortic dilatation noted. There is dilatation of the ascending aorta,  measuring 40 mm.   7. The inferior vena cava is dilated in size with >50% respiratory  variability, suggesting right atrial pressure of 8 mmHg.   FINDINGS   Left Ventricle: Left ventricular ejection fraction, by estimation, is 30  to 35%. The left ventricle has moderately decreased function. The left  ventricle demonstrates global hypokinesis. Definity contrast agent was  given IV to delineate the left  ventricular endocardial borders. The left ventricular internal cavity size  was normal in size. There is mild left ventricular hypertrophy. Left  ventricular diastolic parameters are indeterminate.   Right Ventricle: The right ventricular size is mildly enlarged. No  increase in right ventricular wall thickness. Right ventricular systolic  function is moderately reduced. There is mildly elevated pulmonary artery  systolic pressure. The tricuspid  regurgitant velocity is 3.02  m/s, and with an assumed right atrial  pressure of 8 mmHg, the estimated right ventricular systolic pressure is  44.5 mmHg.   Left Atrium: Left atrial size was mildly dilated.   Right Atrium: Right atrial size was normal in size.   Pericardium: There is no evidence of pericardial effusion.   Mitral Valve: The mitral valve is normal in structure. Mild mitral valve  regurgitation. No evidence of mitral valve stenosis.   Tricuspid Valve: The tricuspid valve is normal in structure. Tricuspid  valve regurgitation is mild.   Aortic Valve: The aortic valve is tricuspid. Aortic valve regurgitation is  trivial. No aortic stenosis is present.   Pulmonic Valve: The pulmonic valve was not well visualized. Pulmonic valve  regurgitation is trivial.   Aorta: The aortic root is normal in size and structure and aortic  dilatation noted. There is dilatation of the ascending aorta, measuring 40  mm.   Venous: The inferior vena cava is dilated in size with greater than 50%  respiratory variability, suggesting right atrial pressure of 8 mmHg.   IAS/Shunts: The interatrial septum was not well visualized.   Laboratory Data:  High Sensitivity Troponin:   Recent Labs  Lab 04/06/23 2113 04/07/23 0053 04/07/23 0304 04/07/23 0437  TROPONINIHS 122* 107* 109* 124*     Chemistry Recent Labs  Lab 04/06/23 2113 04/07/23 0437  NA 137 135  K 3.5 3.2*  CL 98 99  CO2 22 25  GLUCOSE 103* 88  BUN 47* 45*  CREATININE 7.86* 7.79*  CALCIUM 8.3* 7.8*  GFRNONAA 7* 7*  ANIONGAP 17* 11    Recent Labs  Lab 04/07/23 0437  PROT 7.2  ALBUMIN 2.5*  AST 21  ALT 20  ALKPHOS 63  BILITOT 1.8*   Lipids No results for input(s): "CHOL", "TRIG", "HDL", "LABVLDL", "LDLCALC", "CHOLHDL" in the last 168 hours.  Hematology Recent Labs  Lab 04/06/23 2113 04/07/23 0437  WBC 7.4 7.2  RBC 3.48* 3.26*  HGB 10.2* 9.5*  HCT 30.6* 28.6*  MCV 87.9 87.7  MCH 29.3 29.1  MCHC 33.3 33.2  RDW 17.3* 17.2*  PLT  168 160   Thyroid  Recent Labs  Lab 04/07/23 0304  TSH 2.379    BNP Recent Labs  Lab 04/06/23 2113  BNP >4,500.0*    DDimer No results for input(s): "DDIMER" in the last 168 hours.  Radiology/Studies:  ECHOCARDIOGRAM COMPLETE Result Date: 04/07/2023    ECHOCARDIOGRAM REPORT   Patient Name:   Coron Hal Neer Date of Exam: 04/07/2023 Medical Rec #:  [id number]        Height:       72.0 in Accession #:    [id number]       Weight:       196.0 lb Date of Birth:  03/21/1955        BSA:          2.112 m Patient Age:    19 years         BP:           134/106 mmHg Patient Gender: M                HR:           71 bpm. Exam Location:  Inpatient Procedure: 2D Echo, Cardiac Doppler, Color Doppler and Intracardiac            Opacification Agent Indications:    I48.91* Unspeicified atrial fibrillation  History:        Patient has no prior history of Echocardiogram examinations. CHF                 and Cardiomyopathy, Arrythmias:Atrial Flutter; Risk                 Factors:Hypertension and Diabetes.  Sonographer:    Webb Laws Referring Phys: [id number] ARSHAD N KAKRAKANDY IMPRESSIONS  1. Left ventricular ejection fraction, by estimation, is 30 to 35%. The left ventricle has moderately decreased function. The left ventricle demonstrates global hypokinesis. There is mild left ventricular hypertrophy. Left ventricular diastolic parameters are indeterminate.  2. Right ventricular systolic function is moderately reduced. The right ventricular size is mildly enlarged. There is mildly elevated pulmonary artery systolic pressure. The estimated right ventricular systolic pressure is 44.5 mmHg.  3. Left atrial size was mildly dilated.  4. The mitral valve is normal in structure. Mild mitral valve regurgitation. No evidence of mitral stenosis.  5. The aortic valve is tricuspid. Aortic valve regurgitation is trivial. No aortic stenosis is present.  6. Aortic dilatation noted. There is dilatation of the ascending aorta,  measuring 40 mm.  7. The inferior vena cava is dilated in size with >50% respiratory variability, suggesting right atrial pressure of 8 mmHg. FINDINGS  Left Ventricle: Left ventricular ejection fraction, by estimation, is 30 to 35%. The left ventricle has moderately decreased function. The left ventricle demonstrates global hypokinesis. Definity contrast agent was given IV to delineate the left ventricular endocardial borders. The left ventricular internal cavity size was normal in size. There is mild left ventricular hypertrophy. Left ventricular diastolic parameters are indeterminate. Right Ventricle: The right ventricular size is mildly enlarged. No increase in right ventricular wall thickness. Right ventricular systolic function is  moderately reduced. There is mildly elevated pulmonary artery systolic pressure. The tricuspid regurgitant velocity is 3.02 m/s, and with an assumed right atrial pressure of 8 mmHg, the estimated right ventricular systolic pressure is 44.5 mmHg. Left Atrium: Left atrial size was mildly dilated. Right Atrium: Right atrial size was normal in size. Pericardium: There is no evidence of pericardial effusion. Mitral Valve: The mitral valve is normal in structure. Mild mitral valve regurgitation. No evidence of mitral valve stenosis. Tricuspid Valve: The tricuspid valve is normal in structure. Tricuspid valve regurgitation is mild. Aortic Valve: The aortic valve is tricuspid. Aortic valve regurgitation is trivial. No aortic stenosis is present. Pulmonic Valve: The pulmonic valve was not well visualized. Pulmonic valve regurgitation is trivial. Aorta: The aortic root is normal in size and structure and aortic dilatation noted. There is dilatation of the ascending aorta, measuring 40 mm. Venous: The inferior vena cava is dilated in size with greater than 50% respiratory variability, suggesting right atrial pressure of 8 mmHg. IAS/Shunts: The interatrial septum was not well visualized.  LEFT  VENTRICLE PLAX 2D LVIDd:         5.70 cm      Diastology LVIDs:         4.80 cm      LV e' medial:    5.91 cm/s LV PW:         1.00 cm      LV E/e' medial:  19.8 LV IVS:        1.10 cm      LV e' lateral:   12.10 cm/s LVOT diam:     2.00 cm      LV E/e' lateral: 9.7 LV SV:         39 LV SV Index:   19 LVOT Area:     3.14 cm  LV Volumes (MOD) LV vol d, MOD A2C: 132.0 ml LV vol d, MOD A4C: 104.0 ml LV vol s, MOD A2C: 64.1 ml LV vol s, MOD A4C: 72.0 ml LV SV MOD A2C:     67.9 ml LV SV MOD A4C:     104.0 ml LV SV MOD BP:      48.3 ml RIGHT VENTRICLE            IVC RV Basal diam:  4.10 cm    IVC diam: 2.90 cm RV S prime:     8.70 cm/s TAPSE (M-mode): 1.9 cm LEFT ATRIUM             Index        RIGHT ATRIUM           Index LA diam:        4.50 cm 2.13 cm/m   RA Area:     19.45 cm LA Vol (A2C):   59.8 ml 28.31 ml/m  RA Volume:   50.80 ml  24.05 ml/m LA Vol (A4C):   94.8 ml 44.88 ml/m LA Biplane Vol: 77.4 ml 36.64 ml/m  AORTIC VALVE LVOT Vmax:   92.12 cm/s LVOT Vmean:  55.320 cm/s LVOT VTI:    0.125 m  AORTA Ao Root diam: 3.60 cm Ao Asc diam:  4.00 cm MITRAL VALVE                TRICUSPID VALVE MV Area (PHT): 5.97 cm     TR Peak grad:   36.5 mmHg MV Decel Time: 127 msec     TR Vmax:        302.00 cm/s MR Peak  grad: 86.5 mmHg MR Mean grad: 59.0 mmHg     SHUNTS MR Vmax:      465.00 cm/s   Systemic VTI:  0.13 m MR Vmean:     361.0 cm/s    Systemic Diam: 2.00 cm MV E velocity: 117.00 cm/s Epifanio Lesches MD Electronically signed by Epifanio Lesches MD Signature Date/Time: 04/07/2023/11:50:24 AM    Final    DG Chest 2 View Result Date: 04/06/2023 CLINICAL DATA:  Dyspnea EXAM: CHEST - 2 VIEW COMPARISON:  07/14/2022 FINDINGS: The lungs are symmetrically well expanded. There is a perihilar interstitial thickening again seen in keeping with changes of mild interstitial pulmonary edema or airway inflammation. No confluent pulmonary infiltrate. No pneumothorax or pleural effusion. Cardiac size within normal limits.  Pulmonary vascularity is normal. IMPRESSION: 1. Perihilar interstitial thickening in keeping with changes of mild interstitial pulmonary edema or airway inflammation. Electronically Signed   By: Helyn Numbers M.D.   On: 04/06/2023 23:46   Assessment and Plan:   Atrial fibrillation/flutter with RVR, possibly new  Echocardiogram from 10/2022 showed: LVEF 35-40%, moderate to severe TR, mild to moderate MR, dilated left atrium, trace PR EKG from 10/2022 results say sinus rhythm but unable to see EKG No known history of atrial fib/flutter, patient reports that he was told he had this in 2018 but was seen by [facility] cardiology 10/2022 without any mention of current or past atrial fibrillation or flutter  EKGs from EMS/ED showed atrial fibrillation that then developed to atrial flutter  Patient currently in atrial fibrillation with rates 80-90s  HR was up to 140s but hanging in 110-120s, reported he got down to 90s without intervention Most recent HR 89 Normal TSH -- Continue heparin drip  -- Continue Lopressor 25 mg BID  HFrEF Elevated troponins Suspected CAD, abnormal nuclear stress test  Patient denies any chest pain prior or current Echocardiogram from 10/2022 showed: LVEF 35-40% Echocardiogram from today showed: LVEF 30-35% Nuclear stress test done 07/2022 showed: fixed inferior wall defect, mild inferolateral hypokinesia, LVEF 51% during stress  Work up was all completed for pre-renal transplant evaluation and then was seen by [facility] cardiology 10/2022 to further work up his dyspnea States he was supposed to follow up with a cardiologist some time in March  BNP > 4,500 Troponins 122>107>109>124 Likely from fluid overload and demand ischemia  -- Patient being followed closely by nephrology, due for scheduled dialysis today will likely help with volume status  -- Continue heparin drip  -- Schedule patient for RHC/LHC tomorrow, if patient agreeable  -- NPO at midnight  Hypertension  Most  recent BP 131/98 -- Continue Cardura 4 mg at bedtime  -- Continue Lopressor 25 mg BID -- Continue losartan 100 mg daily   Hyperlipidemia  Ordered lipid panel -- Continue home Lipitor 10 mg daily  Per primary Acute hypoxic respiratory failure RSV infection ESRD on hemodialysis  Normocytic anemia 2/2 ESRD Secondary hyperparathyroidism History of failed transplant   Risk Assessment/Risk Scores:       New York Heart Association (NYHA) Functional Class NYHA Class II  CHA2DS2-VASc Score = 3   This indicates a 3.2% annual risk of stroke. The patient's score is based upon: CHF History: 1 HTN History: 1 Diabetes History: 0 Stroke History: 0 Vascular Disease History: 0 Age Score: 1 Gender Score: 0        For questions or updates, please contact [facility] Please consult [email or web address] for contact info under    Signed, Olena Leatherwood, PA-C  04/07/2023 12:55 PM

## 2023-04-07 NOTE — Progress Notes (Signed)
PHARMACY - ANTICOAGULATION CONSULT NOTE  Pharmacy Consult for heparin Indication: atrial fibrillation  No Known Allergies  Patient Measurements: Height: 6' (182.9 cm) Weight: 92.9 kg (204 lb 11.2 oz) IBW/kg (Calculated) : 77.6  Vital Signs: Temp: 97.6 F (36.4 C) (01/22 2018) Temp Source: Oral (01/22 2018) BP: 140/100 (01/22 2018) Pulse Rate: 82 (01/22 1854)  Labs: Recent Labs    04/06/23 2113 04/07/23 0053 04/07/23 0304 04/07/23 0437 04/07/23 1023 04/07/23 2053  HGB 10.2*  --   --  9.5*  --   --   HCT 30.6*  --   --  28.6*  --   --   PLT 168  --   --  160  --   --   HEPARINUNFRC  --   --   --   --  0.20* 0.19*  CREATININE 7.86*  --   --  7.79*  --   --   TROPONINIHS 122* 107* 109* 124*  --   --     Estimated Creatinine Clearance: 10.1 mL/min (A) (by C-G formula based on SCr of 7.79 mg/dL (H)).   Medical History: Past Medical History:  Diagnosis Date   Arthritis    CHF (congestive heart failure) (HCC)    Diabetes mellitus without complication (HCC)    ESRD on hemodialysis (HCC)    Hypertension     Assessment: 68yo male c/o SOB worse than baseline, found to be in Afib >> to begin heparin.  -HL 0.10 - subtherapeutic despite bolus / rate increase to 1500 units/hr -No issues per RN.  Infusion was paused for ~64min for lab drawn due to restricted access.   Goal of Therapy:  Heparin level 0.3-0.7 units/ml Monitor platelets by anticoagulation protocol: Yes   Plan:  Increase heparin infusion to 1750 units/hr 8hr HL  Daily HL, CBC  Trixie Rude, PharmD Clinical Pharmacist 04/07/2023  9:39 PM

## 2023-04-07 NOTE — H&P (Addendum)
History and Physical    [name] EAV:[id number] DOB: [birth year] DOA: 04/06/2023  Patient coming from: Home.  Chief Complaint: Shortness of breath.  HPI: Jacob Daniel is a 68 y.o. male with history of ESRD on hemodialysis on Monday Wednesday Friday, failed renal transplant takes Prograf, hypertension, hyperlipidemia, chronic anemia presents to the ER after patient has been feeling increasingly short of breath.  Patient states he is chronically short of breath but since last dialysis yesterday his shortness of breath worsened.  Denies any chest pain has been having some wheezing.  EMS on arrival found that patient was in A-fib with RVR.  ED Course: In the ER patient was found to be in atrial flutter rate was fluctuating between 110 120 but improved to 90 without any interventions.  Chest x-ray shows possible pulmonary edema.  BNP was more than 4500 troponins were 1 20-107.  Hemoglobin 10.2.  Patient was started on heparin infusion for the atrial flutter and admitted for new onset atrial flutter with possible CHF.  Review of Systems: As per HPI, rest all negative.   Past Medical History:  Diagnosis Date   Arthritis    CHF (congestive heart failure) (HCC)    Chronic kidney disease    Diabetes mellitus without complication (HCC)    Hypertension     Past Surgical History:  Procedure Laterality Date   KIDNEY TRANSPLANT  2010   SPINE SURGERY       reports that he has never smoked. He has never used smokeless tobacco. He reports that he does not currently use alcohol after a past usage of about 1.0 standard drink of alcohol per week. He reports current drug use. Drug: Marijuana.  No Known Allergies  History reviewed. No pertinent family history.  Prior to Admission medications   Medication Sig Start Date End Date Taking? Authorizing Provider  amLODipine (NORVASC) 10 MG tablet Take 10 mg by mouth daily. 07/01/20 10/04/21  [provider]  atorvastatin (LIPITOR) 10 MG  tablet Take 10 mg by mouth daily. 07/02/20 10/04/21  [provider]  calcium acetate (PHOSLO) 667 MG capsule Take 2,001 mg by mouth 3 (three) times daily with meals. 07/27/20   [provider]  carvedilol (COREG) 25 MG tablet Take 50 mg by mouth at bedtime. 11/13/20 11/13/21  [provider]  doxazosin (CARDURA) 4 MG tablet Take 4 mg by mouth at bedtime. 10/08/14   [provider]  ferrous sulfate 325 (65 FE) MG EC tablet Take 325 mg by mouth. Takes with dialysis ( Tuesday,Thursday and Saturday) 08/21/20   [provider]  indomethacin (INDOCIN) 25 MG capsule Take 25 mg by mouth 2 (two) times daily as needed (for gout flare (with a meal)).    [provider]  magnesium oxide (MAG-OX) 400 MG tablet Take 400 mg by mouth daily.  07/14/19   [provider]  sildenafil (REVATIO) 20 MG tablet Take 20-60 mg by mouth daily as needed (erectile dysfunction). 06/03/20   [provider]  sodium bicarbonate 650 MG tablet Take 650 mg by mouth 2 (two) times daily. 07/26/19   [provider]  tacrolimus (PROGRAF) 1 MG capsule Take 1 mg by mouth daily. 10/08/14   [provider]    Physical Exam: Constitutional: Moderately built and nourished. Vitals:   04/07/23 0045 04/07/23 0100 04/07/23 0145 04/07/23 0210  BP: (!) 132/97 (!) 140/99 (!) 141/121   Pulse: 98 97 (!) 144   Resp: 20 (!) 22 (!) 26  Temp:    97.6 F (36.4 C)  TempSrc:    Oral  SpO2: 100% 100% 100%   Weight:      Height:       Eyes: Anicteric no pallor. ENMT: No discharge from the ears eyes nose or mouth. Neck: No mass felt.  No neck rigidity. Respiratory: No rhonchi or crepitations. Cardiovascular: S1-S2 heard. Abdomen: Soft nontender bowel sounds present. Musculoskeletal: No edema. Skin: No rash. Neurologic: Alert awake oriented to time place and person.  Moves all extremities. Psychiatric: Appears normal.  Normal affect.   Labs on Admission: I have  personally reviewed following labs and imaging studies  CBC: Recent Labs  Lab 04/06/23 2113  WBC 7.4  HGB 10.2*  HCT 30.6*  MCV 87.9  PLT 168   Basic Metabolic Panel: Recent Labs  Lab 04/06/23 2113  NA 137  K 3.5  CL 98  CO2 22  GLUCOSE 103*  BUN 47*  CREATININE 7.86*  CALCIUM 8.3*   GFR: Estimated Creatinine Clearance: 10 mL/min (A) (by C-G formula based on SCr of 7.86 mg/dL (H)). Liver Function Tests: No results for input(s): "AST", "ALT", "ALKPHOS", "BILITOT", "PROT", "ALBUMIN" in the last 168 hours. No results for input(s): "LIPASE", "AMYLASE" in the last 168 hours. No results for input(s): "AMMONIA" in the last 168 hours. Coagulation Profile: No results for input(s): "INR", "PROTIME" in the last 168 hours. Cardiac Enzymes: No results for input(s): "CKTOTAL", "CKMB", "CKMBINDEX", "TROPONINI" in the last 168 hours. BNP (last 3 results) No results for input(s): "PROBNP" in the last 8760 hours. HbA1C: No results for input(s): "HGBA1C" in the last 72 hours. CBG: No results for input(s): "GLUCAP" in the last 168 hours. Lipid Profile: No results for input(s): "CHOL", "HDL", "LDLCALC", "TRIG", "CHOLHDL", "LDLDIRECT" in the last 72 hours. Thyroid Function Tests: No results for input(s): "TSH", "T4TOTAL", "FREET4", "T3FREE", "THYROIDAB" in the last 72 hours. Anemia Panel: No results for input(s): "VITAMINB12", "FOLATE", "FERRITIN", "TIBC", "IRON", "RETICCTPCT" in the last 72 hours. Urine analysis: No results found for: "COLORURINE", "APPEARANCEUR", "LABSPEC", "PHURINE", "GLUCOSEU", "HGBUR", "BILIRUBINUR", "KETONESUR", "PROTEINUR", "UROBILINOGEN", "NITRITE", "LEUKOCYTESUR" Sepsis Labs: @LABRCNTIP (procalcitonin:4,lacticidven:4) ) Recent Results (from the past 240 hours)  Resp panel by RT-PCR (RSV, Flu A&B, Covid) Anterior Nasal Swab     Status: Abnormal   Collection Time: 04/06/23 11:43 PM   Specimen: Anterior Nasal Swab  Result Value Ref Range Status   SARS  Coronavirus 2 by RT PCR NEGATIVE NEGATIVE Final   Influenza A by PCR NEGATIVE NEGATIVE Final   Influenza B by PCR NEGATIVE NEGATIVE Final    Comment: (NOTE) The Xpert Xpress SARS-CoV-2/FLU/RSV plus assay is intended as an aid in the diagnosis of influenza from Nasopharyngeal swab specimens and should not be used as a sole basis for treatment. Nasal washings and aspirates are unacceptable for Xpert Xpress SARS-CoV-2/FLU/RSV testing.  Fact Sheet for Patients: BloggerCourse.com  Fact Sheet for Healthcare Providers: SeriousBroker.it  This test is not yet approved or cleared by the [place] FDA and has been authorized for detection and/or diagnosis of SARS-CoV-2 by FDA under an Emergency Use Authorization (EUA). This EUA will remain in effect (meaning this test can be used) for the duration of the COVID-19 declaration under Section 564(b)(1) of the Act, 21 U.S.C. section 360bbb-3(b)(1), unless the authorization is terminated or revoked.     Resp Syncytial Virus by PCR POSITIVE (A) NEGATIVE Final    Comment: (NOTE) Fact Sheet for Patients: BloggerCourse.com  Fact Sheet for Healthcare Providers: SeriousBroker.it  This test is not  yet approved or cleared by the [place] and has been authorized for detection and/or diagnosis of SARS-CoV-2 by FDA under an Emergency Use Authorization (EUA). This EUA will remain in effect (meaning this test can be used) for the duration of the COVID-19 declaration under Section 564(b)(1) of the Act, 21 U.S.C. section 360bbb-3(b)(1), unless the authorization is terminated or revoked.  Performed at [facility], [address], [place], [place] [address]      Radiological Exams on Admission: DG Chest 2 View Result Date: 04/06/2023 CLINICAL DATA:  Dyspnea EXAM: CHEST - 2 VIEW COMPARISON:  07/14/2022 FINDINGS: The lungs are  symmetrically well expanded. There is a perihilar interstitial thickening again seen in keeping with changes of mild interstitial pulmonary edema or airway inflammation. No confluent pulmonary infiltrate. No pneumothorax or pleural effusion. Cardiac size within normal limits. Pulmonary vascularity is normal. IMPRESSION: 1. Perihilar interstitial thickening in keeping with changes of mild interstitial pulmonary edema or airway inflammation. Electronically Signed   By: Helyn Numbers M.D.   On: 04/06/2023 23:46    EKG: Independently reviewed.  Atrial flutter.  Assessment/Plan Principal Problem:   Atrial flutter (HCC) Active Problems:   History of renal transplant   Hypertension   Anemia   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   Type 2 diabetes mellitus with complications (HCC)   End stage renal disease (HCC)   RSV (respiratory syncytial virus pneumonia)   Acute pulmonary edema (HCC)    Atrial flutter with RVR new onset -     heart rate improved without any intervention.  CHADS2 Vasc score of at least 2 on heparin infusion for now.  Check 2D echo.  Continue Coreg.  Will consult cardiology.  May need cardioversion if atrial flutter still persist. Possible new onset CHF will check 2D echo.  Fluid management per nephrology. Elevated troponin likely from demand ischemia from atrial flutter and CHF.  Denies any chest pain.  On heparin aspirin statins and beta-blockers.  Check 2D echo. Respiratory syncytial viral infection manage symptomatically. ESRD on hemodialysis Monday Wednesday Friday and prior history of failed renal transplant on Prograf.  Consult nephrology for dialysis. Anemia likely from ESRD follow CBC. Hypertension on losartan, metoprolol and Cardura. Hyperlipidemia on statins.  Since patient has new onset atrial flutter with CHF features will need close monitoring and more than 2 midnight stay in inpatient status.   DVT prophylaxis: Hep infusion. Code  Status: Full code. Family Communication: Discussed with patient. Disposition Plan: Cardiac telemetry. Consults called: I did discuss with cardiology. Admission status: Inpatient.

## 2023-04-07 NOTE — ED Notes (Signed)
Patient to go to cath lab, then dialysis per cath lab. Report given to dialysis nurse prior to patient going to cath lab.

## 2023-04-07 NOTE — Progress Notes (Signed)
PHARMACY - ANTICOAGULATION CONSULT NOTE  Pharmacy Consult for heparin Indication: atrial fibrillation  No Known Allergies  Patient Measurements: Height: 6' (182.9 cm) Weight: 88.9 kg (196 lb) IBW/kg (Calculated) : 77.6  Vital Signs: Temp: 98.1 F (36.7 C) (01/21 2316) BP: 145/111 (01/21 2316) Pulse Rate: 115 (01/21 2316)  Labs: Recent Labs    04/06/23 2113  HGB 10.2*  HCT 30.6*  PLT 168  CREATININE 7.86*  TROPONINIHS 122*    Estimated Creatinine Clearance: 10 mL/min (A) (by C-G formula based on SCr of 7.86 mg/dL (H)).   Medical History: Past Medical History:  Diagnosis Date   Arthritis    CHF (congestive heart failure) (HCC)    Chronic kidney disease    Diabetes mellitus without complication (HCC)    Hypertension     Assessment: 68yo male c/o SOB worse than baseline, found to be in Afib >> to begin heparin.  Goal of Therapy:  Heparin level 0.3-0.7 units/ml Monitor platelets by anticoagulation protocol: Yes   Plan:  Heparin 3000 units IV bolus followed by infusion at 1300 units/hr. Monitor heparin levels and CBC.  Vernard Gambles, PharmD, BCPS  04/07/2023,12:55 AM

## 2023-04-07 NOTE — Progress Notes (Signed)
Brief same day note:  Patient is a 68 year old male with history of ESRD on dialysis on Monday, Wednesday, Friday, failed renal transplant, hypertension, hyperlipidemia, chronic normocytic anemia who presented here with complaint of shortness of breath.  On presentation, he was found to be in A-fib with RVR with heart rate in the range of 110-120.  Chest x-ray showed possible pulmonary edema, BNP elevated.  Started on heparin drip.  Cardiology consulted.   Patient seen and examined at the bedside this morning.  He was in Afib rhythm  and heart  was lower below 100.  He was on room air.  He denied any chest pain shortness of breath.    Assessment and plan:  New onset A-fib with RVR: No history of A-fib.CHADS2 Vasc score of  least 2 .  Cardiology consulted.  Started on heparin drip.    On metoprolol  HFrEF: Echo 5 years ago had shown normal EF.  Echo done here showed EF of 30 to 35 %, global hypokinesis .  Cardiology consulted  Elevated BNP: Likely from volume overload.  Volume management as per dialysis.  Follow-up 2D echo  Elevated troponin: Likely from demand ischemia/chf.  No chest pain.  Echo as above  ESRD on dialysis: Dialyzed on Monday, Wednesday, Friday.  Nephrology consulted  Normocytic anemia: Likely from CKD  Hypertension: On losartan, metoprolol, Cardura  Hyperlipidemia: Statin  Hypokalemia: Supplemented with potassium

## 2023-04-07 NOTE — ED Notes (Signed)
Pt still in bathroom at this time. This RN checked on patient. Pt states "I am working my way out if you would just give me a second."

## 2023-04-07 NOTE — Progress Notes (Signed)
PHARMACY - ANTICOAGULATION CONSULT NOTE  Pharmacy Consult for heparin Indication: atrial fibrillation  No Known Allergies  Patient Measurements: Height: 6' (182.9 cm) Weight: 88.9 kg (196 lb) IBW/kg (Calculated) : 77.6  Vital Signs: Temp: 97.8 F (36.6 C) (01/22 0908) Temp Source: Oral (01/22 0600) BP: 132/107 (01/22 1030) Pulse Rate: 90 (01/22 1030)  Labs: Recent Labs    04/06/23 2113 04/07/23 0053 04/07/23 0304 04/07/23 0437 04/07/23 1023  HGB 10.2*  --   --  9.5*  --   HCT 30.6*  --   --  28.6*  --   PLT 168  --   --  160  --   HEPARINUNFRC  --   --   --   --  0.20*  CREATININE 7.86*  --   --  7.79*  --   TROPONINIHS 122* 107* 109* 124*  --     Estimated Creatinine Clearance: 10.1 mL/min (A) (by C-G formula based on SCr of 7.79 mg/dL (H)).   Medical History: Past Medical History:  Diagnosis Date   Arthritis    CHF (congestive heart failure) (HCC)    Diabetes mellitus without complication (HCC)    ESRD on hemodialysis (HCC)    Hypertension     Assessment: 68yo male c/o SOB worse than baseline, found to be in Afib >> to begin heparin.  HL 0.20 - subtherapeutic  Hgb 9.5, Plt 160  Goal of Therapy:  Heparin level 0.3-0.7 units/ml Monitor platelets by anticoagulation protocol: Yes   Plan:  Bolus heparin 1000 units IV x1 Increase heparin infusion 1500 units/hr 8hr HL  Daily HL, CBC  Calton Dach, PharmD, BCCCP Clinical Pharmacist 04/07/2023 11:34 AM

## 2023-04-07 NOTE — Consult Note (Signed)
Renal Service Consult Note [facility]  Jacob Daniel 04/07/2023 [name], MD Requesting Physician: Dr. Toniann Fail  Reason for Consult: ESRD pt w/ SOB HPI: The patient is a 68 y.o. year-old w/ PMH as below who presented to ED c/o SOB last night. Pt on HD mwf, last HD Monday. Has been SOB since his last HD. 94% in ED on RA, 100% on 2L. CXR showed vasc congestion/ early edema. Telemetry showed atrial fib w/ HR 90-120 bpm. BNP > 4500, trop 120, hb 10. IV heparin started and pt admitted for possible CHF.  Nephrology consulted for esrd.   Pmh of CM, HTN, CHF, DJD, esrd on HD. Hx of kidney transplant in 2010.   Pt seen in ED. Doesn't make much urine. Dry wt recently increased from 89.5kg to 90.5kg. Denies swelling. States his abdomen is always sticking out like it is today. States he fell going up to steps on Monday and has been "going downhill" since then. Last HD was Monday.   ROS - denies CP, no joint pain, no HA, no blurry vision, no rash, no diarrhea, no nausea/ vomiting, no dysuria, no difficulty voiding   Past Medical History  Past Medical History:  Diagnosis Date   Arthritis    CHF (congestive heart failure) (HCC)    Chronic kidney disease    Diabetes mellitus without complication (HCC)    Hypertension    Past Surgical History  Past Surgical History:  Procedure Laterality Date   KIDNEY TRANSPLANT  2010   SPINE SURGERY     Family History History reviewed. No pertinent family history. Social History  reports that he has never smoked. He has never used smokeless tobacco. He reports that he does not currently use alcohol after a past usage of about 1.0 standard drink of alcohol per week. He reports current drug use. Drug: Marijuana. Allergies No Known Allergies Home medications Prior to Admission medications   Medication Sig Start Date End Date Taking? Authorizing Provider  acetaminophen-codeine (TYLENOL #2) 300-15 MG tablet Take 1 tablet by mouth every  8 (eight) hours as needed for moderate pain (pain score 4-6). 02/25/23  Yes [provider]  albuterol (ACCUNEB) 0.63 MG/3ML nebulizer solution Take 1 ampule by nebulization every 6 (six) hours as needed for wheezing.   Yes [provider]  albuterol (VENTOLIN HFA) 108 (90 Base) MCG/ACT inhaler Inhale 2 puffs into the lungs every 6 (six) hours as needed for wheezing.   Yes [provider]  atorvastatin (LIPITOR) 10 MG tablet Take 10 mg by mouth daily. 07/02/20 04/06/24 Yes [provider]  ferrous sulfate 325 (65 FE) MG EC tablet Take 325 mg by mouth every Monday, Wednesday, and Friday. 08/21/20  Yes [provider]  indomethacin (INDOCIN) 25 MG capsule Take 25 mg by mouth 2 (two) times daily as needed (for gout flare (with a meal)).   Yes [provider]  ipratropium-albuterol (DUONEB) 0.5-2.5 (3) MG/3ML SOLN Take 3 mLs by nebulization every 6 (six) hours as needed (for shortness of breath and wheezing). 02/16/23  Yes [provider]  losartan (COZAAR) 100 MG tablet Take 100 mg by mouth daily. 02/26/23  Yes [provider]  magnesium oxide (MAG-OX) 400 MG tablet Take 400 mg by mouth daily.  07/14/19  Yes [provider]  metoprolol tartrate (LOPRESSOR) 25 MG tablet Take 25 mg by mouth 2 (two) times daily. 04/01/23  Yes [provider]  sildenafil (REVATIO) 20 MG tablet Take 20-60 mg by mouth  daily as needed (erectile dysfunction). 06/03/20  Yes [provider]  tacrolimus (PROGRAF) 1 MG capsule Take 1 mg by mouth daily. 10/08/14  Yes [provider]  VELPHORO 500 MG chewable tablet Chew 500 mg by mouth in the morning. 12/15/22  Yes [provider]  calcium acetate (PHOSLO) 667 MG capsule Take 2,001 mg by mouth 3 (three) times daily with meals. Patient not taking: Reported on 04/07/2023 07/27/20   [provider]  carvedilol (COREG) 25 MG tablet Take 50 mg by mouth at bedtime. Patient not  taking: Reported on 04/07/2023 11/13/20 11/13/21  [provider]  doxazosin (CARDURA) 4 MG tablet Take 4 mg by mouth at bedtime. Patient not taking: Reported on 04/07/2023 10/08/14   [provider]  sodium bicarbonate 650 MG tablet Take 650 mg by mouth 2 (two) times daily. Patient not taking: Reported on 04/07/2023 07/26/19   [provider]     Vitals:   04/07/23 0530 04/07/23 0600 04/07/23 0700 04/07/23 0908  BP: (!) 144/102 (!) 143/107 (!) 140/108 (!) 131/94  Pulse: (!) 102 97 89 91  Resp: (!) 31 17 12 12   Temp:  98.1 F (36.7 C)  97.8 F (36.6 C)  TempSrc:  Oral    SpO2: 93% 100% 100% 99%  Weight:      Height:       Exam Gen alert, no distress, on RA No rash, cyanosis or gangrene Sclera anicteric, throat clear  +JVD Chest bilat basilar crackles RRR no MRG Abd protruding, not firm not soft in between, poss ascites, +bs GU nl male MS no joint effusions or deformity Ext 2+ bilat pretib L > R edema, and 1+ bilat hip edema Neuro is alert, Ox 3 , nf    LUA AVF +bruit       Renal-related home meds: - losartan 100 every day/ metoprolol 25 bid/ coreg 50 hs/ cardura 4 hs - sod bicarb 650 bid - velphoro 1 ac/ phoslo 3 ac tid - prograf 1 mg daily    OP HD: [facility] MWF      LUA AVF    Assessment/ Plan: Acute hypoxic resp failure - pt w/ SOB and early edema on CXR. Hasn't missed HD. He is grossly vol overloaded on exam w/ JVD and diffuse LE edema. States he is getting to his dry wt. Probably is losing body wt. Plan is for HD this afternoon or this evening.  ESRD - on HD MWF. Last HD Monday. HD today / tonight as above.  HTN - BP's up a bit here. Cont home meds, get vol down w/ HD.  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 Follow.   Secondary hyperparathyroidism - CCa in range, add on phos. Cont binders w/ meals.  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       Vinson Moselle  MD [facility] 04/07/2023, 9:21 AM  Recent  Labs  Lab 04/06/23 2113 04/07/23 0437  HGB 10.2* 9.5*  ALBUMIN  --  2.5*  CALCIUM 8.3* 7.8*  CREATININE 7.86* 7.79*  K 3.5 3.2*   Inpatient medications:  atorvastatin  10 mg Oral Daily   calcium acetate  2,001 mg Oral TID WC   doxazosin  4 mg Oral QHS   ferrous sulfate  325 mg Oral Q breakfast   losartan  100 mg Oral Daily   magnesium oxide  400 mg Oral Daily   metoprolol tartrate  25 mg Oral BID  sodium bicarbonate  650 mg Oral BID   sucroferric oxyhydroxide  500 mg Oral q AM   tacrolimus  1 mg Oral Daily    heparin 1,300 Units/hr (04/07/23 0114)   acetaminophen **OR** acetaminophen, albuterol

## 2023-04-08 ENCOUNTER — Inpatient Hospital Stay (HOSPITAL_COMMUNITY)
Admission: EM | Disposition: A | Discharge status: Home / Self Care | Payer: Self-pay | Source: Home / Self Care | Attending: Internal Medicine

## 2023-04-08 DIAGNOSIS — R9439 Abnormal result of other cardiovascular function study: Secondary | ICD-10-CM | POA: Diagnosis not present

## 2023-04-08 DIAGNOSIS — I4892 Unspecified atrial flutter: Secondary | ICD-10-CM | POA: Diagnosis not present

## 2023-04-08 DIAGNOSIS — I5023 Acute on chronic systolic (congestive) heart failure: Secondary | ICD-10-CM | POA: Diagnosis not present

## 2023-04-08 DIAGNOSIS — I2510 Atherosclerotic heart disease of native coronary artery without angina pectoris: Secondary | ICD-10-CM | POA: Diagnosis not present

## 2023-04-08 DIAGNOSIS — I420 Dilated cardiomyopathy: Secondary | ICD-10-CM | POA: Diagnosis not present

## 2023-04-08 HISTORY — PX: RIGHT/LEFT HEART CATH AND CORONARY ANGIOGRAPHY: CATH118266

## 2023-04-08 LAB — POCT I-STAT EG7
Acid-Base Excess: 4 mmol/L — ABNORMAL HIGH (ref 0.0–2.0)
Acid-Base Excess: 4 mmol/L — ABNORMAL HIGH (ref 0.0–2.0)
Bicarbonate: 29.1 mmol/L — ABNORMAL HIGH (ref 20.0–28.0)
Bicarbonate: 29.2 mmol/L — ABNORMAL HIGH (ref 20.0–28.0)
Calcium, Ion: 1.07 mmol/L — ABNORMAL LOW (ref 1.15–1.40)
Calcium, Ion: 1.07 mmol/L — ABNORMAL LOW (ref 1.15–1.40)
HCT: 35 % — ABNORMAL LOW (ref 39.0–52.0)
HCT: 36 % — ABNORMAL LOW (ref 39.0–52.0)
Hemoglobin: 11.9 g/dL — ABNORMAL LOW (ref 13.0–17.0)
Hemoglobin: 12.2 g/dL — ABNORMAL LOW (ref 13.0–17.0)
O2 Saturation: 63 %
O2 Saturation: 63 %
Potassium: 3.9 mmol/L (ref 3.5–5.1)
Potassium: 3.9 mmol/L (ref 3.5–5.1)
Sodium: 135 mmol/L (ref 135–145)
Sodium: 135 mmol/L (ref 135–145)
TCO2: 31 mmol/L (ref 22–32)
TCO2: 31 mmol/L (ref 22–32)
pCO2, Ven: 47.2 mm[Hg] (ref 44–60)
pCO2, Ven: 47.3 mm[Hg] (ref 44–60)
pH, Ven: 7.398 (ref 7.25–7.43)
pH, Ven: 7.399 (ref 7.25–7.43)
pO2, Ven: 33 mm[Hg] (ref 32–45)
pO2, Ven: 33 mm[Hg] (ref 32–45)

## 2023-04-08 LAB — POCT I-STAT 7, (LYTES, BLD GAS, ICA,H+H)
Acid-Base Excess: 4 mmol/L — ABNORMAL HIGH (ref 0.0–2.0)
Bicarbonate: 29 mmol/L — ABNORMAL HIGH (ref 20.0–28.0)
Calcium, Ion: 1.07 mmol/L — ABNORMAL LOW (ref 1.15–1.40)
HCT: 36 % — ABNORMAL LOW (ref 39.0–52.0)
Hemoglobin: 12.2 g/dL — ABNORMAL LOW (ref 13.0–17.0)
O2 Saturation: 98 %
Potassium: 3.9 mmol/L (ref 3.5–5.1)
Sodium: 135 mmol/L (ref 135–145)
TCO2: 30 mmol/L (ref 22–32)
pCO2 arterial: 44.4 mm[Hg] (ref 32–48)
pH, Arterial: 7.423 (ref 7.35–7.45)
pO2, Arterial: 107 mm[Hg] (ref 83–108)

## 2023-04-08 LAB — LIPID PANEL
Cholesterol: 90 mg/dL (ref 0–200)
HDL: 34 mg/dL — ABNORMAL LOW (ref >40)
LDL Cholesterol: 45 mg/dL (ref 0–99)
Total CHOL/HDL Ratio: 2.6 {ratio}
Triglycerides: 53 mg/dL (ref <150)
VLDL: 11 mg/dL (ref 0–40)

## 2023-04-08 LAB — HEPARIN LEVEL (UNFRACTIONATED): Heparin Unfractionated: 0.45 [IU]/mL (ref 0.30–0.70)

## 2023-04-08 LAB — BASIC METABOLIC PANEL
Anion gap: 16 — ABNORMAL HIGH (ref 5–15)
BUN: 60 mg/dL — ABNORMAL HIGH (ref 8–23)
CO2: 20 mmol/L — ABNORMAL LOW (ref 22–32)
Calcium: 8.3 mg/dL — ABNORMAL LOW (ref 8.9–10.3)
Chloride: 96 mmol/L — ABNORMAL LOW (ref 98–111)
Creatinine, Ser: 9.23 mg/dL — ABNORMAL HIGH (ref 0.61–1.24)
GFR, Estimated: 6 mL/min — ABNORMAL LOW (ref >60)
Glucose, Bld: 93 mg/dL (ref 70–99)
Potassium: 5 mmol/L (ref 3.5–5.1)
Sodium: 132 mmol/L — ABNORMAL LOW (ref 135–145)

## 2023-04-08 LAB — CBC
HCT: 33 % — ABNORMAL LOW (ref 39.0–52.0)
Hemoglobin: 11 g/dL — ABNORMAL LOW (ref 13.0–17.0)
MCH: 29.4 pg (ref 26.0–34.0)
MCHC: 33.3 g/dL (ref 30.0–36.0)
MCV: 88.2 fL (ref 80.0–100.0)
Platelets: 179 10*3/uL (ref 150–400)
RBC: 3.74 MIL/uL — ABNORMAL LOW (ref 4.22–5.81)
RDW: 18.2 % — ABNORMAL HIGH (ref 11.5–15.5)
WBC: 8.4 10*3/uL (ref 4.0–10.5)
nRBC: 0.4 % — ABNORMAL HIGH (ref 0.0–0.2)

## 2023-04-08 SURGERY — RIGHT/LEFT HEART CATH AND CORONARY ANGIOGRAPHY
Anesthesia: LOCAL

## 2023-04-08 MED ORDER — SODIUM CHLORIDE 0.9 % IV SOLN: 250.0000 mL | INTRAVENOUS | Status: AC | PRN

## 2023-04-08 MED ORDER — HEPARIN SODIUM (PORCINE) 1000 UNIT/ML IJ SOLN
INTRAMUSCULAR | Status: AC
  Filled 2023-04-08: qty 10

## 2023-04-08 MED ORDER — SODIUM CHLORIDE 0.9% FLUSH: 3.0000 mL | INTRAVENOUS | Status: DC | PRN

## 2023-04-08 MED ORDER — VERAPAMIL HCL 2.5 MG/ML IV SOLN
INTRAVENOUS | Status: AC
  Filled 2023-04-08: qty 2

## 2023-04-08 MED ORDER — HEPARIN (PORCINE) IN NACL 1000-0.9 UT/500ML-% IV SOLN
INTRAVENOUS | Status: DC | PRN
  Administered 2023-04-08 (×2): 500 mL

## 2023-04-08 MED ORDER — METOPROLOL TARTRATE 25 MG PO TABS
25.0000 mg | ORAL_TABLET | Freq: Two times a day (BID) | ORAL | Status: DC
  Filled 2023-04-08: qty 1

## 2023-04-08 MED ORDER — METOPROLOL SUCCINATE ER 50 MG PO TB24
50.0000 mg | ORAL_TABLET | Freq: Every day | ORAL | Status: DC
  Administered 2023-04-09: 50 mg via ORAL
  Filled 2023-04-08: qty 1

## 2023-04-08 MED ORDER — HYDROCOD POLI-CHLORPHE POLI ER 10-8 MG/5ML PO SUER
5.0000 mL | Freq: Two times a day (BID) | ORAL | Status: DC
  Administered 2023-04-09 – 2023-04-10 (×3): 5 mL via ORAL
  Filled 2023-04-08 (×4): qty 5

## 2023-04-08 MED ORDER — LIDOCAINE HCL (PF) 1 % IJ SOLN
INTRAMUSCULAR | Status: AC
Start: 2023-04-08 — End: ?
  Filled 2023-04-08: qty 30

## 2023-04-08 MED ORDER — LIDOCAINE HCL (PF) 1 % IJ SOLN
INTRAMUSCULAR | Status: DC | PRN
  Administered 2023-04-08: 12 mL

## 2023-04-08 MED ORDER — IOHEXOL 350 MG/ML SOLN
INTRAVENOUS | Status: DC | PRN
  Administered 2023-04-08: 40 mL

## 2023-04-08 MED ORDER — FENTANYL CITRATE (PF) 100 MCG/2ML IJ SOLN
INTRAMUSCULAR | Status: DC | PRN
  Administered 2023-04-08: 25 ug via INTRAVENOUS

## 2023-04-08 MED ORDER — FENTANYL CITRATE (PF) 100 MCG/2ML IJ SOLN
INTRAMUSCULAR | Status: AC
  Filled 2023-04-08: qty 2

## 2023-04-08 MED ORDER — IRBESARTAN 150 MG PO TABS
300.0000 mg | ORAL_TABLET | Freq: Every day | ORAL | Status: DC
  Administered 2023-04-09 – 2023-04-10 (×2): 300 mg via ORAL
  Filled 2023-04-08 (×3): qty 2

## 2023-04-08 MED ORDER — HEPARIN (PORCINE) 25000 UT/250ML-% IV SOLN
1750.0000 [IU]/h | INTRAVENOUS | Status: DC
  Administered 2023-04-09: 1750 [IU]/h via INTRAVENOUS
  Filled 2023-04-08: qty 250

## 2023-04-08 SURGICAL SUPPLY — 16 items
CATH INFINITI 5FR MPB2 (CATHETERS) IMPLANT
CATH INFINITI 5FR MULTPACK ANG (CATHETERS) IMPLANT
CATH SWAN GANZ 7F STRAIGHT (CATHETERS) IMPLANT
CLOSURE MYNX CONTROL 5F (Vascular Products) IMPLANT
CLOSURE MYNX CONTROL 6F/7F (Vascular Products) IMPLANT
GUIDEWIRE INQWIRE 1.5J.035X260 (WIRE) IMPLANT
INQWIRE 1.5J .035X260CM (WIRE) ×1 IMPLANT
KIT SINGLE USE MANIFOLD (KITS) IMPLANT
PACK CARDIAC CATHETERIZATION (CUSTOM PROCEDURE TRAY) ×1 IMPLANT
SET ATX-X65L (MISCELLANEOUS) IMPLANT
SHEATH PINNACLE 5F 10CM (SHEATH) IMPLANT
SHEATH PINNACLE 6F 10CM (SHEATH) IMPLANT
SHEATH PINNACLE 7F 10CM (SHEATH) IMPLANT
WIRE EMERALD 3MM-J .035X150CM (WIRE) IMPLANT
WIRE MICRO SET 5FR 12 (WIRE) IMPLANT
WIRE MICROINTRODUCER 60CM (WIRE) IMPLANT

## 2023-04-08 NOTE — H&P (View-Only) (Signed)
Progress Note  Patient Name: Jacob Daniel Date of Encounter: 04/08/2023  Primary Cardiologist: Armanda Magic, MD  Subjective   No CP. Feeling a little better but still some SOB. Per d/w Victorino Dike in cath lab she spoke with patient's team already and patient is planned for HD today then cath last case (end of day due to RSV).  Inpatient Medications    Scheduled Meds:  aspirin EC  81 mg Oral Daily   atorvastatin  10 mg Oral Daily   calcium acetate  2,001 mg Oral TID WC   doxazosin  4 mg Oral QHS   ferrous sulfate  325 mg Oral Q breakfast   heparin  2,500 Units Dialysis Once in dialysis   losartan  100 mg Oral Daily   magnesium oxide  400 mg Oral Daily   metoprolol tartrate  25 mg Oral BID   sodium bicarbonate  650 mg Oral BID   sucroferric oxyhydroxide  500 mg Oral q AM   tacrolimus  1 mg Oral Daily   Continuous Infusions:  sodium chloride     anticoagulant sodium citrate     heparin 1,750 Units/hr (04/08/23 0558)   PRN Meds: acetaminophen **OR** acetaminophen, albuterol, alteplase, anticoagulant sodium citrate, feeding supplement (NEPRO CARB STEADY), heparin, heparin, lidocaine (PF), lidocaine-prilocaine, pentafluoroprop-tetrafluoroeth   Vital Signs    Vitals:   04/08/23 0454 04/08/23 0500 04/08/23 0718 04/08/23 0723  BP: 94/78  (!) 147/101 (!) 88/73  Pulse: 75  85 79  Resp: 18  18   Temp: (!) 97.5 F (36.4 C)  97.6 F (36.4 C)   TempSrc: Oral  Oral   SpO2:   98% 94%  Weight:  92.5 kg    Height:        Intake/Output Summary (Last 24 hours) at 04/08/2023 0832 Last data filed at 04/07/2023 1749 Gross per 24 hour  Intake 495.29 ml  Output --  Net 495.29 ml      04/08/2023    5:00 AM 04/07/2023    6:47 PM 04/07/2023   12:37 AM  Last 3 Weights  Weight (lbs) 203 lb 14.4 oz 204 lb 11.2 oz 196 lb  Weight (kg) 92.488 kg 92.851 kg 88.905 kg     Telemetry    AF rates controlled, 10 beats of wider complex suspect NSVT vs AF with abberrant conduction - Personally  Reviewed   Physical Exam   GEN: No acute distress.  HEENT: Normocephalic, atraumatic, sclera non-icteric. Neck: No JVD or bruits. Cardiac: irregularly irregular, no murmurs, rubs, or gallops.  Respiratory: Decreased BS at bases auscultation bilaterally. No wheezing or rhonchi. Breathing is unlabored. GI: Soft but rounded, BS +x 4. MS: no deformity. Extremities: No clubbing or cyanosis. No edema. Distal pedal pulses are 2+ and equal bilaterally. Neuro:  AAOx3. Follows commands. Psych:  Responds to questions appropriately with a normal affect.  Labs    High Sensitivity Troponin:   Recent Labs  Lab 04/06/23 2113 04/07/23 0053 04/07/23 0304 04/07/23 0437  TROPONINIHS 122* 107* 109* 124*      Cardiac EnzymesNo results for input(s): "TROPONINI" in the last 168 hours. No results for input(s): "TROPIPOC" in the last 168 hours.   Chemistry Recent Labs  Lab 04/06/23 2113 04/07/23 0437  NA 137 135  K 3.5 3.2*  CL 98 99  CO2 22 25  GLUCOSE 103* 88  BUN 47* 45*  CREATININE 7.86* 7.79*  CALCIUM 8.3* 7.8*  PROT  --  7.2  ALBUMIN  --  2.5*  AST  --  21  ALT  --  20  ALKPHOS  --  63  BILITOT  --  1.8*  GFRNONAA 7* 7*  ANIONGAP 17* 11     Hematology Recent Labs  Lab 04/06/23 2113 04/07/23 0437 04/08/23 0657  WBC 7.4 7.2 8.4  RBC 3.48* 3.26* 3.74*  HGB 10.2* 9.5* 11.0*  HCT 30.6* 28.6* 33.0*  MCV 87.9 87.7 88.2  MCH 29.3 29.1 29.4  MCHC 33.3 33.2 33.3  RDW 17.3* 17.2* 18.2*  PLT 168 160 179    BNP Recent Labs  Lab 04/06/23 2113  BNP >4,500.0*     DDimer No results for input(s): "DDIMER" in the last 168 hours.   Radiology    ECHOCARDIOGRAM COMPLETE Result Date: 04/07/2023    ECHOCARDIOGRAM REPORT   Patient Name:   Jacob Daniel Date of Exam: 04/07/2023 Medical Rec #:  [id number]        Height:       72.0 in Accession #:    [id number]       Weight:       196.0 lb Date of Birth:  12-01-1955        BSA:          2.112 m Patient Age:    68 years         BP:            134/106 mmHg Patient Gender: M                HR:           71 bpm. Exam Location:  Inpatient Procedure: 2D Echo, Cardiac Doppler, Color Doppler and Intracardiac            Opacification Agent Indications:    I48.91* Unspeicified atrial fibrillation  History:        Patient has no prior history of Echocardiogram examinations. CHF                 and Cardiomyopathy, Arrythmias:Atrial Flutter; Risk                 Factors:Hypertension and Diabetes.  Sonographer:    Webb Laws Referring Phys: [id number] ARSHAD N KAKRAKANDY IMPRESSIONS  1. Left ventricular ejection fraction, by estimation, is 30 to 35%. The left ventricle has moderately decreased function. The left ventricle demonstrates global hypokinesis. There is mild left ventricular hypertrophy. Left ventricular diastolic parameters are indeterminate.  2. Right ventricular systolic function is moderately reduced. The right ventricular size is mildly enlarged. There is mildly elevated pulmonary artery systolic pressure. The estimated right ventricular systolic pressure is 44.5 mmHg.  3. Left atrial size was mildly dilated.  4. The mitral valve is normal in structure. Mild mitral valve regurgitation. No evidence of mitral stenosis.  5. The aortic valve is tricuspid. Aortic valve regurgitation is trivial. No aortic stenosis is present.  6. Aortic dilatation noted. There is dilatation of the ascending aorta, measuring 40 mm.  7. The inferior vena cava is dilated in size with >50% respiratory variability, suggesting right atrial pressure of 8 mmHg. FINDINGS  Left Ventricle: Left ventricular ejection fraction, by estimation, is 30 to 35%. The left ventricle has moderately decreased function. The left ventricle demonstrates global hypokinesis. Definity contrast agent was given IV to delineate the left ventricular endocardial borders. The left ventricular internal cavity size was normal in size. There is mild left ventricular hypertrophy. Left ventricular  diastolic parameters are indeterminate. Right Ventricle: The right ventricular size is mildly enlarged. No increase in  right ventricular wall thickness. Right ventricular systolic function is moderately reduced. There is mildly elevated pulmonary artery systolic pressure. The tricuspid regurgitant velocity is 3.02 m/s, and with an assumed right atrial pressure of 8 mmHg, the estimated right ventricular systolic pressure is 44.5 mmHg. Left Atrium: Left atrial size was mildly dilated. Right Atrium: Right atrial size was normal in size. Pericardium: There is no evidence of pericardial effusion. Mitral Valve: The mitral valve is normal in structure. Mild mitral valve regurgitation. No evidence of mitral valve stenosis. Tricuspid Valve: The tricuspid valve is normal in structure. Tricuspid valve regurgitation is mild. Aortic Valve: The aortic valve is tricuspid. Aortic valve regurgitation is trivial. No aortic stenosis is present. Pulmonic Valve: The pulmonic valve was not well visualized. Pulmonic valve regurgitation is trivial. Aorta: The aortic root is normal in size and structure and aortic dilatation noted. There is dilatation of the ascending aorta, measuring 40 mm. Venous: The inferior vena cava is dilated in size with greater than 50% respiratory variability, suggesting right atrial pressure of 8 mmHg. IAS/Shunts: The interatrial septum was not well visualized.  LEFT VENTRICLE PLAX 2D LVIDd:         5.70 cm      Diastology LVIDs:         4.80 cm      LV e' medial:    5.91 cm/s LV PW:         1.00 cm      LV E/e' medial:  19.8 LV IVS:        1.10 cm      LV e' lateral:   12.10 cm/s LVOT diam:     2.00 cm      LV E/e' lateral: 9.7 LV SV:         39 LV SV Index:   19 LVOT Area:     3.14 cm  LV Volumes (MOD) LV vol d, MOD A2C: 132.0 ml LV vol d, MOD A4C: 104.0 ml LV vol s, MOD A2C: 64.1 ml LV vol s, MOD A4C: 72.0 ml LV SV MOD A2C:     67.9 ml LV SV MOD A4C:     104.0 ml LV SV MOD BP:      48.3 ml RIGHT VENTRICLE             IVC RV Basal diam:  4.10 cm    IVC diam: 2.90 cm RV S prime:     8.70 cm/s TAPSE (M-mode): 1.9 cm LEFT ATRIUM             Index        RIGHT ATRIUM           Index LA diam:        4.50 cm 2.13 cm/m   RA Area:     19.45 cm LA Vol (A2C):   59.8 ml 28.31 ml/m  RA Volume:   50.80 ml  24.05 ml/m LA Vol (A4C):   94.8 ml 44.88 ml/m LA Biplane Vol: 77.4 ml 36.64 ml/m  AORTIC VALVE LVOT Vmax:   92.12 cm/s LVOT Vmean:  55.320 cm/s LVOT VTI:    0.125 m  AORTA Ao Root diam: 3.60 cm Ao Asc diam:  4.00 cm MITRAL VALVE                TRICUSPID VALVE MV Area (PHT): 5.97 cm     TR Peak grad:   36.5 mmHg MV Decel Time: 127 msec     TR Vmax:  302.00 cm/s MR Peak grad: 86.5 mmHg MR Mean grad: 59.0 mmHg     SHUNTS MR Vmax:      465.00 cm/s   Systemic VTI:  0.13 m MR Vmean:     361.0 cm/s    Systemic Diam: 2.00 cm MV E velocity: 117.00 cm/s Epifanio Lesches MD Electronically signed by Epifanio Lesches MD Signature Date/Time: 04/07/2023/11:50:24 AM    Final    DG Chest 2 View Result Date: 04/06/2023 CLINICAL DATA:  Dyspnea EXAM: CHEST - 2 VIEW COMPARISON:  07/14/2022 FINDINGS: The lungs are symmetrically well expanded. There is a perihilar interstitial thickening again seen in keeping with changes of mild interstitial pulmonary edema or airway inflammation. No confluent pulmonary infiltrate. No pneumothorax or pleural effusion. Cardiac size within normal limits. Pulmonary vascularity is normal. IMPRESSION: 1. Perihilar interstitial thickening in keeping with changes of mild interstitial pulmonary edema or airway inflammation. Electronically Signed   By: Helyn Numbers M.D.   On: 04/06/2023 23:46    Cardiac Studies   2d echo 04/07/23   1. Left ventricular ejection fraction, by estimation, is 30 to 35%. The  left ventricle has moderately decreased function. The left ventricle  demonstrates global hypokinesis. There is mild left ventricular  hypertrophy. Left ventricular diastolic  parameters are  indeterminate.   2. Right ventricular systolic function is moderately reduced. The right  ventricular size is mildly enlarged. There is mildly elevated pulmonary  artery systolic pressure. The estimated right ventricular systolic  pressure is 44.5 mmHg.   3. Left atrial size was mildly dilated.   4. The mitral valve is normal in structure. Mild mitral valve  regurgitation. No evidence of mitral stenosis.   5. The aortic valve is tricuspid. Aortic valve regurgitation is trivial.  No aortic stenosis is present.   6. Aortic dilatation noted. There is dilatation of the ascending aorta,  measuring 40 mm.   7. The inferior vena cava is dilated in size with >50% respiratory  variability, suggesting right atrial pressure of 8 mmHg.   Patient Profile     68 y.o. male with chronic HFrEF (no prior cath), ESRD on hemodialysis on Mon/Wed/Fri, failed renal transplant takes Prograf, prior nonadherence with medications/behavioral concerns, hypertension, hyperlipidemia, chronic anemia. Presented with worsening SOB/wheezing, recent mechanical fall, found to have atrial fib RVR by EMS report, here in Afib/flutter. Resp viral panel + RSV.  Assessment & Plan    1. New onset atrial fib/flutter - carvedilol 50mg  at bedtime and metoprolol 25mg  BID listed on med list, listed as not taking carvedilol ->  - TSH OK - BPs variable, folow - HR currently 70s-90s on metoprolol 25mg  BID, continue, anticipate transition to Toprol at DC for cardiomyopathy per notes  2. RSV infection with wheezing - care per primary team  3. Acute on chronic HFrEF - prior workup in [facility] -  EF last known to be normal 05/2022; echo 07/2022 EF 40% with nuc at that time showing mild inferolateral hypokinesia, fixed inferior wall defect, EF 51%, then repeat echo 10/2022 EF 35-40% with moderate-severe TR, mild-moderate MR - cath advised, patient preferred echo follow-up instead, lost to f/u after - 2d echo here EF 30-35%, mild LVH,  global HK, moderately reduced RV function, mild pHTN, mild MR, mild dilation of ascending aorta - Dr. Mayford Knife has recommended [facility] today to further definite coronaries/etiology of cardiomyopathy - patient remains agreeable; also agrees to compliance with meds if PCI performed  4. Elevated troponin level, extensive coronary calcification by prior CT - hsTroponins  low/flat [id number]  - currently on heparin for AF - LDL 45, acceptable, on atorvastatin 10mg  daily - for cath today as above  5. ESRD on HD  - volume management per nephrology  6. HTN  - manage in context above  7. Mild dilation of ascending aorta - by echo - OP f/u  For questions or updates, please contact [facility] Please consult [email or web address] for contact info under Cardiology/STEMI.  Signed, Laurann Montana, PA-C 04/08/2023, 8:32 AM

## 2023-04-08 NOTE — Progress Notes (Signed)
PHARMACY - ANTICOAGULATION CONSULT NOTE  Pharmacy Consult for heparin Indication: atrial fibrillation  No Known Allergies  Patient Measurements: Height: 6' (182.9 cm) Weight: 89 kg (196 lb 3.4 oz) IBW/kg (Calculated) : 77.6  Vital Signs: Temp: 97.7 F (36.5 C) (01/23 1756) Temp Source: Oral (01/23 1756) BP: 196/117 (01/23 1756) Pulse Rate: 0 (01/23 1800)  Labs: Recent Labs    04/06/23 2113 04/07/23 0053 04/07/23 0304 04/07/23 0437 04/07/23 1023 04/07/23 2053 04/08/23 0657 04/08/23 1542 04/08/23 1703 04/08/23 1707  HGB 10.2*  --   --  9.5*  --   --  11.0*  --  12.2* 12.2*  11.9*  HCT 30.6*  --   --  28.6*  --   --  33.0*  --  36.0* 36.0*  35.0*  PLT 168  --   --  160  --   --  179  --   --   --   HEPARINUNFRC  --   --   --   --  0.20* 0.19*  --  0.45  --   --   CREATININE 7.86*  --   --  7.79*  --   --  9.23*  --   --   --   TROPONINIHS 122* 107* 109* 124*  --   --   --   --   --   --     Estimated Creatinine Clearance: 8.5 mL/min (A) (by C-G formula based on SCr of 9.23 mg/dL (H)).   Medical History: Past Medical History:  Diagnosis Date   Arthritis    CHF (congestive heart failure) (HCC)    Diabetes mellitus without complication (HCC)    ESRD on hemodialysis (HCC)    Hypertension     Assessment: 68yo male c/o SOB worse than baseline, found to be in Afib. Patient taken for RHC/LHC 1/23 with minx closure at 17:45. Per MD, OK to start anticoagulating with heparin or apixaban 6 hours post-closure. Given the time of administration will be ~ midnight, will plan to start heparin and patient can be transitioned to oral anticoagulation in the AM.   Goal of Therapy:  Heparin level 0.3-0.7 units/ml Monitor platelets by anticoagulation protocol: Yes   Plan:  Restart heparin at 1,750 units/hour starting at 23:45- no bolus   F/U transition to DOAC  CBC in AM   Jani Gravel, PharmD Clinical Pharmacist  04/08/2023 7:48 PM

## 2023-04-08 NOTE — Progress Notes (Addendum)
Progress Note  Patient Name: Jacob Daniel Date of Encounter: 04/08/2023  Primary Cardiologist: Armanda Magic, MD  Subjective   No CP. Feeling a little better but still some SOB. Per d/w Victorino Dike in cath lab she spoke with patient's team already and patient is planned for HD today then cath last case (end of day due to RSV).  Inpatient Medications    Scheduled Meds:  aspirin EC  81 mg Oral Daily   atorvastatin  10 mg Oral Daily   calcium acetate  2,001 mg Oral TID WC   doxazosin  4 mg Oral QHS   ferrous sulfate  325 mg Oral Q breakfast   heparin  2,500 Units Dialysis Once in dialysis   losartan  100 mg Oral Daily   magnesium oxide  400 mg Oral Daily   metoprolol tartrate  25 mg Oral BID   sodium bicarbonate  650 mg Oral BID   sucroferric oxyhydroxide  500 mg Oral q AM   tacrolimus  1 mg Oral Daily   Continuous Infusions:  sodium chloride     anticoagulant sodium citrate     heparin 1,750 Units/hr (04/08/23 0558)   PRN Meds: acetaminophen **OR** acetaminophen, albuterol, alteplase, anticoagulant sodium citrate, feeding supplement (NEPRO CARB STEADY), heparin, heparin, lidocaine (PF), lidocaine-prilocaine, pentafluoroprop-tetrafluoroeth   Vital Signs    Vitals:   04/08/23 0454 04/08/23 0500 04/08/23 0718 04/08/23 0723  BP: 94/78  (!) 147/101 (!) 88/73  Pulse: 75  85 79  Resp: 18  18   Temp: (!) 97.5 F (36.4 C)  97.6 F (36.4 C)   TempSrc: Oral  Oral   SpO2:   98% 94%  Weight:  92.5 kg    Height:        Intake/Output Summary (Last 24 hours) at 04/08/2023 0832 Last data filed at 04/07/2023 1749 Gross per 24 hour  Intake 495.29 ml  Output --  Net 495.29 ml      04/08/2023    5:00 AM 04/07/2023    6:47 PM 04/07/2023   12:37 AM  Last 3 Weights  Weight (lbs) 203 lb 14.4 oz 204 lb 11.2 oz 196 lb  Weight (kg) 92.488 kg 92.851 kg 88.905 kg     Telemetry    AF rates controlled, 10 beats of wider complex suspect NSVT vs AF with abberrant conduction - Personally  Reviewed   Physical Exam   GEN: No acute distress.  HEENT: Normocephalic, atraumatic, sclera non-icteric. Neck: No JVD or bruits. Cardiac: irregularly irregular, no murmurs, rubs, or gallops.  Respiratory: Decreased BS at bases auscultation bilaterally. No wheezing or rhonchi. Breathing is unlabored. GI: Soft but rounded, BS +x 4. MS: no deformity. Extremities: No clubbing or cyanosis. No edema. Distal pedal pulses are 2+ and equal bilaterally. Neuro:  AAOx3. Follows commands. Psych:  Responds to questions appropriately with a normal affect.  Labs    High Sensitivity Troponin:   Recent Labs  Lab 04/06/23 2113 04/07/23 0053 04/07/23 0304 04/07/23 0437  TROPONINIHS 122* 107* 109* 124*      Cardiac EnzymesNo results for input(s): "TROPONINI" in the last 168 hours. No results for input(s): "TROPIPOC" in the last 168 hours.   Chemistry Recent Labs  Lab 04/06/23 2113 04/07/23 0437  NA 137 135  K 3.5 3.2*  CL 98 99  CO2 22 25  GLUCOSE 103* 88  BUN 47* 45*  CREATININE 7.86* 7.79*  CALCIUM 8.3* 7.8*  PROT  --  7.2  ALBUMIN  --  2.5*  AST  --  21  ALT  --  20  ALKPHOS  --  63  BILITOT  --  1.8*  GFRNONAA 7* 7*  ANIONGAP 17* 11     Hematology Recent Labs  Lab 04/06/23 2113 04/07/23 0437 04/08/23 0657  WBC 7.4 7.2 8.4  RBC 3.48* 3.26* 3.74*  HGB 10.2* 9.5* 11.0*  HCT 30.6* 28.6* 33.0*  MCV 87.9 87.7 88.2  MCH 29.3 29.1 29.4  MCHC 33.3 33.2 33.3  RDW 17.3* 17.2* 18.2*  PLT 168 160 179    BNP Recent Labs  Lab 04/06/23 2113  BNP >4,500.0*     DDimer No results for input(s): "DDIMER" in the last 168 hours.   Radiology    ECHOCARDIOGRAM COMPLETE Result Date: 04/07/2023    ECHOCARDIOGRAM REPORT   Patient Name:   Isidro Hal Neer Date of Exam: 04/07/2023 Medical Rec #:  [id number]        Height:       72.0 in Accession #:    [id number]       Weight:       196.0 lb Date of Birth:  12-01-1955        BSA:          2.112 m Patient Age:    85 years         BP:            134/106 mmHg Patient Gender: M                HR:           71 bpm. Exam Location:  Inpatient Procedure: 2D Echo, Cardiac Doppler, Color Doppler and Intracardiac            Opacification Agent Indications:    I48.91* Unspeicified atrial fibrillation  History:        Patient has no prior history of Echocardiogram examinations. CHF                 and Cardiomyopathy, Arrythmias:Atrial Flutter; Risk                 Factors:Hypertension and Diabetes.  Sonographer:    Webb Laws Referring Phys: [id number] ARSHAD N KAKRAKANDY IMPRESSIONS  1. Left ventricular ejection fraction, by estimation, is 30 to 35%. The left ventricle has moderately decreased function. The left ventricle demonstrates global hypokinesis. There is mild left ventricular hypertrophy. Left ventricular diastolic parameters are indeterminate.  2. Right ventricular systolic function is moderately reduced. The right ventricular size is mildly enlarged. There is mildly elevated pulmonary artery systolic pressure. The estimated right ventricular systolic pressure is 44.5 mmHg.  3. Left atrial size was mildly dilated.  4. The mitral valve is normal in structure. Mild mitral valve regurgitation. No evidence of mitral stenosis.  5. The aortic valve is tricuspid. Aortic valve regurgitation is trivial. No aortic stenosis is present.  6. Aortic dilatation noted. There is dilatation of the ascending aorta, measuring 40 mm.  7. The inferior vena cava is dilated in size with >50% respiratory variability, suggesting right atrial pressure of 8 mmHg. FINDINGS  Left Ventricle: Left ventricular ejection fraction, by estimation, is 30 to 35%. The left ventricle has moderately decreased function. The left ventricle demonstrates global hypokinesis. Definity contrast agent was given IV to delineate the left ventricular endocardial borders. The left ventricular internal cavity size was normal in size. There is mild left ventricular hypertrophy. Left ventricular  diastolic parameters are indeterminate. Right Ventricle: The right ventricular size is mildly enlarged. No increase in  right ventricular wall thickness. Right ventricular systolic function is moderately reduced. There is mildly elevated pulmonary artery systolic pressure. The tricuspid regurgitant velocity is 3.02 m/s, and with an assumed right atrial pressure of 8 mmHg, the estimated right ventricular systolic pressure is 44.5 mmHg. Left Atrium: Left atrial size was mildly dilated. Right Atrium: Right atrial size was normal in size. Pericardium: There is no evidence of pericardial effusion. Mitral Valve: The mitral valve is normal in structure. Mild mitral valve regurgitation. No evidence of mitral valve stenosis. Tricuspid Valve: The tricuspid valve is normal in structure. Tricuspid valve regurgitation is mild. Aortic Valve: The aortic valve is tricuspid. Aortic valve regurgitation is trivial. No aortic stenosis is present. Pulmonic Valve: The pulmonic valve was not well visualized. Pulmonic valve regurgitation is trivial. Aorta: The aortic root is normal in size and structure and aortic dilatation noted. There is dilatation of the ascending aorta, measuring 40 mm. Venous: The inferior vena cava is dilated in size with greater than 50% respiratory variability, suggesting right atrial pressure of 8 mmHg. IAS/Shunts: The interatrial septum was not well visualized.  LEFT VENTRICLE PLAX 2D LVIDd:         5.70 cm      Diastology LVIDs:         4.80 cm      LV e' medial:    5.91 cm/s LV PW:         1.00 cm      LV E/e' medial:  19.8 LV IVS:        1.10 cm      LV e' lateral:   12.10 cm/s LVOT diam:     2.00 cm      LV E/e' lateral: 9.7 LV SV:         39 LV SV Index:   19 LVOT Area:     3.14 cm  LV Volumes (MOD) LV vol d, MOD A2C: 132.0 ml LV vol d, MOD A4C: 104.0 ml LV vol s, MOD A2C: 64.1 ml LV vol s, MOD A4C: 72.0 ml LV SV MOD A2C:     67.9 ml LV SV MOD A4C:     104.0 ml LV SV MOD BP:      48.3 ml RIGHT VENTRICLE             IVC RV Basal diam:  4.10 cm    IVC diam: 2.90 cm RV S prime:     8.70 cm/s TAPSE (M-mode): 1.9 cm LEFT ATRIUM             Index        RIGHT ATRIUM           Index LA diam:        4.50 cm 2.13 cm/m   RA Area:     19.45 cm LA Vol (A2C):   59.8 ml 28.31 ml/m  RA Volume:   50.80 ml  24.05 ml/m LA Vol (A4C):   94.8 ml 44.88 ml/m LA Biplane Vol: 77.4 ml 36.64 ml/m  AORTIC VALVE LVOT Vmax:   92.12 cm/s LVOT Vmean:  55.320 cm/s LVOT VTI:    0.125 m  AORTA Ao Root diam: 3.60 cm Ao Asc diam:  4.00 cm MITRAL VALVE                TRICUSPID VALVE MV Area (PHT): 5.97 cm     TR Peak grad:   36.5 mmHg MV Decel Time: 127 msec     TR Vmax:  302.00 cm/s MR Peak grad: 86.5 mmHg MR Mean grad: 59.0 mmHg     SHUNTS MR Vmax:      465.00 cm/s   Systemic VTI:  0.13 m MR Vmean:     361.0 cm/s    Systemic Diam: 2.00 cm MV E velocity: 117.00 cm/s Epifanio Lesches MD Electronically signed by Epifanio Lesches MD Signature Date/Time: 04/07/2023/11:50:24 AM    Final    DG Chest 2 View Result Date: 04/06/2023 CLINICAL DATA:  Dyspnea EXAM: CHEST - 2 VIEW COMPARISON:  07/14/2022 FINDINGS: The lungs are symmetrically well expanded. There is a perihilar interstitial thickening again seen in keeping with changes of mild interstitial pulmonary edema or airway inflammation. No confluent pulmonary infiltrate. No pneumothorax or pleural effusion. Cardiac size within normal limits. Pulmonary vascularity is normal. IMPRESSION: 1. Perihilar interstitial thickening in keeping with changes of mild interstitial pulmonary edema or airway inflammation. Electronically Signed   By: Helyn Numbers M.D.   On: 04/06/2023 23:46    Cardiac Studies   2d echo 04/07/23   1. Left ventricular ejection fraction, by estimation, is 30 to 35%. The  left ventricle has moderately decreased function. The left ventricle  demonstrates global hypokinesis. There is mild left ventricular  hypertrophy. Left ventricular diastolic  parameters are  indeterminate.   2. Right ventricular systolic function is moderately reduced. The right  ventricular size is mildly enlarged. There is mildly elevated pulmonary  artery systolic pressure. The estimated right ventricular systolic  pressure is 44.5 mmHg.   3. Left atrial size was mildly dilated.   4. The mitral valve is normal in structure. Mild mitral valve  regurgitation. No evidence of mitral stenosis.   5. The aortic valve is tricuspid. Aortic valve regurgitation is trivial.  No aortic stenosis is present.   6. Aortic dilatation noted. There is dilatation of the ascending aorta,  measuring 40 mm.   7. The inferior vena cava is dilated in size with >50% respiratory  variability, suggesting right atrial pressure of 8 mmHg.   Patient Profile     68 y.o. male with chronic HFrEF (no prior cath), ESRD on hemodialysis on Mon/Wed/Fri, failed renal transplant takes Prograf, prior nonadherence with medications/behavioral concerns, hypertension, hyperlipidemia, chronic anemia. Presented with worsening SOB/wheezing, recent mechanical fall, found to have atrial fib RVR by EMS report, here in Afib/flutter. Resp viral panel + RSV.  Assessment & Plan    1. New onset atrial fib/flutter - carvedilol 50mg  at bedtime and metoprolol 25mg  BID listed on med list, listed as not taking carvedilol ->  - TSH OK - BPs variable, folow - HR currently 70s-90s on metoprolol 25mg  BID, continue, anticipate transition to Toprol at DC for cardiomyopathy per notes  2. RSV infection with wheezing - care per primary team  3. Acute on chronic HFrEF - prior workup in [facility] -  EF last known to be normal 05/2022; echo 07/2022 EF 40% with nuc at that time showing mild inferolateral hypokinesia, fixed inferior wall defect, EF 51%, then repeat echo 10/2022 EF 35-40% with moderate-severe TR, mild-moderate MR - cath advised, patient preferred echo follow-up instead, lost to f/u after - 2d echo here EF 30-35%, mild LVH,  global HK, moderately reduced RV function, mild pHTN, mild MR, mild dilation of ascending aorta - Dr. Mayford Knife has recommended [facility] today to further definite coronaries/etiology of cardiomyopathy - patient remains agreeable; also agrees to compliance with meds if PCI performed  4. Elevated troponin level, extensive coronary calcification by prior CT - hsTroponins  low/flat [id number]  - currently on heparin for AF - LDL 45, acceptable, on atorvastatin 10mg  daily - for cath today as above  5. ESRD on HD  - volume management per nephrology  6. HTN  - manage in context above  7. Mild dilation of ascending aorta - by echo - OP f/u  For questions or updates, please contact [facility] Please consult [email or web address] for contact info under Cardiology/STEMI.  Signed, Laurann Montana, PA-C 04/08/2023, 8:32 AM

## 2023-04-08 NOTE — Progress Notes (Signed)
Heart Failure Navigator Progress Note  Assessed for Heart & Vascular TOC clinic readiness.  Patient does not meet criteria due to ESRD on hemodialysis.   Navigator will sign off at this time.    Dawn Fields, BSN, RN Heart Failure Nurse Navigator Secure Chat Only   

## 2023-04-08 NOTE — Progress Notes (Signed)
  [facility] Progress Note   Subjective: Seen in room. Waiting for dialysis transport. SOB this am - on room air. No chest pain, palpitations.   Objective Vitals:   04/08/23 0723 04/08/23 0827 04/08/23 0838 04/08/23 0936  BP: (!) 88/73 (!) 154/98 (!) 154/98 (!) 169/127  Pulse: 79 72  77  Resp:   (!) 28 18  Temp:   (!) 97.3 F (36.3 C) (!) 97.4 F (36.3 C)  TempSrc:   Oral   SpO2: 94%  98% 96%  Weight:    92.5 kg  Height:         Additional Objective Labs: Basic Metabolic Panel: Recent Labs  Lab 04/06/23 2113 04/07/23 0437 04/08/23 0657  NA 137 135 132*  K 3.5 3.2* 5.0  CL 98 99 96*  CO2 22 25 20*  GLUCOSE 103* 88 93  BUN 47* 45* 60*  CREATININE 7.86* 7.79* 9.23*  CALCIUM 8.3* 7.8* 8.3*   CBC: Recent Labs  Lab 04/06/23 2113 04/07/23 0437 04/08/23 0657  WBC 7.4 7.2 8.4  HGB 10.2* 9.5* 11.0*  HCT 30.6* 28.6* 33.0*  MCV 87.9 87.7 88.2  PLT 168 160 179   Blood Culture No results found for: "SDES", "SPECREQUEST", "CULT", "REPTSTATUS"   Physical Exam General: Alert, lying in bed, nad  Heart: Irregular  Lungs: Crackles at bases Abdomen: Protruding, non -tender Extremities: 1+ LE edema  Dialysis Access: L AVF +bruit   Medications:  sodium chloride 10 mL/hr at 04/08/23 1761   anticoagulant sodium citrate     heparin 1,750 Units/hr (04/08/23 0558)    aspirin EC  81 mg Oral Daily   atorvastatin  10 mg Oral Daily   calcium acetate  2,001 mg Oral TID WC   chlorpheniramine-HYDROcodone  5 mL Oral Q12H   doxazosin  4 mg Oral QHS   ferrous sulfate  325 mg Oral Q breakfast   heparin  2,500 Units Dialysis Once in dialysis   losartan  100 mg Oral Daily   magnesium oxide  400 mg Oral Daily   metoprolol tartrate  25 mg Oral BID   sodium bicarbonate  650 mg Oral BID   sucroferric oxyhydroxide  500 mg Oral q AM   tacrolimus  1 mg Oral Daily    Dialysis Orders:  [facility] MWF L AVF  EDW just increased 89.5 kg to 90.5 kg    Assessment/Plan: Acute hypoxic resp failure - Volume overload + RSV infection. Pulm edema on CXR. Gross vol overload on exam. Reaching OP dry weight. Likely losing body weight and needs EDW adjustment. For HD today.  A-fib/flutter w RVR - on admit. Rate controlled this am. On metoprolol. Per cardiology - for cardiac cath today.  ESRD - on HD MWF. Has not missed HD. HD today.  HTN - BP's up a bit here. Cont home meds, get vol down w/ HD.  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 Follow.   Secondary hyperparathyroidism - CCa in range, add on phos. Cont binders w/ meals.  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   Tomasa Blase PA-C [facility] 04/08/2023,9:52 AM

## 2023-04-08 NOTE — Progress Notes (Signed)
Brief same day note:    PROGRESS NOTE  [name]  ZOX:[id number] DOB: Jun 10, 1955 DOA: 04/06/2023 PCP: Judd Lien, PA-C   Brief Narrative: Patient is a 68 year old male with history of ESRD on dialysis on Monday, Wednesday, Friday, failed renal transplant, hypertension, hyperlipidemia, chronic normocytic anemia who presented here with complaint of shortness of breath.  On presentation, he was found to be in A-fib with RVR with heart rate in the range of 110-120.  Chest x-ray showed possible pulmonary edema, BNP elevated.  Started on heparin drip.  Cardiology consulted.  Echo showed diminished EF of 30 to 35%, global hypokinesis.  Cardiology planning for right/left heart cath today  Assessment & Plan:  Principal Problem:   Atrial flutter with rapid ventricular response (HCC) Active Problems:   History of renal transplant   Hypertension   Anemia   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   Type 2 diabetes mellitus with complications (HCC)   End stage renal disease (HCC)   RSV (respiratory syncytial virus pneumonia)   Acute pulmonary edema (HCC)   Atrial fibrillation with RVR (HCC)   Acute on chronic systolic CHF (congestive heart failure) (HCC)   Coronary artery calcification seen on CAT scan   Pure hypercholesterolemia   Pulmonary hypertension, unspecified (HCC)    New onset A-fib with RVR: No history of A-fib.CHADS2 Vasc score of  least 2 .  Cardiology consulted.  Started on heparin drip.    On metoprolol  HFrEF: Echo 5 years ago had shown normal EF.  Echo done here showed EF of 30 to 35 %, global hypokinesis .  Cardiology consulted.  Plan for left/right heart cath.  LDL of 45.  On Toprol, Lipitor, aspirin, ARB  Elevated BNP: Likely from volume overload.  Volume management as per dialysis.  2D echo as above  RSV infection: Has some dry cough.  Ordered cough medication.  Lungs are mostly clear on auscultation.  On room air  Elevated troponin:  Likely from demand ischemia/chf.  No chest pain.  Echo as above  ESRD on dialysis: Dialyzed on Monday, Wednesday, Friday.  Nephrology consulted.  Plan for dialysis today  Normocytic anemia: Likely from CKD  Hypertension: On losartan, metoprolol, Cardura  Hyperlipidemia: Statin  Hypokalemia: Supplemented with potassium and corrected  Debility/weakness: Will consult PT after cardiac workup.  Lives in an apartment by himself        DVT prophylaxis:IV heparin     Code Status: Full Code  Family Communication: None at the bedside  Patient status: Inpatient  Patient is from :home  Anticipated discharge WJ:XBJY  Estimated DC date:2-3 days, after full cardiac workup   Consultants: Cardiology, nephrology  Procedures: None yet  Antimicrobials:  Anti-infectives (From admission, onward)    None       Subjective: Patient seen and examined the bedside today.  He has a dry cough.  Denies any worsening shortness of breath.  Says he does not feel good.  Does not appear to be in chest.  Denies chest pain.  EKG monitor showed controlled heart rate with A-fib rhythm.  On room air.  Hemodynamically stable.  Objective: Vitals:   04/08/23 0936 04/08/23 1000 04/08/23 1100 04/08/23 1130  BP: (!) 169/127 (!) 147/56 106/83 (!) 143/95  Pulse: 77 76 78 74  Resp: 18 (!) 22 17 18   Temp: (!) 97.4 F (36.3 C)     TempSrc:      SpO2: 96% 97% 100% 100%  Weight: 92.5 kg  Height:        Intake/Output Summary (Last 24 hours) at 04/08/2023 1149 Last data filed at 04/07/2023 1749 Gross per 24 hour  Intake 360 ml  Output --  Net 360 ml   Filed Weights   04/07/23 1847 04/08/23 0500 04/08/23 0936  Weight: 92.9 kg 92.5 kg 92.5 kg    Examination:  General exam: Overall comfortable, not in distress, appears weak HEENT: PERRL Respiratory system:  no wheezes or crackles  Cardiovascular system: Irregularly irregular rhythm Gastrointestinal system: Abdomen is nondistended, soft and  nontender. Central nervous system: Alert and oriented Extremities: Trace edema of the lateral lower extremities edema, no clubbing ,no cyanosis, left upper extremity AV fistula Skin: No rashes, no ulcers,no icterus     Data Reviewed: I have personally reviewed following labs and imaging studies  CBC: Recent Labs  Lab 04/06/23 2113 04/07/23 0437 04/08/23 0657  WBC 7.4 7.2 8.4  HGB 10.2* 9.5* 11.0*  HCT 30.6* 28.6* 33.0*  MCV 87.9 87.7 88.2  PLT 168 160 179   Basic Metabolic Panel: Recent Labs  Lab 04/06/23 2113 04/07/23 0437 04/08/23 0657  NA 137 135 132*  K 3.5 3.2* 5.0  CL 98 99 96*  CO2 22 25 20*  GLUCOSE 103* 88 93  BUN 47* 45* 60*  CREATININE 7.86* 7.79* 9.23*  CALCIUM 8.3* 7.8* 8.3*     Recent Results (from the past 240 hours)  Resp panel by RT-PCR (RSV, Flu A&B, Covid) Anterior Nasal Swab     Status: Abnormal   Collection Time: 04/06/23 11:43 PM   Specimen: Anterior Nasal Swab  Result Value Ref Range Status   SARS Coronavirus 2 by RT PCR NEGATIVE NEGATIVE Final   Influenza A by PCR NEGATIVE NEGATIVE Final   Influenza B by PCR NEGATIVE NEGATIVE Final    Comment: (NOTE) The Xpert Xpress SARS-CoV-2/FLU/RSV plus assay is intended as an aid in the diagnosis of influenza from Nasopharyngeal swab specimens and should not be used as a sole basis for treatment. Nasal washings and aspirates are unacceptable for Xpert Xpress SARS-CoV-2/FLU/RSV testing.  Fact Sheet for Patients: BloggerCourse.com  Fact Sheet for Healthcare Providers: SeriousBroker.it  This test is not yet approved or cleared by the [place] FDA and has been authorized for detection and/or diagnosis of SARS-CoV-2 by FDA under an Emergency Use Authorization (EUA). This EUA will remain in effect (meaning this test can be used) for the duration of the COVID-19 declaration under Section 564(b)(1) of the Act, 21 U.S.C. section 360bbb-3(b)(1),  unless the authorization is terminated or revoked.     Resp Syncytial Virus by PCR POSITIVE (A) NEGATIVE Final    Comment: (NOTE) Fact Sheet for Patients: BloggerCourse.com  Fact Sheet for Healthcare Providers: SeriousBroker.it  This test is not yet approved or cleared by the [place] FDA and has been authorized for detection and/or diagnosis of SARS-CoV-2 by FDA under an Emergency Use Authorization (EUA). This EUA will remain in effect (meaning this test can be used) for the duration of the COVID-19 declaration under Section 564(b)(1) of the Act, 21 U.S.C. section 360bbb-3(b)(1), unless the authorization is terminated or revoked.  Performed at [facility], [address], [place], [place] [address]      Radiology Studies: ECHOCARDIOGRAM COMPLETE Result Date: 04/07/2023    ECHOCARDIOGRAM REPORT   Patient Name:   Jacob Daniel Date of Exam: 04/07/2023 Medical Rec #:  [id number]        Height:       72.0  in Accession #:    [id number]       Weight:       196.0 lb Date of Birth:  [birth year]        BSA:          2.112 m Patient Age:    4 years         BP:           134/106 mmHg Patient Gender: M                HR:           71 bpm. Exam Location:  Inpatient Procedure: 2D Echo, Cardiac Doppler, Color Doppler and Intracardiac            Opacification Agent Indications:    I48.91* Unspeicified atrial fibrillation  History:        Patient has no prior history of Echocardiogram examinations. CHF                 and Cardiomyopathy, Arrythmias:Atrial Flutter; Risk                 Factors:Hypertension and Diabetes.  Sonographer:    Webb Laws Referring Phys: [id number] ARSHAD N KAKRAKANDY IMPRESSIONS  1. Left ventricular ejection fraction, by estimation, is 30 to 35%. The left ventricle has moderately decreased function. The left ventricle demonstrates global hypokinesis. There is mild left ventricular hypertrophy. Left ventricular  diastolic parameters are indeterminate.  2. Right ventricular systolic function is moderately reduced. The right ventricular size is mildly enlarged. There is mildly elevated pulmonary artery systolic pressure. The estimated right ventricular systolic pressure is 44.5 mmHg.  3. Left atrial size was mildly dilated.  4. The mitral valve is normal in structure. Mild mitral valve regurgitation. No evidence of mitral stenosis.  5. The aortic valve is tricuspid. Aortic valve regurgitation is trivial. No aortic stenosis is present.  6. Aortic dilatation noted. There is dilatation of the ascending aorta, measuring 40 mm.  7. The inferior vena cava is dilated in size with >50% respiratory variability, suggesting right atrial pressure of 8 mmHg. FINDINGS  Left Ventricle: Left ventricular ejection fraction, by estimation, is 30 to 35%. The left ventricle has moderately decreased function. The left ventricle demonstrates global hypokinesis. Definity contrast agent was given IV to delineate the left ventricular endocardial borders. The left ventricular internal cavity size was normal in size. There is mild left ventricular hypertrophy. Left ventricular diastolic parameters are indeterminate. Right Ventricle: The right ventricular size is mildly enlarged. No increase in right ventricular wall thickness. Right ventricular systolic function is moderately reduced. There is mildly elevated pulmonary artery systolic pressure. The tricuspid regurgitant velocity is 3.02 m/s, and with an assumed right atrial pressure of 8 mmHg, the estimated right ventricular systolic pressure is 44.5 mmHg. Left Atrium: Left atrial size was mildly dilated. Right Atrium: Right atrial size was normal in size. Pericardium: There is no evidence of pericardial effusion. Mitral Valve: The mitral valve is normal in structure. Mild mitral valve regurgitation. No evidence of mitral valve stenosis. Tricuspid Valve: The tricuspid valve is normal in structure.  Tricuspid valve regurgitation is mild. Aortic Valve: The aortic valve is tricuspid. Aortic valve regurgitation is trivial. No aortic stenosis is present. Pulmonic Valve: The pulmonic valve was not well visualized. Pulmonic valve regurgitation is trivial. Aorta: The aortic root is normal in size and structure and aortic dilatation noted. There is dilatation of the ascending aorta, measuring 40 mm. Venous: The inferior vena cava is  dilated in size with greater than 50% respiratory variability, suggesting right atrial pressure of 8 mmHg. IAS/Shunts: The interatrial septum was not well visualized.  LEFT VENTRICLE PLAX 2D LVIDd:         5.70 cm      Diastology LVIDs:         4.80 cm      LV e' medial:    5.91 cm/s LV PW:         1.00 cm      LV E/e' medial:  19.8 LV IVS:        1.10 cm      LV e' lateral:   12.10 cm/s LVOT diam:     2.00 cm      LV E/e' lateral: 9.7 LV SV:         39 LV SV Index:   19 LVOT Area:     3.14 cm  LV Volumes (MOD) LV vol d, MOD A2C: 132.0 ml LV vol d, MOD A4C: 104.0 ml LV vol s, MOD A2C: 64.1 ml LV vol s, MOD A4C: 72.0 ml LV SV MOD A2C:     67.9 ml LV SV MOD A4C:     104.0 ml LV SV MOD BP:      48.3 ml RIGHT VENTRICLE            IVC RV Basal diam:  4.10 cm    IVC diam: 2.90 cm RV S prime:     8.70 cm/s TAPSE (M-mode): 1.9 cm LEFT ATRIUM             Index        RIGHT ATRIUM           Index LA diam:        4.50 cm 2.13 cm/m   RA Area:     19.45 cm LA Vol (A2C):   59.8 ml 28.31 ml/m  RA Volume:   50.80 ml  24.05 ml/m LA Vol (A4C):   94.8 ml 44.88 ml/m LA Biplane Vol: 77.4 ml 36.64 ml/m  AORTIC VALVE LVOT Vmax:   92.12 cm/s LVOT Vmean:  55.320 cm/s LVOT VTI:    0.125 m  AORTA Ao Root diam: 3.60 cm Ao Asc diam:  4.00 cm MITRAL VALVE                TRICUSPID VALVE MV Area (PHT): 5.97 cm     TR Peak grad:   36.5 mmHg MV Decel Time: 127 msec     TR Vmax:        302.00 cm/s MR Peak grad: 86.5 mmHg MR Mean grad: 59.0 mmHg     SHUNTS MR Vmax:      465.00 cm/s   Systemic VTI:  0.13 m MR Vmean:      361.0 cm/s    Systemic Diam: 2.00 cm MV E velocity: 117.00 cm/s Epifanio Lesches MD Electronically signed by Epifanio Lesches MD Signature Date/Time: 04/07/2023/11:50:24 AM    Final    DG Chest 2 View Result Date: 04/06/2023 CLINICAL DATA:  Dyspnea EXAM: CHEST - 2 VIEW COMPARISON:  07/14/2022 FINDINGS: The lungs are symmetrically well expanded. There is a perihilar interstitial thickening again seen in keeping with changes of mild interstitial pulmonary edema or airway inflammation. No confluent pulmonary infiltrate. No pneumothorax or pleural effusion. Cardiac size within normal limits. Pulmonary vascularity is normal. IMPRESSION: 1. Perihilar interstitial thickening in keeping with changes of mild interstitial pulmonary edema or airway inflammation. Electronically Signed   By:  Helyn Numbers M.D.   On: 04/06/2023 23:46    Scheduled Meds:  aspirin EC  81 mg Oral Daily   atorvastatin  10 mg Oral Daily   calcium acetate  2,001 mg Oral TID WC   chlorpheniramine-HYDROcodone  5 mL Oral Q12H   doxazosin  4 mg Oral QHS   ferrous sulfate  325 mg Oral Q breakfast   heparin  2,500 Units Dialysis Once in dialysis   [START ON 04/09/2023] irbesartan  300 mg Oral Daily   magnesium oxide  400 mg Oral Daily   [START ON 04/09/2023] metoprolol succinate  50 mg Oral Daily   metoprolol tartrate  25 mg Oral BID   sodium bicarbonate  650 mg Oral BID   sucroferric oxyhydroxide  500 mg Oral q AM   tacrolimus  1 mg Oral Daily   Continuous Infusions:  sodium chloride 10 mL/hr at 04/08/23 0834   anticoagulant sodium citrate     heparin 1,750 Units/hr (04/08/23 0558)     LOS: 1 day   Burnadette Pop, MD [facility] P1/23/2025, 11:49 AM

## 2023-04-08 NOTE — Interval H&P Note (Signed)
History and Physical Interval Note:  04/08/2023 4:47 PM  Jacob Daniel  has presented today for surgery, with the diagnosis of heart failure.  The various methods of treatment have been discussed with the patient and family. After consideration of risks, benefits and other options for treatment, the patient has consented to  Procedure(s): RIGHT/LEFT HEART CATH AND CORONARY ANGIOGRAPHY (N/A) possible coronary angioplasty as a surgical intervention.  The patient's history has been reviewed, patient examined, no change in status, stable for surgery.  I have reviewed the patient's chart and labs.  Questions were answered to the patient's satisfaction.     Yates Decamp

## 2023-04-09 ENCOUNTER — Encounter (HOSPITAL_COMMUNITY): Payer: Self-pay | Admitting: Cardiology

## 2023-04-09 ENCOUNTER — Telehealth (HOSPITAL_COMMUNITY): Payer: Self-pay | Admitting: Pharmacy Technician

## 2023-04-09 ENCOUNTER — Inpatient Hospital Stay (HOSPITAL_COMMUNITY): Payer: 59

## 2023-04-09 ENCOUNTER — Other Ambulatory Visit (HOSPITAL_COMMUNITY): Payer: Self-pay

## 2023-04-09 ENCOUNTER — Telehealth: Payer: Self-pay | Admitting: Physician Assistant

## 2023-04-09 DIAGNOSIS — I5023 Acute on chronic systolic (congestive) heart failure: Secondary | ICD-10-CM | POA: Diagnosis not present

## 2023-04-09 DIAGNOSIS — I4892 Unspecified atrial flutter: Secondary | ICD-10-CM | POA: Diagnosis not present

## 2023-04-09 DIAGNOSIS — R9439 Abnormal result of other cardiovascular function study: Secondary | ICD-10-CM | POA: Diagnosis not present

## 2023-04-09 DIAGNOSIS — I420 Dilated cardiomyopathy: Secondary | ICD-10-CM | POA: Diagnosis not present

## 2023-04-09 LAB — CBC
HCT: 29.5 % — ABNORMAL LOW (ref 39.0–52.0)
Hemoglobin: 9.8 g/dL — ABNORMAL LOW (ref 13.0–17.0)
MCH: 29 pg (ref 26.0–34.0)
MCHC: 33.2 g/dL (ref 30.0–36.0)
MCV: 87.3 fL (ref 80.0–100.0)
Platelets: 162 10*3/uL (ref 150–400)
RBC: 3.38 MIL/uL — ABNORMAL LOW (ref 4.22–5.81)
RDW: 18 % — ABNORMAL HIGH (ref 11.5–15.5)
WBC: 6.5 10*3/uL (ref 4.0–10.5)
nRBC: 0.6 % — ABNORMAL HIGH (ref 0.0–0.2)

## 2023-04-09 LAB — BASIC METABOLIC PANEL
Anion gap: 16 — ABNORMAL HIGH (ref 5–15)
BUN: 47 mg/dL — ABNORMAL HIGH (ref 8–23)
CO2: 25 mmol/L (ref 22–32)
Calcium: 8.4 mg/dL — ABNORMAL LOW (ref 8.9–10.3)
Chloride: 94 mmol/L — ABNORMAL LOW (ref 98–111)
Creatinine, Ser: 7.26 mg/dL — ABNORMAL HIGH (ref 0.61–1.24)
GFR, Estimated: 8 mL/min — ABNORMAL LOW (ref >60)
Glucose, Bld: 77 mg/dL (ref 70–99)
Potassium: 4.1 mmol/L (ref 3.5–5.1)
Sodium: 135 mmol/L (ref 135–145)

## 2023-04-09 LAB — HEPATITIS B SURFACE ANTIBODY, QUANTITATIVE: Hep B S AB Quant (Post): 2567 m[IU]/mL

## 2023-04-09 MED ORDER — APIXABAN 5 MG PO TABS
5.0000 mg | ORAL_TABLET | Freq: Two times a day (BID) | ORAL | Status: DC
  Administered 2023-04-09 – 2023-04-10 (×3): 5 mg via ORAL
  Filled 2023-04-09 (×3): qty 1

## 2023-04-09 MED ORDER — METOPROLOL SUCCINATE ER 50 MG PO TB24
75.0000 mg | ORAL_TABLET | Freq: Every day | ORAL | Status: DC
  Filled 2023-04-09: qty 1

## 2023-04-09 MED ORDER — CHLORHEXIDINE GLUCONATE CLOTH 2 % EX PADS
6.0000 | MEDICATED_PAD | Freq: Every day | CUTANEOUS | Status: DC
  Administered 2023-04-09 – 2023-04-10 (×2): 6 via TOPICAL

## 2023-04-09 MED ORDER — METOPROLOL SUCCINATE ER 25 MG PO TB24
25.0000 mg | ORAL_TABLET | Freq: Once | ORAL | Status: AC
  Administered 2023-04-09: 25 mg via ORAL
  Filled 2023-04-09: qty 1

## 2023-04-09 NOTE — Telephone Encounter (Signed)
2/10 @ 320 with Dr Elwyn Lade

## 2023-04-09 NOTE — Progress Notes (Addendum)
Received patient in bed to unit.  Alert and oriented.  Informed consent signed and in chart.   TX duration:3 hours  Patient tolerated well.  Transported back to the room  Alert, without acute distress.  Hand-off given to patient's nurse.   Access used: Left Upper Arm Fistula Access issues: none  Total UF removed: 2.5L Medication(s) given: none   Stacie Glaze LPN [facility]

## 2023-04-09 NOTE — Progress Notes (Signed)
PHARMACY - ANTICOAGULATION CONSULT NOTE  Pharmacy Consult for heparin> apixaban Indication: atrial fibrillation  No Known Allergies  Patient Measurements: Height: 6' (182.9 cm) Weight: 89 kg (196 lb 3.4 oz) IBW/kg (Calculated) : 77.6  Vital Signs: Temp: 98.3 F (36.8 C) (01/24 0517) Temp Source: Oral (01/24 0517) BP: 196/76 (01/24 0519) Pulse Rate: 65 (01/24 0519)  Labs: Recent Labs    04/07/23 0053 04/07/23 0304 04/07/23 0437 04/07/23 1023 04/07/23 2053 04/08/23 0657 04/08/23 1542 04/08/23 1703 04/08/23 1707 04/09/23 0439  HGB  --   --  9.5*  --   --  11.0*  --  12.2* 12.2*  11.9* 9.8*  HCT  --   --  28.6*  --   --  33.0*  --  36.0* 36.0*  35.0* 29.5*  PLT  --   --  160  --   --  179  --   --   --  162  HEPARINUNFRC  --   --   --  0.20* 0.19*  --  0.45  --   --   --   CREATININE  --   --  7.79*  --   --  9.23*  --   --   --  7.26*  TROPONINIHS 107* 109* 124*  --   --   --   --   --   --   --     Estimated Creatinine Clearance: 10.8 mL/min (A) (by C-G formula based on SCr of 7.26 mg/dL (H)).   Medical History: Past Medical History:  Diagnosis Date   Arthritis    CHF (congestive heart failure) (HCC)    Diabetes mellitus without complication (HCC)    ESRD on hemodialysis (HCC)    Hypertension     Assessment: 68yo male c/o SOB worse than baseline, found to be in Afib. Pharmacy dosing heparin and to change to apixaban. He is noted s/p cath 1/23 with severe CAD and pulmonary hypertension. He is also noted with ESRD  -Wt= 89kg -Hg= 9.8 (baseline ~8.5-10) -cost of apixaban= $0   Goal of Therapy:  Heparin level 0.3-0.7 units/ml Monitor platelets by anticoagulation protocol: Yes   Plan:  -D/c heparin -apixaban 5mg  po bid  Harland German, PharmD Clinical Pharmacist **Pharmacist phone directory can now be found on [email or web address] ([email or web address]).  Listed under [facility].

## 2023-04-09 NOTE — Progress Notes (Signed)
   04/09/23 1710  Vitals  Temp (!) 97.5 F (36.4 C)  Temp Source Oral  BP 134/81  MAP (mmHg) 94  Pulse Rate (!) 57  ECG Heart Rate 61  Resp 19  Oxygen Therapy  SpO2 100 %  O2 Device Room Air  During Treatment Monitoring  HD Safety Checks Performed Yes  Intra-Hemodialysis Comments Tx completed;Tolerated well  Dialysis Fluid Bolus Normal Saline  Bolus Amount (mL) 300 mL  Post Treatment  Dialyzer Clearance Lightly streaked  Liters Processed 68.4  Fluid Removed (mL) 2500 mL  Tolerated HD Treatment Yes  AVG/AVF Arterial Site Held (minutes) 7 minutes  AVG/AVF Venous Site Held (minutes) 7 minutes  Fistula / Graft Left Upper arm  Placement Date/Time: 04/07/23 1900   Orientation: Left  Access Location: Upper arm  Status Deaccessed

## 2023-04-09 NOTE — Progress Notes (Signed)
[facility] night cross cover note:   I was notified by RN of the patient's most recent blood pressure of 183/94, with corresponding heart rates in the 60s.  Asymptomatic at this time. Will give his irbesartan 300 mg PO qdaily, which is scheduled to occur at 10 AM this morning, early.  Specifically will give this dose of medication now.   Newton Pigg, DO Hospitalist

## 2023-04-09 NOTE — Discharge Instructions (Signed)

## 2023-04-09 NOTE — Telephone Encounter (Signed)
Hi CHF team, Dr. Mayford Knife recommends this patient be referred to CHF clinic at discharge for severe biventricular heart failure, can you help [place] get an appointment? Date of DC to be determined. I also plan to see the patient back in gen cards clinic 2/6 (note to self will arrange [place] at that visit). Thank you!

## 2023-04-09 NOTE — Progress Notes (Signed)
[facility] Progress Note   Subjective:   Reports he is feeling "100% better" today. Denies SOB, CP, dizziness, nausea. BP elevated today, no HA/blurry vision. He reports he usually has high BP pre-HD  Objective Vitals:   04/08/23 2000 04/09/23 0015 04/09/23 0517 04/09/23 0519  BP: (!) 139/90 133/88 (!) 183/94 (!) 196/76  Pulse: 71 69 65 65  Resp: 20 16 18    Temp: 97.9 F (36.6 C) 98 F (36.7 C) 98.3 F (36.8 C)   TempSrc: Oral Oral Oral   SpO2: 92% 94% 96% 93%  Weight:      Height:       Physical Exam General: Alert male in NAD Heart: RRR, no murmurs, rubs or gallops Lungs: CTA bilaterally, respirations unlabored on RA Abdomen: Soft, non-distended, +BS Extremities: trace edema b/l lower extremities Dialysis Access:  LUE AVF + T/b  Additional Objective Labs: Basic Metabolic Panel: Recent Labs  Lab 04/07/23 0437 04/08/23 0657 04/08/23 1703 04/08/23 1707 04/09/23 0439  NA 135 132* 135 135  135 135  K 3.2* 5.0 3.9 3.9  3.9 4.1  CL 99 96*  --   --  94*  CO2 25 20*  --   --  25  GLUCOSE 88 93  --   --  77  BUN 45* 60*  --   --  47*  CREATININE 7.79* 9.23*  --   --  7.26*  CALCIUM 7.8* 8.3*  --   --  8.4*   Liver Function Tests: Recent Labs  Lab 04/07/23 0437  AST 21  ALT 20  ALKPHOS 63  BILITOT 1.8*  PROT 7.2  ALBUMIN 2.5*   No results for input(s): "LIPASE", "AMYLASE" in the last 168 hours. CBC: Recent Labs  Lab 04/06/23 2113 04/07/23 0437 04/08/23 0657 04/08/23 1703 04/08/23 1707 04/09/23 0439  WBC 7.4 7.2 8.4  --   --  6.5  HGB 10.2* 9.5* 11.0* 12.2* 12.2*  11.9* 9.8*  HCT 30.6* 28.6* 33.0* 36.0* 36.0*  35.0* 29.5*  MCV 87.9 87.7 88.2  --   --  87.3  PLT 168 160 179  --   --  162   Blood Culture No results found for: "SDES", "SPECREQUEST", "CULT", "REPTSTATUS"  Cardiac Enzymes: No results for input(s): "CKTOTAL", "CKMB", "CKMBINDEX", "TROPONINI" in the last 168 hours. CBG: No results for input(s): "GLUCAP" in the last  168 hours. Iron Studies: No results for input(s): "IRON", "TIBC", "TRANSFERRIN", "FERRITIN" in the last 72 hours. @[email or web address] @ Studies/Results: CARDIAC CATHETERIZATION Result Date: 04/08/2023 Images from the original result were not included. Right & Left Heart Catheterization 04/08/23: Hemodynamic data: RA 32/36, mean 29 mmHg RV 67/10, EDP 25 mmHg PA 72/40, mean 46 mmHg.  PA saturation 63%. PW 45/49, mean 42 mmHg. QP/QS 1.0.  CO 5.37, CI 2.54, normal. PVR 0.75, PAPi 1.1, severely reduced suggestive of RV dysfunction. LVEDP 28 mmHg.  No pressure gradient across the aortic valve. Angiographic data: LM: Large-caliber vessel, calcified but normal. LAD: Large-caliber vessel, severely calcified with moderate to diffuse atherosclerosis, apical LAD has calcific 90% stenosis.  D1 is small to moderate-sized again with diffuse disease. LCx: Large-caliber vessel giving origin to large OM1 and a small OM 2, again diffuse calcification is evident. RCA: Very large caliber vessel, there is a proximal 30% and a mid 30% stenosis again severe diffuse calcification is evident. Impression and recommendations: Right heart catheterization suggestive of severe pulmonary hypertension with severely elevated LVEDP, preserved cardiac output and index, markedly reduced PAPi suggests severe RV dysfunction.  Severe diffuse coronary calcification.  No proximal vessel significant disease. Findings consistent with nonischemic cardiomyopathy.   ECHOCARDIOGRAM COMPLETE Result Date: 04/07/2023    ECHOCARDIOGRAM REPORT   Patient Name:   Deniro Hal Neer Date of Exam: 04/07/2023 Medical Rec #:  [id number]        Height:       72.0 in Accession #:    [id number]       Weight:       196.0 lb Date of Birth:  14-Feb-1956        BSA:          2.112 m Patient Age:    68 years         BP:           134/106 mmHg Patient Gender: M                HR:           71 bpm. Exam Location:  Inpatient Procedure: 2D Echo, Cardiac Doppler, Color Doppler and  Intracardiac            Opacification Agent Indications:    I48.91* Unspeicified atrial fibrillation  History:        Patient has no prior history of Echocardiogram examinations. CHF                 and Cardiomyopathy, Arrythmias:Atrial Flutter; Risk                 Factors:Hypertension and Diabetes.  Sonographer:    Webb Laws Referring Phys: [id number] ARSHAD N KAKRAKANDY IMPRESSIONS  1. Left ventricular ejection fraction, by estimation, is 30 to 35%. The left ventricle has moderately decreased function. The left ventricle demonstrates global hypokinesis. There is mild left ventricular hypertrophy. Left ventricular diastolic parameters are indeterminate.  2. Right ventricular systolic function is moderately reduced. The right ventricular size is mildly enlarged. There is mildly elevated pulmonary artery systolic pressure. The estimated right ventricular systolic pressure is 44.5 mmHg.  3. Left atrial size was mildly dilated.  4. The mitral valve is normal in structure. Mild mitral valve regurgitation. No evidence of mitral stenosis.  5. The aortic valve is tricuspid. Aortic valve regurgitation is trivial. No aortic stenosis is present.  6. Aortic dilatation noted. There is dilatation of the ascending aorta, measuring 40 mm.  7. The inferior vena cava is dilated in size with >50% respiratory variability, suggesting right atrial pressure of 8 mmHg. FINDINGS  Left Ventricle: Left ventricular ejection fraction, by estimation, is 30 to 35%. The left ventricle has moderately decreased function. The left ventricle demonstrates global hypokinesis. Definity contrast agent was given IV to delineate the left ventricular endocardial borders. The left ventricular internal cavity size was normal in size. There is mild left ventricular hypertrophy. Left ventricular diastolic parameters are indeterminate. Right Ventricle: The right ventricular size is mildly enlarged. No increase in right ventricular wall thickness. Right  ventricular systolic function is moderately reduced. There is mildly elevated pulmonary artery systolic pressure. The tricuspid regurgitant velocity is 3.02 m/s, and with an assumed right atrial pressure of 8 mmHg, the estimated right ventricular systolic pressure is 44.5 mmHg. Left Atrium: Left atrial size was mildly dilated. Right Atrium: Right atrial size was normal in size. Pericardium: There is no evidence of pericardial effusion. Mitral Valve: The mitral valve is normal in structure. Mild mitral valve regurgitation. No evidence of mitral valve stenosis. Tricuspid Valve: The tricuspid valve is normal in structure. Tricuspid valve regurgitation is mild. Aortic  Valve: The aortic valve is tricuspid. Aortic valve regurgitation is trivial. No aortic stenosis is present. Pulmonic Valve: The pulmonic valve was not well visualized. Pulmonic valve regurgitation is trivial. Aorta: The aortic root is normal in size and structure and aortic dilatation noted. There is dilatation of the ascending aorta, measuring 40 mm. Venous: The inferior vena cava is dilated in size with greater than 50% respiratory variability, suggesting right atrial pressure of 8 mmHg. IAS/Shunts: The interatrial septum was not well visualized.  LEFT VENTRICLE PLAX 2D LVIDd:         5.70 cm      Diastology LVIDs:         4.80 cm      LV e' medial:    5.91 cm/s LV PW:         1.00 cm      LV E/e' medial:  19.8 LV IVS:        1.10 cm      LV e' lateral:   12.10 cm/s LVOT diam:     2.00 cm      LV E/e' lateral: 9.7 LV SV:         39 LV SV Index:   19 LVOT Area:     3.14 cm  LV Volumes (MOD) LV vol d, MOD A2C: 132.0 ml LV vol d, MOD A4C: 104.0 ml LV vol s, MOD A2C: 64.1 ml LV vol s, MOD A4C: 72.0 ml LV SV MOD A2C:     67.9 ml LV SV MOD A4C:     104.0 ml LV SV MOD BP:      48.3 ml RIGHT VENTRICLE            IVC RV Basal diam:  4.10 cm    IVC diam: 2.90 cm RV S prime:     8.70 cm/s TAPSE (M-mode): 1.9 cm LEFT ATRIUM             Index        RIGHT ATRIUM            Index LA diam:        4.50 cm 2.13 cm/m   RA Area:     19.45 cm LA Vol (A2C):   59.8 ml 28.31 ml/m  RA Volume:   50.80 ml  24.05 ml/m LA Vol (A4C):   94.8 ml 44.88 ml/m LA Biplane Vol: 77.4 ml 36.64 ml/m  AORTIC VALVE LVOT Vmax:   92.12 cm/s LVOT Vmean:  55.320 cm/s LVOT VTI:    0.125 m  AORTA Ao Root diam: 3.60 cm Ao Asc diam:  4.00 cm MITRAL VALVE                TRICUSPID VALVE MV Area (PHT): 5.97 cm     TR Peak grad:   36.5 mmHg MV Decel Time: 127 msec     TR Vmax:        302.00 cm/s MR Peak grad: 86.5 mmHg MR Mean grad: 59.0 mmHg     SHUNTS MR Vmax:      465.00 cm/s   Systemic VTI:  0.13 m MR Vmean:     361.0 cm/s    Systemic Diam: 2.00 cm MV E velocity: 117.00 cm/s Epifanio Lesches MD Electronically signed by Epifanio Lesches MD Signature Date/Time: 04/07/2023/11:50:24 AM    Final    Medications:  sodium chloride     heparin 1,750 Units/hr (04/09/23 0455)    aspirin EC  81 mg Oral Daily   atorvastatin  10 mg Oral Daily   calcium acetate  2,001 mg Oral TID WC   chlorpheniramine-HYDROcodone  5 mL Oral Q12H   doxazosin  4 mg Oral QHS   ferrous sulfate  325 mg Oral Q breakfast   irbesartan  300 mg Oral Daily   magnesium oxide  400 mg Oral Daily   metoprolol succinate  50 mg Oral Daily   metoprolol tartrate  25 mg Oral BID   sodium bicarbonate  650 mg Oral BID   sucroferric oxyhydroxide  500 mg Oral q AM   tacrolimus  1 mg Oral Daily    Dialysis Orders:  [facility] MWF L AVF  EDW just increased 89.5 kg to 90.5 kg   Assessment/Plan: Acute hypoxic resp failure - Volume overload + RSV infection. Pulm edema on CXR. Gross vol overload on exam. Reaching OP dry weight. Likely losing body weight and needs EDW adjustment. Last post HD weight 89kg A-fib/flutter w RVR - on admit. Rate controlled this am. On metoprolol. Per cardiology ESRD - on HD MWF. Off schedule with HD yesterday due to ED visit (otherwise has not missed HD). Resume MWF schedule with HD today. Lower EDW  further as tolerated.  HTN - BP's elevated today, given AM dose of antihypertensives early. 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 Follow.   Secondary hyperparathyroidism - CCa in range, add on phos. Cont binders w/ meals.  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     Rogers Blocker, PA-C 04/09/2023, 8:37 AM  [facility] Pager: [phone]

## 2023-04-09 NOTE — Telephone Encounter (Signed)
Patient Product/process development scientist completed.    The patient is insured through [facility]. Patient has Medicare and is not eligible for a copay card, but may be able to apply for patient assistance or Medicare RX Payment Plan (Patient Must reach out to their plan, if eligible for payment plan), if available.    Ran test claim for Eliquis 5 mg and the current 30 day co-pay is $0.00.   This test claim was processed through [facility]- copay amounts may vary at other pharmacies due to pharmacy/plan contracts, or as the patient moves through the different stages of their insurance plan.     Roland Earl, CPHT Pharmacy Technician III Certified Patient Advocate [facility] Patient Advocate Team Direct Number: [phone]  Fax: [phone]

## 2023-04-09 NOTE — Telephone Encounter (Signed)
Thanks! I added to hospital AVS. Appreciate your help!

## 2023-04-09 NOTE — Progress Notes (Addendum)
Brief same day note:    PROGRESS NOTE  Jacob Daniel  WUJ:[id number] DOB: 02/22/1956 DOA: 04/06/2023 PCP: Judd Lien, PA-C   Brief Narrative: Patient is a 68 year old male with history of ESRD on dialysis on Monday, Wednesday, Friday, failed renal transplant, hypertension, hyperlipidemia, chronic normocytic anemia who presented here with complaint of shortness of breath.  On presentation, he was found to be in A-fib with RVR with heart rate in the range of 110-120.  Chest x-ray showed possible pulmonary edema, BNP elevated.  Started on heparin drip.  Cardiology consulted.  Echo showed diminished EF of 30 to 35%, global hypokinesis.  S/P right/left heart cath which showed severe pulmonary hypertension, severe right ventricular dysfunction, diffuse coronary artery disease consistent with nonischemic cardiomyopathy.  Cardiology recommend outpatient follow-up.  PT evaluation, dialysis pending today.  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  Cardiology consulted.  Started on heparin drip.    On metoprolol.  Anticoagulation changed to Eliquis  HFrEF: Echo 5 years ago had shown normal EF.  Echo done here showed EF of 30 to 35 %, global hypokinesis .  Cardiology consulted.   LDL of 45.  On Toprol,  Lipitor, aspirin, ARB.S/P right/left heart cath which showed severe pulmonary hypertension, severe right ventricular dysfunction, diffuse coronary artery disease consistent with nonischemic cardiomyopathy.  Cardiology recommend outpatient follow-up.   Pulmonary hypertension:Could be contributing to dyspnea.  Currently on room air.  VQ scan ordered to rule out chronic thromboembolic disease, also ordered ANA/RF to rule out rheumatological disease for pulmonary hypertension, pending PFT with DLCO   Elevated BNP: Likely from volume overload.  Volume management as per dialysis.  2D echo as above  RSV infection: Has some dry cough.  Ordered cough medication.  Lungs are mostly clear on auscultation.  On room air  Elevated troponin: Likely from demand ischemia/chf.  No chest pain.  Echo /cath as above  ESRD on dialysis: Dialyzed on Monday, Wednesday, Friday.  Nephrology consulted.  Plan for dialysis today  Normocytic anemia: Likely from CKD  Hypertension: On losartan, metoprolol, Cardura  Hyperlipidemia: Statin  Hypokalemia: Supplemented with potassium and corrected  Debility/weakness: Will consult PT.  Lives in an apartment by himself  Suspected sleep apnea: Outpatient sleep study recommended.        DVT prophylaxis:IV heparin apixaban (ELIQUIS) tablet 5 mg     Code Status: Full Code  Family Communication: None at the bedside  Patient status: Inpatient  Patient is from :home  Anticipated discharge NF:AOZH  Estimated DC date: Tomorrow   Consultants: Cardiology, nephrology  Procedures: None yet  Antimicrobials:  Anti-infectives (From admission, onward)    None       Subjective: Patient seen and the bedside today.  He was lying in bed.  Overall comfortable, on room air.  Has some cough but denies any shortness of breath.  He says he feels better today.  He does not feel ready to go home today and is looking forward for tomorrow .  Dialysis pending  today  Objective: Vitals:   04/09/23 0015 04/09/23 0517 04/09/23 0519 04/09/23 0710  BP: 133/88 (!) 183/94 (!) 196/76 (!) 167/89  Pulse: 69 65 65   Resp: 16 18    Temp: 98 F (36.7 C) 98.3 F (36.8 C)    TempSrc: Oral Oral    SpO2: 94% 96% 93%   Weight:      Height:        Intake/Output Summary (Last 24 hours) at 04/09/2023 1251 Last data filed at 04/09/2023 0455 Gross per 24 hour  Intake 159.09 ml  Output 3500 ml  Net -3340.91 ml   Filed Weights   04/08/23 0500 04/08/23 0936 04/08/23 1356  Weight: 92.5 kg 92.5 kg 89 kg    Examination:  General exam: Overall comfortable, not in distress, chronically ill looking HEENT: PERRL Respiratory system:  no wheezes or crackles  Cardiovascular system: Irregularly irregular rhythm Gastrointestinal system: Abdomen is nondistended, soft and nontender. Central nervous system: Alert and oriented Extremities: No edema, no clubbing ,no cyanosis, left upper extremity AV fistula Skin: No rashes, no ulcers,no icterus     Data Reviewed: I have personally reviewed following labs and imaging studies  CBC: Recent Labs  Lab 04/06/23 2113 04/07/23 0437 04/08/23 0657 04/08/23 1703 04/08/23 1707 04/09/23 0439  WBC 7.4 7.2 8.4  --   --  6.5  HGB 10.2* 9.5* 11.0* 12.2* 12.2*  11.9* 9.8*  HCT 30.6* 28.6* 33.0* 36.0* 36.0*  35.0* 29.5*  MCV 87.9 87.7 88.2  --   --  87.3  PLT 168 160 179  --   --  162   Basic Metabolic Panel: Recent Labs  Lab 04/06/23 2113 04/07/23 0437 04/08/23 0657 04/08/23 1703 04/08/23 1707 04/09/23 0439  NA 137 135 132* 135 135  135 135  K 3.5 3.2* 5.0 3.9 3.9  3.9 4.1  CL 98 99 96*  --   --  94*  CO2 22 25 20*  --   --  25  GLUCOSE 103* 88 93  --   --  77  BUN 47* 45* 60*  --   --  47*  CREATININE 7.86* 7.79* 9.23*  --   --  7.26*  CALCIUM 8.3* 7.8* 8.3*  --   --  8.4*     Recent Results (from the past 240 hours)  Resp panel by RT-PCR (RSV, Flu A&B, Covid) Anterior Nasal Swab     Status: Abnormal    Collection Time: 04/06/23 11:43 PM   Specimen: Anterior Nasal Swab  Result Value Ref Range Status   SARS Coronavirus 2 by RT PCR NEGATIVE NEGATIVE Final   Influenza A by PCR NEGATIVE NEGATIVE Final   Influenza B by PCR NEGATIVE NEGATIVE Final    Comment: (NOTE) The Xpert Xpress SARS-CoV-2/FLU/RSV plus assay is intended as an aid in the diagnosis of influenza from Nasopharyngeal swab specimens and should not be used as a sole basis for treatment. Nasal washings and aspirates are unacceptable for Xpert Xpress SARS-CoV-2/FLU/RSV testing.  Fact Sheet for Patients: BloggerCourse.com  Fact Sheet for Healthcare Providers: SeriousBroker.it  This test is not yet approved or cleared by the [place] FDA and has been authorized for detection and/or diagnosis of SARS-CoV-2 by FDA under an Emergency Use Authorization (EUA). This EUA will remain in  effect (meaning this test can be used) for the duration of the COVID-19 declaration under Section 564(b)(1) of the Act, 21 U.S.C. section 360bbb-3(b)(1), unless the authorization is terminated or revoked.     Resp Syncytial Virus by PCR POSITIVE (A) NEGATIVE Final    Comment: (NOTE) Fact Sheet for Patients: BloggerCourse.com  Fact Sheet for Healthcare Providers: SeriousBroker.it  This test is not yet approved or cleared by the [place] FDA and has been authorized for detection and/or diagnosis of SARS-CoV-2 by FDA under an Emergency Use Authorization (EUA). This EUA will remain in effect (meaning this test can be used) for the duration of the COVID-19 declaration under Section 564(b)(1) of the Act, 21 U.S.C. section 360bbb-3(b)(1), unless the authorization is terminated or revoked.  Performed at [facility], [address] [place], [place] [address]      Radiology Studies: CARDIAC CATHETERIZATION Result Date:  04/08/2023 Images from the original result were not included. Right & Left Heart Catheterization 04/08/23: Hemodynamic data: RA 32/36, mean 29 mmHg RV 67/10, EDP 25 mmHg PA 72/40, mean 46 mmHg.  PA saturation 63%. PW 45/49, mean 42 mmHg. QP/QS 1.0.  CO 5.37, CI 2.54, normal. PVR 0.75, PAPi 1.1, severely reduced suggestive of RV dysfunction. LVEDP 28 mmHg.  No pressure gradient across the aortic valve. Angiographic data: LM: Large-caliber vessel, calcified but normal. LAD: Large-caliber vessel, severely calcified with moderate to diffuse atherosclerosis, apical LAD has calcific 90% stenosis.  D1 is small to moderate-sized again with diffuse disease. LCx: Large-caliber vessel giving origin to large OM1 and a small OM 2, again diffuse calcification is evident. RCA: Very large caliber vessel, there is a proximal 30% and a mid 30% stenosis again severe diffuse calcification is evident. Impression and recommendations: Right heart catheterization suggestive of severe pulmonary hypertension with severely elevated LVEDP, preserved cardiac output and index, markedly reduced PAPi suggests severe RV dysfunction. Severe diffuse coronary calcification.  No proximal vessel significant disease. Findings consistent with nonischemic cardiomyopathy.    Scheduled Meds:  apixaban  5 mg Oral BID   aspirin EC  81 mg Oral Daily   atorvastatin  10 mg Oral Daily   calcium acetate  2,001 mg Oral TID WC   Chlorhexidine Gluconate Cloth  6 each Topical Q0600   chlorpheniramine-HYDROcodone  5 mL Oral Q12H   doxazosin  4 mg Oral QHS   ferrous sulfate  325 mg Oral Q breakfast   irbesartan  300 mg Oral Daily   magnesium oxide  400 mg Oral Daily   metoprolol succinate  25 mg Oral Once   [START ON 04/10/2023] metoprolol succinate  75 mg Oral Daily   sodium bicarbonate  650 mg Oral BID   sucroferric oxyhydroxide  500 mg Oral q AM   tacrolimus  1 mg Oral Daily   Continuous Infusions:  sodium chloride       LOS: 2 days   Burnadette Pop, MD [facility] P1/24/2025, 12:51 PM

## 2023-04-09 NOTE — Progress Notes (Addendum)
Progress Note  Patient Name: Jacob Daniel Date of Encounter: 04/09/2023  Primary Cardiologist: Armanda Magic, MD  Subjective   "Compared to when I first came in, I feel wonderful. 100% better." No CP or SOB.  In and out of AF on telemetry, unable to feel this.  Inpatient Medications    Scheduled Meds:  aspirin EC  81 mg Oral Daily   atorvastatin  10 mg Oral Daily   calcium acetate  2,001 mg Oral TID WC   chlorpheniramine-HYDROcodone  5 mL Oral Q12H   doxazosin  4 mg Oral QHS   ferrous sulfate  325 mg Oral Q breakfast   irbesartan  300 mg Oral Daily   magnesium oxide  400 mg Oral Daily   metoprolol succinate  50 mg Oral Daily   metoprolol tartrate  25 mg Oral BID   sodium bicarbonate  650 mg Oral BID   sucroferric oxyhydroxide  500 mg Oral q AM   tacrolimus  1 mg Oral Daily   Continuous Infusions:  sodium chloride     heparin 1,750 Units/hr (04/09/23 0455)   PRN Meds: sodium chloride, acetaminophen **OR** acetaminophen, albuterol, sodium chloride flush   Vital Signs    Vitals:   04/08/23 2000 04/09/23 0015 04/09/23 0517 04/09/23 0519  BP: (!) 139/90 133/88 (!) 183/94 (!) 196/76  Pulse: 71 69 65 65  Resp: 20 16 18    Temp: 97.9 F (36.6 C) 98 F (36.7 C) 98.3 F (36.8 C)   TempSrc: Oral Oral Oral   SpO2: 92% 94% 96% 93%  Weight:      Height:        Intake/Output Summary (Last 24 hours) at 04/09/2023 0757 Last data filed at 04/09/2023 0455 Gross per 24 hour  Intake 159.09 ml  Output 3500 ml  Net -3340.91 ml      04/08/2023    1:56 PM 04/08/2023    9:36 AM 04/08/2023    5:00 AM  Last 3 Weights  Weight (lbs) 196 lb 3.4 oz 203 lb 14.8 oz 203 lb 14.4 oz  Weight (kg) 89 kg 92.5 kg 92.488 kg     Telemetry    In and out of NSR/PAF with 5 beats NSVT - Personally Reviewed  ECG    NSR 66bpm with occasional PACs, QT registering , hand calculated at - Personally Reviewed  Physical Exam   GEN: No acute distress.  HEENT: Normocephalic,  atraumatic, sclera non-icteric. Neck: No JVD or bruits. Cardiac: RRR no murmurs, rubs, or gallops.  Respiratory: Clear to auscultation bilaterally. Breathing is unlabored. GI: Soft, nontender, non-distended, BS +x 4. MS: no deformity. Extremities: No clubbing or cyanosis. No edema. Distal pedal pulses are 2+ and equal bilaterally. Right groin cath site without hematoma, ecchymosis, or bruit. Neuro:  AAOx3. Follows commands. Psych:  Responds to questions appropriately with a normal affect.  Labs    High Sensitivity Troponin:   Recent Labs  Lab 04/06/23 2113 04/07/23 0053 04/07/23 0304 04/07/23 0437  TROPONINIHS 122* 107* 109* 124*      Cardiac EnzymesNo results for input(s): "TROPONINI" in the last 168 hours. No results for input(s): "TROPIPOC" in the last 168 hours.   Chemistry Recent Labs  Lab 04/07/23 0437 04/08/23 0657 04/08/23 1703 04/08/23 1707 04/09/23 0439  NA 135 132* 135 135  135 135  K 3.2* 5.0 3.9 3.9  3.9 4.1  CL 99 96*  --   --  94*  CO2 25 20*  --   --  25  GLUCOSE 88 93  --   --  77  BUN 45* 60*  --   --  47*  CREATININE 7.79* 9.23*  --   --  7.26*  CALCIUM 7.8* 8.3*  --   --  8.4*  PROT 7.2  --   --   --   --   ALBUMIN 2.5*  --   --   --   --   AST 21  --   --   --   --   ALT 20  --   --   --   --   ALKPHOS 63  --   --   --   --   BILITOT 1.8*  --   --   --   --   GFRNONAA 7* 6*  --   --  8*  ANIONGAP 11 16*  --   --  16*     Hematology Recent Labs  Lab 04/07/23 0437 04/08/23 0657 04/08/23 1703 04/08/23 1707 04/09/23 0439  WBC 7.2 8.4  --   --  6.5  RBC 3.26* 3.74*  --   --  3.38*  HGB 9.5* 11.0* 12.2* 12.2*  11.9* 9.8*  HCT 28.6* 33.0* 36.0* 36.0*  35.0* 29.5*  MCV 87.7 88.2  --   --  87.3  MCH 29.1 29.4  --   --  29.0  MCHC 33.2 33.3  --   --  33.2  RDW 17.2* 18.2*  --   --  18.0*  PLT 160 179  --   --  162    BNP Recent Labs  Lab 04/06/23 2113  BNP >4,500.0*     DDimer No results for input(s): "DDIMER" in the last 168  hours.   Radiology    CARDIAC CATHETERIZATION Result Date: 04/08/2023 Images from the original result were not included. Right & Left Heart Catheterization 04/08/23: Hemodynamic data: RA 32/36, mean 29 mmHg RV 67/10, EDP 25 mmHg PA 72/40, mean 46 mmHg.  PA saturation 63%. PW 45/49, mean 42 mmHg. QP/QS 1.0.  CO 5.37, CI 2.54, normal. PVR 0.75, PAPi 1.1, severely reduced suggestive of RV dysfunction. LVEDP 28 mmHg.  No pressure gradient across the aortic valve. Angiographic data: LM: Large-caliber vessel, calcified but normal. LAD: Large-caliber vessel, severely calcified with moderate to diffuse atherosclerosis, apical LAD has calcific 90% stenosis.  D1 is small to moderate-sized again with diffuse disease. LCx: Large-caliber vessel giving origin to large OM1 and a small OM 2, again diffuse calcification is evident. RCA: Very large caliber vessel, there is a proximal 30% and a mid 30% stenosis again severe diffuse calcification is evident. Impression and recommendations: Right heart catheterization suggestive of severe pulmonary hypertension with severely elevated LVEDP, preserved cardiac output and index, markedly reduced PAPi suggests severe RV dysfunction. Severe diffuse coronary calcification.  No proximal vessel significant disease. Findings consistent with nonischemic cardiomyopathy.   ECHOCARDIOGRAM COMPLETE Result Date: 04/07/2023    ECHOCARDIOGRAM REPORT   Patient Name:   Jacob Daniel Date of Exam: 04/07/2023 Medical Rec #:  [id number]        Height:       72.0 in Accession #:    [id number]       Weight:       196.0 lb Date of Birth:  29-Nov-1955        BSA:          2.112 m Patient Age:    68 years         BP:  134/106 mmHg Patient Gender: M                HR:           71 bpm. Exam Location:  Inpatient Procedure: 2D Echo, Cardiac Doppler, Color Doppler and Intracardiac            Opacification Agent Indications:    I48.91* Unspeicified atrial fibrillation  History:        Patient has no  prior history of Echocardiogram examinations. CHF                 and Cardiomyopathy, Arrythmias:Atrial Flutter; Risk                 Factors:Hypertension and Diabetes.  Sonographer:    Webb Laws Referring Phys: [id number] ARSHAD N KAKRAKANDY IMPRESSIONS  1. Left ventricular ejection fraction, by estimation, is 30 to 35%. The left ventricle has moderately decreased function. The left ventricle demonstrates global hypokinesis. There is mild left ventricular hypertrophy. Left ventricular diastolic parameters are indeterminate.  2. Right ventricular systolic function is moderately reduced. The right ventricular size is mildly enlarged. There is mildly elevated pulmonary artery systolic pressure. The estimated right ventricular systolic pressure is 44.5 mmHg.  3. Left atrial size was mildly dilated.  4. The mitral valve is normal in structure. Mild mitral valve regurgitation. No evidence of mitral stenosis.  5. The aortic valve is tricuspid. Aortic valve regurgitation is trivial. No aortic stenosis is present.  6. Aortic dilatation noted. There is dilatation of the ascending aorta, measuring 40 mm.  7. The inferior vena cava is dilated in size with >50% respiratory variability, suggesting right atrial pressure of 8 mmHg. FINDINGS  Left Ventricle: Left ventricular ejection fraction, by estimation, is 30 to 35%. The left ventricle has moderately decreased function. The left ventricle demonstrates global hypokinesis. Definity contrast agent was given IV to delineate the left ventricular endocardial borders. The left ventricular internal cavity size was normal in size. There is mild left ventricular hypertrophy. Left ventricular diastolic parameters are indeterminate. Right Ventricle: The right ventricular size is mildly enlarged. No increase in right ventricular wall thickness. Right ventricular systolic function is moderately reduced. There is mildly elevated pulmonary artery systolic pressure. The tricuspid regurgitant  velocity is 3.02 m/s, and with an assumed right atrial pressure of 8 mmHg, the estimated right ventricular systolic pressure is 44.5 mmHg. Left Atrium: Left atrial size was mildly dilated. Right Atrium: Right atrial size was normal in size. Pericardium: There is no evidence of pericardial effusion. Mitral Valve: The mitral valve is normal in structure. Mild mitral valve regurgitation. No evidence of mitral valve stenosis. Tricuspid Valve: The tricuspid valve is normal in structure. Tricuspid valve regurgitation is mild. Aortic Valve: The aortic valve is tricuspid. Aortic valve regurgitation is trivial. No aortic stenosis is present. Pulmonic Valve: The pulmonic valve was not well visualized. Pulmonic valve regurgitation is trivial. Aorta: The aortic root is normal in size and structure and aortic dilatation noted. There is dilatation of the ascending aorta, measuring 40 mm. Venous: The inferior vena cava is dilated in size with greater than 50% respiratory variability, suggesting right atrial pressure of 8 mmHg. IAS/Shunts: The interatrial septum was not well visualized.  LEFT VENTRICLE PLAX 2D LVIDd:         5.70 cm      Diastology LVIDs:         4.80 cm      LV e' medial:    5.91 cm/s LV PW:  1.00 cm      LV E/e' medial:  19.8 LV IVS:        1.10 cm      LV e' lateral:   12.10 cm/s LVOT diam:     2.00 cm      LV E/e' lateral: 9.7 LV SV:         39 LV SV Index:   19 LVOT Area:     3.14 cm  LV Volumes (MOD) LV vol d, MOD A2C: 132.0 ml LV vol d, MOD A4C: 104.0 ml LV vol s, MOD A2C: 64.1 ml LV vol s, MOD A4C: 72.0 ml LV SV MOD A2C:     67.9 ml LV SV MOD A4C:     104.0 ml LV SV MOD BP:      48.3 ml RIGHT VENTRICLE            IVC RV Basal diam:  4.10 cm    IVC diam: 2.90 cm RV S prime:     8.70 cm/s TAPSE (M-mode): 1.9 cm LEFT ATRIUM             Index        RIGHT ATRIUM           Index LA diam:        4.50 cm 2.13 cm/m   RA Area:     19.45 cm LA Vol (A2C):   59.8 ml 28.31 ml/m  RA Volume:   50.80 ml  24.05  ml/m LA Vol (A4C):   94.8 ml 44.88 ml/m LA Biplane Vol: 77.4 ml 36.64 ml/m  AORTIC VALVE LVOT Vmax:   92.12 cm/s LVOT Vmean:  55.320 cm/s LVOT VTI:    0.125 m  AORTA Ao Root diam: 3.60 cm Ao Asc diam:  4.00 cm MITRAL VALVE                TRICUSPID VALVE MV Area (PHT): 5.97 cm     TR Peak grad:   36.5 mmHg MV Decel Time: 127 msec     TR Vmax:        302.00 cm/s MR Peak grad: 86.5 mmHg MR Mean grad: 59.0 mmHg     SHUNTS MR Vmax:      465.00 cm/s   Systemic VTI:  0.13 m MR Vmean:     361.0 cm/s    Systemic Diam: 2.00 cm MV E velocity: 117.00 cm/s Epifanio Lesches MD Electronically signed by Epifanio Lesches MD Signature Date/Time: 04/07/2023/11:50:24 AM    Final     Cardiac Studies   [facility] 04/09/23 rization 05/01/2023: Hemodynamic data: RA 32/36, mean 29 mmHg RV 67/10, EDP 25 mmHg PA 72/40, mean 46 mmHg.  PA saturation 63%. PW 45/49, mean 42 mmHg. QP/QS 1.0.  CO 5.37, CI 2.54, normal. PVR 0.75, PAPi 1.1, severely reduced suggestive of RV dysfunction.   LVEDP 28 mmHg.  No pressure gradient across the aortic valve.   Angiographic data: LM: Large-caliber vessel, calcified but normal. LAD: Large-caliber vessel, severely calcified with moderate to diffuse atherosclerosis, apical LAD has calcific 90% stenosis.  D1 is small to moderate-sized again with diffuse disease. LCx: Large-caliber vessel giving origin to large OM1 and a small OM 2, again diffuse calcification is evident. RCA: Very large caliber vessel, there is a proximal 30% and a mid 30% stenosis again severe diffuse calcification is evident.    Impression and recommendations: Right heart catheterization suggestive of severe pulmonary hypertension with severely elevated LVEDP, preserved cardiac output and index, markedly reduced  PAPi suggests severe RV dysfunction. Severe diffuse coronary calcification.  No proximal vessel significant disease. Findings consistent with nonischemic cardiomyopathy.   2D echo 04/07/23  1. Left  ventricular ejection fraction, by estimation, is 30 to 35%. The  left ventricle has moderately decreased function. The left ventricle  demonstrates global hypokinesis. There is mild left ventricular  hypertrophy. Left ventricular diastolic  parameters are indeterminate.   2. Right ventricular systolic function is moderately reduced. The right  ventricular size is mildly enlarged. There is mildly elevated pulmonary  artery systolic pressure. The estimated right ventricular systolic  pressure is 44.5 mmHg.   3. Left atrial size was mildly dilated.   4. The mitral valve is normal in structure. Mild mitral valve  regurgitation. No evidence of mitral stenosis.   5. The aortic valve is tricuspid. Aortic valve regurgitation is trivial.  No aortic stenosis is present.   6. Aortic dilatation noted. There is dilatation of the ascending aorta,  measuring 40 mm.   7. The inferior vena cava is dilated in size with >50% respiratory  variability, suggesting right atrial pressure of 8 mmHg.   Patient Profile     68 y.o. male with chronic HFrEF (no prior cath), ESRD on hemodialysis on Mon/Wed/Fri, failed renal transplant takes Prograf, prior nonadherence with medications/behavioral concerns, hypertension, hyperlipidemia, chronic anemia. Presented with worsening SOB/wheezing, recent mechanical fall, found to have atrial fib RVR by EMS report, here in Afib/flutter. Resp viral panel + RSV. Troponin was elevated. R/LHC above showed severe diffuse coronary calcification but no significant proximal vessel disease, severe pHTN with severely elevated LVEDP, severe RV dysfunction.    Assessment & Plan    1. New onset atrial fib/flutter, brief NSVT, borderline prolonged QT interval  - carvedilol 50mg  at bedtime and metoprolol 25mg  BID listed on med list, listed as not taking carvedilol PTA - TSH OK - HR currently 70s-90s on metoprolol 25mg  BID, continue, transition to Toprol 50mg  daily today - in and out of PAF on  telemetry, asymptomatic - currently NSR - await MD input on rhythm management and transition to [facility] (per pharmacist, Eliquis is low cost for patient). Suspect with adherence concerns and resolution of symptoms that rate control strategy with outpatient follow-up is appropriate. - lyte mgmt per nephro - care order written to replace cath site tegaderm with clean bandaid x1 day   2. RSV infection with wheezing - care per primary team   3. Acute on chronic HFrEF, NICM, severe pHTN, severe RV dysfunction, HTN - prior workup in CareEverywhere -  EF last known to be normal 05/2022; echo 07/2022 EF 40% with nuc at that time showing mild inferolateral hypokinesia, fixed inferior wall defect, EF 51%, then repeat echo 10/2022 EF 35-40% with moderate-severe TR, mild-moderate MR - cath advised, patient preferred echo follow-up instead, lost to f/u after - 2d echo here EF 30-35%, mild LVH, global HK, moderately reduced RV function, mild pHTN, mild MR, mild dilation of ascending aorta - R/LHC above showed severe diffuse coronary calcification but no significant proximal vessel disease, severe pHTN with severely elevated LVEDP, severe RV dysfunction suggestive of continued fluid rentention - volume managed by HD, query if needs additional HD - SBP remains markedly elevated today will review med plans with MD (current rx irbesartan 300mg  daily in lieu of home losartan, Toprol 50mg  daily, doxazosin 4mg  daily)   4. Elevated troponin level, coronary artery disease without proximal vessel obstruction - anticipate dc ASA when transitioned to OAC - LDL 45, acceptable, on  atorvastatin 10mg  daily   5. ESRD on HD  - volume management per nephrology   6, Mild dilation of ascending aorta - by echo - OP f/u  7. Anemia - per primary team  Patient had f/u scheduled originally to re-establish care in March; have moved up f/u to me on 04/22/23 and outlined on AVS  For questions or updates, please contact [facility] Please consult [email or web address] for contact info under Cardiology/STEMI.  Signed, Laurann Montana, PA-C 04/09/2023, 7:57 AM

## 2023-04-10 ENCOUNTER — Other Ambulatory Visit (HOSPITAL_COMMUNITY): Payer: Self-pay

## 2023-04-10 ENCOUNTER — Inpatient Hospital Stay (HOSPITAL_COMMUNITY): Payer: 59

## 2023-04-10 DIAGNOSIS — I4892 Unspecified atrial flutter: Secondary | ICD-10-CM | POA: Diagnosis not present

## 2023-04-10 LAB — ANA W/REFLEX IF POSITIVE: Anti Nuclear Antibody (ANA): NEGATIVE

## 2023-04-10 MED ORDER — CALCIUM ACETATE (PHOS BINDER) 667 MG PO CAPS
2001.0000 mg | ORAL_CAPSULE | Freq: Three times a day (TID) | ORAL | 0 refills | Status: DC
  Filled 2023-04-10: qty 90, 10d supply, fill #0

## 2023-04-10 MED ORDER — ATORVASTATIN CALCIUM 10 MG PO TABS
10.0000 mg | ORAL_TABLET | Freq: Every day | ORAL | 0 refills | Status: AC
  Filled 2023-04-10: qty 30, 30d supply, fill #0

## 2023-04-10 MED ORDER — FERROUS SULFATE 325 (65 FE) MG PO TABS
325.0000 mg | ORAL_TABLET | Freq: Every day | ORAL | 0 refills | Status: DC
  Filled 2023-04-10: qty 30, 30d supply, fill #0

## 2023-04-10 MED ORDER — MAGNESIUM OXIDE 400 MG PO TABS
400.0000 mg | ORAL_TABLET | Freq: Every day | ORAL | 0 refills | Status: AC
  Filled 2023-04-10: qty 30, 30d supply, fill #0

## 2023-04-10 MED ORDER — TECHNETIUM TO 99M ALBUMIN AGGREGATED
3.9000 | Freq: Once | INTRAVENOUS | Status: AC | PRN
  Administered 2023-04-10: 3.9 via INTRAVENOUS

## 2023-04-10 MED ORDER — SODIUM BICARBONATE 650 MG PO TABS
650.0000 mg | ORAL_TABLET | Freq: Two times a day (BID) | ORAL | 0 refills | Status: DC
  Filled 2023-04-10: qty 60, 30d supply, fill #0

## 2023-04-10 MED ORDER — METOPROLOL SUCCINATE ER 25 MG PO TB24
75.0000 mg | ORAL_TABLET | Freq: Every day | ORAL | 0 refills | Status: AC
  Filled 2023-04-10: qty 30, 10d supply, fill #0

## 2023-04-10 MED ORDER — ASPIRIN 81 MG PO TBEC
81.0000 mg | DELAYED_RELEASE_TABLET | Freq: Every day | ORAL | 0 refills | Status: DC
  Filled 2023-04-10: qty 30, 30d supply, fill #0

## 2023-04-10 MED ORDER — APIXABAN 5 MG PO TABS
5.0000 mg | ORAL_TABLET | Freq: Two times a day (BID) | ORAL | 0 refills | Status: AC
  Filled 2023-04-10: qty 120, 60d supply, fill #0

## 2023-04-10 MED ORDER — IRBESARTAN 300 MG PO TABS
300.0000 mg | ORAL_TABLET | Freq: Every day | ORAL | 0 refills | Status: AC
  Filled 2023-04-10: qty 30, 30d supply, fill #0

## 2023-04-10 MED ORDER — TACROLIMUS 0.5 MG PO CAPS
1.0000 mg | ORAL_CAPSULE | Freq: Every day | ORAL | 0 refills | Status: DC
  Filled 2023-04-10: qty 60, 30d supply, fill #0

## 2023-04-10 NOTE — TOC Transition Note (Signed)
Transition of Care [facility]) - Discharge Note   Patient Details  Name: Jacob Daniel MRN: [id number] Date of Birth: 1956/03/15  Transition of [facility]) CM/SW Contact:  Ronny Bacon, RN Phone Number: 04/10/2023, 12:48 PM   Clinical Narrative:   Patient is being discharged today. Patient unable to find ride home, cab voucher along with rider waiver sent to unit.          Patient Goals and CMS Choice            Discharge Placement                       Discharge Plan and Services Additional resources added to the After Visit Summary for                                       Social Drivers of Health (SDOH) Interventions SDOH Screenings   Food Insecurity: No Food Insecurity (04/07/2023)  Housing: Low Risk  (04/07/2023)  Transportation Needs: No Transportation Needs (04/07/2023)  Utilities: Not At Risk (04/07/2023)  Financial Resource Strain: Low Risk  (11/05/2022)   Received from [facility]  Social Connections: Moderately Integrated (04/08/2023)  Tobacco Use: Low Risk  (04/07/2023)     Readmission Risk Interventions     No data to display

## 2023-04-10 NOTE — Progress Notes (Signed)
Pt. Received TOC meds, signed cab voucher waiver form.

## 2023-04-10 NOTE — Evaluation (Signed)
Physical Therapy Brief Evaluation and Discharge Note Patient Details Name: Jacob Daniel MRN: [id number] DOB: 11/17/1955 Today's Date: 04/10/2023   History of Present Illness  Patient is a 67 year old male admittted 1/21 with complaint of shortness of breath. On presentation, he was found to be in A-fib with RVR with heart rate in the range of 110-120.  Chest x-ray showed possible pulmonary edema,   Started on heparin drip.  Echo showed diminished EF of 30 to 35%, global hypokinesis.  S/P right/left heart cath which showed severe pulmonary hypertension, severe right ventricular dysfunction, diffuse coronary artery disease consistent with nonischemic cardiomyopathy.  Anticoagulation changed to Eliquis. Also with RSV. PMH: ESRD on dialysis on Monday, Wednesday, Friday, failed renal transplant, hypertension, hyperlipidemia, chronic normocytic anemia  Clinical Impression  Pt admitted with above diagnosis. Pt at baseline level of function and doesn't need PT f/u or in hospital. Pt ambulates with independence. No equipment needs.  Will sign off. MD updated.    PT Assessment Patient does not need any further PT services  Assistance Needed at Discharge  None    Equipment Recommendations None recommended by PT  Recommendations for Other Services       Precautions/Restrictions Precautions Precautions: None Precaution Comments: DROPLET Restrictions Weight Bearing Restrictions Per Provider Order: No        Mobility  Bed Mobility Rolling: Independent Supine/Sidelying to sit: Independent Sit to supine/sidelying: Independent    Transfers Overall transfer level: Independent                      Ambulation/Gait Ambulation/Gait assistance: Independent Gait Distance (Feet): 45 Feet Assistive device: None Gait Pattern/deviations: WFL(Within Functional Limits) Gait Speed: Pace WFL General Gait Details: Pt intiiallly refusing to work with PT however with encouragement agreed to  walk to bathroom. Pt without LOB without device and can accept challenges.  Home Activity Instructions    Stairs            Modified Rankin (Stroke Patients Only)        Balance Overall balance assessment: Independent                        Pertinent Vitals/Pain PT - Brief Vital Signs All Vital Signs Stable: Yes Pain Assessment Pain Assessment: No/denies pain     Home Living Family/patient expects to be discharged to:: Private residence Living Arrangements: Alone Available Help at Discharge: Neighbor;Available PRN/intermittently;Personal care attendant (aide 2-3 days week, 3 hours) Home Environment: Level entry   Home Equipment: Pharmacist, hospital (2 wheels);Grab bars - toilet;Grab bars - tub/shower;Hand held shower head   Additional Comments: fell Monday night and that was first fall    Prior Function Level of Independence: Independent      UE/LE Assessment   UE ROM/Strength/Tone/Coordination: WFL    LE ROM/Strength/Tone/Coordination: [facility]      Communication   Communication Communication: No apparent difficulties     Cognition Overall Cognitive Status: Appears within functional limits for tasks assessed/performed       General Comments General comments (skin integrity, edema, etc.): 60 bpm, 95%RA, 129/71    Exercises     Assessment/Plan    PT Problem List         PT Visit Diagnosis Muscle weakness (generalized) (M62.81)    No Skilled PT All education completed;Patient at baseline level of functioning;Patient is independent with all acitivity/mobility   Co-evaluation  AMPAC 6 Clicks Help needed turning from your back to your side while in a flat bed without using bedrails?: None Help needed moving from lying on your back to sitting on the side of a flat bed without using bedrails?: None Help needed moving to and from a bed to a chair (including a wheelchair)?: None Help needed standing up from a  chair using your arms (e.g., wheelchair or bedside chair)?: None Help needed to walk in hospital room?: None Help needed climbing 3-5 steps with a railing? : None 6 Click Score: 24      End of Session Equipment Utilized During Treatment: Gait belt Activity Tolerance: Patient tolerated treatment well Patient left: with call bell/phone within reach (on toilet) Nurse Communication: Mobility status (pt left on toilet) PT Visit Diagnosis: Muscle weakness (generalized) (M62.81)     Time: 1610-9604 PT Time Calculation (min) (ACUTE ONLY): 15 min  Charges:   PT Evaluation $PT Eval Low Complexity: 1 Low      Justyce Baby M,PT Acute Rehab Services [phone]   Bevelyn Buckles  04/10/2023, 11:29 AM

## 2023-04-10 NOTE — Progress Notes (Signed)
[facility] Progress Note   Subjective:   Tolerated HD yesterday with 2.5L UF. Reports feeling well, no new concerns. Denies SOB, CP, dizziness.   Objective Vitals:   04/09/23 2220 04/09/23 2345 04/10/23 0515 04/10/23 0831  BP: 135/87 121/79 128/70 129/71  Pulse:  (!) 57 (!) 59 (!) 55  Resp:  20 17 16   Temp:  98 F (36.7 C) 97.9 F (36.6 C) 98.1 F (36.7 C)  TempSrc:  Oral Oral Oral  SpO2:  98% 100%   Weight:      Height:       Physical Exam General: Well appearing male, alert and in NAD Heart: RRR, no murmurs, rubs or gallops Lungs: CTA bilaterally, respirations unlabored Abdomen: Soft, non-distended, +BS Extremities: No edema b/l lower extremities Dialysis Access: LUE AVF +t/b  Additional Objective Labs: Basic Metabolic Panel: Recent Labs  Lab 04/07/23 0437 04/08/23 0657 04/08/23 1703 04/08/23 1707 04/09/23 0439  NA 135 132* 135 135  135 135  K 3.2* 5.0 3.9 3.9  3.9 4.1  CL 99 96*  --   --  94*  CO2 25 20*  --   --  25  GLUCOSE 88 93  --   --  77  BUN 45* 60*  --   --  47*  CREATININE 7.79* 9.23*  --   --  7.26*  CALCIUM 7.8* 8.3*  --   --  8.4*   Liver Function Tests: Recent Labs  Lab 04/07/23 0437  AST 21  ALT 20  ALKPHOS 63  BILITOT 1.8*  PROT 7.2  ALBUMIN 2.5*   No results for input(s): "LIPASE", "AMYLASE" in the last 168 hours. CBC: Recent Labs  Lab 04/06/23 2113 04/07/23 0437 04/08/23 0657 04/08/23 1703 04/08/23 1707 04/09/23 0439  WBC 7.4 7.2 8.4  --   --  6.5  HGB 10.2* 9.5* 11.0* 12.2* 12.2*  11.9* 9.8*  HCT 30.6* 28.6* 33.0* 36.0* 36.0*  35.0* 29.5*  MCV 87.9 87.7 88.2  --   --  87.3  PLT 168 160 179  --   --  162   Blood Culture No results found for: "SDES", "SPECREQUEST", "CULT", "REPTSTATUS"  Cardiac Enzymes: No results for input(s): "CKTOTAL", "CKMB", "CKMBINDEX", "TROPONINI" in the last 168 hours. CBG: No results for input(s): "GLUCAP" in the last 168 hours. Iron Studies: No results for input(s):  "IRON", "TIBC", "TRANSFERRIN", "FERRITIN" in the last 72 hours. @lablastinr3 @ Studies/Results: DG CHEST PORT 1 VIEW Result Date: 04/09/2023 CLINICAL DATA:  Shortness of breath. EXAM: PORTABLE CHEST 1 VIEW COMPARISON:  04/06/2023 FINDINGS: Stable heart size and mediastinal contours. Aortic atherosclerosis. Improving interstitial thickening from prior exam. No acute airspace disease. No pneumothorax or pleural effusion. Stable osseous structures. IMPRESSION: Improving interstitial thickening from prior exam. No acute findings. Electronically Signed   By: Narda Rutherford M.D.   On: 04/09/2023 19:00   CARDIAC CATHETERIZATION Result Date: 04/08/2023 Images from the original result were not included. Right & Left Heart Catheterization 04/08/23: Hemodynamic data: RA 32/36, mean 29 mmHg RV 67/10, EDP 25 mmHg PA 72/40, mean 46 mmHg.  PA saturation 63%. PW 45/49, mean 42 mmHg. QP/QS 1.0.  CO 5.37, CI 2.54, normal. PVR 0.75, PAPi 1.1, severely reduced suggestive of RV dysfunction. LVEDP 28 mmHg.  No pressure gradient across the aortic valve. Angiographic data: LM: Large-caliber vessel, calcified but normal. LAD: Large-caliber vessel, severely calcified with moderate to diffuse atherosclerosis, apical LAD has calcific 90% stenosis.  D1 is small to moderate-sized again with diffuse disease. LCx:  Large-caliber vessel giving origin to large OM1 and a small OM 2, again diffuse calcification is evident. RCA: Very large caliber vessel, there is a proximal 30% and a mid 30% stenosis again severe diffuse calcification is evident. Impression and recommendations: Right heart catheterization suggestive of severe pulmonary hypertension with severely elevated LVEDP, preserved cardiac output and index, markedly reduced PAPi suggests severe RV dysfunction. Severe diffuse coronary calcification.  No proximal vessel significant disease. Findings consistent with nonischemic cardiomyopathy.   Medications:   apixaban  5 mg Oral BID    aspirin EC  81 mg Oral Daily   atorvastatin  10 mg Oral Daily   calcium acetate  2,001 mg Oral TID WC   Chlorhexidine Gluconate Cloth  6 each Topical Q0600   chlorpheniramine-HYDROcodone  5 mL Oral Q12H   doxazosin  4 mg Oral QHS   ferrous sulfate  325 mg Oral Q breakfast   irbesartan  300 mg Oral Daily   magnesium oxide  400 mg Oral Daily   metoprolol succinate  75 mg Oral Daily   sodium bicarbonate  650 mg Oral BID   sucroferric oxyhydroxide  500 mg Oral q AM   tacrolimus  1 mg Oral Daily    Dialysis Orders: [facility] MWF L AVF  EDW just increased 89.5 kg to 90.5 kg   Assessment/Plan: Acute hypoxic resp failure - Volume overload + RSV infection. Pulm edema on CXR. Gross vol overload on exam. Reaching OP dry weight. Likely losing body weight and needs EDW adjustment. Last post HD weight 87.3kg A-fib/flutter w RVR - on admit. Rate controlled this am. On metoprolol. Per cardiology ESRD - on HD MWF. Back on MWF schedule here, next HD Monday HTN - BP's improved after HD. 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 Follow.   Secondary hyperparathyroidism - CCa in range, check phos with next labs. Cont binders w/ meals.  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     Rogers Blocker, PA-C 04/10/2023, 8:54 AM  [facility] Pager: [phone]

## 2023-04-10 NOTE — Discharge Summary (Signed)
Physician Discharge Summary  Adriell Polansky WGN:[id number] DOB: Aug 23, 1955 DOA: 04/06/2023  PCP: Judd Lien, PA-C  Admit date: 04/06/2023 Discharge date: 04/10/2023  Admitted From: Home Disposition:  Home  Discharge Condition:Stable CODE STATUS:FULL Diet recommendation: Renal  Brief/Interim Summary: Patient is a 68 year old male with history of ESRD on dialysis on Monday, Wednesday, Friday, failed renal transplant, hypertension, hyperlipidemia, chronic normocytic anemia who presented here with complaint of shortness of breath.  On presentation, he was found to be in A-fib with RVR with heart rate in the range of 110-120.  Chest x-ray showed possible pulmonary edema, BNP elevated.  Started on heparin drip.  Cardiology consulted.  Echo showed diminished EF of 30 to 35%, global hypokinesis.  S/P right/left heart cath which showed severe pulmonary hypertension, severe right ventricular dysfunction, diffuse coronary artery disease consistent with nonischemic cardiomyopathy.  Anticoagulation changed to Eliquis.  Cardiology recommend outpatient follow-up.  PT recommended no follow-up.  He will follow-up with cardiology as an outpatient.  Medically stable for discharge today.  Next dialysis on Monday.  Following problems were addressed during the hospitalization:  New onset A-fib with RVR: No history of A-fib.CHADS2 Vasc score of  least 2 .  Cardiology consulted.  Started on heparin drip.    On metoprolol.  Anticoagulation changed to Eliquis   HFrEF: Echo 5 years ago had shown normal EF.  Echo done here showed EF of 30 to 35 %, global hypokinesis .  Cardiology consulted.   LDL of 45.  On Toprol, Lipitor, aspirin, ARB.S/P right/left heart cath which showed severe pulmonary hypertension, severe right ventricular dysfunction, diffuse coronary artery disease consistent with nonischemic cardiomyopathy.  Cardiology recommend outpatient follow-up.    Pulmonary hypertension:Could be contributing to dyspnea.   Currently on room air.  VQ scan ordered to rule out chronic thromboembolic disease, also ordered ANA/RF to rule out rheumatological disease for pulmonary hypertension, pending PFT with DLCO .  These tests have not been done yet and it  is weekend.  They can be done as an outpatient as per cardiology assistance  as an outpatient  Elevated BNP: Likely from volume overload.  Volume management as per dialysis.  2D echo as above   RSV infection: Has some dry cough. .  Lungs are mostly clear on auscultation.  On room air   Elevated troponin: Likely from demand ischemia/chf.  No chest pain.  Echo /cath as above   ESRD on dialysis: Dialyzed on Monday, Wednesday, Friday.  Nephrology consulted.  Next Allises on Monday.   Normocytic anemia: Likely from CKD   Hypertension: On ARB,metoprolol   Hyperlipidemia: On Statin   Hypokalemia: Supplemented with potassium and corrected   Debility/weakness: Will consult PT.  Lives in an apartment by himself.  PT did not recommend any follow-up   Suspected sleep apnea: Outpatient sleep study recommended.  Discharge Diagnoses:  Principal Problem:   Atrial flutter with rapid ventricular response (HCC) Active Problems:   History of renal transplant   Hypertension   Anemia   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   Type 2 diabetes mellitus with complications (HCC)   End stage renal disease (HCC)   RSV (respiratory syncytial virus pneumonia)   Acute pulmonary edema (HCC)   Atrial fibrillation with RVR (HCC)   Acute on chronic systolic CHF (congestive heart failure) (HCC)   Coronary artery calcification seen on CAT scan   Pure hypercholesterolemia   Pulmonary hypertension, unspecified [facility])    Discharge Instructions  Discharge Instructions     Diet -  low sodium heart healthy   Complete by: As directed    Discharge instructions   Complete by: As directed    1)Please take prescribed medications as instructed 2)Follow up  with your PCP in a week 3)Go for dialysis as scheduled 4)You have an appointment with cardiology on 04/22/2023 at 2:20 PM.  We recommended to sleep study, ventilation/perfusion study test and pulmonary function test as an outpatient   Increase activity slowly   Complete by: As directed       Allergies as of 04/10/2023   No Known Allergies      Medication List     STOP taking these medications    carvedilol 25 MG tablet Commonly known as: COREG   doxazosin 4 MG tablet Commonly known as: CARDURA   ferrous sulfate 325 (65 FE) MG EC tablet Replaced by: ferrous sulfate 325 (65 FE) MG tablet   losartan 100 MG tablet Commonly known as: COZAAR   metoprolol tartrate 25 MG tablet Commonly known as: LOPRESSOR   Velphoro 500 MG chewable tablet Generic drug: sucroferric oxyhydroxide       TAKE these medications    acetaminophen-codeine 300-15 MG tablet Commonly known as: TYLENOL #2 Take 1 tablet by mouth every 8 (eight) hours as needed for moderate pain (pain score 4-6).   albuterol 0.63 MG/3ML nebulizer solution Commonly known as: ACCUNEB Take 1 ampule by nebulization every 6 (six) hours as needed for wheezing.   albuterol 108 (90 Base) MCG/ACT inhaler Commonly known as: VENTOLIN HFA Inhale 2 puffs into the lungs every 6 (six) hours as needed for wheezing.   apixaban 5 MG Tabs tablet Commonly known as: ELIQUIS Take 1 tablet (5 mg total) by mouth 2 (two) times daily.   aspirin EC 81 MG tablet Take 1 tablet (81 mg total) by mouth daily. Swallow whole. Start taking on: April 11, 2023   atorvastatin 10 MG tablet Commonly known as: LIPITOR Take 1 tablet (10 mg total) by mouth daily.   calcium acetate 667 MG capsule Commonly known as: PHOSLO Take 3 capsules (2,001 mg total) by mouth 3 (three) times daily with meals.   ferrous sulfate 325 (65 FE) MG tablet Take 1 tablet (325 mg total) by mouth daily with breakfast. Start taking on: April 11, 2023 Replaces:  ferrous sulfate 325 (65 FE) MG EC tablet   indomethacin 25 MG capsule Commonly known as: INDOCIN Take 25 mg by mouth 2 (two) times daily as needed (for gout flare (with a meal)).   ipratropium-albuterol 0.5-2.5 (3) MG/3ML Soln Commonly known as: DUONEB Take 3 mLs by nebulization every 6 (six) hours as needed (for shortness of breath and wheezing).   irbesartan 300 MG tablet Commonly known as: AVAPRO Take 1 tablet (300 mg total) by mouth daily. Start taking on: April 11, 2023   magnesium oxide 400 MG tablet Commonly known as: MAG-OX Take 1 tablet (400 mg total) by mouth daily.   metoprolol succinate 25 MG 24 hr tablet Commonly known as: TOPROL-XL Take 3 tablets (75 mg total) by mouth daily. Start taking on: April 11, 2023   sildenafil 20 MG tablet Commonly known as: REVATIO Take 20-60 mg by mouth daily as needed (erectile dysfunction).   sodium bicarbonate 650 MG tablet Take 1 tablet (650 mg total) by mouth 2 (two) times daily.   tacrolimus 1 MG capsule Commonly known as: PROGRAF Take 1 capsule (1 mg total) by mouth daily.        Follow-up Information     Dunn,  Dayna N, PA-C Follow up.   Specialties: Cardiology, Radiology Why: [facility] - [facility] location - cardiology follow-up Thursday Apr 22, 2023 2:20 PM (Arrive by 2:05 PM) Contact information: [address] [place] [address] [phone]         Romie Minus, MD Follow up.   Specialty: Cardiology Why: [facility] at [facility] Appointment with Dr. Elwyn Lade Monday Apr 26, 2023 3:20 PM Arrive 15 minutes prior to appointment to check in Entrance C, valet parking available Contact information: [address] [place] [address] [phone]                No Known Allergies  Consultations: Cardiology   Procedures/Studies: DG CHEST PORT 1 VIEW Result Date: 04/09/2023 CLINICAL DATA:  Shortness of breath. EXAM: PORTABLE CHEST  1 VIEW COMPARISON:  04/06/2023 FINDINGS: Stable heart size and mediastinal contours. Aortic atherosclerosis. Improving interstitial thickening from prior exam. No acute airspace disease. No pneumothorax or pleural effusion. Stable osseous structures. IMPRESSION: Improving interstitial thickening from prior exam. No acute findings. Electronically Signed   By: Narda Rutherford M.D.   On: 04/09/2023 19:00   CARDIAC CATHETERIZATION Result Date: 04/08/2023 Images from the original result were not included. Right & Left Heart Catheterization 04/08/23: Hemodynamic data: RA 32/36, mean 29 mmHg RV 67/10, EDP 25 mmHg PA 72/40, mean 46 mmHg.  PA saturation 63%. PW 45/49, mean 42 mmHg. QP/QS 1.0.  CO 5.37, CI 2.54, normal. PVR 0.75, PAPi 1.1, severely reduced suggestive of RV dysfunction. LVEDP 28 mmHg.  No pressure gradient across the aortic valve. Angiographic data: LM: Large-caliber vessel, calcified but normal. LAD: Large-caliber vessel, severely calcified with moderate to diffuse atherosclerosis, apical LAD has calcific 90% stenosis.  D1 is small to moderate-sized again with diffuse disease. LCx: Large-caliber vessel giving origin to large OM1 and a small OM 2, again diffuse calcification is evident. RCA: Very large caliber vessel, there is a proximal 30% and a mid 30% stenosis again severe diffuse calcification is evident. Impression and recommendations: Right heart catheterization suggestive of severe pulmonary hypertension with severely elevated LVEDP, preserved cardiac output and index, markedly reduced PAPi suggests severe RV dysfunction. Severe diffuse coronary calcification.  No proximal vessel significant disease. Findings consistent with nonischemic cardiomyopathy.   ECHOCARDIOGRAM COMPLETE Result Date: 04/07/2023    ECHOCARDIOGRAM REPORT   Patient Name:   Kaimana Hal Neer Date of Exam: 04/07/2023 Medical Rec #:  [id number]        Height:       72.0 in Accession #:    [id number]       Weight:       196.0 lb  Date of Birth:  04-05-1955        BSA:          2.112 m Patient Age:    69 years         BP:           134/106 mmHg Patient Gender: M                HR:           71 bpm. Exam Location:  Inpatient Procedure: 2D Echo, Cardiac Doppler, Color Doppler and Intracardiac            Opacification Agent Indications:    I48.91* Unspeicified atrial fibrillation  History:        Patient has no prior history of Echocardiogram examinations. CHF  and Cardiomyopathy, Arrythmias:Atrial Flutter; Risk                 Factors:Hypertension and Diabetes.  Sonographer:    Webb Laws Referring Phys: [id number] ARSHAD N KAKRAKANDY IMPRESSIONS  1. Left ventricular ejection fraction, by estimation, is 30 to 35%. The left ventricle has moderately decreased function. The left ventricle demonstrates global hypokinesis. There is mild left ventricular hypertrophy. Left ventricular diastolic parameters are indeterminate.  2. Right ventricular systolic function is moderately reduced. The right ventricular size is mildly enlarged. There is mildly elevated pulmonary artery systolic pressure. The estimated right ventricular systolic pressure is 44.5 mmHg.  3. Left atrial size was mildly dilated.  4. The mitral valve is normal in structure. Mild mitral valve regurgitation. No evidence of mitral stenosis.  5. The aortic valve is tricuspid. Aortic valve regurgitation is trivial. No aortic stenosis is present.  6. Aortic dilatation noted. There is dilatation of the ascending aorta, measuring 40 mm.  7. The inferior vena cava is dilated in size with >50% respiratory variability, suggesting right atrial pressure of 8 mmHg. FINDINGS  Left Ventricle: Left ventricular ejection fraction, by estimation, is 30 to 35%. The left ventricle has moderately decreased function. The left ventricle demonstrates global hypokinesis. Definity contrast agent was given IV to delineate the left ventricular endocardial borders. The left ventricular internal cavity  size was normal in size. There is mild left ventricular hypertrophy. Left ventricular diastolic parameters are indeterminate. Right Ventricle: The right ventricular size is mildly enlarged. No increase in right ventricular wall thickness. Right ventricular systolic function is moderately reduced. There is mildly elevated pulmonary artery systolic pressure. The tricuspid regurgitant velocity is 3.02 m/s, and with an assumed right atrial pressure of 8 mmHg, the estimated right ventricular systolic pressure is 44.5 mmHg. Left Atrium: Left atrial size was mildly dilated. Right Atrium: Right atrial size was normal in size. Pericardium: There is no evidence of pericardial effusion. Mitral Valve: The mitral valve is normal in structure. Mild mitral valve regurgitation. No evidence of mitral valve stenosis. Tricuspid Valve: The tricuspid valve is normal in structure. Tricuspid valve regurgitation is mild. Aortic Valve: The aortic valve is tricuspid. Aortic valve regurgitation is trivial. No aortic stenosis is present. Pulmonic Valve: The pulmonic valve was not well visualized. Pulmonic valve regurgitation is trivial. Aorta: The aortic root is normal in size and structure and aortic dilatation noted. There is dilatation of the ascending aorta, measuring 40 mm. Venous: The inferior vena cava is dilated in size with greater than 50% respiratory variability, suggesting right atrial pressure of 8 mmHg. IAS/Shunts: The interatrial septum was not well visualized.  LEFT VENTRICLE PLAX 2D LVIDd:         5.70 cm      Diastology LVIDs:         4.80 cm      LV e' medial:    5.91 cm/s LV PW:         1.00 cm      LV E/e' medial:  19.8 LV IVS:        1.10 cm      LV e' lateral:   12.10 cm/s LVOT diam:     2.00 cm      LV E/e' lateral: 9.7 LV SV:         39 LV SV Index:   19 LVOT Area:     3.14 cm  LV Volumes (MOD) LV vol d, MOD A2C: 132.0 ml LV vol d, MOD A4C: 104.0  ml LV vol s, MOD A2C: 64.1 ml LV vol s, MOD A4C: 72.0 ml LV SV MOD A2C:      67.9 ml LV SV MOD A4C:     104.0 ml LV SV MOD BP:      48.3 ml RIGHT VENTRICLE            IVC RV Basal diam:  4.10 cm    IVC diam: 2.90 cm RV S prime:     8.70 cm/s TAPSE (M-mode): 1.9 cm LEFT ATRIUM             Index        RIGHT ATRIUM           Index LA diam:        4.50 cm 2.13 cm/m   RA Area:     19.45 cm LA Vol (A2C):   59.8 ml 28.31 ml/m  RA Volume:   50.80 ml  24.05 ml/m LA Vol (A4C):   94.8 ml 44.88 ml/m LA Biplane Vol: 77.4 ml 36.64 ml/m  AORTIC VALVE LVOT Vmax:   92.12 cm/s LVOT Vmean:  55.320 cm/s LVOT VTI:    0.125 m  AORTA Ao Root diam: 3.60 cm Ao Asc diam:  4.00 cm MITRAL VALVE                TRICUSPID VALVE MV Area (PHT): 5.97 cm     TR Peak grad:   36.5 mmHg MV Decel Time: 127 msec     TR Vmax:        302.00 cm/s MR Peak grad: 86.5 mmHg MR Mean grad: 59.0 mmHg     SHUNTS MR Vmax:      465.00 cm/s   Systemic VTI:  0.13 m MR Vmean:     361.0 cm/s    Systemic Diam: 2.00 cm MV E velocity: 117.00 cm/s Epifanio Lesches MD Electronically signed by Epifanio Lesches MD Signature Date/Time: 04/07/2023/11:50:24 AM    Final    DG Chest 2 View Result Date: 04/06/2023 CLINICAL DATA:  Dyspnea EXAM: CHEST - 2 VIEW COMPARISON:  07/14/2022 FINDINGS: The lungs are symmetrically well expanded. There is a perihilar interstitial thickening again seen in keeping with changes of mild interstitial pulmonary edema or airway inflammation. No confluent pulmonary infiltrate. No pneumothorax or pleural effusion. Cardiac size within normal limits. Pulmonary vascularity is normal. IMPRESSION: 1. Perihilar interstitial thickening in keeping with changes of mild interstitial pulmonary edema or airway inflammation. Electronically Signed   By: Helyn Numbers M.D.   On: 04/06/2023 23:46      Subjective: Patient seen and examined at bedside today.  Hemodynamically stable lying in bed.  On room air.  Denies any shortness of breath or cough today.  Rate is well-controlled.  Patient seen by PT and he did well, no  follow-up recommended.  Medically stable for discharge home today.  Discharge Exam: Vitals:   04/10/23 0831 04/10/23 0900  BP: 129/71   Pulse: (!) 55 (!) 58  Resp: 16   Temp: 98.1 F (36.7 C)   SpO2: 97% 97%   Vitals:   04/09/23 2345 04/10/23 0515 04/10/23 0831 04/10/23 0900  BP: 121/79 128/70 129/71   Pulse: (!) 57 (!) 59 (!) 55 (!) 58  Resp: 20 17 16    Temp: 98 F (36.7 C) 97.9 F (36.6 C) 98.1 F (36.7 C)   TempSrc: Oral Oral Oral   SpO2: 98% 100% 97% 97%  Weight:      Height:  General: Pt is alert, awake, not in acute distress Cardiovascular: irregularly  irregular rhythm, S1/S2 +, no rubs, no gallops Respiratory: CTA bilaterally, no wheezing, no rhonchi Abdominal: Soft, NT, ND, bowel sounds + Extremities: no edema, no cyanosis, left upper extremity AV fistula    The results of significant diagnostics from this hospitalization (including imaging, microbiology, ancillary and laboratory) are listed below for reference.     Microbiology: Recent Results (from the past 240 hours)  Resp panel by RT-PCR (RSV, Flu A&B, Covid) Anterior Nasal Swab     Status: Abnormal   Collection Time: 04/06/23 11:43 PM   Specimen: Anterior Nasal Swab  Result Value Ref Range Status   SARS Coronavirus 2 by RT PCR NEGATIVE NEGATIVE Final   Influenza A by PCR NEGATIVE NEGATIVE Final   Influenza B by PCR NEGATIVE NEGATIVE Final    Comment: (NOTE) The Xpert Xpress SARS-CoV-2/FLU/RSV plus assay is intended as an aid in the diagnosis of influenza from Nasopharyngeal swab specimens and should not be used as a sole basis for treatment. Nasal washings and aspirates are unacceptable for Xpert Xpress SARS-CoV-2/FLU/RSV testing.  Fact Sheet for Patients: BloggerCourse.com  Fact Sheet for Healthcare Providers: SeriousBroker.it  This test is not yet approved or cleared by the [place] FDA and has been authorized for detection and/or  diagnosis of SARS-CoV-2 by FDA under an Emergency Use Authorization (EUA). This EUA will remain in effect (meaning this test can be used) for the duration of the COVID-19 declaration under Section 564(b)(1) of the Act, 21 U.S.C. section 360bbb-3(b)(1), unless the authorization is terminated or revoked.     Resp Syncytial Virus by PCR POSITIVE (A) NEGATIVE Final    Comment: (NOTE) Fact Sheet for Patients: BloggerCourse.com  Fact Sheet for Healthcare Providers: SeriousBroker.it  This test is not yet approved or cleared by the [place] FDA and has been authorized for detection and/or diagnosis of SARS-CoV-2 by FDA under an Emergency Use Authorization (EUA). This EUA will remain in effect (meaning this test can be used) for the duration of the COVID-19 declaration under Section 564(b)(1) of the Act, 21 U.S.C. section 360bbb-3(b)(1), unless the authorization is terminated or revoked.  Performed at [facility], [address] [place], [place] [address]      Labs: BNP (last 3 results) Recent Labs    06/29/22 0303 04/06/23 2113  BNP 2,310.7* >4,500.0*   Basic Metabolic Panel: Recent Labs  Lab 04/06/23 2113 04/07/23 0437 04/08/23 0657 04/08/23 1703 04/08/23 1707 04/09/23 0439  NA 137 135 132* 135 135  135 135  K 3.5 3.2* 5.0 3.9 3.9  3.9 4.1  CL 98 99 96*  --   --  94*  CO2 22 25 20*  --   --  25  GLUCOSE 103* 88 93  --   --  77  BUN 47* 45* 60*  --   --  47*  CREATININE 7.86* 7.79* 9.23*  --   --  7.26*  CALCIUM 8.3* 7.8* 8.3*  --   --  8.4*   Liver Function Tests: Recent Labs  Lab 04/07/23 0437  AST 21  ALT 20  ALKPHOS 63  BILITOT 1.8*  PROT 7.2  ALBUMIN 2.5*   No results for input(s): "LIPASE", "AMYLASE" in the last 168 hours. No results for input(s): "AMMONIA" in the last 168 hours. CBC: Recent Labs  Lab 04/06/23 2113 04/07/23 0437 04/08/23 0657 04/08/23 1703 04/08/23 1707  04/09/23 0439  WBC 7.4 7.2 8.4  --   --  6.5  HGB 10.2* 9.5* 11.0* 12.2* 12.2*  11.9* 9.8*  HCT 30.6* 28.6* 33.0* 36.0* 36.0*  35.0* 29.5*  MCV 87.9 87.7 88.2  --   --  87.3  PLT 168 160 179  --   --  162   Cardiac Enzymes: No results for input(s): "CKTOTAL", "CKMB", "CKMBINDEX", "TROPONINI" in the last 168 hours. BNP: Invalid input(s): "POCBNP" CBG: No results for input(s): "GLUCAP" in the last 168 hours. D-Dimer No results for input(s): "DDIMER" in the last 72 hours. Hgb A1c No results for input(s): "HGBA1C" in the last 72 hours. Lipid Profile Recent Labs    04/08/23 0657  CHOL 90  HDL 34*  LDLCALC 45  TRIG 53  CHOLHDL 2.6   Thyroid function studies No results for input(s): "TSH", "T4TOTAL", "T3FREE", "THYROIDAB" in the last 72 hours.  Invalid input(s): "FREET3" Anemia work up No results for input(s): "VITAMINB12", "FOLATE", "FERRITIN", "TIBC", "IRON", "RETICCTPCT" in the last 72 hours. Urinalysis No results found for: "COLORURINE", "APPEARANCEUR", "LABSPEC", "PHURINE", "GLUCOSEU", "HGBUR", "BILIRUBINUR", "KETONESUR", "PROTEINUR", "UROBILINOGEN", "NITRITE", "LEUKOCYTESUR" Sepsis Labs Recent Labs  Lab 04/06/23 2113 04/07/23 0437 04/08/23 0657 04/09/23 0439  WBC 7.4 7.2 8.4 6.5   Microbiology Recent Results (from the past 240 hours)  Resp panel by RT-PCR (RSV, Flu A&B, Covid) Anterior Nasal Swab     Status: Abnormal   Collection Time: 04/06/23 11:43 PM   Specimen: Anterior Nasal Swab  Result Value Ref Range Status   SARS Coronavirus 2 by RT PCR NEGATIVE NEGATIVE Final   Influenza A by PCR NEGATIVE NEGATIVE Final   Influenza B by PCR NEGATIVE NEGATIVE Final    Comment: (NOTE) The Xpert Xpress SARS-CoV-2/FLU/RSV plus assay is intended as an aid in the diagnosis of influenza from Nasopharyngeal swab specimens and should not be used as a sole basis for treatment. Nasal washings and aspirates are unacceptable for Xpert Xpress SARS-CoV-2/FLU/RSV testing.  Fact  Sheet for Patients: BloggerCourse.com  Fact Sheet for Healthcare Providers: SeriousBroker.it  This test is not yet approved or cleared by the [place] FDA and has been authorized for detection and/or diagnosis of SARS-CoV-2 by FDA under an Emergency Use Authorization (EUA). This EUA will remain in effect (meaning this test can be used) for the duration of the COVID-19 declaration under Section 564(b)(1) of the Act, 21 U.S.C. section 360bbb-3(b)(1), unless the authorization is terminated or revoked.     Resp Syncytial Virus by PCR POSITIVE (A) NEGATIVE Final    Comment: (NOTE) Fact Sheet for Patients: BloggerCourse.com  Fact Sheet for Healthcare Providers: SeriousBroker.it  This test is not yet approved or cleared by the [place] FDA and has been authorized for detection and/or diagnosis of SARS-CoV-2 by FDA under an Emergency Use Authorization (EUA). This EUA will remain in effect (meaning this test can be used) for the duration of the COVID-19 declaration under Section 564(b)(1) of the Act, 21 U.S.C. section 360bbb-3(b)(1), unless the authorization is terminated or revoked.  Performed at [facility], [address] [place], [place] [address]     Please note: You were cared for by a hospitalist during your hospital stay. Once you are discharged, your primary care physician will handle any further medical issues. Please note that NO REFILLS for any discharge medications will be authorized once you are discharged, as it is imperative that you return to your primary care physician (or establish a relationship with a primary care physician if you do not have one) for your post hospital discharge needs so that  they can reassess your need for medications and monitor your lab values.    Time coordinating discharge: 40 minutes  SIGNED:   Burnadette Pop, MD  [facility] 04/10/2023, 10:30 AM Pager [phone]  If 7PM-7AM, please contact night-coverage [email or web address] Password [email or web address]

## 2023-04-11 LAB — RHEUMATOID FACTOR: Rheumatoid fact SerPl-aCnc: 10.6 [IU]/mL (ref <14.0)

## 2023-04-12 ENCOUNTER — Telehealth: Payer: Self-pay

## 2023-04-12 MED FILL — Heparin Sodium (Porcine) Inj 1000 Unit/ML: INTRAMUSCULAR | Qty: 10 | Status: AC

## 2023-04-12 NOTE — Telephone Encounter (Signed)
-----   Message from Armanda Magic sent at 04/11/2023  1:10 PM EST ----- No Pulmonary embolism

## 2023-04-12 NOTE — Progress Notes (Signed)
Late Note entry- Apr 12, 2023  Contacted [facility] this morning to be advised of pt's d/c date and that pt should have resumed this morning. Clinic states pt did not come to treatment this morning. D/C summary and last renal note faxed to clinic for continuation of care.   Olivia Canter Renal Navigator [phone]

## 2023-04-12 NOTE — Telephone Encounter (Signed)
Call to patient to discuss results, no answer and no VM set up. No active [facility] account.

## 2023-04-13 LAB — LIPOPROTEIN A (LPA): Lipoprotein (a): 27.2 nmol/L (ref <75.0)

## 2023-04-21 NOTE — Progress Notes (Deleted)
   Cardiology Office Note    Date:  04/21/2023  ID:  Millan, Legan 1955/03/23, MRN [id number] PCP:  Douglass Gerard DEL, PA-C  Cardiologist:  Wilbert Bihari, MD  Electrophysiologist:  None   Chief Complaint: ***  History of Present Illness: .    Jacob Daniel is a 68 y.o. male with visit-pertinent history of chronic HFrEF/NICM ESRD on hemodialysis on Mon/Wed/Fri, failed renal transplant takes Prograf , prior nonadherence with medications/behavioral concerns, hypertension, hyperlipidemia, chronic anemia, recently diagnosed atrial flutter in setting of RSV, coronary calcification, mild MR, mild dilation of ascending aorta.   He had prior workup in [facility] -  EF last known to be normal 05/2022; echo 07/2022 EF 40% with nuc at that time showing mild inferolateral hypokinesia, fixed inferior wall defect, EF 51%, then repeat echo 10/2022 EF 35-40% with moderate-severe TR, mild-moderate MR - cath advised, patient preferred echo follow-up instead, lost to f/u after. He was previously discharged from the [facility] dialysis center due to behavior. He was recently admitted to [facility] 03/2023 with worsening SOB/wheezing, recent mechanical fall, found to have atrial fib RVR by EMS report, here in Afib/flutter. Resp viral panel + RSV. Troponin was mildly elevated. 2d echo showed EF 30-35%, mild LVH, global HK, moderately reduced RV function, mild pHTN, mild MR, mild dilation of ascending aorta. R/LHC above severe diffuse coronary calcification but no significant proximal vessel disease, severe pHTN with severely elevated LVEDP, severe RV dysfunction. He spontaneously converted to NSR though was in and out, rate controlled, on admission. Dr. Bihari recommended f/u with gen cards and AHF clinic.  Cath site OK? Chaperone? Sees stoner 2/10  Chronic HFrEF, severe pHTN, RV dysfunction, HTN with ESRD on HD Coronary artery disease Paroxysmal atrial fib/flutter Mild MR, mild dilation of ascending aorta  Labwork  independently reviewed: 03/2023 K 4.1, Cr 7.26, Hgb 9.8, plt 162, LDL 45, trig 53, TSH OK  ROS: .    Please see the history of present illness. Otherwise, review of systems is positive for ***.  All other systems are reviewed and otherwise negative.  Studies Reviewed: SABRA    EKG:  EKG is ordered today, personally reviewed, demonstrating ***  CV Studies: Cardiac studies reviewed are outlined and summarized above. Otherwise please see EMR for full report.   Current Reported Medications:.    No outpatient medications have been marked as taking for the 04/22/23 encounter (Appointment) with Dolphus Linch N, PA-C.    Physical Exam:    VS:  There were no vitals taken for this visit.   Wt Readings from Last 3 Encounters:  04/09/23 192 lb 7.4 oz (87.3 kg)  01/27/23 196 lb (88.9 kg)  06/29/22 195 lb 8.8 oz (88.7 kg)    GEN: Well nourished, well developed in no acute distress NECK: No JVD; No carotid bruits CARDIAC: ***RRR, no murmurs, rubs, gallops RESPIRATORY:  Clear to auscultation without rales, wheezing or rhonchi  ABDOMEN: Soft, non-tender, non-distended EXTREMITIES:  No edema; No acute deformity   Asessement and Plan:.     ***     Disposition: F/u with ***  Signed, Jansen Sciuto N Twyla Dais, PA-C

## 2023-04-22 ENCOUNTER — Ambulatory Visit: Payer: 59 | Admitting: Physician Assistant

## 2023-04-22 DIAGNOSIS — I340 Nonrheumatic mitral (valve) insufficiency: Secondary | ICD-10-CM

## 2023-04-22 DIAGNOSIS — I4892 Unspecified atrial flutter: Secondary | ICD-10-CM

## 2023-04-22 DIAGNOSIS — I2510 Atherosclerotic heart disease of native coronary artery without angina pectoris: Secondary | ICD-10-CM

## 2023-04-22 DIAGNOSIS — I272 Pulmonary hypertension, unspecified: Secondary | ICD-10-CM

## 2023-04-22 DIAGNOSIS — I77810 Thoracic aortic ectasia: Secondary | ICD-10-CM

## 2023-04-22 DIAGNOSIS — I5022 Chronic systolic (congestive) heart failure: Secondary | ICD-10-CM

## 2023-04-22 DIAGNOSIS — I480 Paroxysmal atrial fibrillation: Secondary | ICD-10-CM

## 2023-04-23 ENCOUNTER — Telehealth (HOSPITAL_COMMUNITY): Payer: Self-pay | Admitting: Cardiology

## 2023-04-26 ENCOUNTER — Ambulatory Visit (HOSPITAL_COMMUNITY): Payer: 59 | Admitting: Cardiology

## 2023-05-03 ENCOUNTER — Telehealth: Payer: Self-pay

## 2023-05-03 NOTE — Telephone Encounter (Signed)
-----   Message from Armanda Magic sent at 04/11/2023  1:10 PM EST ----- No Pulmonary embolism

## 2023-05-03 NOTE — Telephone Encounter (Signed)
 Call to patient to give results. No answer, unable to leave VM. Does not have [facility] account. Letter sent.

## 2023-05-04 ENCOUNTER — Inpatient Hospital Stay (HOSPITAL_COMMUNITY)
Admission: EM | Admit: 2023-05-04 | Discharge: 2023-05-08 | DRG: 291 | Disposition: A | Discharge status: Home / Self Care | Payer: 59 | Attending: Internal Medicine | Admitting: Internal Medicine

## 2023-05-04 ENCOUNTER — Other Ambulatory Visit: Payer: Self-pay

## 2023-05-04 ENCOUNTER — Emergency Department (HOSPITAL_COMMUNITY): Payer: 59

## 2023-05-04 ENCOUNTER — Encounter (HOSPITAL_COMMUNITY): Payer: Self-pay | Admitting: Emergency Medicine

## 2023-05-04 DIAGNOSIS — J9601 Acute respiratory failure with hypoxia: Principal | ICD-10-CM

## 2023-05-04 DIAGNOSIS — Z8709 Personal history of other diseases of the respiratory system: Secondary | ICD-10-CM

## 2023-05-04 DIAGNOSIS — D72819 Decreased white blood cell count, unspecified: Secondary | ICD-10-CM | POA: Diagnosis present

## 2023-05-04 DIAGNOSIS — M1990 Unspecified osteoarthritis, unspecified site: Secondary | ICD-10-CM | POA: Diagnosis present

## 2023-05-04 DIAGNOSIS — E1122 Type 2 diabetes mellitus with diabetic chronic kidney disease: Secondary | ICD-10-CM | POA: Diagnosis present

## 2023-05-04 DIAGNOSIS — R0600 Dyspnea, unspecified: Secondary | ICD-10-CM | POA: Insufficient documentation

## 2023-05-04 DIAGNOSIS — I5022 Chronic systolic (congestive) heart failure: Secondary | ICD-10-CM | POA: Diagnosis present

## 2023-05-04 DIAGNOSIS — I132 Hypertensive heart and chronic kidney disease with heart failure and with stage 5 chronic kidney disease, or end stage renal disease: Principal | ICD-10-CM | POA: Diagnosis present

## 2023-05-04 DIAGNOSIS — Z7901 Long term (current) use of anticoagulants: Secondary | ICD-10-CM

## 2023-05-04 DIAGNOSIS — Z8619 Personal history of other infectious and parasitic diseases: Secondary | ICD-10-CM

## 2023-05-04 DIAGNOSIS — I272 Pulmonary hypertension, unspecified: Secondary | ICD-10-CM | POA: Diagnosis present

## 2023-05-04 DIAGNOSIS — I491 Atrial premature depolarization: Secondary | ICD-10-CM | POA: Diagnosis present

## 2023-05-04 DIAGNOSIS — Z1152 Encounter for screening for COVID-19: Secondary | ICD-10-CM | POA: Diagnosis not present

## 2023-05-04 DIAGNOSIS — Z860100 Personal history of colon polyps, unspecified: Secondary | ICD-10-CM

## 2023-05-04 DIAGNOSIS — I5023 Acute on chronic systolic (congestive) heart failure: Secondary | ICD-10-CM | POA: Diagnosis present

## 2023-05-04 DIAGNOSIS — J9621 Acute and chronic respiratory failure with hypoxia: Secondary | ICD-10-CM | POA: Diagnosis present

## 2023-05-04 DIAGNOSIS — T39016A Underdosing of aspirin, initial encounter: Secondary | ICD-10-CM | POA: Diagnosis present

## 2023-05-04 DIAGNOSIS — D631 Anemia in chronic kidney disease: Secondary | ICD-10-CM | POA: Diagnosis present

## 2023-05-04 DIAGNOSIS — T8612 Kidney transplant failure: Secondary | ICD-10-CM | POA: Diagnosis present

## 2023-05-04 DIAGNOSIS — I2510 Atherosclerotic heart disease of native coronary artery without angina pectoris: Secondary | ICD-10-CM | POA: Diagnosis present

## 2023-05-04 DIAGNOSIS — I10 Essential (primary) hypertension: Secondary | ICD-10-CM | POA: Diagnosis not present

## 2023-05-04 DIAGNOSIS — I5082 Biventricular heart failure: Secondary | ICD-10-CM | POA: Diagnosis present

## 2023-05-04 DIAGNOSIS — J9611 Chronic respiratory failure with hypoxia: Secondary | ICD-10-CM | POA: Diagnosis not present

## 2023-05-04 DIAGNOSIS — J90 Pleural effusion, not elsewhere classified: Secondary | ICD-10-CM | POA: Diagnosis present

## 2023-05-04 DIAGNOSIS — E7849 Other hyperlipidemia: Secondary | ICD-10-CM | POA: Diagnosis not present

## 2023-05-04 DIAGNOSIS — N261 Atrophy of kidney (terminal): Secondary | ICD-10-CM | POA: Diagnosis present

## 2023-05-04 DIAGNOSIS — Z9889 Other specified postprocedural states: Secondary | ICD-10-CM

## 2023-05-04 DIAGNOSIS — Z91148 Patient's other noncompliance with medication regimen for other reason: Secondary | ICD-10-CM

## 2023-05-04 DIAGNOSIS — E8770 Fluid overload, unspecified: Secondary | ICD-10-CM | POA: Diagnosis not present

## 2023-05-04 DIAGNOSIS — N281 Cyst of kidney, acquired: Secondary | ICD-10-CM | POA: Diagnosis present

## 2023-05-04 DIAGNOSIS — N2581 Secondary hyperparathyroidism of renal origin: Secondary | ICD-10-CM | POA: Diagnosis present

## 2023-05-04 DIAGNOSIS — Y830 Surgical operation with transplant of whole organ as the cause of abnormal reaction of the patient, or of later complication, without mention of misadventure at the time of the procedure: Secondary | ICD-10-CM | POA: Diagnosis present

## 2023-05-04 DIAGNOSIS — E876 Hypokalemia: Secondary | ICD-10-CM | POA: Insufficient documentation

## 2023-05-04 DIAGNOSIS — R34 Anuria and oliguria: Secondary | ICD-10-CM | POA: Diagnosis present

## 2023-05-04 DIAGNOSIS — I480 Paroxysmal atrial fibrillation: Secondary | ICD-10-CM | POA: Diagnosis not present

## 2023-05-04 DIAGNOSIS — R188 Other ascites: Secondary | ICD-10-CM | POA: Diagnosis present

## 2023-05-04 DIAGNOSIS — G4733 Obstructive sleep apnea (adult) (pediatric): Secondary | ICD-10-CM | POA: Diagnosis present

## 2023-05-04 DIAGNOSIS — I493 Ventricular premature depolarization: Secondary | ICD-10-CM | POA: Diagnosis present

## 2023-05-04 DIAGNOSIS — N186 End stage renal disease: Secondary | ICD-10-CM | POA: Diagnosis present

## 2023-05-04 DIAGNOSIS — R9431 Abnormal electrocardiogram [ECG] [EKG]: Secondary | ICD-10-CM

## 2023-05-04 DIAGNOSIS — E785 Hyperlipidemia, unspecified: Secondary | ICD-10-CM

## 2023-05-04 DIAGNOSIS — Z8679 Personal history of other diseases of the circulatory system: Secondary | ICD-10-CM

## 2023-05-04 DIAGNOSIS — Z8701 Personal history of pneumonia (recurrent): Secondary | ICD-10-CM

## 2023-05-04 DIAGNOSIS — M898X9 Other specified disorders of bone, unspecified site: Secondary | ICD-10-CM | POA: Diagnosis present

## 2023-05-04 DIAGNOSIS — Z79899 Other long term (current) drug therapy: Secondary | ICD-10-CM

## 2023-05-04 DIAGNOSIS — R5381 Other malaise: Secondary | ICD-10-CM | POA: Diagnosis present

## 2023-05-04 DIAGNOSIS — Z7982 Long term (current) use of aspirin: Secondary | ICD-10-CM

## 2023-05-04 DIAGNOSIS — Z992 Dependence on renal dialysis: Secondary | ICD-10-CM | POA: Diagnosis not present

## 2023-05-04 HISTORY — DX: Paroxysmal atrial fibrillation: I48.0

## 2023-05-04 HISTORY — DX: Fluid overload, unspecified: E87.70

## 2023-05-04 HISTORY — DX: Hyperlipidemia, unspecified: E78.5

## 2023-05-04 LAB — CBC
HCT: 30.9 % — ABNORMAL LOW (ref 39.0–52.0)
Hemoglobin: 10.1 g/dL — ABNORMAL LOW (ref 13.0–17.0)
MCH: 29.4 pg (ref 26.0–34.0)
MCHC: 32.7 g/dL (ref 30.0–36.0)
MCV: 89.8 fL (ref 80.0–100.0)
Platelets: 136 10*3/uL — ABNORMAL LOW (ref 150–400)
RBC: 3.44 MIL/uL — ABNORMAL LOW (ref 4.22–5.81)
RDW: 17.2 % — ABNORMAL HIGH (ref 11.5–15.5)
WBC: 3.4 10*3/uL — ABNORMAL LOW (ref 4.0–10.5)
nRBC: 0 % (ref 0.0–0.2)

## 2023-05-04 LAB — COMPREHENSIVE METABOLIC PANEL
ALT: 15 U/L (ref 0–44)
AST: 16 U/L (ref 15–41)
Albumin: 2.5 g/dL — ABNORMAL LOW (ref 3.5–5.0)
Alkaline Phosphatase: 86 U/L (ref 38–126)
Anion gap: 18 — ABNORMAL HIGH (ref 5–15)
BUN: 52 mg/dL — ABNORMAL HIGH (ref 8–23)
CO2: 23 mmol/L (ref 22–32)
Calcium: 8.1 mg/dL — ABNORMAL LOW (ref 8.9–10.3)
Chloride: 97 mmol/L — ABNORMAL LOW (ref 98–111)
Creatinine, Ser: 8.67 mg/dL — ABNORMAL HIGH (ref 0.61–1.24)
GFR, Estimated: 6 mL/min — ABNORMAL LOW (ref >60)
Glucose, Bld: 92 mg/dL (ref 70–99)
Potassium: 4.1 mmol/L (ref 3.5–5.1)
Sodium: 138 mmol/L (ref 135–145)
Total Bilirubin: 1.8 mg/dL — ABNORMAL HIGH (ref 0.0–1.2)
Total Protein: 7.7 g/dL (ref 6.5–8.1)

## 2023-05-04 LAB — RESP PANEL BY RT-PCR (RSV, FLU A&B, COVID)  RVPGX2
Influenza A by PCR: NEGATIVE
Influenza B by PCR: NEGATIVE
Resp Syncytial Virus by PCR: NEGATIVE
SARS Coronavirus 2 by RT PCR: NEGATIVE

## 2023-05-04 MED ORDER — FERROUS SULFATE 325 (65 FE) MG PO TABS
325.0000 mg | ORAL_TABLET | Freq: Every day | ORAL | Status: DC
  Administered 2023-05-05 – 2023-05-08 (×4): 325 mg via ORAL
  Filled 2023-05-04 (×4): qty 1

## 2023-05-04 MED ORDER — ACETAMINOPHEN 650 MG RE SUPP: 650.0000 mg | Freq: Four times a day (QID) | RECTAL | Status: DC | PRN

## 2023-05-04 MED ORDER — IRBESARTAN 150 MG PO TABS
300.0000 mg | ORAL_TABLET | Freq: Every day | ORAL | Status: DC
  Administered 2023-05-05 – 2023-05-08 (×4): 300 mg via ORAL
  Filled 2023-05-04 (×4): qty 2

## 2023-05-04 MED ORDER — SODIUM CHLORIDE 0.9% FLUSH: 3.0000 mL | INTRAVENOUS | Status: DC | PRN

## 2023-05-04 MED ORDER — SODIUM CHLORIDE 0.9% FLUSH
3.0000 mL | Freq: Two times a day (BID) | INTRAVENOUS | Status: DC
  Administered 2023-05-04 – 2023-05-05 (×2): 3 mL via INTRAVENOUS

## 2023-05-04 MED ORDER — IPRATROPIUM-ALBUTEROL 0.5-2.5 (3) MG/3ML IN SOLN: 3.0000 mL | Freq: Four times a day (QID) | RESPIRATORY_TRACT | Status: DC | PRN

## 2023-05-04 MED ORDER — HYDROMORPHONE HCL 1 MG/ML IJ SOLN
0.5000 mg | Freq: Once | INTRAMUSCULAR | Status: AC
  Administered 2023-05-04: 0.5 mg via INTRAVENOUS
  Filled 2023-05-04: qty 1

## 2023-05-04 MED ORDER — DOCUSATE SODIUM 100 MG PO CAPS
100.0000 mg | ORAL_CAPSULE | Freq: Two times a day (BID) | ORAL | Status: DC
  Administered 2023-05-04 – 2023-05-08 (×7): 100 mg via ORAL
  Filled 2023-05-04 (×8): qty 1

## 2023-05-04 MED ORDER — CALCIUM ACETATE (PHOS BINDER) 667 MG PO CAPS
2001.0000 mg | ORAL_CAPSULE | Freq: Three times a day (TID) | ORAL | Status: DC
  Administered 2023-05-05 – 2023-05-08 (×7): 2001 mg via ORAL
  Filled 2023-05-04 (×8): qty 3

## 2023-05-04 MED ORDER — CHLORHEXIDINE GLUCONATE CLOTH 2 % EX PADS: 6.0000 | MEDICATED_PAD | Freq: Every day | CUTANEOUS | Status: DC

## 2023-05-04 MED ORDER — ASPIRIN 81 MG PO TBEC
81.0000 mg | DELAYED_RELEASE_TABLET | Freq: Every day | ORAL | Status: DC
  Administered 2023-05-05 – 2023-05-08 (×4): 81 mg via ORAL
  Filled 2023-05-04 (×4): qty 1

## 2023-05-04 MED ORDER — ONDANSETRON HCL 4 MG PO TABS: 4.0000 mg | ORAL_TABLET | Freq: Four times a day (QID) | ORAL | Status: DC | PRN

## 2023-05-04 MED ORDER — SODIUM CHLORIDE 0.9 % IV SOLN: 250.0000 mL | INTRAVENOUS | Status: AC | PRN

## 2023-05-04 MED ORDER — SODIUM CHLORIDE 0.9% FLUSH
3.0000 mL | Freq: Two times a day (BID) | INTRAVENOUS | Status: DC
  Administered 2023-05-04 – 2023-05-07 (×5): 3 mL via INTRAVENOUS

## 2023-05-04 MED ORDER — TACROLIMUS 1 MG PO CAPS
1.0000 mg | ORAL_CAPSULE | Freq: Every day | ORAL | Status: DC
  Administered 2023-05-04 – 2023-05-08 (×5): 1 mg via ORAL
  Filled 2023-05-04 (×5): qty 1

## 2023-05-04 MED ORDER — SODIUM BICARBONATE 650 MG PO TABS
650.0000 mg | ORAL_TABLET | Freq: Two times a day (BID) | ORAL | Status: DC
  Administered 2023-05-04 – 2023-05-08 (×7): 650 mg via ORAL
  Filled 2023-05-04 (×8): qty 1

## 2023-05-04 MED ORDER — ACETAMINOPHEN 325 MG PO TABS
650.0000 mg | ORAL_TABLET | Freq: Four times a day (QID) | ORAL | Status: DC | PRN
  Administered 2023-05-05: 650 mg via ORAL
  Filled 2023-05-04: qty 2

## 2023-05-04 MED ORDER — APIXABAN 5 MG PO TABS
5.0000 mg | ORAL_TABLET | Freq: Two times a day (BID) | ORAL | Status: DC
  Administered 2023-05-04 – 2023-05-08 (×7): 5 mg via ORAL
  Filled 2023-05-04 (×8): qty 1

## 2023-05-04 MED ORDER — ONDANSETRON HCL 4 MG/2ML IJ SOLN: 4.0000 mg | Freq: Four times a day (QID) | INTRAMUSCULAR | Status: DC | PRN

## 2023-05-04 MED ORDER — ATORVASTATIN CALCIUM 10 MG PO TABS
10.0000 mg | ORAL_TABLET | Freq: Every day | ORAL | Status: DC
  Administered 2023-05-04 – 2023-05-08 (×5): 10 mg via ORAL
  Filled 2023-05-04 (×5): qty 1

## 2023-05-04 MED ORDER — METOPROLOL SUCCINATE ER 50 MG PO TB24
75.0000 mg | ORAL_TABLET | Freq: Every day | ORAL | Status: DC
  Administered 2023-05-05 – 2023-05-08 (×4): 75 mg via ORAL
  Filled 2023-05-04 (×4): qty 1

## 2023-05-04 NOTE — H&P (Signed)
 History and Physical    Jacob Daniel NWG:[id number] DOB: 1955-05-04 DOA: 05/04/2023  PCP: Pcp, No   Patient coming from: Home   Chief Complaint:  Chief Complaint  Patient presents with   Abdominal Pain   Shortness of Breath   ED TRIAGE note:Pt BIB EMS for ABD distention and SOB. ON Dialysis M/W/F. Completed last treatment with no issues. On 2 L via [place], not on baseline oxygen. EMS denies hypoxia en route. Denies N/V/D. Pt denies CP.   HPI:  Jacob Daniel is a 68 y.o. male with medical history significant of history of CAD, ESRD on dialysis MWF schedule, failed renal transplant, essential hypertension, hyperlipidemia, chronic normocytic anemia, atrial fibrillation on Eliquis and metoprolol, heart failure with reduced ejection fraction 35% with global hypokinesis, pulmonary hypertension, hyperlipidemia, chronic hypokalemia, chronic physical debility and weakness and concern for obstructive sleep apnea presented to emergency department abdominal distention and shortness of breath.  Complaining about generalized abdominal pain.  Patient reported that he has been compliant with dialysis and completed dialysis yesterday 04/7023.  Patient denies any fever, chills, nausea, vomiting and diarrhea. Patient is also complaining about bilateral lower extremity swelling and shortness of breath which has been worsening compared to his baseline.  Patient reported that usually he gets abdominal distention, comes to the hospital receive dialysis treatment and it gets better.  Chart review patient has a heart cath in January 2025 which showed 90% LAD stenosis lesion, proximal RCA and right RCA stenosis, severe pulmonary hypertension.   ED Course:  On presentation to ED per patient found to have elevated blood pressure 163/105 O2 sat between 90% to 100% on 2 L oxygen.  Otherwise hemodynamically stable. CBC showing low WBC count 3.4, stable H&H 10.1 and 30.9 and low platelet count of 135. CMP showing low  chloride 97, BUN 52, creatinine 8.6-calcium 8.1 low albumin 2.5 elevated total bilirubin 1.8.  Elevated anion gap 18. Respiratory panel negative for COVID, flu RSV.  EKG showing normal sinus rhythm with premature atrial complex, heart rate 93 and premature ventricular complex which leading to false impression of ST-T wave depression in V4-V6.  Chest x-ray showed bilateral perihilar interstitial prominence suggestive of interstitial pulmonary edema.  No focal consolidation.  CT abdomen pelvis showed: IMPRESSION: 1. Moderate to large volume abdominopelvic ascites. 2. Questionable but not definite capsular nodularity of the liver, recommend correlation for cirrhosis risk factors. 3. Polycystic appearance of the kidneys. Right lower quadrant renal transplant which appears atrophic. 4. Small bilateral pleural effusions. Features of pulmonary edema in the lung bases.  ED physician Dr. Suezanne Jacquet has been consulted and spoke with nephrology Dr. Glenna Fellows plan for dialysis tomorrow morning.  Hospitalist has been contacted for admission for management of volume overload and acute on chronic dyspnea.  Discussed the EKG finding with cardiology.  Cardiology reviewed the EKG which shows that patient has some PVC which is causing falls interpretation of ST depression in V4-V6.  Significant labs in the ED: Lab Orders         Resp panel by RT-PCR (RSV, Flu A&B, Covid) Anterior Nasal Swab         Comprehensive metabolic panel         CBC         Hepatitis B surface antigen         Hepatitis B surface antibody,quantitative         Brain natriuretic peptide         CBC  Comprehensive metabolic panel         Protime-INR         APTT       Review of Systems:  ROS  Past Medical History:  Diagnosis Date   Arthritis    CHF (congestive heart failure) (HCC)    Diabetes mellitus without complication (HCC)    ESRD on hemodialysis (HCC)    Hypertension     Past Surgical History:  Procedure  Laterality Date   KIDNEY TRANSPLANT  2010   RIGHT/LEFT HEART CATH AND CORONARY ANGIOGRAPHY N/A 04/08/2023   Procedure: RIGHT/LEFT HEART CATH AND CORONARY ANGIOGRAPHY;  Surgeon: Yates Decamp, MD;  [facility]: [facility];  Service: Cardiovascular;  Laterality: N/A;   SPINE SURGERY       reports that he has never smoked. He has never used smokeless tobacco. He reports that he does not currently use alcohol after a past usage of about 1.0 standard drink of alcohol per week. He reports current drug use. Drug: Marijuana.  No Known Allergies  History reviewed. No pertinent family history.  Prior to Admission medications   Medication Sig Start Date End Date Taking? Authorizing Provider  acetaminophen-codeine (TYLENOL #2) 300-15 MG tablet Take 1 tablet by mouth every 8 (eight) hours as needed for moderate pain (pain score 4-6). 02/25/23  Yes [provider]  albuterol (ACCUNEB) 0.63 MG/3ML nebulizer solution Take 1 ampule by nebulization every 6 (six) hours as needed for wheezing.   Yes [provider]  albuterol (VENTOLIN HFA) 108 (90 Base) MCG/ACT inhaler Inhale 2 puffs into the lungs every 6 (six) hours as needed for wheezing.   Yes [provider]  apixaban (ELIQUIS) 5 MG TABS tablet Take 1 tablet (5 mg total) by mouth 2 (two) times daily. 04/10/23  Yes Burnadette Pop, MD  atorvastatin (LIPITOR) 10 MG tablet Take 1 tablet (10 mg total) by mouth daily. 04/10/23 01/13/27 Yes Burnadette Pop, MD  calcium acetate (PHOSLO) 667 MG capsule Take 3 capsules (2,001 mg total) by mouth 3 (three) times daily with meals. 04/10/23  Yes Burnadette Pop, MD  ferrous sulfate 325 (65 FE) MG tablet Take 1 tablet (325 mg total) by mouth daily with breakfast. 04/11/23  Yes Burnadette Pop, MD  indomethacin (INDOCIN) 25 MG capsule Take 25 mg by mouth 2 (two) times daily as needed (for gout flare (with a meal)).   Yes [provider]  ipratropium-albuterol (DUONEB) 0.5-2.5 (3) MG/3ML  SOLN Take 3 mLs by nebulization every 6 (six) hours as needed (for shortness of breath and wheezing). 02/16/23  Yes [provider]  irbesartan (AVAPRO) 300 MG tablet Take 1 tablet (300 mg total) by mouth daily. 04/11/23  Yes Burnadette Pop, MD  magnesium oxide (MAG-OX) 400 MG tablet Take 1 tablet (400 mg total) by mouth daily. Patient taking differently: Take 400 mg by mouth daily. On Mon, Wed, and Fri 04/10/23  Yes Burnadette Pop, MD  metoprolol succinate (TOPROL-XL) 25 MG 24 hr tablet Take 3 tablets (75 mg total) by mouth daily. 04/11/23  Yes Burnadette Pop, MD  sildenafil (REVATIO) 20 MG tablet Take 20-60 mg by mouth daily as needed (erectile dysfunction). 06/03/20  Yes [provider]  sodium bicarbonate 650 MG tablet Take 1 tablet (650 mg total) by mouth 2 (two) times daily. 04/10/23  Yes Burnadette Pop, MD  tacrolimus (PROGRAF) 0.5 MG capsule Take 2 capsules (1 mg total) by mouth daily. 04/10/23  Yes Burnadette Pop, MD  aspirin EC 81 MG tablet Take 1  tablet (81 mg total) by mouth daily. Swallow whole. Patient not taking: Reported on 05/04/2023 04/11/23   Burnadette Pop, MD     Physical Exam: Vitals:   05/04/23 1613 05/04/23 1730 05/04/23 1830 05/04/23 2100  BP:  (!) 163/105 (!) 156/107 (!) 163/106  Pulse:  75 73 81  Resp:  (!) 26 (!) 30 (!) 31  Temp:      TempSrc:      SpO2: 100% 99% 97% 90%    Physical Exam   Labs on Admission: I have personally reviewed following labs and imaging studies  CBC: Recent Labs  Lab 05/04/23 1556  WBC 3.4*  HGB 10.1*  HCT 30.9*  MCV 89.8  PLT 136*   Basic Metabolic Panel: Recent Labs  Lab 05/04/23 1556  NA 138  K 4.1  CL 97*  CO2 23  GLUCOSE 92  BUN 52*  CREATININE 8.67*  CALCIUM 8.1*   GFR: CrCl cannot be calculated (Unknown ideal weight.). Liver Function Tests: Recent Labs  Lab 05/04/23 1556  AST 16  ALT 15  ALKPHOS 86  BILITOT 1.8*  PROT 7.7  ALBUMIN 2.5*   No results for input(s): "LIPASE",  "AMYLASE" in the last 168 hours. No results for input(s): "AMMONIA" in the last 168 hours. Coagulation Profile: No results for input(s): "INR", "PROTIME" in the last 168 hours. Cardiac Enzymes: No results for input(s): "CKTOTAL", "CKMB", "CKMBINDEX", "TROPONINI", "TROPONINIHS" in the last 168 hours. BNP (last 3 results) Recent Labs    06/29/22 0303 04/06/23 2113  BNP 2,310.7* >4,500.0*   HbA1C: No results for input(s): "HGBA1C" in the last 72 hours. CBG: No results for input(s): "GLUCAP" in the last 168 hours. Lipid Profile: No results for input(s): "CHOL", "HDL", "LDLCALC", "TRIG", "CHOLHDL", "LDLDIRECT" in the last 72 hours. Thyroid Function Tests: No results for input(s): "TSH", "T4TOTAL", "FREET4", "T3FREE", "THYROIDAB" in the last 72 hours. Anemia Panel: No results for input(s): "VITAMINB12", "FOLATE", "FERRITIN", "TIBC", "IRON", "RETICCTPCT" in the last 72 hours. Urine analysis: No results found for: "COLORURINE", "APPEARANCEUR", "LABSPEC", "PHURINE", "GLUCOSEU", "HGBUR", "BILIRUBINUR", "KETONESUR", "PROTEINUR", "UROBILINOGEN", "NITRITE", "LEUKOCYTESUR"  Radiological Exams on Admission: I have personally reviewed images CT ABDOMEN PELVIS WO CONTRAST Result Date: 05/04/2023 CLINICAL DATA:  Acute abdominal pain. EXAM: CT ABDOMEN AND PELVIS WITHOUT CONTRAST TECHNIQUE: Multidetector CT imaging of the abdomen and pelvis was performed following the standard protocol without IV contrast. RADIATION DOSE REDUCTION: This exam was performed according to the departmental dose-optimization program which includes automated exposure control, adjustment of the mA and/or kV according to patient size and/or use of iterative reconstruction technique. COMPARISON:  CT 02/10/2022, images available but no report FINDINGS: Lower chest: The heart is enlarged. There are small bilateral pleural effusions. Septal thickening and ground-glass opacity typical of pulmonary edema. Hepatobiliary: No evidence of  focal liver abnormality on this unenhanced exam. Potential but not definite capsular nodularity. Gallstones and probable sludge in the gallbladder. Ascites and motion artifact limit gallbladder assessment. The common bile duct is poorly defined. Pancreas: No ductal dilatation. Specific peripancreatic inflammation. Spleen: Normal in size without focal abnormality. Adrenals/Urinary Tract: No adrenal nodule. Polycystic appearance of the kidneys with replacement of normal parenchyma by innumerable cysts. No hydronephrosis. Renal vascular calcifications, there may be additional small nonobstructing renal calculi. Right lower quadrant renal transplant which appears atrophic. No transplant hydronephrosis. Decompressed urinary bladder. Stomach/Bowel: Mild gaseous gastric distension. No small bowel distension or evidence of obstruction. Colonic diverticulosis without evidence of diverticulitis. Detailed bowel assessment is limited due to motion and presence of ascites.  Normal appendix is visualized. Vascular/Lymphatic: Aortic and branch atherosclerosis. No aortic aneurysm. No bulky abdominopelvic adenopathy on this limited exam. Reproductive: Mildly prominent prostate. Other: Moderate to large volume abdominopelvic ascites. Generalized edema of the subcutaneous and intra-abdominal fat. No free air. Musculoskeletal: L4-L5 and L5-S1 degenerative disc disease. There are no acute or suspicious osseous abnormalities. IMPRESSION: 1. Moderate to large volume abdominopelvic ascites. 2. Questionable but not definite capsular nodularity of the liver, recommend correlation for cirrhosis risk factors. 3. Polycystic appearance of the kidneys. Right lower quadrant renal transplant which appears atrophic. 4. Small bilateral pleural effusions. Features of pulmonary edema in the lung bases. Electronically Signed   By: Narda Rutherford M.D.   On: 05/04/2023 21:25   DG Chest 1 View Result Date: 05/04/2023 CLINICAL DATA:  Shortness of  breath.  History of CHF. EXAM: CHEST  1 VIEW COMPARISON:  Chest radiograph dated 04/09/2023. FINDINGS: Stable cardiomediastinal silhouette. Aortic atherosclerosis. Bilateral perihilar interstitial prominence. No focal consolidation, sizeable pleural effusion, or pneumothorax. No acute osseous abnormality. IMPRESSION: Bilateral perihilar interstitial prominence suggestive of interstitial pulmonary edema. No focal consolidation. Electronically Signed   By: Hart Robinsons M.D.   On: 05/04/2023 17:57     EKG: My personal interpretation of EKG shows: EKG showing normal sinus rhythm with premature atrial complex,, premature ventricular complex with ST depression V4-V6 leads.     Assessment/Plan: Principal Problem:   Volume overload Active Problems:   History of CAD (coronary artery disease)   T wave inversion in EKG   Essential hypertension   OSA (obstructive sleep apnea)   Chronic systolic CHF (congestive heart failure) (HCC)   Chronic dyspnea dyspnea   Hyperlipidemia   Paroxysmal atrial fibrillation (HCC)   Chronic hypokalemia    Assessment and Plan: Volume overload-differential could be CHF exacerbation versus inadequate dialysis History of CHF with reduced EF less than 35% History of ESRD on HD MWF schedule -Patient presented emergency department complaining of acute on chronic dyspnea, worsening bilateral lower extremity swelling, abdominal swelling and stating that he has been compliant with dialysis - Physical exam showing bilateral lower extremity pitting edema. - Pending BNP and troponin level. -CMP showing evidence of chronic ESRD. - Chest x-ray showing pulmonary vascular congestion/pulmonary edema. - CT abdomen pelvis showing large volume abdominal pelvic ascites. -Concern for volume overload either CHF exacerbation versus inadequate fluid removal with dialysis session. - Given patient is anuric there is no benefit for IV diuresis. - Consulted nephrology Dr. Glenna Fellows plan for  dialysis in the morning. -Continue cardiac monitoring.  History of CAD EKG abnormality - EKG showing sinus rhythm with premature atrial complex history of abnormality in V4-V6 lateral leads. - Checking troponin and BNP. - History of CAD patient underwent heart cath in 04/08/2023 which showed 90% stenosis of the LAD, 30% stenosis of the proximal RCA. - Patient was recommended for medical management.  However patient is noncompliant with aspirin. -Consulting inpatient cardiology for recommendation.  Paroxysmal atrial fibrillation -Continue Eliquis and Toprol-XL.  ESRD on dialysis MWF schedule History pain and renal transplant on tacrolimus -Continue tacrolimus 1 mg daily - Consulted nephrology and nephrology plan for dialysis tomorrow. - Continue PhosLo and oral bicarb tablets.  Essential hypertension Chronic systolic heart failure reduced EF less than 35% -Patient complaining about abdominal swelling, bilateral lower extremity swelling and dyspnea on exertion. - Chest x-ray showing evidence of pulmonary vascular congestion.  CT abdomen pelvis showing abdominal pelvic ascites. -Continue Avapro 300 mg daily and Toprol-XL. -Checking BMP and troponin level  History of  pulmonary hypertension -Heart cath in January showed patient has significant pulmonary hypertension.  On discharge patient need to refer to pulmonology for evaluation.  Chronic dyspnea Concern for underlying OSA -Concern for underlying obstructive sleep apnea.  Patient need outpatient sleep study.   DVT prophylaxis:  Eliquis Code Status:  Full Code Diet:  Family Communication:  *** Family was present at bedside, at the time of interview.  Opportunity was given to ask question and all questions were answered satisfactorily.  Disposition Plan:  ***  Consults:  ***  Admission status:   Inpatient, Telemetry bed  Severity of Illness: The appropriate patient status for this patient is INPATIENT. Inpatient status is  judged to be reasonable and necessary in order to provide the required intensity of service to ensure the patient's safety. The patient's presenting symptoms, physical exam findings, and initial radiographic and laboratory data in the context of their chronic comorbidities is felt to place them at high risk for further clinical deterioration. Furthermore, it is not anticipated that the patient will be medically stable for discharge from the hospital within 2 midnights of admission.   * I certify that at the point of admission it is my clinical judgment that the patient will require inpatient hospital care spanning beyond 2 midnights from the point of admission due to high intensity of service, high risk for further deterioration and high frequency of surveillance required.Marland Kitchen    Tereasa Coop, MD [facility]  How to contact the [facility] Attending or Consulting provider 7A - 7P or covering provider during after hours 7P -7A, for this patient.  Check the care team in [facility] and look for a) attending/consulting [facility] provider listed and b) the [facility] team listed Log into [email or web address] and use [place]'s universal password to access. If you do not have the password, please contact the hospital operator. Locate the [facility] provider you are looking for under [facility] and page to a number that you can be directly reached. If you still have difficulty reaching the provider, please page the [facility] (Director on Call) for the Hospitalists listed on amion for assistance.  05/04/2023, 10:04 PM

## 2023-05-04 NOTE — ED Notes (Signed)
 Doctor notified of pt increasing pain in abdomen.

## 2023-05-04 NOTE — ED Triage Notes (Signed)
 Pt BIB EMS for ABD distention and SOB. ON Dialysis M/W/F. Completed last treatment with no issues. On 2 L via [place], not on baseline oxygen. EMS denies hypoxia en route. Denies N/V/D. Pt denies CP.

## 2023-05-04 NOTE — ED Notes (Signed)
 Patient transported to CT

## 2023-05-04 NOTE — ED Provider Notes (Signed)
 [place] EMERGENCY DEPARTMENT AT [facility] Provider Note  CSN: [id number] Arrival date & time: 05/04/23 1538  Chief Complaint(s) Abdominal Pain and Shortness of Breath  HPI Jacob Daniel is a 68 y.o. male history of CHF, diabetes, end-stage renal disease on dialysis, presenting to the emergency department with abdominal distention.  Patient reports abdominal distention beginning this morning.  He also reports abdominal pain.  No nausea, vomiting, no diarrhea.  No fevers or chills.  He reports that he is also had some leg swelling, mild shortness of breath worse than his baseline shortness of breath.  No chest pain.  He reports that he has had abdominal distention before, usually comes to the hospital, has treatment and it gets better.  He does not know what kind of treatment he gets and he is not sure what has been the cause.   Past Medical History Past Medical History:  Diagnosis Date  . Arthritis   . CHF (congestive heart failure) (HCC)   . Diabetes mellitus without complication (HCC)   . ESRD on hemodialysis (HCC)   . Hypertension    Patient Active Problem List   Diagnosis Date Noted  . Volume overload 05/04/2023  . History of CAD (coronary artery disease) 05/04/2023  . Chronic dyspnea dyspnea 05/04/2023  . Hyperlipidemia 05/04/2023  . Paroxysmal atrial fibrillation (HCC) 05/04/2023  . Chronic hypokalemia 05/04/2023  . Atrial flutter with rapid ventricular response (HCC) 04/07/2023  . RSV (respiratory syncytial virus pneumonia) 04/07/2023  . Acute pulmonary edema (HCC) 04/07/2023  . Atrial fibrillation with RVR (HCC) 04/07/2023  . Acute on chronic systolic CHF (congestive heart failure) (HCC) 04/07/2023  . Coronary artery calcification seen on CAT scan 04/07/2023  . Pure hypercholesterolemia 04/07/2023  . Pulmonary hypertension, unspecified (HCC) 04/07/2023  . Pulmonary edema 01/17/2020  . Erectile dysfunction 11/23/2019  . Alcohol abuse 10/04/2019  .  Cannabis abuse 10/04/2019  . Cardiomyopathy in diseases classified elsewhere (HCC) 10/04/2019  . Chronic kidney disease, stage 3 unspecified (HCC) 10/04/2019  . Dependence on renal dialysis (HCC) 10/04/2019  . Hypomagnesemia 10/04/2019  . Metabolic acidosis 10/04/2019  . Obesity 10/04/2019  . OSA (obstructive sleep apnea) 10/04/2019  . 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 Chronic systolic CHF (congestive heart failure) (HCC) 10/04/2019  . End stage renal disease (HCC) 10/04/2019  . Balance problem 05/26/2018  . Chronic pain of right knee 05/26/2018  . Right lumbar radiculitis 04/28/2018  . Anemia 10/20/2017  . History of colon polyps 10/20/2017  . NSAID long-term use 10/20/2017  . Positive fecal occult blood test 10/20/2017  . Acute gout 09/08/2017  . Chronic bilateral low back pain with right-sided sciatica 09/08/2017  . 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 Essential hypertension 09/08/2013  . Sciatica 09/08/2013  . History of renal transplant 08/10/2013  . 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 Authorizing Provider  acetaminophen-codeine (TYLENOL #2) 300-15 MG tablet Take 1 tablet by mouth every 8 (eight) hours as needed for moderate pain (pain score 4-6). 02/25/23  Yes [provider]  albuterol (ACCUNEB) 0.63 MG/3ML nebulizer solution Take 1 ampule by nebulization every 6 (six) hours as needed for wheezing.   Yes [provider]  albuterol (VENTOLIN HFA) 108 (90 Base) MCG/ACT inhaler Inhale 2 puffs into the lungs every 6 (six) hours as needed for wheezing.   Yes [provider]  apixaban (ELIQUIS) 5 MG TABS tablet Take 1 tablet (5 mg total) by mouth 2 (two) times daily. 04/10/23  Yes Burnadette Pop, MD  atorvastatin (LIPITOR) 10 MG tablet Take 1 tablet  (10 mg total) by mouth daily. 04/10/23 01/13/27 Yes Burnadette Pop, MD  calcium acetate (PHOSLO) 667 MG capsule Take 3 capsules (2,001 mg total) by mouth 3 (three) times daily with meals. 04/10/23  Yes Burnadette Pop, MD  ferrous sulfate 325 (65 FE) MG tablet Take 1 tablet (325 mg total) by mouth daily with breakfast. 04/11/23  Yes Burnadette Pop, MD  indomethacin (INDOCIN) 25 MG capsule Take 25 mg by mouth 2 (two) times daily as needed (for gout flare (with a meal)).   Yes [provider]  ipratropium-albuterol (DUONEB) 0.5-2.5 (3) MG/3ML SOLN Take 3 mLs by nebulization every 6 (six) hours as needed (for shortness of breath and wheezing). 02/16/23  Yes [provider]  irbesartan (AVAPRO) 300 MG tablet Take 1 tablet (300 mg total) by mouth daily. 04/11/23  Yes Burnadette Pop, MD  magnesium oxide (MAG-OX) 400 MG tablet Take 1 tablet (400 mg total) by mouth daily. Patient taking differently: Take 400 mg by mouth daily. On Mon, Wed, and Fri 04/10/23  Yes Burnadette Pop, MD  metoprolol succinate (TOPROL-XL) 25 MG 24 hr tablet Take 3 tablets (75 mg total) by mouth daily. 04/11/23  Yes Burnadette Pop, MD  sildenafil (REVATIO) 20 MG tablet Take 20-60 mg by mouth daily as needed (erectile dysfunction). 06/03/20  Yes [provider]  sodium bicarbonate 650 MG tablet Take 1 tablet (650 mg total) by mouth 2 (two) times daily. 04/10/23  Yes Burnadette Pop, MD  tacrolimus (PROGRAF) 0.5 MG capsule Take 2 capsules (1 mg total) by mouth daily. 04/10/23  Yes Burnadette Pop, MD  aspirin EC 81 MG tablet Take 1 tablet (81 mg total) by mouth daily. Swallow whole. Patient not taking: Reported on 05/04/2023 04/11/23   Burnadette Pop, MD                                                                                                                                    Past Surgical History Past Surgical History:  Procedure Laterality Date  . KIDNEY TRANSPLANT  2010  . RIGHT/LEFT HEART CATH AND  CORONARY ANGIOGRAPHY N/A 04/08/2023   Procedure: RIGHT/LEFT HEART CATH AND CORONARY ANGIOGRAPHY;  Surgeon: Yates Decamp, MD;  Location: [facility] INVASIVE CV LAB;  Service: Cardiovascular;  Laterality: N/A;  . SPINE SURGERY     Family History History reviewed. No pertinent family history.  Social History Social History   Tobacco Use  . Smoking status: Never  . Smokeless tobacco: Never  Substance Use Topics  . Alcohol use: Not Currently    Alcohol/week: 1.0 standard drink of alcohol    Types: 1 Cans of beer per week  . Drug use: Yes    Types: Marijuana    Comment: "as many times as  i can get it "   Allergies Patient has no known allergies.  Review of Systems Review of Systems  All other systems reviewed and are negative.   Physical Exam Vital Signs  I have reviewed the triage vital signs BP (!) 163/106   Pulse 81   Temp 98.2 F (36.8 C) (Oral)   Resp (!) 31   SpO2 90%  Physical Exam Vitals and nursing note reviewed.  Constitutional:      General: He is not in acute distress.    Appearance: Normal appearance.  HENT:     Mouth/Throat:     Mouth: Mucous membranes are moist.  Eyes:     Conjunctiva/sclera: Conjunctivae normal.  Cardiovascular:     Rate and Rhythm: Normal rate and regular rhythm.  Pulmonary:     Effort: Pulmonary effort is normal. No respiratory distress.     Breath sounds: Normal breath sounds.  Abdominal:     General: Abdomen is flat. There is distension.     Palpations: Abdomen is soft. There is fluid wave.     Tenderness: There is abdominal tenderness (mild) in the epigastric area. There is no guarding or rebound.  Musculoskeletal:     Right lower leg: Edema present.     Left lower leg: Edema present.  Skin:    General: Skin is warm and dry.     Capillary Refill: Capillary refill takes less than 2 seconds.  Neurological:     Mental Status: He is alert and oriented to person, place, and time. Mental status is at baseline.  Psychiatric:        Mood  and Affect: Mood normal.        Behavior: Behavior normal.    ED Results and Treatments Labs (all labs ordered are listed, but only abnormal results are displayed) Labs Reviewed  COMPREHENSIVE METABOLIC PANEL - Abnormal; Notable for the following components:      Result Value   Chloride 97 (*)    BUN 52 (*)    Creatinine, Ser 8.67 (*)    Calcium 8.1 (*)    Albumin 2.5 (*)    Total Bilirubin 1.8 (*)    GFR, Estimated 6 (*)    Anion gap 18 (*)    All other components within normal limits  CBC - Abnormal; Notable for the following components:   WBC 3.4 (*)    RBC 3.44 (*)    Hemoglobin 10.1 (*)    HCT 30.9 (*)    RDW 17.2 (*)    Platelets 136 (*)    All other components within normal limits  RESP PANEL BY RT-PCR (RSV, FLU A&B, COVID)  RVPGX2  HEPATITIS B SURFACE ANTIGEN  HEPATITIS B SURFACE ANTIBODY, QUANTITATIVE  BRAIN NATRIURETIC PEPTIDE  CBC  COMPREHENSIVE METABOLIC PANEL  PROTIME-INR  APTT  Radiology CT ABDOMEN PELVIS WO CONTRAST Result Date: 05/04/2023 CLINICAL DATA:  Acute abdominal pain. EXAM: CT ABDOMEN AND PELVIS WITHOUT CONTRAST TECHNIQUE: Multidetector CT imaging of the abdomen and pelvis was performed following the standard protocol without IV contrast. RADIATION DOSE REDUCTION: This exam was performed according to the departmental dose-optimization program which includes automated exposure control, adjustment of the mA and/or kV according to patient size and/or use of iterative reconstruction technique. COMPARISON:  CT 02/10/2022, images available but no report FINDINGS: Lower chest: The heart is enlarged. There are small bilateral pleural effusions. Septal thickening and ground-glass opacity typical of pulmonary edema. Hepatobiliary: No evidence of focal liver abnormality on this unenhanced exam. Potential but not definite capsular nodularity.  Gallstones and probable sludge in the gallbladder. Ascites and motion artifact limit gallbladder assessment. The common bile duct is poorly defined. Pancreas: No ductal dilatation. Specific peripancreatic inflammation. Spleen: Normal in size without focal abnormality. Adrenals/Urinary Tract: No adrenal nodule. Polycystic appearance of the kidneys with replacement of normal parenchyma by innumerable cysts. No hydronephrosis. Renal vascular calcifications, there may be additional small nonobstructing renal calculi. Right lower quadrant renal transplant which appears atrophic. No transplant hydronephrosis. Decompressed urinary bladder. Stomach/Bowel: Mild gaseous gastric distension. No small bowel distension or evidence of obstruction. Colonic diverticulosis without evidence of diverticulitis. Detailed bowel assessment is limited due to motion and presence of ascites. Normal appendix is visualized. Vascular/Lymphatic: Aortic and branch atherosclerosis. No aortic aneurysm. No bulky abdominopelvic adenopathy on this limited exam. Reproductive: Mildly prominent prostate. Other: Moderate to large volume abdominopelvic ascites. Generalized edema of the subcutaneous and intra-abdominal fat. No free air. Musculoskeletal: L4-L5 and L5-S1 degenerative disc disease. There are no acute or suspicious osseous abnormalities. IMPRESSION: 1. Moderate to large volume abdominopelvic ascites. 2. Questionable but not definite capsular nodularity of the liver, recommend correlation for cirrhosis risk factors. 3. Polycystic appearance of the kidneys. Right lower quadrant renal transplant which appears atrophic. 4. Small bilateral pleural effusions. Features of pulmonary edema in the lung bases. Electronically Signed   By: Narda Rutherford M.D.   On: 05/04/2023 21:25   DG Chest 1 View Result Date: 05/04/2023 CLINICAL DATA:  Shortness of breath.  History of CHF. EXAM: CHEST  1 VIEW COMPARISON:  Chest radiograph dated 04/09/2023. FINDINGS:  Stable cardiomediastinal silhouette. Aortic atherosclerosis. Bilateral perihilar interstitial prominence. No focal consolidation, sizeable pleural effusion, or pneumothorax. No acute osseous abnormality. IMPRESSION: Bilateral perihilar interstitial prominence suggestive of interstitial pulmonary edema. No focal consolidation. Electronically Signed   By: Hart Robinsons M.D.   On: 05/04/2023 17:57    Pertinent labs & imaging results that were available during my care of the patient were reviewed by me and considered in my medical decision making (see MDM for details).  Medications Ordered in ED Medications  Chlorhexidine Gluconate Cloth 2 % PADS 6 each (has no administration in time range)  aspirin EC tablet 81 mg (has no administration in time range)  atorvastatin (LIPITOR) tablet 10 mg (has no administration in time range)  irbesartan (AVAPRO) tablet 300 mg (has no administration in time range)  metoprolol succinate (TOPROL-XL) 24 hr tablet 75 mg (has no administration in time range)  calcium acetate (PHOSLO) capsule 2,001 mg (has no administration in time range)  sodium bicarbonate tablet 650 mg (has no administration in time range)  apixaban (ELIQUIS) tablet 5 mg (has no administration in time range)  ferrous sulfate tablet 325 mg (has no administration in time range)  tacrolimus (PROGRAF) capsule 1 mg (has no administration in time  range)  ipratropium-albuterol (DUONEB) 0.5-2.5 (3) MG/3ML nebulizer solution 3 mL (has no administration in time range)  sodium chloride flush (NS) 0.9 % injection 3 mL (has no administration in time range)  sodium chloride flush (NS) 0.9 % injection 3 mL (has no administration in time range)  sodium chloride flush (NS) 0.9 % injection 3 mL (has no administration in time range)  0.9 %  sodium chloride infusion (has no administration in time range)  acetaminophen (TYLENOL) tablet 650 mg (has no administration in time range)    Or  acetaminophen (TYLENOL)  suppository 650 mg (has no administration in time range)  docusate sodium (COLACE) capsule 100 mg (has no administration in time range)  ondansetron (ZOFRAN) tablet 4 mg (has no administration in time range)    Or  ondansetron (ZOFRAN) injection 4 mg (has no administration in time range)  HYDROmorphone (DILAUDID) injection 0.5 mg (0.5 mg Intravenous Given 05/04/23 1826)                                                                                                                                     Procedures .Critical Care  Performed by: [name], MD Authorized by: [name], MD   Critical care provider statement:    Critical care time (minutes):  30   Critical care was necessary to treat or prevent imminent or life-threatening deterioration of the following conditions:  Respiratory failure   Critical care was time spent personally by me on the following activities:  Development of treatment plan with patient or surrogate, discussions with consultants, evaluation of patient's response to treatment, examination of patient, ordering and review of laboratory studies, ordering and review of radiographic studies, ordering and performing treatments and interventions, pulse oximetry, re-evaluation of patient's condition and review of old charts   Care discussed with: admitting provider     (including critical care time)  Medical Decision Making / ED Course   MDM:  68 year old presenting to the emergency department shortness of breath.  Patient overall well-appearing, physical examination with mild abdominal distention, no focal tenderness or peritoneal signs.  He also has some lower extremity edema.  Differential includes CHF, cirrhosis, obstruction, perforation, volvulus, constipation, other intra-abdominal process.  Will check CT abdomen given mild tenderness.  Low concern for SBP.  No fever or leukocytosis, no peritoneal signs.  He does have some lower extremity as  well.  He reports that he has had similar problems previously and it gets better due to some type of hospital treatment.  Last hospitalization was for CHF so could be related to this.  He is placed on oxygen by paramedics, this was discontinued and he is not hypoxic on room air.  He does report that he had dialysis yesterday.  He does not make any urine.  Clinical Course as of 05/04/23 2215  Tue May 04, 2023  2213 CT scan showed his ascites likely due to  volume overloaded status.  Discussed with Dr. Glenna Fellows, agrees with admission, they will perform dialysis tomorrow morning.  Discussed with Dr. Jyl Heinz who will admit the patient. [WS]    Clinical Course User Index [WS] [name], MD     Additional history obtained: -Additional history obtained from ems -External records from outside source obtained and reviewed including: Chart review including previous notes, labs, imaging, consultation notes including prior notes    Lab Tests: -I ordered, reviewed, and interpreted labs.   The pertinent results include:   Labs Reviewed  COMPREHENSIVE METABOLIC PANEL - Abnormal; Notable for the following components:      Result Value   Chloride 97 (*)    BUN 52 (*)    Creatinine, Ser 8.67 (*)    Calcium 8.1 (*)    Albumin 2.5 (*)    Total Bilirubin 1.8 (*)    GFR, Estimated 6 (*)    Anion gap 18 (*)    All other components within normal limits  CBC - Abnormal; Notable for the following components:   WBC 3.4 (*)    RBC 3.44 (*)    Hemoglobin 10.1 (*)    HCT 30.9 (*)    RDW 17.2 (*)    Platelets 136 (*)    All other components within normal limits  RESP PANEL BY RT-PCR (RSV, FLU A&B, COVID)  RVPGX2  HEPATITIS B SURFACE ANTIGEN  HEPATITIS B SURFACE ANTIBODY, QUANTITATIVE  BRAIN NATRIURETIC PEPTIDE  CBC  COMPREHENSIVE METABOLIC PANEL  PROTIME-INR  APTT    Notable for mild anemia , signs of CKD  EKG   EKG Interpretation Date/Time:  Tuesday May 04 2023 15:49:46  EST Ventricular Rate:  73 PR Interval:  192 QRS Duration:  98 QT Interval:  464 QTC Calculation: 511 R Axis:   72  Text Interpretation: Sinus rhythm with Premature atrial complexes with Abberant conduction Nonspecific ST abnormality Prolonged QT Abnormal ECG When compared with ECG of 09-Apr-2023 05:23, PREVIOUS ECG IS PRESENT Confirmed by Alvino Blood ([id number]) on 05/04/2023 8:51:57 PM         Imaging Studies ordered: I ordered imaging studies including CT abdomen, CXR  On my interpretation imaging demonstrates ascites , pulm edema  I independently visualized and interpreted imaging. I agree with the radiologist interpretation   Medicines ordered and prescription drug management: Meds ordered this encounter  Medications  . HYDROmorphone (DILAUDID) injection 0.5 mg  . Chlorhexidine Gluconate Cloth 2 % PADS 6 each  . aspirin EC tablet 81 mg    Swallow whole.    Marland Kitchen atorvastatin (LIPITOR) tablet 10 mg  . irbesartan (AVAPRO) tablet 300 mg  . metoprolol succinate (TOPROL-XL) 24 hr tablet 75 mg  . calcium acetate (PHOSLO) capsule 2,001 mg  . sodium bicarbonate tablet 650 mg  . apixaban (ELIQUIS) tablet 5 mg  . ferrous sulfate tablet 325 mg  . tacrolimus (PROGRAF) capsule 1 mg  . ipratropium-albuterol (DUONEB) 0.5-2.5 (3) MG/3ML nebulizer solution 3 mL  . sodium chloride flush (NS) 0.9 % injection 3 mL  . sodium chloride flush (NS) 0.9 % injection 3 mL  . sodium chloride flush (NS) 0.9 % injection 3 mL  . 0.9 %  sodium chloride infusion  . OR Linked Order Group   . acetaminophen (TYLENOL) tablet 650 mg   . acetaminophen (TYLENOL) suppository 650 mg  . docusate sodium (COLACE) capsule 100 mg  . OR Linked Order Group   . ondansetron (ZOFRAN) tablet 4 mg   . ondansetron (ZOFRAN) injection 4  mg    -I have reviewed the patients home medicines and have made adjustments as needed   Consultations Obtained: I requested consultation with the nephrologist,  and discussed lab and  imaging findings as well as pertinent plan - they recommend: dialysis tomorrow    Cardiac Monitoring: The patient was maintained on a cardiac monitor.  I personally viewed and interpreted the cardiac monitored which showed an underlying rhythm of: NSR   Reevaluation: After the interventions noted above, I reevaluated the patient and found that their symptoms have improved  Co morbidities that complicate the patient evaluation . Past Medical History:  Diagnosis Date  . Arthritis   . CHF (congestive heart failure) (HCC)   . Diabetes mellitus without complication (HCC)   . ESRD on hemodialysis (HCC)   . Hypertension       Dispostion: Disposition decision including need for hospitalization was considered, and patient admitted to the hospital.    Final Clinical Impression(s) / ED Diagnoses Final diagnoses:  Acute hypoxic respiratory failure (HCC)  Hypervolemia, unspecified hypervolemia type     This chart was dictated using voice recognition software.  Despite best efforts to proofread,  errors can occur which can change the documentation meaning.    [name], MD 05/04/23 2215

## 2023-05-05 ENCOUNTER — Inpatient Hospital Stay (HOSPITAL_COMMUNITY): Payer: 59

## 2023-05-05 ENCOUNTER — Encounter (HOSPITAL_COMMUNITY): Payer: Self-pay | Admitting: Internal Medicine

## 2023-05-05 DIAGNOSIS — R9431 Abnormal electrocardiogram [ECG] [EKG]: Secondary | ICD-10-CM | POA: Diagnosis not present

## 2023-05-05 DIAGNOSIS — E785 Hyperlipidemia, unspecified: Secondary | ICD-10-CM

## 2023-05-05 DIAGNOSIS — N186 End stage renal disease: Secondary | ICD-10-CM | POA: Diagnosis not present

## 2023-05-05 DIAGNOSIS — Z8679 Personal history of other diseases of the circulatory system: Secondary | ICD-10-CM | POA: Diagnosis not present

## 2023-05-05 DIAGNOSIS — Z992 Dependence on renal dialysis: Secondary | ICD-10-CM

## 2023-05-05 DIAGNOSIS — I5022 Chronic systolic (congestive) heart failure: Secondary | ICD-10-CM | POA: Diagnosis not present

## 2023-05-05 LAB — COMPREHENSIVE METABOLIC PANEL
ALT: 12 U/L (ref 0–44)
AST: 12 U/L — ABNORMAL LOW (ref 15–41)
Albumin: 2.2 g/dL — ABNORMAL LOW (ref 3.5–5.0)
Alkaline Phosphatase: 71 U/L (ref 38–126)
Anion gap: 14 (ref 5–15)
BUN: 53 mg/dL — ABNORMAL HIGH (ref 8–23)
CO2: 23 mmol/L (ref 22–32)
Calcium: 7.6 mg/dL — ABNORMAL LOW (ref 8.9–10.3)
Chloride: 99 mmol/L (ref 98–111)
Creatinine, Ser: 8.88 mg/dL — ABNORMAL HIGH (ref 0.61–1.24)
GFR, Estimated: 6 mL/min — ABNORMAL LOW (ref >60)
Glucose, Bld: 92 mg/dL (ref 70–99)
Potassium: 4.1 mmol/L (ref 3.5–5.1)
Sodium: 136 mmol/L (ref 135–145)
Total Bilirubin: 1.7 mg/dL — ABNORMAL HIGH (ref 0.0–1.2)
Total Protein: 7.1 g/dL (ref 6.5–8.1)

## 2023-05-05 LAB — APTT: aPTT: 34 s (ref 24–36)

## 2023-05-05 LAB — PROTIME-INR
INR: 1.4 — ABNORMAL HIGH (ref 0.8–1.2)
Prothrombin Time: 17.3 s — ABNORMAL HIGH (ref 11.4–15.2)

## 2023-05-05 LAB — BRAIN NATRIURETIC PEPTIDE: B Natriuretic Peptide: >4500 pg/mL — ABNORMAL HIGH (ref 0.0–100.0)

## 2023-05-05 LAB — CBC
HCT: 25.8 % — ABNORMAL LOW (ref 39.0–52.0)
Hemoglobin: 8.5 g/dL — ABNORMAL LOW (ref 13.0–17.0)
MCH: 29.2 pg (ref 26.0–34.0)
MCHC: 32.9 g/dL (ref 30.0–36.0)
MCV: 88.7 fL (ref 80.0–100.0)
Platelets: 137 10*3/uL — ABNORMAL LOW (ref 150–400)
RBC: 2.91 MIL/uL — ABNORMAL LOW (ref 4.22–5.81)
RDW: 17.2 % — ABNORMAL HIGH (ref 11.5–15.5)
WBC: 3.2 10*3/uL — ABNORMAL LOW (ref 4.0–10.5)
nRBC: 0 % (ref 0.0–0.2)

## 2023-05-05 LAB — HEPATITIS B SURFACE ANTIGEN: Hepatitis B Surface Ag: NONREACTIVE

## 2023-05-05 MED ORDER — ALTEPLASE 2 MG IJ SOLR: 2.0000 mg | Freq: Once | INTRAMUSCULAR | Status: DC | PRN

## 2023-05-05 MED ORDER — LIDOCAINE HCL (PF) 1 % IJ SOLN: 5.0000 mL | INTRAMUSCULAR | Status: DC | PRN

## 2023-05-05 MED ORDER — PENTAFLUOROPROP-TETRAFLUOROETH EX AERO: 1.0000 | INHALATION_SPRAY | CUTANEOUS | Status: DC | PRN

## 2023-05-05 MED ORDER — ANTICOAGULANT SODIUM CITRATE 4% (200MG/5ML) IV SOLN: 5.0000 mL | Status: DC | PRN

## 2023-05-05 MED ORDER — LIDOCAINE-PRILOCAINE 2.5-2.5 % EX CREA: 1.0000 | TOPICAL_CREAM | CUTANEOUS | Status: DC | PRN

## 2023-05-05 MED ORDER — HEPARIN SODIUM (PORCINE) 1000 UNIT/ML DIALYSIS: 1000.0000 [IU] | INTRAMUSCULAR | Status: DC | PRN

## 2023-05-05 NOTE — Progress Notes (Signed)
 Heart Failure Navigator Progress Note  Assessed for Heart & Vascular TOC clinic readiness.  Patient does not meet criteria due to ESRD on hemodialysis.   Navigator will sign off at this time.   Rhae Hammock, BSN, Scientist, clinical (histocompatibility and immunogenetics) Only

## 2023-05-05 NOTE — Procedures (Signed)
 I was present at the procedure, reviewed the HD regimen and made appropriate changes.   Vinson Moselle MD  [facility] 05/05/2023, 2:10 PM

## 2023-05-05 NOTE — Consult Note (Addendum)
 Renal Service Consult Note [facility]  Jacob Daniel 05/05/2023 Jacob Krabbe, MD Requesting Physician: Dr. Ella Daniel  Reason for Consult: ESRD pt w/ abdominal distension and SOB HPI: The patient is a 68 y.o. year-old w/ PMH as below who presented to ED c/o abd distension and SOB. On HD MWF, completed HD Monday w/o issues. Not on home O2. No n/v/d/ cp. He did miss one HD session last week. In ED pt endorsed bilat LE swelling and DOE worse than baseline. Hx LHC in Jan 2025 w/ 90% apical LAD, prox 30% RCA stenosis and severe pHTN w/ severe RV dysfunction.  In ED BP 160/105, Hb 10, wbc 3K and HR/ RR stable. Creat 8.6. CXR showed IS edema. CT abd showed lod-large vol ascites. Also nodularity of the liver capsule suggesting cirrhosis. Pt was admitted. We are asked to see for dialysis.   Pt seen in room. Pt lives w/ his mother. Takes public transportation to dialysis. Main issues as above are leg swelling and DOE.     PMH CHF DM2 ESRD on HD HTN H/o failed renal transplant    ROS - denies CP, no joint pain, no HA, no blurry vision, no rash, no diarrhea, no nausea/ vomiting, no dysuria, no difficulty voiding   Past Medical History  Past Medical History:  Diagnosis Date   Arthritis    CHF (congestive heart failure) (HCC)    Diabetes mellitus without complication (HCC)    ESRD on hemodialysis (HCC)    Hypertension    Past Surgical History  Past Surgical History:  Procedure Laterality Date   KIDNEY TRANSPLANT  2010   RIGHT/LEFT HEART CATH AND CORONARY ANGIOGRAPHY N/A 04/08/2023   Procedure: RIGHT/LEFT HEART CATH AND CORONARY ANGIOGRAPHY;  Surgeon: Yates Decamp, MD;  Location: [facility];  Service: Cardiovascular;  Laterality: N/A;   SPINE SURGERY     Family History History reviewed. No pertinent family history. Social History  reports that he has never smoked. He has never used smokeless tobacco. He reports that he does not currently use alcohol after a past  usage of about 1.0 standard drink of alcohol per week. He reports current drug use. Drug: Marijuana. Allergies No Known Allergies Home medications Prior to Admission medications   Medication Sig Start Date End Date Taking? Authorizing Provider  acetaminophen-codeine (TYLENOL #2) 300-15 MG tablet Take 1 tablet by mouth every 8 (eight) hours as needed for moderate pain (pain score 4-6). 02/25/23  Yes [provider]  albuterol (ACCUNEB) 0.63 MG/3ML nebulizer solution Take 1 ampule by nebulization every 6 (six) hours as needed for wheezing.   Yes [provider]  albuterol (VENTOLIN HFA) 108 (90 Base) MCG/ACT inhaler Inhale 2 puffs into the lungs every 6 (six) hours as needed for wheezing.   Yes [provider]  apixaban (ELIQUIS) 5 MG TABS tablet Take 1 tablet (5 mg total) by mouth 2 (two) times daily. 04/10/23  Yes Burnadette Pop, MD  atorvastatin (LIPITOR) 10 MG tablet Take 1 tablet (10 mg total) by mouth daily. 04/10/23 01/13/27 Yes Burnadette Pop, MD  calcium acetate (PHOSLO) 667 MG capsule Take 3 capsules (2,001 mg total) by mouth 3 (three) times daily with meals. 04/10/23  Yes Burnadette Pop, MD  ferrous sulfate 325 (65 FE) MG tablet Take 1 tablet (325 mg total) by mouth daily with breakfast. 04/11/23  Yes Burnadette Pop, MD  indomethacin (INDOCIN) 25 MG capsule Take 25 mg by mouth 2 (two) times daily as needed (for gout flare (  with a meal)).   Yes [provider]  ipratropium-albuterol (DUONEB) 0.5-2.5 (3) MG/3ML SOLN Take 3 mLs by nebulization every 6 (six) hours as needed (for shortness of breath and wheezing). 02/16/23  Yes [provider]  irbesartan (AVAPRO) 300 MG tablet Take 1 tablet (300 mg total) by mouth daily. 04/11/23  Yes Burnadette Pop, MD  magnesium oxide (MAG-OX) 400 MG tablet Take 1 tablet (400 mg total) by mouth daily. Patient taking differently: Take 400 mg by mouth daily. On Mon, Wed, and Fri 04/10/23  Yes Burnadette Pop, MD   metoprolol succinate (TOPROL-XL) 25 MG 24 hr tablet Take 3 tablets (75 mg total) by mouth daily. 04/11/23  Yes Burnadette Pop, MD  sildenafil (REVATIO) 20 MG tablet Take 20-60 mg by mouth daily as needed (erectile dysfunction). 06/03/20  Yes [provider]  sodium bicarbonate 650 MG tablet Take 1 tablet (650 mg total) by mouth 2 (two) times daily. 04/10/23  Yes Burnadette Pop, MD  tacrolimus (PROGRAF) 0.5 MG capsule Take 2 capsules (1 mg total) by mouth daily. 04/10/23  Yes Burnadette Pop, MD  aspirin EC 81 MG tablet Take 1 tablet (81 mg total) by mouth daily. Swallow whole. Patient not taking: Reported on 05/04/2023 04/11/23   Burnadette Pop, MD     Vitals:   05/04/23 2330 05/05/23 0001 05/05/23 0300 05/05/23 0801  BP: (!) 160/101 (!) 145/109 131/87   Pulse: 73 79  73  Resp: (!) 32 18 (!) 23   Temp:  (!) 97.5 F (36.4 C)  97.6 F (36.4 C)  TempSrc:  Oral  Oral  SpO2: 90%   100%  Weight:  88.4 kg    Height:  6' (1.829 m)     Exam Gen alert, no distress No rash, cyanosis or gangrene Sclera anicteric, throat clear  No jvd or bruits Chest clear bilat to bases, no rales/ wheezing RRR no MRG Abd soft ntnd no mass or ascites +bs GU nl male MS no joint effusions or deformity Ext 1-2 + pitting bilat PT edema, no other edema Neuro is alert, Ox 3 , nf    LUA AVF +bruit       Renal-related home meds: - phoslo 3 ac tid - irbesartan 300 every day - metoprolol xl 75 every day - sod bicarb 650 - tacrolimus 1mg  daily - others: revatio, duoneb, indocin, lipitor, eliquis, albuterol     OP HD: [facility] MWF  Jan 2025 -->   90.5kg  LUA AVF    BP 150 / 101,  HR 70-80, RR 18-32  Assessment/ Plan: Volume overload - w/ bilat LE edema and early IS edema on CXR. Plan HD today.  HFrEF - last EF 35%  ESRD - on HD MWF. Plan is for HD today, max UF as tolerated.  HTN - BP"s normal to a bit high. Keep up w home meds.  Volume - get wt's when he can stand. +edema in the  legs. HD as above.  Anemia of esrd - Hb 10, no esa needs. Get records.  Secondary hyperparathyroidism - CCa in range. Add on phos.  PAF Hx of failed renal tranplant - remains on low dose 1 mg prograf per day.  Chronic hypoxic resp faliure - uses 1-2 L O2 [place] at bases      Vinson Moselle  MD [facility] 05/05/2023, 8:18 AM  Recent Labs  Lab 05/04/23 1556 05/05/23 0120  HGB 10.1* 8.5*  ALBUMIN 2.5* 2.2*  CALCIUM 8.1* 7.6*  CREATININE 8.67* 8.88*  K 4.1 4.1   Inpatient medications:  apixaban  5 mg Oral BID   aspirin EC  81 mg Oral Daily   atorvastatin  10 mg Oral Daily   calcium acetate  2,001 mg Oral TID WC   docusate sodium  100 mg Oral BID   ferrous sulfate  325 mg Oral Q breakfast   irbesartan  300 mg Oral Daily   metoprolol succinate  75 mg Oral Daily   sodium bicarbonate  650 mg Oral BID   sodium chloride flush  3 mL Intravenous Q12H   sodium chloride flush  3 mL Intravenous Q12H   tacrolimus  1 mg Oral Daily    sodium chloride     anticoagulant sodium citrate     sodium chloride, acetaminophen **OR** acetaminophen, alteplase, anticoagulant sodium citrate, heparin, ipratropium-albuterol, lidocaine (PF), lidocaine-prilocaine, ondansetron **OR** ondansetron (ZOFRAN) IV, pentafluoroprop-tetrafluoroeth, sodium chloride flush

## 2023-05-05 NOTE — Plan of Care (Signed)

## 2023-05-05 NOTE — Assessment & Plan Note (Signed)
 No acute coronary syndrome.

## 2023-05-05 NOTE — Assessment & Plan Note (Deleted)
 Echocardiogram with reduced LV systolic function with EF 30 to 35%, global hypokinesis, mild LVH, RV with moderate reduction in systolic function, RVSP 44.5 mmHg, LA with mild dilatation, no significant valvular disease.   Continue fluid removal with ultrafiltration through HD.  Continue with irbersartan with close follow up on serum K.  Close monitoring of blood pressure.

## 2023-05-05 NOTE — Progress Notes (Signed)
Pt receives out-pt HD at [facility] on MWF. Will assist as needed.   Olivia Canter Renal Navigator [phone]

## 2023-05-05 NOTE — Assessment & Plan Note (Addendum)
New diagnosis 1 month ago.  Remains in atrial fibrillation with controlled rate.  Patient has elected against anticoagulation due to history of epistaxis.  CHA2DS2-VASc score is at least 4 suggesting a 4.8% yearly stroke risk.  Risk versus benefits discussed with patient.  Recommended he discuss further with his cardiology team. -Continue Toprol-XL -Continue discussions regarding anticoagulation 

## 2023-05-05 NOTE — Plan of Care (Signed)

## 2023-05-05 NOTE — Progress Notes (Signed)
  Progress Note   Patient: Jacob Daniel ZOX:[id number] DOB: 1955/07/01 DOA: 05/04/2023     1 DOS: the patient was seen and examined on 05/05/2023   Brief hospital course: No notes on file  Assessment and Plan: * ESRD (end stage renal disease) on dialysis [facility]) Patient continue with volume overload.   BUN today is 53, K 4,1 and serum bicarbonate at 23  Na 136   Plan for hemodialysis with ultrafiltration today.  Continue with oral sodium bicarbonate.  Check abdominal US for possible ascites, if sufficient fluid can attempt paracentesis.   Failed renal transplant, continue with tacrolimus.  Follow up electrolytes and volume status.   Anemia of chronic renal disease.   Metabolic bone disease. Continue with calcium acetate.   Chronic systolic CHF (congestive heart failure) (HCC) Echocardiogram with reduced LV systolic function with EF 30 to 35%, global hypokinesis, mild LVH, RV with moderate reduction in systolic function, RVSP 44.5 mmHg, LA with mild dilatation, no significant valvular disease.   Continue fluid removal with ultrafiltration through HD.  Continue with irbersartan with close follow up on serum K.  Close monitoring of blood pressure.   History of CAD (coronary artery disease) No acute coronary syndrome.   Essential hypertension Uncontrolled blood pressure due to volume overload.  Plan to continue metoprolol and irbesartan.  For ultrafiltration today.   Hyperlipidemia Continue with statin.   Paroxysmal atrial fibrillation (HCC) Continue rate control with metoprolol and anticoagulation with apixaban.         Subjective: Patient with persistent dyspnea, and abdominal distention, not back to his baseline  Physical Exam: Vitals:   05/04/23 2330 05/05/23 0001 05/05/23 0300 05/05/23 0801  BP: (!) 160/101 (!) 145/109 131/87   Pulse: 73 79  73  Resp: (!) 32 18 (!) 23   Temp:  (!) 97.5 F (36.4 C)  97.6 F (36.4 C)  TempSrc:  Oral  Oral  SpO2: 90%    100%  Weight:  88.4 kg    Height:  6' (1.829 m)     Neurology awake and alert ENT with mild pallor with no icterus Cardiovascular with S1 and S2 present and regular with no gallops, rubs or murmurs Positive JVD Positive lower extremity edema +  Respiratory with mild bilateral rales with no wheezing or rhonchi Abdomen with positive distention, positive dullness to percussion at the dependent zones with no frank fluid wave  Data Reviewed:    Family Communication: no family at the bedside   Disposition: Status is: Inpatient Remains inpatient appropriate because: inpatient renal replacement therapy   Planned Discharge Destination: Home      Author: Coralie Keens, MD 05/05/2023 11:24 AM  For on call review [email or web address].

## 2023-05-05 NOTE — Assessment & Plan Note (Addendum)
 Blood pressure has improved with systolic in the 140 mmHg range.  Plan to continue metoprolol and irbesartan.  Further ultrafiltration per HD.

## 2023-05-05 NOTE — TOC Initial Note (Signed)
 Transition of Care [facility]) - Initial/Assessment Note    Patient Details  Name: Jacob Daniel MRN: [id number] Date of Birth: Mar 20, 1955  [facility]) CM/SW Contact:    Gala Lewandowsky, RN Phone Number: 05/05/2023, 12:39 PM  Clinical Narrative: Patient presented for new onset atrial fib. Hx ESRD-HD MWF. PTA patient reports that he was from home alone. Patient has transportation via [employer]	[place]. He states he drives himself to the grocery store if needed. Patient has DME cane an rolling walker in the home. PT/OT ordered for recommendations. Case Manager will continue to follow for additional transition of care needs.             Expected Discharge Plan: Home w Home Health Services Barriers to Discharge: Continued Medical Work up   Patient Goals and CMS Choice Patient states their goals for this hospitalization and ongoing recovery are:: to return home   Choice offered to / list presented to : NA      Expected Discharge Plan and Services In-house Referral: NA Discharge Planning Services: CM Consult   Living arrangements for the past 2 months: Apartment                   DME Agency: NA  Prior Living Arrangements/Services Living arrangements for the past 2 months: Apartment Lives with:: Self Patient language and need for interpreter reviewed:: Yes Do you feel safe going back to the place where you live?: Yes      Need for Family Participation in Patient Care: No (Comment) Care giver support system in place?: No (comment) Current home services: DME (cane and rolling walker) Criminal Activity/Legal Involvement Pertinent to Current Situation/Hospitalization: No - Comment as needed  Activities of Daily Living   ADL Screening (condition at time of admission) Independently performs ADLs?: Yes (appropriate for developmental age) Is the patient deaf or have difficulty hearing?: No Does the patient have difficulty seeing, even when wearing glasses/contacts?:  No Does the patient have difficulty concentrating, remembering, or making decisions?: No  Permission Sought/Granted Permission sought to share information with : Family Supports, Case Manager    Emotional Assessment Appearance:: Appears stated age Attitude/Demeanor/Rapport: Engaged Affect (typically observed): Appropriate Orientation: : Oriented to Self, Oriented to Place, Oriented to  Time, Oriented to Situation Alcohol / Substance Use: Not Applicable Psych Involvement: No (comment)  Admission diagnosis:  Volume overload [E87.70] Hypervolemia, unspecified hypervolemia type [E87.70] Acute hypoxic respiratory failure (HCC) [J96.01] Patient Active Problem List   Diagnosis Date Noted   ESRD (end stage renal disease) on dialysis (HCC) 05/05/2023   Volume overload 05/04/2023   History of CAD (coronary artery disease) 05/04/2023   Chronic dyspnea dyspnea 05/04/2023   Hyperlipidemia 05/04/2023   Paroxysmal atrial fibrillation (HCC) 05/04/2023   Chronic hypokalemia 05/04/2023   Chronic hypoxic respiratory failure (HCC) 05/04/2023   Atrial flutter with rapid ventricular response (HCC) 04/07/2023   RSV (respiratory syncytial virus pneumonia) 04/07/2023   Acute pulmonary edema (HCC) 04/07/2023   Atrial fibrillation with RVR (HCC) 04/07/2023   Acute on chronic systolic CHF (congestive heart failure) (HCC) 04/07/2023   Coronary artery calcification seen on CAT scan 04/07/2023   Pure hypercholesterolemia 04/07/2023   Pulmonary hypertension, unspecified (HCC) 04/07/2023   Pulmonary edema 01/17/2020   Erectile dysfunction 11/23/2019   Alcohol abuse 10/04/2019   Cannabis abuse 10/04/2019   Cardiomyopathy in diseases classified elsewhere (HCC) 10/04/2019   Chronic kidney disease, stage 3 unspecified (HCC) 10/04/2019   Dependence on renal dialysis (HCC) 10/04/2019   Hypomagnesemia  10/04/2019   Metabolic acidosis 10/04/2019   Obesity 10/04/2019   OSA (obstructive sleep apnea) 10/04/2019    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   Chronic systolic CHF (congestive heart failure) (HCC) 10/04/2019   End stage renal disease (HCC) 10/04/2019   Balance problem 05/26/2018   Chronic pain of right knee 05/26/2018   Right lumbar radiculitis 04/28/2018   Anemia 10/20/2017   History of colon polyps 10/20/2017   NSAID long-term use 10/20/2017   Positive fecal occult blood test 10/20/2017   Acute gout 09/08/2017   Chronic bilateral low back pain with right-sided sciatica 09/08/2017   Transplant rejection 04/16/2016   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 03/05/2015   Essential hypertension 09/08/2013   Sciatica 09/08/2013   History of renal transplant 08/10/2013   Type 2 diabetes mellitus with complications (HCC) 07/12/2013   PCP:  Oneita Hurt, No Pharmacy:   [id number] Ginette Otto, [place] - [address] AT [facility] OF [address] & [address] [place] [address] Phone: [phone] Fax: [phone]  [facility] [address] [place] [address] Phone: [phone] Fax: [phone]  Social Drivers of Health (SDOH) Social History: SDOH Screenings   Food Insecurity: No Food Insecurity (05/05/2023)  Housing: Low Risk  (05/05/2023)  Transportation Needs: No Transportation Needs (05/05/2023)  Utilities: Not At Risk (05/05/2023)  Financial Resource Strain: Low Risk  (11/05/2022)   Received from [facility]  Social Connections: Moderately Integrated (05/05/2023)  Tobacco Use: Low Risk  (05/04/2023)   Readmission Risk Interventions    05/05/2023   12:37 PM  Readmission Risk Prevention Plan  Transportation Screening Complete  Medication Review (RN Care Manager) Referral to Pharmacy  HRI or Home Care Consult Complete  SW Recovery Care/Counseling Consult Complete  Palliative Care Screening Not Applicable  Skilled Nursing Facility Not Applicable

## 2023-05-05 NOTE — Progress Notes (Signed)
 Patient states he has on too many wires. He refused to wear the pulse/ox, nasal cannula despite being [facility], and he has repeatedly asked to remove the heart monitor.

## 2023-05-05 NOTE — Assessment & Plan Note (Addendum)
 Improvement in volume status back to baseline.   Today renal function with BUN down to 37 with K at 3,9 and serum bicarbonate at 27  Na 136 and P 3,8   Patient had aggressive ultrafiltration through HD with improvement in his volume status.  SP paracentesis obtaining 4.7 L of fluid.   Failed renal transplant, continue with tacrolimus.   Anemia of chronic renal disease, with iron deficiency, continue iron supplementation.   Metabolic bone disease. Continue with sensipar, calcium acetate and calcitriol.  Oral sodium bicarbonate.

## 2023-05-05 NOTE — Progress Notes (Signed)
 Received patient in bed to unit.  Alert and oriented.  Informed consent signed and in chart.   TX duration:3.5  Patient tolerated well.  Transported back to the room  Alert, without acute distress.  Hand-off given to patient's nurse.   Access used: LAVF Access issues: none  Total UF removed: 3.5 Medication(s) given: none   05/05/23 1755  Vitals  Temp 97.8 F (36.6 C)  BP (!) 158/97  MAP (mmHg) 116  Pulse Rate 60  ECG Heart Rate 74  Resp 20  Oxygen Therapy  SpO2 100 %  During Treatment Monitoring  Blood Flow Rate (mL/min) 399 mL/min  Arterial Pressure (mmHg) -128.48 mmHg  Venous Pressure (mmHg) 272.51 mmHg  TMP (mmHg) 8.68 mmHg  Ultrafiltration Rate (mL/min) 1428 mL/min  Dialysate Flow Rate (mL/min) 300 ml/min  Dialysate Potassium Concentration 3  Dialysate Calcium Concentration 2.5  Duration of HD Treatment -hour(s) 3.48 hour(s)  Cumulative Fluid Removed (mL) per Treatment  0960.45  HD Safety Checks Performed Yes  Intra-Hemodialysis Comments Tx completed  Dialysis Fluid Bolus Normal Saline  Bolus Amount (mL) 300 mL  Post Treatment  Dialyzer Clearance Clear  Liters Processed 84  Fluid Removed (mL) 3500 mL  Tolerated HD Treatment Yes  AVG/AVF Arterial Site Held (minutes) 5 minutes  AVG/AVF Venous Site Held (minutes) 5 minutes  Fistula / Graft Left Upper arm  Placement Date/Time: 04/07/23 1900   Orientation: Left  Access Location: Upper arm  Site Condition No complications  Status Deaccessed      Freddi Starr, RN Kidney Dialysis Unit

## 2023-05-05 NOTE — Assessment & Plan Note (Signed)
 Continue with statin

## 2023-05-06 ENCOUNTER — Inpatient Hospital Stay (HOSPITAL_COMMUNITY): Payer: 59

## 2023-05-06 DIAGNOSIS — N186 End stage renal disease: Secondary | ICD-10-CM | POA: Diagnosis not present

## 2023-05-06 DIAGNOSIS — Z8679 Personal history of other diseases of the circulatory system: Secondary | ICD-10-CM | POA: Diagnosis not present

## 2023-05-06 DIAGNOSIS — I5022 Chronic systolic (congestive) heart failure: Secondary | ICD-10-CM | POA: Diagnosis not present

## 2023-05-06 DIAGNOSIS — I10 Essential (primary) hypertension: Secondary | ICD-10-CM | POA: Diagnosis not present

## 2023-05-06 HISTORY — PX: IR PARACENTESIS: IMG2679

## 2023-05-06 LAB — RENAL FUNCTION PANEL
Albumin: 2.3 g/dL — ABNORMAL LOW (ref 3.5–5.0)
Anion gap: 13 (ref 5–15)
BUN: 39 mg/dL — ABNORMAL HIGH (ref 8–23)
CO2: 27 mmol/L (ref 22–32)
Calcium: 8.3 mg/dL — ABNORMAL LOW (ref 8.9–10.3)
Chloride: 97 mmol/L — ABNORMAL LOW (ref 98–111)
Creatinine, Ser: 7.03 mg/dL — ABNORMAL HIGH (ref 0.61–1.24)
GFR, Estimated: 8 mL/min — ABNORMAL LOW (ref >60)
Glucose, Bld: 86 mg/dL (ref 70–99)
Phosphorus: 4.7 mg/dL — ABNORMAL HIGH (ref 2.5–4.6)
Potassium: 4.1 mmol/L (ref 3.5–5.1)
Sodium: 137 mmol/L (ref 135–145)

## 2023-05-06 LAB — PROTEIN, PLEURAL OR PERITONEAL FLUID: Total protein, fluid: 3.7 g/dL

## 2023-05-06 LAB — LACTATE DEHYDROGENASE, PLEURAL OR PERITONEAL FLUID: LD, Fluid: 83 U/L — ABNORMAL HIGH (ref 3–23)

## 2023-05-06 LAB — HEPATITIS B SURFACE ANTIBODY, QUANTITATIVE: Hep B S AB Quant (Post): 5337 m[IU]/mL

## 2023-05-06 MED ORDER — LIDOCAINE HCL 1 % IJ SOLN
INTRAMUSCULAR | Status: AC
  Filled 2023-05-06: qty 20

## 2023-05-06 MED ORDER — CALCITRIOL 0.5 MCG PO CAPS
1.7500 ug | ORAL_CAPSULE | ORAL | Status: DC
  Administered 2023-05-07: 1.75 ug via ORAL
  Filled 2023-05-06: qty 1

## 2023-05-06 MED ORDER — LIDOCAINE HCL 1 % IJ SOLN
20.0000 mL | Freq: Once | INTRAMUSCULAR | Status: AC
  Administered 2023-05-06: 10 mL via INTRADERMAL

## 2023-05-06 MED ORDER — CINACALCET HCL 30 MG PO TABS
30.0000 mg | ORAL_TABLET | ORAL | Status: DC
  Administered 2023-05-07: 30 mg via ORAL
  Filled 2023-05-06: qty 1

## 2023-05-06 NOTE — Plan of Care (Signed)

## 2023-05-06 NOTE — Evaluation (Signed)
 Occupational Therapy Evaluation Patient Details Name: Jacob Daniel MRN: [id number] DOB: Jul 02, 1955 Today's Date: 05/06/2023   History of Present Illness   Pt is a 68 y.o. male presenting 05/04/23 with abdominal pain and distention and SOB. Admitted with ESRD. Imaging: Moderate to large volume abdominopelvic ascites. PMH: CAD, ESRD, HTN, HLD, A-Fib, heart failure, pulmonary hypertension,  chronic hypokalemia, chronic physical debility and weakness, OSA, failed renal transplant     Clinical Impressions Pt most likely close to baseline level with ADL tasks and functional mobility for ADL. Educated pt on strategies to reduce risk of falls and recommended a Fall Alert system. Pt expressed no concerns regarding his ability to care for himself at DC and states he has friends who can assist as needed. Pt declined further mobility away from bed as he moves "when he wants to" and was busy watching [place]. No further OT needs.      If plan is discharge home, recommend the following:   Assistance with cooking/housework     Functional Status Assessment   Patient has not had a recent decline in their functional status     Equipment Recommendations   None recommended by OT     Recommendations for Other Services         Precautions/Restrictions   Precautions Precautions: Fall Restrictions Weight Bearing Restrictions Per Provider Order: No     Mobility Bed Mobility Overal bed mobility: Needs Assistance Bed Mobility: Supine to Sit, Sit to Supine     Supine to sit: Supervision, HOB elevated, Used rails Sit to supine: Supervision, HOB elevated, Used rails   General bed mobility comments: Supervision for safety and cueing for hand and trunk placement. Requires increased time    Transfers                   General transfer comment: declined further mobility      Balance                                           ADL either performed or  assessed with clinical judgement   ADL Overall ADL's : At baseline                                       General ADL Comments: Educated on strategies to reduce risk of falls' recommend fall alert system; no concners expressed regarding ability to take care of himself     Vision Baseline Vision/History: 0 No visual deficits       Perception         Praxis         Pertinent Vitals/Pain Pain Assessment Pain Assessment: Faces Faces Pain Scale: Hurts little more Pain Location: Low back and abdomen Pain Descriptors / Indicators: Guarding, Grimacing Pain Intervention(s): Limited activity within patient's tolerance     Extremity/Trunk Assessment Upper Extremity Assessment Upper Extremity Assessment: Overall WFL for tasks assessed   Lower Extremity Assessment Lower Extremity Assessment: Defer to PT evaluation   Cervical / Trunk Assessment Cervical / Trunk Assessment: Kyphotic   Communication Communication Communication: No apparent difficulties   Cognition Arousal: Alert Behavior During Therapy: WFL for tasks assessed/performed Cognition: No apparent impairments  Following commands: Intact       Cueing  General Comments          Exercises     Shoulder Instructions      Home Living Family/patient expects to be discharged to:: Private residence Living Arrangements: Alone Available Help at Discharge: Neighbor;Available PRN/intermittently;Personal care attendant (aid helps everyday,  for as long as he needs. Brings meals, cooks, cleaning) Type of Home: Apartment Home Access: Level entry     Home Layout: One level     Bathroom Shower/Tub: Walk-in shower;Tub/shower unit   Bathroom Toilet: Standard Bathroom Accessibility: Yes How Accessible: Accessible via walker Home Equipment: Pharmacist, hospital (2 wheels);Grab bars - toilet;Grab bars - tub/shower;Hand held shower head           Prior Functioning/Environment Prior Level of Function : Driving;History of Falls (last six months) (falls when trying to step up on curb at [employer])             Mobility Comments: mod I with intermittent SPC use ADLs Comments: B/D self, does own shopping, aide cleans house; has assistance from firends for IADL tasks; takes transportation or drives himself to HD    OT Problem List:     OT Treatment/Interventions:        OT Goals(Current goals can be found in the care plan section)   Acute Rehab OT Goals OT Goal Formulation: All assessment and education complete, DC therapy   OT Frequency:       Co-evaluation              AM-PAC OT "6 Clicks" Daily Activity     Outcome Measure Help from another person eating meals?: None Help from another person taking care of personal grooming?: None Help from another person toileting, which includes using toliet, bedpan, or urinal?: None Help from another person bathing (including washing, rinsing, drying)?: None Help from another person to put on and taking off regular upper body clothing?: None Help from another person to put on and taking off regular lower body clothing?: None 6 Click Score: 24   End of Session Nurse Communication: Other (comment) (no DC needs)  Activity Tolerance: Other (comment) (self limited) Patient left: in bed;with call bell/phone within reach;with bed alarm set  OT Visit Diagnosis: Muscle weakness (generalized) (M62.81)                Time: 9604-5409 OT Time Calculation (min): 10 min Charges:  OT General Charges $OT Visit: 1 Visit OT Evaluation $OT Eval Low Complexity: 1 Low  Luisa Dago, OT/L   Acute OT Clinical Specialist Acute Rehabilitation Services Pager [phone] Office [phone]   [facility] 05/06/2023, 2:27 PM

## 2023-05-06 NOTE — Plan of Care (Signed)
  Problem: Clinical Measurements: Goal: Will remain free from infection Outcome: Progressing Goal: Respiratory complications will improve Outcome: Progressing Goal: Cardiovascular complication will be avoided Outcome: Progressing   Problem: Activity: Goal: Risk for activity intolerance will decrease Outcome: Progressing   Problem: Nutrition: Goal: Adequate nutrition will be maintained Outcome: Progressing   Problem: Safety: Goal: Ability to remain free from injury will improve Outcome: Progressing   Problem: Nutritional: Goal: Ability to make healthy dietary choices will improve Outcome: Progressing

## 2023-05-06 NOTE — Progress Notes (Addendum)
 [facility] Progress Note   Subjective: Seen in room. Net UF 3.5 L yesterday but has ascites present. Says his breathing is much better. SR on monitor.   Objective Vitals:   05/05/23 1922 05/05/23 2052 05/05/23 2335 05/06/23 0304  BP: (!) 153/98  (!) 145/87 (!) 156/107  Pulse: 64  (!) 57 72  Resp: 18 (!) 21 20 16   Temp: (!) 97.5 F (36.4 C)  (!) 97.5 F (36.4 C) (!) 97.5 F (36.4 C)  TempSrc: Oral  Oral Oral  SpO2: 99% 98% 95% 95%  Weight:      Height:       Physical Exam General: Chronically ill appearing male in NAD Heart: S1,S2 RRR No M/R/G Lungs: CTAB slightly decreased in bases. No WOB Abdomen: distended probable ascites present. NABS Extremities: No LE edema Dialysis Access: L AVF aneurysmal + T/B    Additional Objective Labs: Basic Metabolic Panel: Recent Labs  Lab 05/04/23 1556 05/05/23 0120 05/06/23 0329  NA 138 136 137  K 4.1 4.1 4.1  CL 97* 99 97*  CO2 23 23 27   GLUCOSE 92 92 86  BUN 52* 53* 39*  CREATININE 8.67* 8.88* 7.03*  CALCIUM 8.1* 7.6* 8.3*  PHOS  --   --  4.7*   Liver Function Tests: Recent Labs  Lab 05/04/23 1556 05/05/23 0120 05/06/23 0329  AST 16 12*  --   ALT 15 12  --   ALKPHOS 86 71  --   BILITOT 1.8* 1.7*  --   PROT 7.7 7.1  --   ALBUMIN 2.5* 2.2* 2.3*   No results for input(s): "LIPASE", "AMYLASE" in the last 168 hours. CBC: Recent Labs  Lab 05/04/23 1556 05/05/23 0120  WBC 3.4* 3.2*  HGB 10.1* 8.5*  HCT 30.9* 25.8*  MCV 89.8 88.7  PLT 136* 137*   Blood Culture No results found for: "SDES", "SPECREQUEST", "CULT", "REPTSTATUS"  Cardiac Enzymes: No results for input(s): "CKTOTAL", "CKMB", "CKMBINDEX", "TROPONINI" in the last 168 hours. CBG: No results for input(s): "GLUCAP" in the last 168 hours. Iron Studies: No results for input(s): "IRON", "TIBC", "TRANSFERRIN", "FERRITIN" in the last 72 hours. @lablastinr3 @ Studies/Results: US Abdomen Limited Result Date: 05/05/2023 CLINICAL DATA:   [id number] Ascites [id number] EXAM: LIMITED ABDOMEN ULTRASOUND FOR ASCITES TECHNIQUE: Limited ultrasound survey for ascites was performed in all four abdominal quadrants. COMPARISON:  May 04, 2023 FINDINGS: Targeted ultrasound was performed of the 4 quadrants. There is a large volume of ascites. Bilateral pleural effusions are noted. IMPRESSION: Large volume of ascites. Electronically Signed   By: Meda Klinefelter M.D.   On: 05/05/2023 13:25   CT ABDOMEN PELVIS WO CONTRAST Result Date: 05/04/2023 CLINICAL DATA:  Acute abdominal pain. EXAM: CT ABDOMEN AND PELVIS WITHOUT CONTRAST TECHNIQUE: Multidetector CT imaging of the abdomen and pelvis was performed following the standard protocol without IV contrast. RADIATION DOSE REDUCTION: This exam was performed according to the departmental dose-optimization program which includes automated exposure control, adjustment of the mA and/or kV according to patient size and/or use of iterative reconstruction technique. COMPARISON:  CT 02/10/2022, images available but no report FINDINGS: Lower chest: The heart is enlarged. There are small bilateral pleural effusions. Septal thickening and ground-glass opacity typical of pulmonary edema. Hepatobiliary: No evidence of focal liver abnormality on this unenhanced exam. Potential but not definite capsular nodularity. Gallstones and probable sludge in the gallbladder. Ascites and motion artifact limit gallbladder assessment. The common bile duct is poorly defined. Pancreas: No ductal dilatation. Specific peripancreatic inflammation. Spleen: Normal  in size without focal abnormality. Adrenals/Urinary Tract: No adrenal nodule. Polycystic appearance of the kidneys with replacement of normal parenchyma by innumerable cysts. No hydronephrosis. Renal vascular calcifications, there may be additional small nonobstructing renal calculi. Right lower quadrant renal transplant which appears atrophic. No transplant hydronephrosis. Decompressed  urinary bladder. Stomach/Bowel: Mild gaseous gastric distension. No small bowel distension or evidence of obstruction. Colonic diverticulosis without evidence of diverticulitis. Detailed bowel assessment is limited due to motion and presence of ascites. Normal appendix is visualized. Vascular/Lymphatic: Aortic and branch atherosclerosis. No aortic aneurysm. No bulky abdominopelvic adenopathy on this limited exam. Reproductive: Mildly prominent prostate. Other: Moderate to large volume abdominopelvic ascites. Generalized edema of the subcutaneous and intra-abdominal fat. No free air. Musculoskeletal: L4-L5 and L5-S1 degenerative disc disease. There are no acute or suspicious osseous abnormalities. IMPRESSION: 1. Moderate to large volume abdominopelvic ascites. 2. Questionable but not definite capsular nodularity of the liver, recommend correlation for cirrhosis risk factors. 3. Polycystic appearance of the kidneys. Right lower quadrant renal transplant which appears atrophic. 4. Small bilateral pleural effusions. Features of pulmonary edema in the lung bases. Electronically Signed   By: Narda Rutherford M.D.   On: 05/04/2023 21:25   DG Chest 1 View Result Date: 05/04/2023 CLINICAL DATA:  Shortness of breath.  History of CHF. EXAM: CHEST  1 VIEW COMPARISON:  Chest radiograph dated 04/09/2023. FINDINGS: Stable cardiomediastinal silhouette. Aortic atherosclerosis. Bilateral perihilar interstitial prominence. No focal consolidation, sizeable pleural effusion, or pneumothorax. No acute osseous abnormality. IMPRESSION: Bilateral perihilar interstitial prominence suggestive of interstitial pulmonary edema. No focal consolidation. Electronically Signed   By: Hart Robinsons M.D.   On: 05/04/2023 17:57   Medications:   apixaban  5 mg Oral BID   aspirin EC  81 mg Oral Daily   atorvastatin  10 mg Oral Daily   calcium acetate  2,001 mg Oral TID WC   docusate sodium  100 mg Oral BID   ferrous sulfate  325 mg Oral Q  breakfast   irbesartan  300 mg Oral Daily   metoprolol succinate  75 mg Oral Daily   sodium bicarbonate  650 mg Oral BID   sodium chloride flush  3 mL Intravenous Q12H   sodium chloride flush  3 mL Intravenous Q12H   tacrolimus  1 mg Oral Daily     OP HD: [facility] MWF 4 hrs 400/600 90 kg 2.0 K/2.5 Ca  AVF  - Heparin 1000 units initial bolus then 600 units Hr/stop 1 hour before end of treatment (total 2800 units IV ) - Calcitriol 1.75 mcg PO three times per week  - Cinacalcet 30 mg PO three times per week  - Venofer 50 mg IV weekly - Micera 60 mcg IV q 2 weeks (last dose 04/26/2023 next dose due 05/10/2023)    Assessment/ Plan: Volume overload - w/ bilat LE edema and early IS edema on CXR. HD 02/19 Net UF 3.5 liters. Continue to optimize volume with HD, UF as tolerated.  HFrEF - last EF 35%. Continue to optimize volume with HD, UF as tolerated.  ESRD - on HD MWF. Next HD 05/07/2023 HTN - BP better controlled after volume removal. Resume home meds.  Volume - Improved post HD but has ascites. Needs paracentesis.  Anemia of esrd - HGB 8.5 this AM. ESA not due yet. Follow HGB. Transfuse if less than 7.0.   Secondary hyperparathyroidism - CCa in range. PO4 at goal..  PAF. Continue binders, sensipar, VDRA Hx of failed renal tranplant - remains  on low dose 1 mg prograf per day.  Chronic hypoxic resp faliure - uses 1-2 L O2 [place] at bases  Davaris Youtsey H. Rosaleigh Brazzel NP-C 05/06/2023, 7:51 AM  [employer] [phone]

## 2023-05-06 NOTE — Assessment & Plan Note (Addendum)
 Echocardiogram with reduced LV systolic function with EF 30 to 35%, global hypokinesis, mild LVH, RV with moderate reduction in systolic function, RVSP 44.5 mmHg, LA with mild dilatation, no significant valvular disease.   Biventricular failure.  Acute on chronic core pulmonale.  RV failure.   Continue fluid removal with ultrafiltration through HD.  Continue with irbersartan with close follow up on serum K.

## 2023-05-06 NOTE — Procedures (Signed)
 PROCEDURE SUMMARY:  Successful US guided paracentesis from right lateral abdomen.  Yielded 4.7 liters of clear yellow fluid.  No immediate complications.  Patient tolerated well.  EBL = trace  Specimen was sent for labs.  Loman Brooklyn PA-C 05/06/2023 10:04 AM

## 2023-05-06 NOTE — Evaluation (Signed)
 Physical Therapy Evaluation Patient Details Name: Jacob Daniel MRN: [id number] DOB: November 19, 1955 Today's Date: 05/06/2023  History of Present Illness  Pt is a 68 y.o. male presenting 05/04/23 with abdominal pain and distention and SOB. Admitted with ESRD. Imaging: Moderate to large volume abdominopelvic ascites. PMH: CAD, ESRD, HTN, HLD, A-Fib, heart failure, pulmonary hypertension,  chronic hypokalemia, chronic physical debility and weakness, OSA, failed renal transplant  Clinical Impression  Pt is a 68 y.o. male presenting on 05/04/23 with above conditions and deficits below, see PT Problem List. PTA pt lived alone with intermittent SPC use in an apartment. Pt received assistance from a personal care attendant and the occasional friend. Currently is supervision for bed mobility and CGA for transfers. Eval limited s/t pt self limiting the session to only taking 3-4 steps away from the bed before declining to go further and return to bed. Pt displays deficits in activity tolerance, functional mobility, balance, gait, and LE strength and would benefit from continued acute PT and follow up PT. Given pt's PLOF, available assistance, and anticipated prognosis pt would benefit from HHPT upon d/c. Pt expressed disinterest in HHPT s/t "not wanting to be told what to do by physical therapists". Pt education regarding role of HHPT and acute PT provided. Will continue to follow acutely.            If plan is discharge home, recommend the following: A little help with walking and/or transfers;A little help with bathing/dressing/bathroom;Assistance with cooking/housework;Assist for transportation   Can travel by private vehicle        Equipment Recommendations Rolling walker (2 wheels)  Recommendations for Other Services       Functional Status Assessment Patient has had a recent decline in their functional status and demonstrates the ability to make significant improvements in function in a reasonable  and predictable amount of time.     Precautions / Restrictions Precautions Precautions: Fall Recall of Precautions/Restrictions: Intact Restrictions Weight Bearing Restrictions Per Provider Order: No      Mobility  Bed Mobility Overal bed mobility: Needs Assistance Bed Mobility: Supine to Sit, Sit to Supine     Supine to sit: Supervision, HOB elevated, Used rails Sit to supine: Supervision, HOB elevated, Used rails   General bed mobility comments: Supervision for safety and cueing for hand and trunk placement. Requires increased time    Transfers Overall transfer level: Needs assistance Equipment used: Rolling walker (2 wheels), None Transfers: Sit to/from Stand, Bed to chair/wheelchair/BSC Sit to Stand: Contact guard assist   Step pivot transfers: Contact guard assist       General transfer comment: Pt required increased time to stand and cueing for hand placement on bed.    Ambulation/Gait     Assistive device: Rolling walker (2 wheels)         General Gait Details: Pt refused to walk away from the bed s/t "having a surgery to drain fluid yesterday". With RW able to walk sideways to move higher in bed with equal weight shift and no LOB.  Stairs            Wheelchair Mobility     Tilt Bed    Modified Rankin (Stroke Patients Only)       Balance Overall balance assessment: Needs assistance Sitting-balance support: No upper extremity supported, Feet supported Sitting balance-Leahy Scale: Good Sitting balance - Comments: Pt sits EOB without UE support with anterior lean. Prefers to sit with Bil UE. No LOB. When reaching outside BOS complains of  back pain when reaching left   Standing balance support: Reliant on assistive device for balance, During functional activity, Bilateral upper extremity supported Standing balance-Leahy Scale: Fair Standing balance comment: Pt stands with RW, equal weight bearing. No LOB                              Pertinent Vitals/Pain Pain Assessment Pain Assessment: Faces Faces Pain Scale: Hurts little more Pain Location: Low back and abdomen Pain Descriptors / Indicators: Guarding, Grimacing Pain Intervention(s): Monitored during session    Home Living Family/patient expects to be discharged to:: Private residence Living Arrangements: Alone Available Help at Discharge: Neighbor;Available PRN/intermittently;Personal care attendant (aid helps everyday,  for as long as he needs. Brings meals, cooks, cleaning) Type of Home: Apartment Home Access: Level entry       Home Layout: One level Home Equipment: Pharmacist, hospital (2 wheels);Grab bars - toilet;Grab bars - tub/shower;Hand held shower head      Prior Function Prior Level of Function : Driving;History of Falls (last six months) (falls when trying to step up on curb at [employer])             Mobility Comments: mod I with intermittent SPC use ADLs Comments: B/D self, does own shopping, aide cleans house     Extremity/Trunk Assessment   Upper Extremity Assessment Upper Extremity Assessment: Defer to OT evaluation    Lower Extremity Assessment Lower Extremity Assessment: Generalized weakness;RLE deficits/detail;LLE deficits/detail RLE Deficits / Details: Gross MMT 4/5 LLE Deficits / Details: Gross MMT 4/5    Cervical / Trunk Assessment Cervical / Trunk Assessment: Kyphotic  Communication   Communication Communication: No apparent difficulties    Cognition Arousal: Alert Behavior During Therapy: WFL for tasks assessed/performed   PT - Cognitive impairments: No apparent impairments                         Following commands: Intact       Cueing Cueing Techniques: Verbal cues, Visual cues     General Comments General comments (skin integrity, edema, etc.): does [place] regularly    Exercises     Assessment/Plan    PT Assessment Patient needs continued PT services  PT Problem List  Decreased strength;Decreased range of motion;Decreased activity tolerance;Decreased balance;Decreased mobility;Decreased coordination;Decreased knowledge of use of DME;Decreased safety awareness;Decreased knowledge of precautions;Cardiopulmonary status limiting activity;Pain       PT Treatment Interventions DME instruction;Gait training;Stair training;Functional mobility training;Therapeutic activities;Therapeutic exercise;Balance training;Neuromuscular re-education;Patient/family education    PT Goals (Current goals can be found in the Care Plan section)  Acute Rehab PT Goals Patient Stated Goal: return home PT Goal Formulation: With patient Time For Goal Achievement: 05/20/23 Potential to Achieve Goals: Fair    Frequency Min 1X/week     Co-evaluation               AM-PAC PT "6 Clicks" Mobility  Outcome Measure Help needed turning from your back to your side while in a flat bed without using bedrails?: A Little Help needed moving from lying on your back to sitting on the side of a flat bed without using bedrails?: A Little Help needed moving to and from a bed to a chair (including a wheelchair)?: A Little Help needed standing up from a chair using your arms (e.g., wheelchair or bedside chair)?: A Little Help needed to walk in hospital room?: Total Help needed climbing 3-5 steps  with a railing? : Total 6 Click Score: 14    End of Session Equipment Utilized During Treatment: Gait belt Activity Tolerance: Patient limited by pain Patient left: in bed;with call bell/phone within reach;with bed alarm set Nurse Communication: Mobility status PT Visit Diagnosis: Unsteadiness on feet (R26.81);Other abnormalities of gait and mobility (R26.89);Muscle weakness (generalized) (M62.81);History of falling (Z91.81);Difficulty in walking, not elsewhere classified (R26.2);Pain Pain - part of body:  (Back)    Time: 1030-1100 PT Time Calculation (min) (ACUTE ONLY): 30 min   Charges:   PT  Evaluation $PT Eval Moderate Complexity: 1 Mod PT Treatments $Therapeutic Activity: 8-22 mins PT General Charges $$ ACUTE PT VISIT: 1 Visit         [address], SPT   [place] 05/06/2023, 3:17 PM

## 2023-05-06 NOTE — TOC Progression Note (Signed)
 Transition of Care [facility]) - Progression Note    Patient Details  Name: Jacob Daniel MRN: [id number] Date of Birth: May 27, 1955  [facility] CM/SW Contact  Graves-Bigelow, Lamar Laundry, RN Phone Number: 05/06/2023, 3:29 PM  Clinical Narrative: Case Manager spoke with patient post PT/OT recommendations. Patient is declining home health services at this time. Case Manager did make physician aware. No further needs identified at this time.     Expected Discharge Plan: Home/Self Care Barriers to Discharge: No Barriers Identified  Expected Discharge Plan and Services In-house Referral: NA Discharge Planning Services: CM Consult   Living arrangements for the past 2 months: Apartment                   DME Agency: NA       HH Arranged: Refused HH   Social Determinants of Health (SDOH) Interventions SDOH Screenings   Food Insecurity: No Food Insecurity (05/05/2023)  Housing: Low Risk  (05/05/2023)  Transportation Needs: No Transportation Needs (05/05/2023)  Utilities: Not At Risk (05/05/2023)  Financial Resource Strain: Low Risk  (11/05/2022)   Received from [facility]  Social Connections: Moderately Integrated (05/05/2023)  Tobacco Use: Low Risk  (05/04/2023)   Readmission Risk Interventions    05/05/2023   12:37 PM  Readmission Risk Prevention Plan  Transportation Screening Complete  Medication Review (RN Care Manager) Referral to Pharmacy  HRI or Home Care Consult Complete  SW Recovery Care/Counseling Consult Complete  Palliative Care Screening Not Applicable  Skilled Nursing Facility Not Applicable

## 2023-05-06 NOTE — Hospital Course (Addendum)
 Mr. Leete was admitted to the hospital with the working diagnosis of volume overload in the setting of ESRD.   68 yo male with the past medical history of ESRD on HD, MWF, failed renal transplant, hypertension, hyperlipidemia, atrial fibrillation, heart failure, and pulmonary hypertension who presented with dyspnea and abdominal distention.  Apparently he missed one HD session the week prior to hospitalization.  On his initial physical examination his blood pressure was 163/105, HR 79, RR 32 and 02 saturation 90%.  Lungs with bilateral rales with no wheezing, heart with S1 and S2 present and regular with no gallops, abdomen distended and dull to percussion, non tender, positive lower extremity edema.   Na 138, K 4,1 CL 97 bicarbonate 23, glucose 92, bun 52, cr 8,67  AST 16 ALT 15  BNP >4,500  Wbc 3,4 hgb 10.1 plt 136   Sars covid 19 negative Influenza negative   CT abdomen and pelvis with moderate to large volume abdominopelvic ascites.  Questionable nodularity of the liver.  Polycystic appearance of the kidneys, right lower quadrant renal transplant with appears atrophic.  Small bilateral pleural effusions.   Chest radiograph with bilateral hilar vascular congestion, with bilateral cephalization of the vasculature, bilateral central interstitial infiltrates.   EKG 73 bpm, normal axis, qtc 511, sinus rhythm with no significant ST segment or T wave changes.   02/19 patient had HD with ultrafiltration.  02/20 paracentesis US guided with 4,7 L removed.  02/21 HD with ultrafiltration.  02/22 patient with euvolemic state, continue with HD schedule MWF as outpatient.

## 2023-05-06 NOTE — Progress Notes (Addendum)
 Progress Note   Patient: Jacob Daniel ZOX:[id number] DOB: 18-Apr-1955 DOA: 05/04/2023     2 DOS: the patient was seen and examined on 05/06/2023   Brief hospital course: Mr. Broughton was admitted to the hospital with the working diagnosis of volume overload in the setting of ESRD.   68 yo male with the past medical history of ESRD on HD, MWF, failed renal transplant, hypertension, hyperlipidemia, atrial fibrillation, heart failure, and pulmonary hypertension who presented with dyspnea and abdominal distention.  Apparently he missed on HD session the week prior to hospitalization.  On his initial physical examination his blood pressure was 163/105, HR 79, RR 32 and 02 saturation 90%.  Lungs with bilateral rales with no wheezing, heart with S1 and S2 present and regular with no gallops, abdomen distended and dull to percussion, non tender, positive lower extremity edema.   Na 138, K 4,1 CL 97 bicarbonate 23, glucose 92, bun 52, cr 8,67  AST 16 ALT 15  BNP >4,500  Wbc 3,4 hgb 10.1 plt 136   Sars covid 19 negative Influenza negative   CT abdomen and pelvis with moderate to large volume abdominopelvic ascites.  Questionable nodularity of the liver.  Polycystic appearance of the kidneys, right lower quadrant renal transplant with appears atrophic.  Small bilateral pleural effusions.   Chest radiograph with bilateral hilar vascular congestion, with bilateral cephalization of the vasculature, bilateral central interstitial infiltrates.   EKG 73 bpm, normal axis, qtc 511, sinus rhythm with no significant ST segment or T wave changes.   02/19 patient had HD with ultrafiltration.  02/20 paracentesis US guided with 4,7 L removed.     Assessment and Plan: * ESRD (end stage renal disease) on dialysis (HCC) Improvement in volume status but not back to baseline.   Post HD bun is down to 39, K is 4,1 and serum bicarbonate at 4.1  Na 137 and P 4,7   Plan for hemodialysis with ultrafiltration,  next will be 02/21   Continue with oral sodium bicarbonate.  Plan for paracentesis today, which was performed, obtaining 4.7 L of fluid.   Failed renal transplant, continue with tacrolimus.  Follow up electrolytes and volume status.   Anemia of chronic renal disease, with iron deficiency, continue iron supplementation.   Metabolic bone disease. Continue with sensipar, calcium acetate and calcitriol.   Acute on chronic systolic CHF (congestive heart failure) (HCC) Echocardiogram with reduced LV systolic function with EF 30 to 35%, global hypokinesis, mild LVH, RV with moderate reduction in systolic function, RVSP 44.5 mmHg, LA with mild dilatation, no significant valvular disease.   Biventricular failure.  Acute on chronic core pulmonale.  RV failure.   Continue fluid removal with ultrafiltration through HD.  Continue with irbersartan with close follow up on serum K.  Tolerated well paracentesis.   History of CAD (coronary artery disease) No acute coronary syndrome.   Essential hypertension Blood pressure has improved with systolic in the 140 mmHg range.  Plan to continue metoprolol and irbesartan.  For ultrafiltration per HD.   Hyperlipidemia Continue with statin.   Paroxysmal atrial fibrillation (HCC) Continue rate control with metoprolol and anticoagulation with apixaban.    Subjective: Patient feeling better after HD yesterday, improved dyspnea, today with no chest pain, continue with abdominal distention.   Physical Exam: Vitals:   05/05/23 2335 05/06/23 0304 05/06/23 0807 05/06/23 0850  BP: (!) 145/87 (!) 156/107 (!) 141/91 (!) 148/94  Pulse: (!) 57 72 63   Resp: 20 16  Temp: (!) 97.5 F (36.4 C) (!) 97.5 F (36.4 C) (!) 97.5 F (36.4 C)   TempSrc: Oral Oral Oral   SpO2: 95% 95% 94%   Weight:      Height:       Neurology awake and alert ENT with mild pallor Cardiovascular with S1 and S2 present and regular with no gallops or rubs with no gallops, rubs or  murmurs Mild JVD Trace lower extremity edema Respiratory with mild rales at bases with no wheezing or rhonchi Abdomen with distention and dullness to percussion  Data Reviewed:    Family Communication: no family at the bedside   Disposition: Status is: Inpatient Remains inpatient appropriate because: volume management   Planned Discharge Destination: Home    Author: Coralie Keens, MD 05/06/2023 10:12 AM  For on call review [email or web address].

## 2023-05-07 DIAGNOSIS — R9431 Abnormal electrocardiogram [ECG] [EKG]: Secondary | ICD-10-CM | POA: Diagnosis not present

## 2023-05-07 DIAGNOSIS — I5023 Acute on chronic systolic (congestive) heart failure: Secondary | ICD-10-CM

## 2023-05-07 DIAGNOSIS — N186 End stage renal disease: Secondary | ICD-10-CM | POA: Diagnosis not present

## 2023-05-07 DIAGNOSIS — Z8679 Personal history of other diseases of the circulatory system: Secondary | ICD-10-CM | POA: Diagnosis not present

## 2023-05-07 LAB — CBC
HCT: 32.5 % — ABNORMAL LOW (ref 39.0–52.0)
Hemoglobin: 10.7 g/dL — ABNORMAL LOW (ref 13.0–17.0)
MCH: 29.2 pg (ref 26.0–34.0)
MCHC: 32.9 g/dL (ref 30.0–36.0)
MCV: 88.8 fL (ref 80.0–100.0)
Platelets: 202 10*3/uL (ref 150–400)
RBC: 3.66 MIL/uL — ABNORMAL LOW (ref 4.22–5.81)
RDW: 17.5 % — ABNORMAL HIGH (ref 11.5–15.5)
WBC: 5.5 10*3/uL (ref 4.0–10.5)
nRBC: 0.5 % — ABNORMAL HIGH (ref 0.0–0.2)

## 2023-05-07 LAB — RENAL FUNCTION PANEL
Albumin: 2.3 g/dL — ABNORMAL LOW (ref 3.5–5.0)
Anion gap: 20 — ABNORMAL HIGH (ref 5–15)
BUN: 57 mg/dL — ABNORMAL HIGH (ref 8–23)
CO2: 21 mmol/L — ABNORMAL LOW (ref 22–32)
Calcium: 8.1 mg/dL — ABNORMAL LOW (ref 8.9–10.3)
Chloride: 92 mmol/L — ABNORMAL LOW (ref 98–111)
Creatinine, Ser: 8.45 mg/dL — ABNORMAL HIGH (ref 0.61–1.24)
GFR, Estimated: 6 mL/min — ABNORMAL LOW (ref >60)
Glucose, Bld: 64 mg/dL — ABNORMAL LOW (ref 70–99)
Phosphorus: 5.3 mg/dL — ABNORMAL HIGH (ref 2.5–4.6)
Potassium: 4.6 mmol/L (ref 3.5–5.1)
Sodium: 133 mmol/L — ABNORMAL LOW (ref 135–145)

## 2023-05-07 MED ORDER — HEPARIN SODIUM (PORCINE) 1000 UNIT/ML DIALYSIS
2500.0000 [IU] | Freq: Once | INTRAMUSCULAR | Status: AC
  Administered 2023-05-07: 2500 [IU] via INTRAVENOUS_CENTRAL
  Filled 2023-05-07: qty 3

## 2023-05-07 MED ORDER — HEPARIN SODIUM (PORCINE) 1000 UNIT/ML DIALYSIS: 1000.0000 [IU] | INTRAMUSCULAR | Status: DC | PRN

## 2023-05-07 MED ORDER — LIDOCAINE-PRILOCAINE 2.5-2.5 % EX CREA: 1.0000 | TOPICAL_CREAM | CUTANEOUS | Status: DC | PRN

## 2023-05-07 MED ORDER — LIDOCAINE HCL (PF) 1 % IJ SOLN: 5.0000 mL | INTRAMUSCULAR | Status: DC | PRN

## 2023-05-07 MED ORDER — PENTAFLUOROPROP-TETRAFLUOROETH EX AERO: 1.0000 | INHALATION_SPRAY | CUTANEOUS | Status: DC | PRN

## 2023-05-07 NOTE — Progress Notes (Signed)
 PT Cancellation Note  Patient Details Name: Jacob Daniel MRN: [id number] DOB: 1955/07/22   Cancelled Treatment:    Reason Eval/Treat Not Completed: Patient declined, no reason specified Pt provided education regarding importance of working with therapists s/t hx of falls and decreased functional mobility. However pt became frustrated and continued to refuse to work with therapists. Will plan to follow up another day as able.   [id number] [place], SPT   [place] [name] 05/07/2023, 4:45 PM

## 2023-05-07 NOTE — Progress Notes (Signed)
 PT Cancellation Note  Patient Details Name: Jacob Daniel MRN: [id number] DOB: [birth year]   Cancelled Treatment:    Reason Eval/Treat Not Completed: Patient at procedure or test/unavailable Pt at HD will re-attempt as time permits.  [address] [place], SPT   [place] Piya Mesch 05/07/2023, 10:37 AM

## 2023-05-07 NOTE — Progress Notes (Addendum)
 Progress Note   Patient: Jacob Daniel VZD:[id number] DOB: May 20, 1955 DOA: 05/04/2023     3 DOS: the patient was seen and examined on 05/07/2023   Brief hospital course: Mr. Mceuen was admitted to the hospital with the working diagnosis of volume overload in the setting of ESRD.   68 yo male with the past medical history of ESRD on HD, MWF, failed renal transplant, hypertension, hyperlipidemia, atrial fibrillation, heart failure, and pulmonary hypertension who presented with dyspnea and abdominal distention.  Apparently he missed on HD session the week prior to hospitalization.  On his initial physical examination his blood pressure was 163/105, HR 79, RR 32 and 02 saturation 90%.  Lungs with bilateral rales with no wheezing, heart with S1 and S2 present and regular with no gallops, abdomen distended and dull to percussion, non tender, positive lower extremity edema.   Na 138, K 4,1 CL 97 bicarbonate 23, glucose 92, bun 52, cr 8,67  AST 16 ALT 15  BNP >4,500  Wbc 3,4 hgb 10.1 plt 136   Sars covid 19 negative Influenza negative   CT abdomen and pelvis with moderate to large volume abdominopelvic ascites.  Questionable nodularity of the liver.  Polycystic appearance of the kidneys, right lower quadrant renal transplant with appears atrophic.  Small bilateral pleural effusions.   Chest radiograph with bilateral hilar vascular congestion, with bilateral cephalization of the vasculature, bilateral central interstitial infiltrates.   EKG 73 bpm, normal axis, qtc 511, sinus rhythm with no significant ST segment or T wave changes.   02/19 patient had HD with ultrafiltration.  02/20 paracentesis US guided with 4,7 L removed.     Assessment and Plan: * ESRD (end stage renal disease) on dialysis (HCC) Improvement in volume status but not back to baseline.   Pre HD Na 133, BUN 57, K 4,6 and serum bicarbonate 21  P 5.3   Continue inpatient renal replacement therapy per nephrology  recommendations.  Continue with oral sodium bicarbonate.  SP paracentesis obtaining 4.7 L of fluid.   Failed renal transplant, continue with tacrolimus.   Anemia of chronic renal disease, with iron deficiency, continue iron supplementation.   Metabolic bone disease. Continue with sensipar, calcium acetate and calcitriol.  Oral sodium bicarbonate.   Acute on chronic systolic CHF (congestive heart failure) (HCC) Echocardiogram with reduced LV systolic function with EF 30 to 35%, global hypokinesis, mild LVH, RV with moderate reduction in systolic function, RVSP 44.5 mmHg, LA with mild dilatation, no significant valvular disease.   Biventricular failure.  Acute on chronic core pulmonale.  RV failure.   Continue fluid removal with ultrafiltration through HD.  Continue with irbersartan with close follow up on serum K.  Tolerated well paracentesis.   History of CAD (coronary artery disease) No acute coronary syndrome.   Essential hypertension Blood pressure has improved with systolic in the 140 mmHg range.  Plan to continue metoprolol and irbesartan.  For ultrafiltration per HD.   Hyperlipidemia Continue with statin.   Paroxysmal atrial fibrillation (HCC) Continue rate control with metoprolol and anticoagulation with apixaban.  Ok to discontinue telemetry.       Subjective: Patient is feeling better, dyspnea has improved, along with abdominal distention   Physical Exam: Vitals:   05/07/23 1200 05/07/23 1217 05/07/23 1220 05/07/23 1311  BP: (!) 156/82 (!) 152/93  (!) 152/89  Pulse: (!) 58 (!) 57 60 (!) 57  Resp: (!) 25 16 19  (!) 25  Temp:  (!) 97.3 F (36.3 C)  TempSrc:      SpO2: 100% 97% 99% 100%  Weight:    82.2 kg  Height:       Neurology awake and alert ENT with mild pallor Cardiovascular with S1 and S2 present and regular with no gallops, rubs or murmurs No JVD No lower extremity edema  Respiratory with no rales or wheezing, no rhonchi Abdomen with no  distention  Data Reviewed:    Family Communication: no family at the bedside   Disposition: Status is: Inpatient Remains inpatient appropriate because: pending final recommendations from nephrology   Planned Discharge Destination: Home      Author: Coralie Keens, MD 05/07/2023 3:18 PM  For on call review [email or web address].

## 2023-05-07 NOTE — Progress Notes (Signed)
 [facility] Progress Note   Subjective:    Seen and examined patient on HD. Tolerating UFG 2.5L. BP is 155/95. S/p paracentesis yesterday and 4.7L removed.  Objective Vitals:   05/07/23 0930 05/07/23 1000 05/07/23 1030 05/07/23 1100  BP: (!) 159/96 (!) 159/92 (!) 149/81 (!) 152/86  Pulse: (!) 59 (!) 55 (!) 54 (!) 54  Resp: (!) 24 (!) 23 19 (!) 28  Temp:      TempSrc:      SpO2: 93% 100% 95% 95%  Weight:      Height:       Physical Exam  General: Chronically ill appearing male in NAD Heart: S1,S2 RRR No M/R/G Lungs: CTAB slightly decreased in bases. No WOB Abdomen: distended probable ascites present. NABS Extremities: No LE edema Dialysis Access: L AVF aneurysmal + T/B  Filed Weights   05/05/23 1521 05/05/23 1820 05/07/23 0827  Weight: 90 kg 86.4 kg 84.7 kg   No intake or output data in the 24 hours ending 05/07/23 1115  Additional Objective Labs: Basic Metabolic Panel: Recent Labs  Lab 05/05/23 0120 05/06/23 0329 05/07/23 0744  NA 136 137 133*  K 4.1 4.1 4.6  CL 99 97* 92*  CO2 23 27 21*  GLUCOSE 92 86 64*  BUN 53* 39* 57*  CREATININE 8.88* 7.03* 8.45*  CALCIUM 7.6* 8.3* 8.1*  PHOS  --  4.7* 5.3*   Liver Function Tests: Recent Labs  Lab 05/04/23 1556 05/05/23 0120 05/06/23 0329 05/07/23 0744  AST 16 12*  --   --   ALT 15 12  --   --   ALKPHOS 86 71  --   --   BILITOT 1.8* 1.7*  --   --   PROT 7.7 7.1  --   --   ALBUMIN 2.5* 2.2* 2.3* 2.3*   No results for input(s): "LIPASE", "AMYLASE" in the last 168 hours. CBC: Recent Labs  Lab 05/04/23 1556 05/05/23 0120 05/07/23 0744  WBC 3.4* 3.2* 5.5  HGB 10.1* 8.5* 10.7*  HCT 30.9* 25.8* 32.5*  MCV 89.8 88.7 88.8  PLT 136* 137* 202   Blood Culture No results found for: "SDES", "SPECREQUEST", "CULT", "REPTSTATUS"  Cardiac Enzymes: No results for input(s): "CKTOTAL", "CKMB", "CKMBINDEX", "TROPONINI" in the last 168 hours. CBG: No results for input(s): "GLUCAP" in the last 168  hours. Iron Studies: No results for input(s): "IRON", "TIBC", "TRANSFERRIN", "FERRITIN" in the last 72 hours. Lab Results  Component Value Date   INR 1.4 (H) 05/05/2023   Studies/Results: IR Paracentesis Result Date: 05/06/2023 INDICATION: 68 year old male with end stage renal disease an volume overload for diagnostic and therapeutic thoracentesis. EXAM: ULTRASOUND GUIDED RIGHT PARACENTESIS MEDICATIONS: 10 mL 1% lidocaine COMPLICATIONS: None immediate. PROCEDURE: Informed written consent was obtained from the patient after a discussion of the risks, benefits and alternatives to treatment. A timeout was performed prior to the initiation of the procedure. Initial ultrasound scanning demonstrates a large amount of ascites within the right lower abdominal quadrant. The right lower abdomen was prepped and draped in the usual sterile fashion. 1% lidocaine was used for local anesthesia. Following this, a 19 gauge, 7-cm, Yueh catheter was introduced. An ultrasound image was saved for documentation purposes. The paracentesis was performed. The catheter was removed and a dressing was applied. The patient tolerated the procedure well without immediate post procedural complication. FINDINGS: A total of approximately 4.7 L of clear yellow fluid was removed. Samples were sent to the laboratory as requested by the clinical  team. IMPRESSION: Successful ultrasound-guided paracentesis yielding 4.7 liters of peritoneal fluid. PLAN: Pt with end stage renal disease and ascites. Performed By Theresa Mulligan, PA-C Electronically Signed   By: Marliss Coots M.D.   On: 05/06/2023 12:10    Medications:   apixaban  5 mg Oral BID   aspirin EC  81 mg Oral Daily   atorvastatin  10 mg Oral Daily   calcitRIOL  1.75 mcg Oral Q M,W,F-HD   calcium acetate  2,001 mg Oral TID WC   cinacalcet  30 mg Oral Q M,W,F-HD   docusate sodium  100 mg Oral BID   ferrous sulfate  325 mg Oral Q breakfast   irbesartan  300 mg Oral Daily    metoprolol succinate  75 mg Oral Daily   sodium bicarbonate  650 mg Oral BID   sodium chloride flush  3 mL Intravenous Q12H   sodium chloride flush  3 mL Intravenous Q12H   tacrolimus  1 mg Oral Daily    Dialysis Orders:  [facility] MWF 4 hrs 400/600 90 kg 2.0 K/2.5 Ca  AVF  - Heparin 1000 units initial bolus then 600 units Hr/stop 1 hour before end of treatment (total 2800 units IV ) - Calcitriol 1.75 mcg PO three times per week  - Cinacalcet 30 mg PO three times per week  - Venofer 50 mg IV weekly - Micera 60 mcg IV q 2 weeks (last dose 04/26/2023 next dose due 05/10/2023)  Assessment/Plan: Volume overload - w/ bilat LE edema and early IS edema on CXR. HD 02/19 Net UF 3.5 liters. Continue to optimize volume with HD, UF as tolerated.  HFrEF - last EF 35%. Continue to optimize volume with HD, UF as tolerated.  ESRD - on HD MWF. On HD.  HTN - BP better controlled after volume removal. Resume home meds.  Volume - Improved post HD but has ascites. S/p paracentesis by IR 2/20-4.7L removed.  Anemia of esrd - HGB 8.5 this AM. ESA not due yet. Follow HGB. Transfuse if less than 7.0.   Secondary hyperparathyroidism - CCa in range. PO4 at goal..  PAF. Continue binders, sensipar, VDRA Hx of failed renal tranplant - remains on low dose 1 mg prograf per day.  Chronic hypoxic resp faliure - uses 1-2 L O2 [place] at bases  Salome Holmes, NP [facility] 05/07/2023,11:15 AM  LOS: 3 days

## 2023-05-07 NOTE — Procedures (Signed)
 Received patient in bed to unit.  Alert and oriented.  Informed consent signed and in chart.   TX duration: 3.5 hours  Patient tolerated well.  Transported back to the room  Alert, without acute distress.  Hand-off given to patient's nurse.   Access used: left fistula Access issues: none  Total UF removed: 2.5 liters Medication(s) given: none  Lu Duffel, RN [facility]

## 2023-05-07 NOTE — Plan of Care (Signed)
  Problem: Clinical Measurements: Goal: Respiratory complications will improve Outcome: Progressing Goal: Cardiovascular complication will be avoided Outcome: Progressing   Problem: Activity: Goal: Risk for activity intolerance will decrease Outcome: Progressing   Problem: Nutrition: Goal: Adequate nutrition will be maintained Outcome: Progressing   Problem: Nutritional: Goal: Ability to make healthy dietary choices will improve Outcome: Progressing

## 2023-05-07 NOTE — Plan of Care (Signed)

## 2023-05-07 NOTE — Progress Notes (Signed)
 Patient is refusing to take his pills (eliquis, docusate, sodium bicarbonate). Explained to him the importance of taking his pills. Patient states "stop telling someone what they need to do, you know better than that". Re-attempted to give his pills twice but patient still refused. Notified Dr. Joneen Roach.  Felicity Coyer, RN

## 2023-05-08 DIAGNOSIS — N186 End stage renal disease: Secondary | ICD-10-CM | POA: Diagnosis not present

## 2023-05-08 DIAGNOSIS — I10 Essential (primary) hypertension: Secondary | ICD-10-CM | POA: Diagnosis not present

## 2023-05-08 DIAGNOSIS — Z8679 Personal history of other diseases of the circulatory system: Secondary | ICD-10-CM | POA: Diagnosis not present

## 2023-05-08 DIAGNOSIS — I5023 Acute on chronic systolic (congestive) heart failure: Secondary | ICD-10-CM | POA: Diagnosis not present

## 2023-05-08 LAB — RENAL FUNCTION PANEL
Albumin: 1.8 g/dL — ABNORMAL LOW (ref 3.5–5.0)
Anion gap: 16 — ABNORMAL HIGH (ref 5–15)
BUN: 37 mg/dL — ABNORMAL HIGH (ref 8–23)
CO2: 27 mmol/L (ref 22–32)
Calcium: 8.3 mg/dL — ABNORMAL LOW (ref 8.9–10.3)
Chloride: 93 mmol/L — ABNORMAL LOW (ref 98–111)
Creatinine, Ser: 6.32 mg/dL — ABNORMAL HIGH (ref 0.61–1.24)
GFR, Estimated: 9 mL/min — ABNORMAL LOW (ref >60)
Glucose, Bld: 72 mg/dL (ref 70–99)
Phosphorus: 3.8 mg/dL (ref 2.5–4.6)
Potassium: 3.9 mmol/L (ref 3.5–5.1)
Sodium: 136 mmol/L (ref 135–145)

## 2023-05-08 LAB — MRSA NEXT GEN BY PCR, NASAL: MRSA by PCR Next Gen: NOT DETECTED

## 2023-05-08 NOTE — Discharge Summary (Signed)
 Physician Discharge Summary   Patient: Jacob Daniel MRN: [id number] DOB: [birth year]  Admit date:     05/04/2023  Discharge date: 05/08/23  Discharge Physician: York Ram Gasper Hopes   PCP: Pcp, No   Recommendations at discharge:    Patient had ultrafiltration and paracentesis with improvement in volume status, he will continue renal replacement therapy with ultrafiltration as outpatient, MWF.  Follow up with primary care in 7 to 10 days.   Discharge Diagnoses: Principal Problem:   ESRD (end stage renal disease) on dialysis [facility]) Active Problems:   Acute on chronic systolic CHF (congestive heart failure) (HCC)   History of CAD (coronary artery disease)   Essential hypertension   Hyperlipidemia   Paroxysmal atrial fibrillation (HCC)  Resolved Problems:   T wave inversion in EKG  Hospital Course: Jacob Daniel was admitted to the hospital with the working diagnosis of volume overload in the setting of ESRD.   68 yo male with the past medical history of ESRD on HD, MWF, failed renal transplant, hypertension, hyperlipidemia, atrial fibrillation, heart failure, and pulmonary hypertension who presented with dyspnea and abdominal distention.  Apparently he missed one HD session the week prior to hospitalization.  On his initial physical examination his blood pressure was 163/105, HR 79, RR 32 and 02 saturation 90%.  Lungs with bilateral rales with no wheezing, heart with S1 and S2 present and regular with no gallops, abdomen distended and dull to percussion, non tender, positive lower extremity edema.   Na 138, K 4,1 CL 97 bicarbonate 23, glucose 92, bun 52, cr 8,67  AST 16 ALT 15  BNP >4,500  Wbc 3,4 hgb 10.1 plt 136   Sars covid 19 negative Influenza negative   CT abdomen and pelvis with moderate to large volume abdominopelvic ascites.  Questionable nodularity of the liver.  Polycystic appearance of the kidneys, right lower quadrant renal transplant with appears atrophic.   Small bilateral pleural effusions.   Chest radiograph with bilateral hilar vascular congestion, with bilateral cephalization of the vasculature, bilateral central interstitial infiltrates.   EKG 73 bpm, normal axis, qtc 511, sinus rhythm with no significant ST segment or T wave changes.   02/19 patient had HD with ultrafiltration.  02/20 paracentesis US guided with 4,7 L removed.  02/21 HD with ultrafiltration.  02/22 patient with euvolemic state, continue with HD schedule MWF as outpatient.   Assessment and Plan: * ESRD (end stage renal disease) on dialysis (HCC) Improvement in volume status back to baseline.   Today renal function with BUN down to 37 with K at 3,9 and serum bicarbonate at 27  Na 136 and P 3,8   Patient had aggressive ultrafiltration through HD with improvement in his volume status.  SP paracentesis obtaining 4.7 L of fluid.   Failed renal transplant, continue with tacrolimus.   Anemia of chronic renal disease, with iron deficiency, continue iron supplementation.   Metabolic bone disease. Continue with sensipar, calcium acetate and calcitriol.  Oral sodium bicarbonate.   Acute on chronic systolic CHF (congestive heart failure) (HCC) Echocardiogram with reduced LV systolic function with EF 30 to 35%, global hypokinesis, mild LVH, RV with moderate reduction in systolic function, RVSP 44.5 mmHg, LA with mild dilatation, no significant valvular disease.   Biventricular failure.  Acute on chronic core pulmonale.  RV failure.   Continue fluid removal with ultrafiltration through HD.  Continue with irbersartan with close follow up on serum K.   History of CAD (coronary artery disease) No acute coronary syndrome.  Essential hypertension Blood pressure has improved with systolic in the 140 mmHg range.  Plan to continue metoprolol and irbesartan.  Further ultrafiltration per HD.   Hyperlipidemia Continue with statin.   Paroxysmal atrial fibrillation  (HCC) Continue rate control with metoprolol and anticoagulation with apixaban.         Consultants: nephrology  Procedures performed: none   Disposition: Home Diet recommendation:  Cardiac diet DISCHARGE MEDICATION: Allergies as of 05/08/2023   No Known Allergies      Medication List     STOP taking these medications    aspirin EC 81 MG tablet   indomethacin 25 MG capsule Commonly known as: INDOCIN       TAKE these medications    acetaminophen-codeine 300-15 MG tablet Commonly known as: TYLENOL #2 Take 1 tablet by mouth every 8 (eight) hours as needed for moderate pain (pain score 4-6).   albuterol 0.63 MG/3ML nebulizer solution Commonly known as: ACCUNEB Take 1 ampule by nebulization every 6 (six) hours as needed for wheezing.   albuterol 108 (90 Base) MCG/ACT inhaler Commonly known as: VENTOLIN HFA Inhale 2 puffs into the lungs every 6 (six) hours as needed for wheezing.   atorvastatin 10 MG tablet Commonly known as: LIPITOR Take 1 tablet (10 mg total) by mouth daily.   calcium acetate 667 MG capsule Commonly known as: PHOSLO Take 3 capsules (2,001 mg total) by mouth 3 (three) times daily with meals.   Eliquis 5 MG Tabs tablet Generic drug: apixaban Take 1 tablet (5 mg total) by mouth 2 (two) times daily.   FeroSul 325 (65 Fe) MG tablet Generic drug: ferrous sulfate Take 1 tablet (325 mg total) by mouth daily with breakfast.   ipratropium-albuterol 0.5-2.5 (3) MG/3ML Soln Commonly known as: DUONEB Take 3 mLs by nebulization every 6 (six) hours as needed (for shortness of breath and wheezing).   irbesartan 300 MG tablet Commonly known as: AVAPRO Take 1 tablet (300 mg total) by mouth daily.   magnesium oxide 400 MG tablet Commonly known as: MAG-OX Take 1 tablet (400 mg total) by mouth daily. What changed: additional instructions   metoprolol succinate 25 MG 24 hr tablet Commonly known as: TOPROL-XL Take 3 tablets (75 mg total) by mouth  daily.   sildenafil 20 MG tablet Commonly known as: REVATIO Take 20-60 mg by mouth daily as needed (erectile dysfunction).   sodium bicarbonate 650 MG tablet Take 1 tablet (650 mg total) by mouth 2 (two) times daily.   tacrolimus 0.5 MG capsule Commonly known as: PROGRAF Take 2 capsules (1 mg total) by mouth daily.        Discharge Exam: Filed Weights   05/05/23 1820 05/07/23 0827 05/07/23 1311  Weight: 86.4 kg 84.7 kg 82.2 kg   BP 136/86 (BP Location: Right Arm)   Pulse 61   Temp 98.3 F (36.8 C) (Oral)   Resp 20   Ht 6' (1.829 m)   Wt 82.2 kg   SpO2 96%   BMI 24.58 kg/m   Patient is feeling very well this morning, no chest pain, no dyspnea, no PND, orthopnea or lower extremity edema. No abdominal distention   Neurology awake and alert ENT with mild pallor with no icterus Cardiovascular with S1 and S2 present and regular with no gallops, rubs or murmurs Respiratory with no rales or wheezing, no rhonchi Abdomen with no distention   Condition at discharge: stable  The results of significant diagnostics from this hospitalization (including imaging, microbiology, ancillary and laboratory) are  listed below for reference.   Imaging Studies: IR Paracentesis Result Date: 05/06/2023 INDICATION: 68 year old male with end stage renal disease an volume overload for diagnostic and therapeutic thoracentesis. EXAM: ULTRASOUND GUIDED RIGHT PARACENTESIS MEDICATIONS: 10 mL 1% lidocaine COMPLICATIONS: None immediate. PROCEDURE: Informed written consent was obtained from the patient after a discussion of the risks, benefits and alternatives to treatment. A timeout was performed prior to the initiation of the procedure. Initial ultrasound scanning demonstrates a large amount of ascites within the right lower abdominal quadrant. The right lower abdomen was prepped and draped in the usual sterile fashion. 1% lidocaine was used for local anesthesia. Following this, a 19 gauge, 7-cm, Yueh  catheter was introduced. An ultrasound image was saved for documentation purposes. The paracentesis was performed. The catheter was removed and a dressing was applied. The patient tolerated the procedure well without immediate post procedural complication. FINDINGS: A total of approximately 4.7 L of clear yellow fluid was removed. Samples were sent to the laboratory as requested by the clinical team. IMPRESSION: Successful ultrasound-guided paracentesis yielding 4.7 liters of peritoneal fluid. PLAN: Pt with end stage renal disease and ascites. Performed By Theresa Mulligan, PA-C Electronically Signed   By: Marliss Coots M.D.   On: 05/06/2023 12:10   US Abdomen Limited Result Date: 05/05/2023 CLINICAL DATA:  [id number] Ascites [id number] EXAM: LIMITED ABDOMEN ULTRASOUND FOR ASCITES TECHNIQUE: Limited ultrasound survey for ascites was performed in all four abdominal quadrants. COMPARISON:  May 04, 2023 FINDINGS: Targeted ultrasound was performed of the 4 quadrants. There is a large volume of ascites. Bilateral pleural effusions are noted. IMPRESSION: Large volume of ascites. Electronically Signed   By: Meda Klinefelter M.D.   On: 05/05/2023 13:25   CT ABDOMEN PELVIS WO CONTRAST Result Date: 05/04/2023 CLINICAL DATA:  Acute abdominal pain. EXAM: CT ABDOMEN AND PELVIS WITHOUT CONTRAST TECHNIQUE: Multidetector CT imaging of the abdomen and pelvis was performed following the standard protocol without IV contrast. RADIATION DOSE REDUCTION: This exam was performed according to the departmental dose-optimization program which includes automated exposure control, adjustment of the mA and/or kV according to patient size and/or use of iterative reconstruction technique. COMPARISON:  CT 02/10/2022, images available but no report FINDINGS: Lower chest: The heart is enlarged. There are small bilateral pleural effusions. Septal thickening and ground-glass opacity typical of pulmonary edema. Hepatobiliary: No evidence of focal  liver abnormality on this unenhanced exam. Potential but not definite capsular nodularity. Gallstones and probable sludge in the gallbladder. Ascites and motion artifact limit gallbladder assessment. The common bile duct is poorly defined. Pancreas: No ductal dilatation. Specific peripancreatic inflammation. Spleen: Normal in size without focal abnormality. Adrenals/Urinary Tract: No adrenal nodule. Polycystic appearance of the kidneys with replacement of normal parenchyma by innumerable cysts. No hydronephrosis. Renal vascular calcifications, there may be additional small nonobstructing renal calculi. Right lower quadrant renal transplant which appears atrophic. No transplant hydronephrosis. Decompressed urinary bladder. Stomach/Bowel: Mild gaseous gastric distension. No small bowel distension or evidence of obstruction. Colonic diverticulosis without evidence of diverticulitis. Detailed bowel assessment is limited due to motion and presence of ascites. Normal appendix is visualized. Vascular/Lymphatic: Aortic and branch atherosclerosis. No aortic aneurysm. No bulky abdominopelvic adenopathy on this limited exam. Reproductive: Mildly prominent prostate. Other: Moderate to large volume abdominopelvic ascites. Generalized edema of the subcutaneous and intra-abdominal fat. No free air. Musculoskeletal: L4-L5 and L5-S1 degenerative disc disease. There are no acute or suspicious osseous abnormalities. IMPRESSION: 1. Moderate to large volume abdominopelvic ascites. 2. Questionable but not definite capsular  nodularity of the liver, recommend correlation for cirrhosis risk factors. 3. Polycystic appearance of the kidneys. Right lower quadrant renal transplant which appears atrophic. 4. Small bilateral pleural effusions. Features of pulmonary edema in the lung bases. Electronically Signed   By: Narda Rutherford M.D.   On: 05/04/2023 21:25   DG Chest 1 View Result Date: 05/04/2023 CLINICAL DATA:  Shortness of breath.   History of CHF. EXAM: CHEST  1 VIEW COMPARISON:  Chest radiograph dated 04/09/2023. FINDINGS: Stable cardiomediastinal silhouette. Aortic atherosclerosis. Bilateral perihilar interstitial prominence. No focal consolidation, sizeable pleural effusion, or pneumothorax. No acute osseous abnormality. IMPRESSION: Bilateral perihilar interstitial prominence suggestive of interstitial pulmonary edema. No focal consolidation. Electronically Signed   By: Hart Robinsons M.D.   On: 05/04/2023 17:57   NM Pulmonary Perfusion Result Date: 04/10/2023 CLINICAL DATA:  Pulmonary hypertension suspected EXAM: NUCLEAR MEDICINE PERFUSION LUNG SCAN TECHNIQUE: Perfusion images were obtained in multiple projections after intravenous injection of radiopharmaceutical. Ventilation scans intentionally deferred if perfusion scan and chest x-ray adequate for interpretation during COVID 19 epidemic. RADIOPHARMACEUTICALS:  3.9 mCi Tc-41m MAA IV COMPARISON:  Chest radiographs, 04/09/2023 FINDINGS: Mildly heterogeneous perfusion of the lungs without suspicious perfusion abnormality. IMPRESSION: Very low probability for pulmonary embolism by modified perfusion only PIOPED criteria (PE absent). Electronically Signed   By: Jearld Lesch M.D.   On: 04/10/2023 16:28   DG CHEST PORT 1 VIEW Result Date: 04/09/2023 CLINICAL DATA:  Shortness of breath. EXAM: PORTABLE CHEST 1 VIEW COMPARISON:  04/06/2023 FINDINGS: Stable heart size and mediastinal contours. Aortic atherosclerosis. Improving interstitial thickening from prior exam. No acute airspace disease. No pneumothorax or pleural effusion. Stable osseous structures. IMPRESSION: Improving interstitial thickening from prior exam. No acute findings. Electronically Signed   By: Narda Rutherford M.D.   On: 04/09/2023 19:00   CARDIAC CATHETERIZATION Result Date: 04/08/2023 Images from the original result were not included. Right & Left Heart Catheterization 04/08/23: Hemodynamic data: RA 32/36, mean  29 mmHg RV 67/10, EDP 25 mmHg PA 72/40, mean 46 mmHg.  PA saturation 63%. PW 45/49, mean 42 mmHg. QP/QS 1.0.  CO 5.37, CI 2.54, normal. PVR 0.75, PAPi 1.1, severely reduced suggestive of RV dysfunction. LVEDP 28 mmHg.  No pressure gradient across the aortic valve. Angiographic data: LM: Large-caliber vessel, calcified but normal. LAD: Large-caliber vessel, severely calcified with moderate to diffuse atherosclerosis, apical LAD has calcific 90% stenosis.  D1 is small to moderate-sized again with diffuse disease. LCx: Large-caliber vessel giving origin to large OM1 and a small OM 2, again diffuse calcification is evident. RCA: Very large caliber vessel, there is a proximal 30% and a mid 30% stenosis again severe diffuse calcification is evident. Impression and recommendations: Right heart catheterization suggestive of severe pulmonary hypertension with severely elevated LVEDP, preserved cardiac output and index, markedly reduced PAPi suggests severe RV dysfunction. Severe diffuse coronary calcification.  No proximal vessel significant disease. Findings consistent with nonischemic cardiomyopathy.    Microbiology: Results for orders placed or performed during the hospital encounter of 05/04/23  Resp panel by RT-PCR (RSV, Flu A&B, Covid) Anterior Nasal Swab     Status: None   Collection Time: 05/04/23  3:45 PM   Specimen: Anterior Nasal Swab  Result Value Ref Range Status   SARS Coronavirus 2 by RT PCR NEGATIVE NEGATIVE Final   Influenza A by PCR NEGATIVE NEGATIVE Final   Influenza B by PCR NEGATIVE NEGATIVE Final    Comment: (NOTE) The Xpert Xpress SARS-CoV-2/FLU/RSV plus assay is intended as an aid in  the diagnosis of influenza from Nasopharyngeal swab specimens and should not be used as a sole basis for treatment. Nasal washings and aspirates are unacceptable for Xpert Xpress SARS-CoV-2/FLU/RSV testing.  Fact Sheet for Patients: BloggerCourse.com  Fact Sheet for Healthcare  Providers: SeriousBroker.it  This test is not yet approved or cleared by the [place] FDA and has been authorized for detection and/or diagnosis of SARS-CoV-2 by FDA under an Emergency Use Authorization (EUA). This EUA will remain in effect (meaning this test can be used) for the duration of the COVID-19 declaration under Section 564(b)(1) of the Act, 21 U.S.C. section 360bbb-3(b)(1), unless the authorization is terminated or revoked.     Resp Syncytial Virus by PCR NEGATIVE NEGATIVE Final    Comment: (NOTE) Fact Sheet for Patients: BloggerCourse.com  Fact Sheet for Healthcare Providers: SeriousBroker.it  This test is not yet approved or cleared by the [place] FDA and has been authorized for detection and/or diagnosis of SARS-CoV-2 by FDA under an Emergency Use Authorization (EUA). This EUA will remain in effect (meaning this test can be used) for the duration of the COVID-19 declaration under Section 564(b)(1) of the Act, 21 U.S.C. section 360bbb-3(b)(1), unless the authorization is terminated or revoked.  Performed at [facility], [address] [place], [place] [address]   MRSA Next Gen by PCR, Nasal     Status: None   Collection Time: 05/08/23  5:20 AM   Specimen: Nasal Mucosa; Nasal Swab  Result Value Ref Range Status   MRSA by PCR Next Gen NOT DETECTED NOT DETECTED Final    Comment: (NOTE) The GeneXpert MRSA Assay (FDA approved for NASAL specimens only), is one component of a comprehensive MRSA colonization surveillance program. It is not intended to diagnose MRSA infection nor to guide or monitor treatment for MRSA infections. Test performance is not FDA approved in patients less than 79 years old. Performed at [facility], [address] [place], [place] [address]     Labs: CBC: Recent Labs  Lab 05/04/23 1556 05/05/23 0120 05/07/23 0744  WBC 3.4* 3.2* 5.5   HGB 10.1* 8.5* 10.7*  HCT 30.9* 25.8* 32.5*  MCV 89.8 88.7 88.8  PLT 136* 137* 202   Basic Metabolic Panel: Recent Labs  Lab 05/04/23 1556 05/05/23 0120 05/06/23 0329 05/07/23 0744 05/08/23 0331  NA 138 136 137 133* 136  K 4.1 4.1 4.1 4.6 3.9  CL 97* 99 97* 92* 93*  CO2 23 23 27  21* 27  GLUCOSE 92 92 86 64* 72  BUN 52* 53* 39* 57* 37*  CREATININE 8.67* 8.88* 7.03* 8.45* 6.32*  CALCIUM 8.1* 7.6* 8.3* 8.1* 8.3*  PHOS  --   --  4.7* 5.3* 3.8   Liver Function Tests: Recent Labs  Lab 05/04/23 1556 05/05/23 0120 05/06/23 0329 05/07/23 0744 05/08/23 0331  AST 16 12*  --   --   --   ALT 15 12  --   --   --   ALKPHOS 86 71  --   --   --   BILITOT 1.8* 1.7*  --   --   --   PROT 7.7 7.1  --   --   --   ALBUMIN 2.5* 2.2* 2.3* 2.3* 1.8*   CBG: No results for input(s): "GLUCAP" in the last 168 hours.  Discharge time spent: greater than 30 minutes.  Signed: Coralie Keens, MD [facility] 05/08/2023

## 2023-05-08 NOTE — Progress Notes (Signed)
 [facility] Progress Note   Subjective:    Seen and examined patient at bedside. Tolerated yesterday's HD with net UF 2.5L. He reports feeling much better, especially after the paracentesis this week. Plan for discharge today.  Objective Vitals:   05/07/23 1311 05/07/23 1955 05/08/23 0335 05/08/23 1057  BP: (!) 152/89 132/82 136/86 (!) 141/91  Pulse: (!) 57 (!) 55 61 (!) 54  Resp: (!) 25 20 20 16   Temp:  97.6 F (36.4 C) 98.3 F (36.8 C) 97.8 F (36.6 C)  TempSrc:  Oral Oral Oral  SpO2: 100% 93% 96% 100%  Weight: 82.2 kg     Height:       Physical Exam General: Chronically ill appearing male in NAD Heart: S1,S2 RRR No M/R/G Lungs: CTAB slightly decreased in bases. No WOB Abdomen: distended probable ascites present. NABS Extremities: No LE edema Dialysis Access: L AVF aneurysmal + T/B  Filed Weights   05/05/23 1820 05/07/23 0827 05/07/23 1311  Weight: 86.4 kg 84.7 kg 82.2 kg    Intake/Output Summary (Last 24 hours) at 05/08/2023 1339 Last data filed at 05/07/2023 1448 Gross per 24 hour  Intake 118 ml  Output --  Net 118 ml    Additional Objective Labs: Basic Metabolic Panel: Recent Labs  Lab 05/06/23 0329 05/07/23 0744 05/08/23 0331  NA 137 133* 136  K 4.1 4.6 3.9  CL 97* 92* 93*  CO2 27 21* 27  GLUCOSE 86 64* 72  BUN 39* 57* 37*  CREATININE 7.03* 8.45* 6.32*  CALCIUM 8.3* 8.1* 8.3*  PHOS 4.7* 5.3* 3.8   Liver Function Tests: Recent Labs  Lab 05/04/23 1556 05/05/23 0120 05/06/23 0329 05/07/23 0744 05/08/23 0331  AST 16 12*  --   --   --   ALT 15 12  --   --   --   ALKPHOS 86 71  --   --   --   BILITOT 1.8* 1.7*  --   --   --   PROT 7.7 7.1  --   --   --   ALBUMIN 2.5* 2.2* 2.3* 2.3* 1.8*   No results for input(s): "LIPASE", "AMYLASE" in the last 168 hours. CBC: Recent Labs  Lab 05/04/23 1556 05/05/23 0120 05/07/23 0744  WBC 3.4* 3.2* 5.5  HGB 10.1* 8.5* 10.7*  HCT 30.9* 25.8* 32.5*  MCV 89.8 88.7 88.8  PLT 136* 137*  202   Blood Culture No results found for: "SDES", "SPECREQUEST", "CULT", "REPTSTATUS"  Cardiac Enzymes: No results for input(s): "CKTOTAL", "CKMB", "CKMBINDEX", "TROPONINI" in the last 168 hours. CBG: No results for input(s): "GLUCAP" in the last 168 hours. Iron Studies: No results for input(s): "IRON", "TIBC", "TRANSFERRIN", "FERRITIN" in the last 72 hours. Lab Results  Component Value Date   INR 1.4 (H) 05/05/2023   Studies/Results: No results found.  Medications:   apixaban  5 mg Oral BID   aspirin EC  81 mg Oral Daily   atorvastatin  10 mg Oral Daily   calcitRIOL  1.75 mcg Oral Q M,W,F-HD   calcium acetate  2,001 mg Oral TID WC   cinacalcet  30 mg Oral Q M,W,F-HD   docusate sodium  100 mg Oral BID   ferrous sulfate  325 mg Oral Q breakfast   irbesartan  300 mg Oral Daily   metoprolol succinate  75 mg Oral Daily   sodium bicarbonate  650 mg Oral BID   sodium chloride flush  3 mL Intravenous Q12H   sodium chloride flush  3 mL Intravenous Q12H   tacrolimus  1 mg Oral Daily    Dialysis Orders: [facility] MWF 4 hrs 400/600 90 kg 2.0 K/2.5 Ca  AVF  - Heparin 1000 units initial bolus then 600 units Hr/stop 1 hour before end of treatment (total 2800 units IV ) - Calcitriol 1.75 mcg PO three times per week  - Cinacalcet 30 mg PO three times per week  - Venofer 50 mg IV weekly - Micera 60 mcg IV q 2 weeks (last dose 04/26/2023 next dose due 05/10/2023)  Assessment/Plan: Volume overload - w/ bilat LE edema and early IS edema on CXR. HD 02/19 Net UF 3.5 liters. Continue to optimize volume with HD, UF as tolerated.  HFrEF - last EF 35%. Continue to optimize volume with HD, UF as tolerated.  ESRD - on HD MWF. Next HD 2/24 in outpatient.  HTN - BP better controlled after volume removal. Resume home meds.  Volume - Improved post HD but has ascites. S/p paracentesis by IR 2/20-4.7L removed.  Anemia of esrd - HGB 8.5 this AM. ESA not due yet. Follow HGB. Transfuse if less  than 7.0.   Secondary hyperparathyroidism - CCa in range. PO4 at goal..  PAF. Continue binders, sensipar, VDRA Hx of failed renal tranplant - remains on low dose 1 mg prograf per day.  Chronic hypoxic resp faliure - uses 1-2 L O2 [place] at bases. Now on RA Dispo - Okay for discharge from a renal standpoint. Patient to resume HD on 2/24 in outpatient.  Salome Holmes, NP [facility] 05/08/2023,1:39 PM  LOS: 4 days

## 2023-05-08 NOTE — Plan of Care (Signed)
  Problem: Clinical Measurements: Goal: Ability to maintain clinical measurements within normal limits will improve Outcome: Progressing Goal: Will remain free from infection Outcome: Progressing Goal: Respiratory complications will improve Outcome: Progressing Goal: Cardiovascular complication will be avoided Outcome: Progressing   Problem: Fluid Volume: Goal: Compliance with measures to maintain balanced fluid volume will improve Outcome: Progressing

## 2023-05-10 NOTE — Progress Notes (Signed)
 Late Note Entry- May 10, 2023  Pt was d/c on Saturday. Contacted [facility] this morning who reports pt came to treatment this morning and clinic able to obtain needed clinicals.   Olivia Canter Renal Navigator [phone]

## 2023-05-25 ENCOUNTER — Ambulatory Visit (INDEPENDENT_AMBULATORY_CARE_PROVIDER_SITE_OTHER): Payer: 59 | Admitting: Podiatry

## 2023-05-25 DIAGNOSIS — Z91199 Patient's noncompliance with other medical treatment and regimen due to unspecified reason: Secondary | ICD-10-CM

## 2023-05-27 NOTE — Progress Notes (Signed)
1. No-show for appointment    Patient recently hospitalized.

## 2023-06-01 ENCOUNTER — Ambulatory Visit: Payer: 59 | Admitting: Cardiology

## 2023-06-07 ENCOUNTER — Other Ambulatory Visit: Payer: Self-pay | Admitting: Student

## 2023-06-07 DIAGNOSIS — R188 Other ascites: Secondary | ICD-10-CM

## 2023-07-29 ENCOUNTER — Ambulatory Visit: Attending: Cardiology | Admitting: Cardiology

## 2023-09-28 ENCOUNTER — Encounter (HOSPITAL_BASED_OUTPATIENT_CLINIC_OR_DEPARTMENT_OTHER): Payer: Self-pay | Admitting: *Deleted

## 2023-09-28 ENCOUNTER — Other Ambulatory Visit: Payer: Self-pay

## 2023-09-28 ENCOUNTER — Emergency Department (HOSPITAL_BASED_OUTPATIENT_CLINIC_OR_DEPARTMENT_OTHER)
Admission: EM | Admit: 2023-09-28 | Discharge: 2023-09-28 | Disposition: A | Discharge status: Home / Self Care | Source: Ambulatory Visit | Attending: Emergency Medicine | Admitting: Emergency Medicine

## 2023-09-28 DIAGNOSIS — E119 Type 2 diabetes mellitus without complications: Secondary | ICD-10-CM | POA: Insufficient documentation

## 2023-09-28 DIAGNOSIS — Z992 Dependence on renal dialysis: Secondary | ICD-10-CM | POA: Insufficient documentation

## 2023-09-28 DIAGNOSIS — W57XXXA Bitten or stung by nonvenomous insect and other nonvenomous arthropods, initial encounter: Secondary | ICD-10-CM | POA: Insufficient documentation

## 2023-09-28 DIAGNOSIS — L089 Local infection of the skin and subcutaneous tissue, unspecified: Secondary | ICD-10-CM | POA: Insufficient documentation

## 2023-09-28 DIAGNOSIS — Z7901 Long term (current) use of anticoagulants: Secondary | ICD-10-CM | POA: Insufficient documentation

## 2023-09-28 DIAGNOSIS — S80861A Insect bite (nonvenomous), right lower leg, initial encounter: Secondary | ICD-10-CM | POA: Diagnosis present

## 2023-09-28 MED ORDER — DOXYCYCLINE HYCLATE 100 MG PO CAPS: 100.0000 mg | ORAL_CAPSULE | Freq: Two times a day (BID) | ORAL | 0 refills | Status: DC

## 2023-09-28 NOTE — ED Provider Notes (Signed)
 [place] EMERGENCY DEPARTMENT AT [facility] Provider Note   CSN: [id number] Arrival date & time: 09/28/23  1214     Patient presents with: Leg Pain   Jacob Daniel is a 68 y.o. male.   Patient complains of a dark area to his left lower leg.  Patient was seen at urgent care and sent here for evaluation.  Urgent care provider is concerned that patient could have a bite from a brown recluse because the skin is dark.  Patient denies any redness or swelling he denies any fever or chills.  Patient states he does not recall anything biting him.  Patient states that there are a lot of bugs in the place that he lives.  Patient has a past history of diabetes.  Patient is currently on dialysis.  The history is provided by the patient. No language interpreter was used.  Leg Pain      Prior to Admission medications   Medication Sig Start Date End Date Taking? Authorizing Provider  doxycycline  (VIBRAMYCIN ) 100 MG capsule Take 1 capsule (100 mg total) by mouth 2 (two) times daily. 09/28/23  Yes Aby Gessel K, PA-C  acetaminophen -codeine (TYLENOL  #2) 300-15 MG tablet Take 1 tablet by mouth every 8 (eight) hours as needed for moderate pain (pain score 4-6). 02/25/23   [provider]  albuterol  (ACCUNEB ) 0.63 MG/3ML nebulizer solution Take 1 ampule by nebulization every 6 (six) hours as needed for wheezing.    [provider]  albuterol  (VENTOLIN  HFA) 108 (90 Base) MCG/ACT inhaler Inhale 2 puffs into the lungs every 6 (six) hours as needed for wheezing.    [provider]  apixaban  (ELIQUIS ) 5 MG TABS tablet Take 1 tablet (5 mg total) by mouth 2 (two) times daily. 04/10/23   Jillian Buttery, MD  atorvastatin  (LIPITOR) 10 MG tablet Take 1 tablet (10 mg total) by mouth daily. 04/10/23 01/13/27  Jillian Buttery, MD  calcium  acetate (PHOSLO ) 667 MG capsule Take 3 capsules (2,001 mg total) by mouth 3 (three) times daily with meals. 04/10/23   Jillian Buttery, MD   ferrous sulfate  325 (65 FE) MG tablet Take 1 tablet (325 mg total) by mouth daily with breakfast. 04/11/23   Jillian Buttery, MD  ipratropium-albuterol  (DUONEB) 0.5-2.5 (3) MG/3ML SOLN Take 3 mLs by nebulization every 6 (six) hours as needed (for shortness of breath and wheezing). 02/16/23   [provider]  irbesartan  (AVAPRO ) 300 MG tablet Take 1 tablet (300 mg total) by mouth daily. 04/11/23   Jillian Buttery, MD  magnesium  oxide (MAG-OX) 400 MG tablet Take 1 tablet (400 mg total) by mouth daily. Patient taking differently: Take 400 mg by mouth daily. On Mon, Wed, and Fri 04/10/23   Jillian Buttery, MD  metoprolol  succinate (TOPROL -XL) 25 MG 24 hr tablet Take 3 tablets (75 mg total) by mouth daily. 04/11/23   Adhikari, Amrit, MD  sildenafil (REVATIO) 20 MG tablet Take 20-60 mg by mouth daily as needed (erectile dysfunction). 06/03/20   [provider]  sodium bicarbonate  650 MG tablet Take 1 tablet (650 mg total) by mouth 2 (two) times daily. 04/10/23   Jillian Buttery, MD  tacrolimus  (PROGRAF ) 0.5 MG capsule Take 2 capsules (1 mg total) by mouth daily. 04/10/23   Jillian Buttery, MD    Allergies: Patient has no known allergies.    Review of Systems  All other systems reviewed and are negative.   Updated Vital Signs BP (!) 150/90 (BP Location: Left Arm)   Pulse 89  Temp 98.1 F (36.7 C) (Oral)   Resp 16   SpO2 100%   Physical Exam Vitals and nursing note reviewed.  Constitutional:      Appearance: He is well-developed.  HENT:     Head: Normocephalic.  Cardiovascular:     Rate and Rhythm: Normal rate.  Pulmonary:     Effort: Pulmonary effort is normal.  Abdominal:     General: There is no distension.  Musculoskeletal:        General: Normal range of motion.     Comments: 2 cm dark area left lower leg, looks more like contusion than necrosis.  Full range of motion neurovascular neurosensory intact.  Skin:    General: Skin is warm.  Neurological:     General:  No focal deficit present.     Mental Status: He is alert and oriented to person, place, and time.     (all labs ordered are listed, but only abnormal results are displayed) Labs Reviewed - No data to display  EKG: None  Radiology: No results found.   Procedures   Medications Ordered in the ED - No data to display                                  Medical Decision Making Patient sent here from urgent care for evaluation of the possible brown recluse bite.  Patient has a dark area on his left lower leg.  Patient states there are a lot of insects at his home.  Risk Prescription drug management. Risk Details: Patient has a 2 cm dark area at this looks more like bruising, I will treat patient with doxycycline .  Patient is counseled on the importance of follow-up.  He is advised to recheck at urgent care in 2 days.  Should return to the emergency department if fever chills increased redness and swelling.        Final diagnoses:  Skin infection  Insect bite of right lower leg, initial encounter    ED Discharge Orders          Ordered    doxycycline  (VIBRAMYCIN ) 100 MG capsule  2 times daily        09/28/23 1617            An After Visit Summary was printed and given to the patient.    Flint Sonny POUR, PA-C 09/28/23 1708    Jerrol Agent, MD 09/28/23 1729

## 2023-09-28 NOTE — Discharge Instructions (Addendum)
 Return if any problems.  See your Physician or recheck at urgent care in 2 days

## 2023-09-28 NOTE — ED Triage Notes (Signed)
 Pt to ED from outpatient clinic for further workup to rule out a brown reclus spider bite to patient lower left leg. Blackened area on patient calf with severe pain x 5 days. Patient denies seeing a spider at anytime but does not know what caused the area of blackened skin.

## 2023-10-25 ENCOUNTER — Inpatient Hospital Stay (HOSPITAL_COMMUNITY)
Admission: EM | Admit: 2023-10-25 | Discharge: 2023-11-13 | DRG: 239 | Disposition: A | Discharge status: Skilled Nursing Facility | Attending: Internal Medicine | Admitting: Internal Medicine

## 2023-10-25 ENCOUNTER — Emergency Department (HOSPITAL_COMMUNITY)

## 2023-10-25 ENCOUNTER — Encounter (HOSPITAL_COMMUNITY): Payer: Self-pay

## 2023-10-25 ENCOUNTER — Other Ambulatory Visit: Payer: Self-pay

## 2023-10-25 DIAGNOSIS — M62262 Nontraumatic ischemic infarction of muscle, left lower leg: Secondary | ICD-10-CM | POA: Diagnosis not present

## 2023-10-25 DIAGNOSIS — E11649 Type 2 diabetes mellitus with hypoglycemia without coma: Secondary | ICD-10-CM | POA: Diagnosis present

## 2023-10-25 DIAGNOSIS — I132 Hypertensive heart and chronic kidney disease with heart failure and with stage 5 chronic kidney disease, or end stage renal disease: Secondary | ICD-10-CM | POA: Diagnosis present

## 2023-10-25 DIAGNOSIS — Y830 Surgical operation with transplant of whole organ as the cause of abnormal reaction of the patient, or of later complication, without mention of misadventure at the time of the procedure: Secondary | ICD-10-CM | POA: Diagnosis present

## 2023-10-25 DIAGNOSIS — L97229 Non-pressure chronic ulcer of left calf with unspecified severity: Secondary | ICD-10-CM | POA: Diagnosis present

## 2023-10-25 DIAGNOSIS — E872 Acidosis, unspecified: Secondary | ICD-10-CM | POA: Diagnosis present

## 2023-10-25 DIAGNOSIS — I2510 Atherosclerotic heart disease of native coronary artery without angina pectoris: Secondary | ICD-10-CM | POA: Diagnosis present

## 2023-10-25 DIAGNOSIS — E8809 Other disorders of plasma-protein metabolism, not elsewhere classified: Secondary | ICD-10-CM | POA: Diagnosis present

## 2023-10-25 DIAGNOSIS — E785 Hyperlipidemia, unspecified: Secondary | ICD-10-CM | POA: Diagnosis present

## 2023-10-25 DIAGNOSIS — L03116 Cellulitis of left lower limb: Secondary | ICD-10-CM | POA: Diagnosis present

## 2023-10-25 DIAGNOSIS — N2581 Secondary hyperparathyroidism of renal origin: Secondary | ICD-10-CM | POA: Diagnosis present

## 2023-10-25 DIAGNOSIS — I70262 Atherosclerosis of native arteries of extremities with gangrene, left leg: Secondary | ICD-10-CM | POA: Diagnosis present

## 2023-10-25 DIAGNOSIS — Z91148 Patient's other noncompliance with medication regimen for other reason: Secondary | ICD-10-CM

## 2023-10-25 DIAGNOSIS — I998 Other disorder of circulatory system: Principal | ICD-10-CM

## 2023-10-25 DIAGNOSIS — E1122 Type 2 diabetes mellitus with diabetic chronic kidney disease: Secondary | ICD-10-CM | POA: Diagnosis present

## 2023-10-25 DIAGNOSIS — I10 Essential (primary) hypertension: Secondary | ICD-10-CM | POA: Diagnosis present

## 2023-10-25 DIAGNOSIS — D631 Anemia in chronic kidney disease: Secondary | ICD-10-CM | POA: Diagnosis present

## 2023-10-25 DIAGNOSIS — I70202 Unspecified atherosclerosis of native arteries of extremities, left leg: Secondary | ICD-10-CM | POA: Diagnosis not present

## 2023-10-25 DIAGNOSIS — E11621 Type 2 diabetes mellitus with foot ulcer: Secondary | ICD-10-CM | POA: Diagnosis present

## 2023-10-25 DIAGNOSIS — F1190 Opioid use, unspecified, uncomplicated: Secondary | ICD-10-CM | POA: Diagnosis present

## 2023-10-25 DIAGNOSIS — M7989 Other specified soft tissue disorders: Secondary | ICD-10-CM | POA: Diagnosis present

## 2023-10-25 DIAGNOSIS — I480 Paroxysmal atrial fibrillation: Secondary | ICD-10-CM | POA: Diagnosis present

## 2023-10-25 DIAGNOSIS — I5023 Acute on chronic systolic (congestive) heart failure: Secondary | ICD-10-CM | POA: Diagnosis not present

## 2023-10-25 DIAGNOSIS — Z8679 Personal history of other diseases of the circulatory system: Secondary | ICD-10-CM

## 2023-10-25 DIAGNOSIS — Z992 Dependence on renal dialysis: Secondary | ICD-10-CM

## 2023-10-25 DIAGNOSIS — I5022 Chronic systolic (congestive) heart failure: Secondary | ICD-10-CM | POA: Diagnosis present

## 2023-10-25 DIAGNOSIS — M898X9 Other specified disorders of bone, unspecified site: Secondary | ICD-10-CM | POA: Diagnosis present

## 2023-10-25 DIAGNOSIS — G4733 Obstructive sleep apnea (adult) (pediatric): Secondary | ICD-10-CM | POA: Diagnosis present

## 2023-10-25 DIAGNOSIS — L97519 Non-pressure chronic ulcer of other part of right foot with unspecified severity: Secondary | ICD-10-CM | POA: Diagnosis present

## 2023-10-25 DIAGNOSIS — T8612 Kidney transplant failure: Secondary | ICD-10-CM | POA: Diagnosis present

## 2023-10-25 DIAGNOSIS — I272 Pulmonary hypertension, unspecified: Secondary | ICD-10-CM | POA: Diagnosis present

## 2023-10-25 DIAGNOSIS — S80862A Insect bite (nonvenomous), left lower leg, initial encounter: Secondary | ICD-10-CM | POA: Diagnosis not present

## 2023-10-25 DIAGNOSIS — E1152 Type 2 diabetes mellitus with diabetic peripheral angiopathy with gangrene: Principal | ICD-10-CM | POA: Diagnosis present

## 2023-10-25 DIAGNOSIS — I70221 Atherosclerosis of native arteries of extremities with rest pain, right leg: Secondary | ICD-10-CM | POA: Diagnosis present

## 2023-10-25 DIAGNOSIS — Z89511 Acquired absence of right leg below knee: Secondary | ICD-10-CM | POA: Diagnosis not present

## 2023-10-25 DIAGNOSIS — Z751 Person awaiting admission to adequate facility elsewhere: Secondary | ICD-10-CM

## 2023-10-25 DIAGNOSIS — T63301A Toxic effect of unspecified spider venom, accidental (unintentional), initial encounter: Secondary | ICD-10-CM | POA: Diagnosis present

## 2023-10-25 DIAGNOSIS — N186 End stage renal disease: Secondary | ICD-10-CM | POA: Diagnosis present

## 2023-10-25 DIAGNOSIS — I82431 Acute embolism and thrombosis of right popliteal vein: Secondary | ICD-10-CM | POA: Diagnosis present

## 2023-10-25 DIAGNOSIS — Z7901 Long term (current) use of anticoagulants: Secondary | ICD-10-CM

## 2023-10-25 DIAGNOSIS — Z79899 Other long term (current) drug therapy: Secondary | ICD-10-CM | POA: Diagnosis not present

## 2023-10-25 DIAGNOSIS — I70201 Unspecified atherosclerosis of native arteries of extremities, right leg: Secondary | ICD-10-CM | POA: Diagnosis present

## 2023-10-25 DIAGNOSIS — R001 Bradycardia, unspecified: Secondary | ICD-10-CM | POA: Diagnosis present

## 2023-10-25 DIAGNOSIS — R5381 Other malaise: Secondary | ICD-10-CM | POA: Diagnosis not present

## 2023-10-25 DIAGNOSIS — M6281 Muscle weakness (generalized): Secondary | ICD-10-CM | POA: Diagnosis not present

## 2023-10-25 LAB — HEPATITIS B SURFACE ANTIGEN: Hepatitis B Surface Ag: NONREACTIVE

## 2023-10-25 LAB — CBC WITH DIFFERENTIAL/PLATELET
Abs Immature Granulocytes: 0.01 K/uL (ref 0.00–0.07)
Basophils Absolute: 0.1 K/uL (ref 0.0–0.1)
Basophils Relative: 1 %
Eosinophils Absolute: 0.3 K/uL (ref 0.0–0.5)
Eosinophils Relative: 5 %
HCT: 33.3 % — ABNORMAL LOW (ref 39.0–52.0)
Hemoglobin: 10.9 g/dL — ABNORMAL LOW (ref 13.0–17.0)
Immature Granulocytes: 0 %
Lymphocytes Relative: 13 %
Lymphs Abs: 0.7 K/uL (ref 0.7–4.0)
MCH: 28.5 pg (ref 26.0–34.0)
MCHC: 32.7 g/dL (ref 30.0–36.0)
MCV: 86.9 fL (ref 80.0–100.0)
Monocytes Absolute: 0.5 K/uL (ref 0.1–1.0)
Monocytes Relative: 10 %
Neutro Abs: 3.5 K/uL (ref 1.7–7.7)
Neutrophils Relative %: 71 %
Platelets: 145 K/uL — ABNORMAL LOW (ref 150–400)
RBC: 3.83 MIL/uL — ABNORMAL LOW (ref 4.22–5.81)
RDW: 16.8 % — ABNORMAL HIGH (ref 11.5–15.5)
WBC: 5 K/uL (ref 4.0–10.5)
nRBC: 0 % (ref 0.0–0.2)

## 2023-10-25 LAB — I-STAT CG4 LACTIC ACID, ED: Lactic Acid, Venous: 3.8 mmol/L (ref 0.5–1.9)

## 2023-10-25 LAB — HEPATIC FUNCTION PANEL
ALT: 16 U/L (ref 0–44)
AST: 22 U/L (ref 15–41)
Albumin: 2.8 g/dL — ABNORMAL LOW (ref 3.5–5.0)
Alkaline Phosphatase: 76 U/L (ref 38–126)
Bilirubin, Direct: 0.5 mg/dL — ABNORMAL HIGH (ref 0.0–0.2)
Indirect Bilirubin: 1.1 mg/dL — ABNORMAL HIGH (ref 0.3–0.9)
Total Bilirubin: 1.6 mg/dL — ABNORMAL HIGH (ref 0.0–1.2)
Total Protein: 8.4 g/dL — ABNORMAL HIGH (ref 6.5–8.1)

## 2023-10-25 LAB — BASIC METABOLIC PANEL WITH GFR
Anion gap: 16 — ABNORMAL HIGH (ref 5–15)
BUN: 67 mg/dL — ABNORMAL HIGH (ref 8–23)
CO2: 23 mmol/L (ref 22–32)
Calcium: 9.5 mg/dL (ref 8.9–10.3)
Chloride: 96 mmol/L — ABNORMAL LOW (ref 98–111)
Creatinine, Ser: 9.98 mg/dL — ABNORMAL HIGH (ref 0.61–1.24)
GFR, Estimated: 5 mL/min — ABNORMAL LOW (ref >60)
Glucose, Bld: 109 mg/dL — ABNORMAL HIGH (ref 70–99)
Potassium: 5 mmol/L (ref 3.5–5.1)
Sodium: 135 mmol/L (ref 135–145)

## 2023-10-25 LAB — PROTIME-INR
INR: 1.3 — ABNORMAL HIGH (ref 0.8–1.2)
Prothrombin Time: 16.8 s — ABNORMAL HIGH (ref 11.4–15.2)

## 2023-10-25 LAB — LACTIC ACID, PLASMA
Lactic Acid, Venous: 1.8 mmol/L (ref 0.5–1.9)
Lactic Acid, Venous: 1.5 mmol/L (ref 0.5–1.9)
Lactic Acid, Venous: 1.5 mmol/L (ref 0.5–1.9)
Lactic Acid, Venous: 1.5 mmol/L (ref 0.5–1.9)

## 2023-10-25 MED ORDER — LACTATED RINGERS IV BOLUS
500.0000 mL | Freq: Once | INTRAVENOUS | Status: AC
  Administered 2023-10-25 (×2): 500 mL via INTRAVENOUS

## 2023-10-25 MED ORDER — PIPERACILLIN-TAZOBACTAM 3.375 G IVPB 30 MIN
3.3750 g | Freq: Once | INTRAVENOUS | Status: AC
  Administered 2023-10-25 (×2): 3.375 g via INTRAVENOUS
  Filled 2023-10-25: qty 50

## 2023-10-25 MED ORDER — HEPARIN BOLUS VIA INFUSION
4000.0000 [IU] | Freq: Once | INTRAVENOUS | Status: AC
  Administered 2023-10-25 (×2): 4000 [IU] via INTRAVENOUS
  Filled 2023-10-25: qty 4000

## 2023-10-25 MED ORDER — ACETAMINOPHEN 650 MG RE SUPP: 650.0000 mg | Freq: Four times a day (QID) | RECTAL | Status: DC | PRN

## 2023-10-25 MED ORDER — HEPARIN (PORCINE) 25000 UT/250ML-% IV SOLN
1900.0000 [IU]/h | INTRAVENOUS | Status: DC
  Administered 2023-10-25 (×2): 1300 [IU]/h via INTRAVENOUS
  Administered 2023-10-26 (×2): 1500 [IU]/h via INTRAVENOUS
  Administered 2023-10-27 (×2): 1900 [IU]/h via INTRAVENOUS
  Filled 2023-10-25 (×2): qty 250

## 2023-10-25 MED ORDER — PIPERACILLIN-TAZOBACTAM IN DEX 2-0.25 GM/50ML IV SOLN
2.2500 g | Freq: Three times a day (TID) | INTRAVENOUS | Status: DC
  Administered 2023-10-26 – 2023-11-01 (×26): 2.25 g via INTRAVENOUS
  Filled 2023-10-25 (×22): qty 50

## 2023-10-25 MED ORDER — ONDANSETRON HCL 4 MG PO TABS
4.0000 mg | ORAL_TABLET | Freq: Four times a day (QID) | ORAL | Status: DC | PRN
  Administered 2023-11-13: 4 mg via ORAL
  Filled 2023-10-25: qty 1

## 2023-10-25 MED ORDER — BISACODYL 5 MG PO TBEC: 5.0000 mg | DELAYED_RELEASE_TABLET | Freq: Every day | ORAL | Status: DC | PRN

## 2023-10-25 MED ORDER — CALCIUM ACETATE (PHOS BINDER) 667 MG PO CAPS
2001.0000 mg | ORAL_CAPSULE | Freq: Three times a day (TID) | ORAL | Status: DC
  Administered 2023-10-25 – 2023-11-11 (×30): 2001 mg via ORAL
  Filled 2023-10-25 (×36): qty 3

## 2023-10-25 MED ORDER — SODIUM BICARBONATE 650 MG PO TABS
650.0000 mg | ORAL_TABLET | Freq: Two times a day (BID) | ORAL | Status: DC
  Administered 2023-10-26 – 2023-10-30 (×13): 650 mg via ORAL
  Filled 2023-10-25 (×9): qty 1

## 2023-10-25 MED ORDER — VANCOMYCIN HCL 2000 MG/400ML IV SOLN
2000.0000 mg | Freq: Once | INTRAVENOUS | Status: AC
  Administered 2023-10-25 (×2): 2000 mg via INTRAVENOUS
  Filled 2023-10-25: qty 400

## 2023-10-25 MED ORDER — SODIUM CHLORIDE 0.9 % IV SOLN: INTRAVENOUS | Status: AC

## 2023-10-25 MED ORDER — METOPROLOL SUCCINATE ER 50 MG PO TB24
75.0000 mg | ORAL_TABLET | Freq: Every day | ORAL | Status: DC
  Administered 2023-10-25 – 2023-11-13 (×16): 75 mg via ORAL
  Filled 2023-10-25 (×3): qty 1
  Filled 2023-10-25: qty 3
  Filled 2023-10-25 (×12): qty 1

## 2023-10-25 MED ORDER — CHLORHEXIDINE GLUCONATE CLOTH 2 % EX PADS
6.0000 | MEDICATED_PAD | Freq: Every day | CUTANEOUS | Status: DC
  Administered 2023-10-26 – 2023-10-31 (×7): 6 via TOPICAL

## 2023-10-25 MED ORDER — ACETAMINOPHEN 325 MG PO TABS
650.0000 mg | ORAL_TABLET | Freq: Four times a day (QID) | ORAL | Status: DC | PRN
  Filled 2023-10-25 (×3): qty 2

## 2023-10-25 MED ORDER — VANCOMYCIN HCL IN DEXTROSE 1-5 GM/200ML-% IV SOLN
1000.0000 mg | INTRAVENOUS | Status: DC
  Administered 2023-10-27 (×2): 1000 mg via INTRAVENOUS
  Filled 2023-10-25: qty 200

## 2023-10-25 MED ORDER — ONDANSETRON HCL 4 MG/2ML IJ SOLN: 4.0000 mg | Freq: Four times a day (QID) | INTRAMUSCULAR | Status: DC | PRN

## 2023-10-25 MED ORDER — IOHEXOL 350 MG/ML SOLN
100.0000 mL | Freq: Once | INTRAVENOUS | Status: AC | PRN
  Administered 2023-10-25 (×2): 100 mL via INTRAVENOUS

## 2023-10-25 MED ORDER — OXYCODONE-ACETAMINOPHEN 5-325 MG PO TABS
1.0000 | ORAL_TABLET | Freq: Once | ORAL | Status: AC
  Administered 2023-10-25 (×2): 1 via ORAL
  Filled 2023-10-25: qty 1

## 2023-10-25 MED ORDER — IRBESARTAN 300 MG PO TABS
300.0000 mg | ORAL_TABLET | Freq: Every day | ORAL | Status: DC
  Administered 2023-10-25 – 2023-11-13 (×18): 300 mg via ORAL
  Filled 2023-10-25 (×17): qty 1

## 2023-10-25 MED ORDER — ALBUTEROL SULFATE (2.5 MG/3ML) 0.083% IN NEBU: 2.5000 mg | INHALATION_SOLUTION | Freq: Four times a day (QID) | RESPIRATORY_TRACT | Status: DC | PRN

## 2023-10-25 MED ORDER — OXYCODONE HCL 5 MG PO TABS
5.0000 mg | ORAL_TABLET | ORAL | Status: DC | PRN
  Administered 2023-10-25 – 2023-10-28 (×13): 5 mg via ORAL
  Filled 2023-10-25 (×7): qty 1

## 2023-10-25 MED ORDER — ATORVASTATIN CALCIUM 10 MG PO TABS
10.0000 mg | ORAL_TABLET | Freq: Every day | ORAL | Status: DC
  Administered 2023-10-25 – 2023-11-13 (×21): 10 mg via ORAL
  Filled 2023-10-25 (×20): qty 1

## 2023-10-25 MED ORDER — IPRATROPIUM-ALBUTEROL 0.5-2.5 (3) MG/3ML IN SOLN: 3.0000 mL | Freq: Four times a day (QID) | RESPIRATORY_TRACT | Status: DC | PRN

## 2023-10-25 MED ORDER — FERROUS SULFATE 325 (65 FE) MG PO TABS
325.0000 mg | ORAL_TABLET | Freq: Every day | ORAL | Status: DC
  Administered 2023-10-26 – 2023-11-13 (×15): 325 mg via ORAL
  Filled 2023-10-25 (×16): qty 1

## 2023-10-25 NOTE — ED Notes (Signed)
 Patient left the floor alert and oriented with his belongings and staff.

## 2023-10-25 NOTE — ED Notes (Signed)
 Patient states  y'all are all liars, Im calling the state, you have let me starve all day, called the kitchen so the patient could hear that his tray had been ordered and on the way, advised the patient that he could have also eatem the [place] sandwich and snacks that were given to him instead of refusing them.

## 2023-10-25 NOTE — ED Triage Notes (Signed)
 Pt arrived via EMS c/o R foot pain/swelling x 3 days & spider bite to L calf that is not getting better with antibiotics.

## 2023-10-25 NOTE — H&P (Signed)
 History and Physical    Jacob Daniel FMW:[id number] DOB: [birth year] DOA: 10/25/2023  PCP: Pcp, No   Patient coming from:Home Chief Complaint  Patient presents with   Leg Pain   Foot Pain    HPI: Jacob Daniel 68 year old man with multiple medical comorbidities including ESRD on HD MWF, failed renal transplant, HTN HLD PAF, chronic systolic CHF/pulmonary hypertension/heart failure, CAD, OSA metabolic bone disease presented to the ED with complaint of right foot pain and swelling x 3 days.  About 3 weeks ago was bit by a spider on his left calf seen by PCP, was given 7 days of doxycycline  which he completed without improvement, initially about 2 cm dark area on the posterior left lower leg, he is having pain on rt grt toes for 3 days and swelling pain on rt foot as well and has been getting worse with swelling and pain. Patient otherwise denies any nausea, vomiting, chest pain, shortness of breath, fever, chills, headache, focal weakness, numbness tingling, speech difficulties   In the ED vitals stable labs potassium 5.0 BUN 67 creatinine 9.9 total bili 1.6 albumin  2.8 lactic acid 1.8> 3.8, labs showed mild anemia and thrombocytopenia.  Blood culture obtained. Initially unable to Doppler pulses in all 5 toes very faint dopplerable pulses in the right PT and DP concern for limb ischemia, vascular surgery was consulted, heparin  was given imaging CT angio runoff/x-ray foot-showed confirmation of vasculopathy with concern for popliteal artery occlusion. Patient was placed on vancomycin , Zosyn  due to concern of cellulitis and/or osteomyelitis. Admission was requested for further management.   Assessment/Plan Principal Problem:   Popliteal artery occlusion, right (HCC) Active Problems:   ESRD (end stage renal disease) on dialysis (HCC)   Chronic systolic CHF (congestive heart failure) (HCC)   History of CAD (coronary artery disease)   Essential hypertension   Hyperlipidemia   Paroxysmal  atrial fibrillation (HCC)   OSA (obstructive sleep apnea)  Left lower extremity cellulitis, non healing wound without thrombosis or stenosis on the left proximal vessel but delayed contrast opacification on popliteal artery Right leg pain with thrombosis of the popliteal artery starting above the knee, nonopacification of the trifurcation vessels below the knee: Continue IV antibiotics vancomycin  and Zosyn  pharmacy to dose.  Vascular surgery's been consulted, continue heparin  drip. Continue multimodal pain management opiates and opiates  Lactic acidosis: Suspect due to patient's leg ischemia versus infection.  Monitor labs  ESRD on HD MWF Failed renal transplant Anemia of renal disease Metabolic bone disease: Will consult nephrology for HD MWF.  Last HD was on Friday.Hemoglobin stable will monitor.  Monitor calcium  phos and resume home meds-PhosLo , ferrous sulfate  Will confirm if he is still on tacrolimus   Hypoalbuminemia Elevated TB: Augment diet monitor labs  HTN: BP stable-continue home metoprolol  and irbesartan   HLD: Resume Lipitor  PAF: PTA on Eliquis  currently on heparin   Chronic systolic CHF/pulmonary hypertension/heart failure Address fluid status volume with dialysis  CAD No chest pain.  OSA Did not tolerate so not using  OF NOTE HE HAS NOT BEEN TAKING HIS MEDS REGULARLY AS  THEY DON'T DO GOOD   Severity of Illness: The appropriate patient status for this patient is INPATIENT. Inpatient status is judged to be reasonable and necessary in order to provide the required intensity of service to ensure the patient's safety. The patient's presenting symptoms, physical exam findings, and initial radiographic and laboratory data in the context of their chronic comorbidities is felt to place them at high risk for further clinical  deterioration. Furthermore, it is not anticipated that the patient will be medically stable for discharge from the hospital within 2 midnights of  admission.   * I certify that at the point of admission it is my clinical judgment that the patient will require inpatient hospital care spanning beyond 2 midnights from the point of admission due to high intensity of service, high risk for further deterioration and high frequency of surveillance required.*   DVT prophylaxis:  HEPARIN  Code Status:   Code Status: Full Code  Family Communication: Admission, patients condition and plan of care including tests being ordered have been discussed with the patient who indicate understanding and agree with the plan and Code Status.  Consults called:  NEPHRO AND VVS  Review of Systems: All systems were reviewed and were negative except as mentioned in HPI above. Negative for fever Negative for chest pain Negative for shortness of breath  Past Medical History:  Diagnosis Date   Arthritis    CHF (congestive heart failure) (HCC)    Diabetes mellitus without complication (HCC)    ESRD on hemodialysis (HCC)    Hypertension     Past Surgical History:  Procedure Laterality Date   IR PARACENTESIS  05/06/2023   KIDNEY TRANSPLANT  2010   RIGHT/LEFT HEART CATH AND CORONARY ANGIOGRAPHY N/A 04/08/2023   Procedure: RIGHT/LEFT HEART CATH AND CORONARY ANGIOGRAPHY;  Surgeon: Ladona Heinz, MD;  Location: [facility] INVASIVE CV LAB;  Service: Cardiovascular;  Laterality: N/A;   SPINE SURGERY       reports that he has never smoked. He has never used smokeless tobacco. He reports that he does not currently use alcohol after a past usage of about 1.0 standard drink of alcohol per week. He reports current drug use. Drug: Marijuana.  No Known Allergies  History reviewed. No pertinent family history.   Prior to Admission medications   Medication Sig Start Date End Date Taking? Authorizing Provider  acetaminophen -codeine (TYLENOL  #2) 300-15 MG tablet Take 1 tablet by mouth every 8 (eight) hours as needed for moderate pain (pain score 4-6). 02/25/23   [provider]  albuterol  (ACCUNEB ) 0.63 MG/3ML nebulizer solution Take 1 ampule by nebulization every 6 (six) hours as needed for wheezing.    [provider]  albuterol  (VENTOLIN  HFA) 108 (90 Base) MCG/ACT inhaler Inhale 2 puffs into the lungs every 6 (six) hours as needed for wheezing.    [provider]  apixaban  (ELIQUIS ) 5 MG TABS tablet Take 1 tablet (5 mg total) by mouth 2 (two) times daily. 04/10/23   Jillian Buttery, MD  atorvastatin  (LIPITOR) 10 MG tablet Take 1 tablet (10 mg total) by mouth daily. 04/10/23 01/13/27  Adhikari, Amrit, MD  calcium  acetate (PHOSLO ) 667 MG capsule Take 3 capsules (2,001 mg total) by mouth 3 (three) times daily with meals. 04/10/23   Adhikari, Amrit, MD  doxycycline  (VIBRAMYCIN ) 100 MG capsule Take 1 capsule (100 mg total) by mouth 2 (two) times daily. 09/28/23   Flint Sonny POUR, PA-C  ferrous sulfate  325 (65 FE) MG tablet Take 1 tablet (325 mg total) by mouth daily with breakfast. 04/11/23   Jillian Buttery, MD  ipratropium-albuterol  (DUONEB) 0.5-2.5 (3) MG/3ML SOLN Take 3 mLs by nebulization every 6 (six) hours as needed (for shortness of breath and wheezing). 02/16/23   [provider]  irbesartan  (AVAPRO ) 300 MG tablet Take 1 tablet (300 mg total) by mouth daily. 04/11/23   Jillian Buttery, MD  magnesium  oxide (MAG-OX) 400 MG tablet Take 1 tablet (  400 mg total) by mouth daily. Patient taking differently: Take 400 mg by mouth daily. On Mon, Wed, and Fri 04/10/23   Jillian Buttery, MD  metoprolol  succinate (TOPROL -XL) 25 MG 24 hr tablet Take 3 tablets (75 mg total) by mouth daily. 04/11/23   Adhikari, Amrit, MD  sildenafil (REVATIO) 20 MG tablet Take 20-60 mg by mouth daily as needed (erectile dysfunction). 06/03/20   [provider]  sodium bicarbonate  650 MG tablet Take 1 tablet (650 mg total) by mouth 2 (two) times daily. 04/10/23   Jillian Buttery, MD  tacrolimus  (PROGRAF ) 0.5 MG capsule Take 2 capsules (1 mg total) by mouth daily.  04/10/23   Jillian Buttery, MD    Physical Exam: Vitals:   10/25/23 0947 10/25/23 1009 10/25/23 1012 10/25/23 1402  BP:  (!) 164/107  (!) 163/101  Pulse:  76  70  Resp:  17  (!) 22  Temp:  97.8 F (36.6 C)  97.8 F (36.6 C)  TempSrc:    Oral  SpO2: 100% 100%  100%  Weight:   84.8 kg   Height:   6' 1 (1.854 m)     General exam: AAOX3 CHRONICALLY ILL LOOKING , NAD, weak appearing. HEENT:Oral mucosa moist, Ear/Nose WNL grossly, dentition normal. Respiratory system: bilaterally CLEAR,no wheezing or crackles,no use of accessory muscle Cardiovascular system: S1 & S2 +, No JVD,. Gastrointestinal system: Abdomen soft, NT,ND, BS+ Nervous System:Alert, awake, moving extremities and grossly nonfocal Extremities: No edema, distal peripheral pulses non pappable on rt foot and b/l foot cold Skin: No rashes,no icterus. MSK: Normal muscle bulk,tone, power        Labs on Admission: I have personally reviewed following labs and imaging studies  CBC: Recent Labs  Lab 10/25/23 1123  WBC 5.0  NEUTROABS 3.5  HGB 10.9*  HCT 33.3*  MCV 86.9  PLT 145*   Basic Metabolic Panel: Recent Labs  Lab 10/25/23 1123  NA 135  K 5.0  CL 96*  CO2 23  GLUCOSE 109*  BUN 67*  CREATININE 9.98*  CALCIUM  9.5   Estimated Creatinine Clearance: 8 mL/min (A) (by C-G formula based on SCr of 9.98 mg/dL (H)). Recent Labs  Lab 10/25/23 1123  AST 22  ALT 16  ALKPHOS 76  BILITOT 1.6*  PROT 8.4*  ALBUMIN  2.8*   No results for input(s): LIPASE, AMYLASE in the last 168 hours. No results for input(s): AMMONIA in the last 168 hours. Coagulation Profile: Recent Labs  Lab 10/25/23 1123  INR 1.3*   Cardiac Panel (last 3 results) No results for input(s): CKTOTAL, CKMB, TROPONINIHS, RELINDX in the last 72 hours.  BNP (last 3 results) No results for input(s): PROBNP in the last 8760 hours. HbA1C: No results for input(s): HGBA1C in the last 72 hours. CBG: No results for input(s):  GLUCAP in the last 168 hours. Lipid Profile: No results for input(s): CHOL, HDL, LDLCALC, TRIG, CHOLHDL, LDLDIRECT in the last 72 hours. Thyroid Function Tests: No results for input(s): TSH, T4TOTAL, FREET4, T3FREE, THYROIDAB in the last 72 hours. Urine analysis: No results found for: COLORURINE, APPEARANCEUR, LABSPEC, PHURINE, GLUCOSEU, HGBUR, BILIRUBINUR, KETONESUR, PROTEINUR, UROBILINOGEN, NITRITE, LEUKOCYTESUR  Radiological Exams on Admission: CT Angio Aortobifemoral W and/or Wo Contrast Result Date: 10/25/2023 CLINICAL DATA:  Claudication or leg ischemia, right foot pain/swelling for 3 days EXAM: CT ANGIOGRAPHY CHEST WITH CONTRAST TECHNIQUE: Multidetector CT imaging of the abdomen and pelvis with run-off to both lower extremities was performed using the standard protocol during bolus administration of intravenous contrast.  Multiplanar CT image reconstructions and MIPs were obtained to evaluate the vascular anatomy. RADIATION DOSE REDUCTION: This exam was performed according to the departmental dose-optimization program which includes automated exposure control, adjustment of the mA and/or kV according to patient size and/or use of iterative reconstruction technique. CONTRAST:  OMNIPAQUE  IOHEXOL  350 MG/ML SOLN COMPARISON:  May 04, 2023 FINDINGS: VASCULAR Aorta: No aneurysm or aortic dissection. No acute thrombus. Diffuse calcified atherosclerosis throughout. Celiac: Patent without aneurysm or dissection.No hemodynamically significant stenosis. SMA: Patent without aneurysm or dissection.No hemodynamically significant stenosis. Renals: Patent without aneurysm or dissection.Diffusely calcified with moderate stenoses of both renal ostia from calcified plaques. IMA: Patent without aneurysm or dissection.Moderate to severe stenosis of the ostium due to calcified plaque. RIGHT Lower Extremity Inflow: Common, internal and external iliac arteries are  patent without aneurysm, acute thrombus, or dissection. Diffuse calcified atherosclerosis throughout without hemodynamically significant stenosis. Outflow: The common and proximal superficial femoral arteries are patent with diffuse calcified plaque throughout. The profunda femoral artery is also patent with diffuse calcified atherosclerosis throughout. Decreased opacification of the mid and distal superficial femoral artery with thrombosis of the popliteal artery starting in the above knee segment. Runoff: Extensive calcified atherosclerosis throughout the trifurcation vessels, which are not opacified. LEFT Lower Extremity Inflow: Common, internal and external iliac arteries are patent without aneurysm, acute thrombus, or dissection. Diffuse calcified atherosclerosis throughout without hemodynamically significant stenosis. Outflow: Common, superficial, and profunda femoral are patent without acute thrombus, aneurysm, or dissection. Diffuse calcified atherosclerosis throughout the common and superficial femoral arteries without hemodynamically significant stenosis. The popliteal artery is also diffusely calcified. There is delayed perfusion of the above and behind knee popliteal artery. Runoff: Extensive calcified atherosclerosis throughout the trifurcation vessels, which are not opacified. Veins: Not evaluated due to the phase of contrast. Review of the MIP images confirms the above findings. NON-VASCULAR Lower chest: Mild diffuse prominence of the pulmonary interstitium with intralobular septal thickening.Bibasilar atelectasis. Mild cardiomegaly with densely calcified coronary arteries. Hepatobiliary: No mass.Multiple small radiopaque gallstones. No wall thickening.No intrahepatic or extrahepatic biliary ductal dilation. Pancreas: No mass or main ductal dilation.No peripancreatic inflammation or fluid collection. Spleen: Normal size. No mass. Adrenals/Urinary Tract: No adrenal masses. The renal cortices of both  kidneys are full of cysts. No hydronephrosis or nephrolithiasis. The urinary bladder is completely decompressed. 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 Stomach/Bowel: The stomach is decompressed without focal abnormality. No small bowel wall thickening or inflammation. No small bowel obstruction. Scattered total colonic diverticulosis. Vascular/Lymphatic: No intraabdominal or pelvic lymphadenopathy. Reproductive: No prostatomegaly.No free pelvic fluid. Other: Moderate volume ascites.  No pneumoperitoneum. Musculoskeletal: No acute fracture or destructive lesion.Subcutaneous edema in both calves. Bone changes consistent with renal osteodystrophy. Multilevel degenerative disc disease of the spine. IMPRESSION: VASCULAR No abdominal aortic aneurysm or aortic dissection. Extensive aortic atherosclerosis without hemodynamically significant stenosis. Severe peripheral vascular atherosclerosis also noted in both legs. Aortic Atherosclerosis (ICD10-I70.0). Right leg: 1. Diffuse calcified atherosclerosis throughout the inflow and proximal outflow vessels. Decreased opacification of the mid and distal superficial femoral artery with thrombosis of the popliteal artery starting in the above knee segment. 2. Non opacification of the trifurcation vessels below the knee, which may be due to non opacification or thrombosis. Left leg: 1. No acute thrombus or hemodynamically significant stenosis within the inflow or proximal outflow vessels. 2. Delayed contrast opacification of the above and behind knee popliteal artery without significant opacification of the trifurcation vessels below the knee, possibly due to slow flow. NON-VASCULAR 1. Mild prominence  of the pulmonary interstitium in both lung bases with minimal intralobular septal thickening, likely representing early changes of edema. 2. Total colonic diverticulosis. No changes of acute diverticulitis. 3. Few small calcified gallstones present. 4. Moderate volume  ascites. Critical Value/emergent results were called by telephone at the time of interpretation on 10/25/2023 at 3:28 pm to provider BERNARDINO FIREMAN , who verbally acknowledged these results. Electronically Signed   By: Rogelia Myers M.D.   On: 10/25/2023 15:37   DG Foot 2 Views Right Result Date: 10/25/2023 CLINICAL DATA:  Foot pain, second digit EXAM: RIGHT FOOT - 2 VIEW COMPARISON:  None Available. FINDINGS: There is no evidence of fracture or dislocation. Sclerotic lesion in distal end of the second metatarsal bone likely bone island, similar to prior exam from 2021. There is no evidence of arthropathy or other focal bone abnormality. Small calcaneal spur. Diffuse vascular calcifications. Soft tissues are otherwise unremarkable. IMPRESSION: No acute findings. Electronically Signed   By: Megan  Zare M.D.   On: 10/25/2023 12:05    Mennie LAMY MD [facility]  If 7PM-7AM, please contact night-coverage [email or web address]  10/25/2023, 4:52 PM

## 2023-10-25 NOTE — Progress Notes (Signed)
 PHARMACY - ANTICOAGULATION CONSULT NOTE  Pharmacy Consult for heparin  Indication: Limb Ischemia  No Known Allergies  Patient Measurements: Height: 6' 1 (185.4 cm) Weight: 84.8 kg (187 lb) IBW/kg (Calculated) : 79.9 HEPARIN  DW (KG): 84.8  Vital Signs: Temp: 97.8 F (36.6 C) (08/11 1402) Temp Source: Oral (08/11 1402) BP: 163/101 (08/11 1402) Pulse Rate: 70 (08/11 1402)  Labs: Recent Labs    10/25/23 1123  HGB 10.9*  HCT 33.3*  PLT 145*  LABPROT 16.8*  INR 1.3*  CREATININE 9.98*    Estimated Creatinine Clearance: 8 mL/min (A) (by C-G formula based on SCr of 9.98 mg/dL (H)).   Medical History: Past Medical History:  Diagnosis Date   Arthritis    CHF (congestive heart failure) (HCC)    Diabetes mellitus without complication (HCC)    ESRD on hemodialysis (HCC)    Hypertension     Assessment: 75 YOM presenting with leg and foot pain, concern for ischemic limb, he has hx of afib on Eliquis  PTA however pt states he is not sure the last time he took it, likely more than a month ago per pt.    Goal of Therapy:  Heparin  level 0.3-0.7 units/ml Monitor platelets by anticoagulation protocol: Yes   Plan:  Heparin  4000 units IV x 1 and gtt at 1300 units/hr F/u 8 hour heparin  level F/u VVS eval and recs   Dorn Poot, PharmD, [facility] [address] Clinical Pharmacist ED Pharmacist Phone # [phone] 10/25/2023 3:47 PM

## 2023-10-25 NOTE — ED Notes (Signed)
 Dinner tray delivered.

## 2023-10-25 NOTE — Consult Note (Signed)
 Hospital Consult    Reason for Consult: Lower extremity wounds with vascular disease Requesting Physician: Emergency department MRN #:  [id number]  History of Present Illness: This is a 68 y.o. male whom I was called regarding concerns for critical limb ischemia with tissue loss.    On exam Jacob Daniel was doing well.  Originally from [place] , he now lives in [place].  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  Jacob Daniel presented to the ED with right foot pain.  He stated 3 weeks ago, he had a spider bite on his left calf, which has failed to heal, however 3 days ago, he woke up with pain in the right foot with second digit pain and swelling.  The pain has been persistent.  He has noted swelling at the toes.  Denies history of vascular surgery. Uses a cane to ambulate. Prior history of kidney transplant in 2010.  Past Medical History:  Diagnosis Date   Arthritis    CHF (congestive heart failure) (HCC)    Diabetes mellitus without complication (HCC)    ESRD on hemodialysis (HCC)    Hypertension     Past Surgical History:  Procedure Laterality Date   IR PARACENTESIS  05/06/2023   KIDNEY TRANSPLANT  2010   RIGHT/LEFT HEART CATH AND CORONARY ANGIOGRAPHY N/A 04/08/2023   Procedure: RIGHT/LEFT HEART CATH AND CORONARY ANGIOGRAPHY;  Surgeon: Ladona Heinz, MD;  Location: [facility];  Service: Cardiovascular;  Laterality: N/A;   SPINE SURGERY      No Known Allergies  Prior to Admission medications   Medication Sig Start Date End Date Taking? Authorizing Provider  acetaminophen -codeine (TYLENOL  #2) 300-15 MG tablet Take 1 tablet by mouth every 8 (eight) hours as needed for moderate pain (pain score 4-6). 02/25/23   [provider]  albuterol  (ACCUNEB ) 0.63 MG/3ML nebulizer solution Take 1 ampule by nebulization every 6 (six) hours as needed for wheezing.    [provider]  albuterol  (VENTOLIN  HFA) 108 (90 Base) MCG/ACT inhaler Inhale 2 puffs  into the lungs every 6 (six) hours as needed for wheezing.    [provider]  apixaban  (ELIQUIS ) 5 MG TABS tablet Take 1 tablet (5 mg total) by mouth 2 (two) times daily. 04/10/23   Jillian Buttery, MD  atorvastatin  (LIPITOR) 10 MG tablet Take 1 tablet (10 mg total) by mouth daily. 04/10/23 01/13/27  Jillian Buttery, MD  calcium  acetate (PHOSLO ) 667 MG capsule Take 3 capsules (2,001 mg total) by mouth 3 (three) times daily with meals. 04/10/23   Jillian Buttery, MD  doxycycline  (VIBRAMYCIN ) 100 MG capsule Take 1 capsule (100 mg total) by mouth 2 (two) times daily. 09/28/23   Flint Sonny POUR, PA-C  ferrous sulfate  325 (65 FE) MG tablet Take 1 tablet (325 mg total) by mouth daily with breakfast. 04/11/23   Jillian Buttery, MD  ipratropium-albuterol  (DUONEB) 0.5-2.5 (3) MG/3ML SOLN Take 3 mLs by nebulization every 6 (six) hours as needed (for shortness of breath and wheezing). 02/16/23   [provider]  irbesartan  (AVAPRO ) 300 MG tablet Take 1 tablet (300 mg total) by mouth daily. 04/11/23   Jillian Buttery, MD  magnesium  oxide (MAG-OX) 400 MG tablet Take 1 tablet (400 mg total) by mouth daily. Patient taking differently: Take 400 mg by mouth daily. On Mon, Wed, and Fri 04/10/23   Jillian Buttery, MD  metoprolol  succinate (TOPROL -XL) 25 MG 24 hr tablet Take 3 tablets (75 mg total) by mouth daily. 04/11/23   Adhikari, Amrit,  MD  sildenafil (REVATIO) 20 MG tablet Take 20-60 mg by mouth daily as needed (erectile dysfunction). 06/03/20   [provider]  sodium bicarbonate  650 MG tablet Take 1 tablet (650 mg total) by mouth 2 (two) times daily. 04/10/23   Jillian Buttery, MD  tacrolimus  (PROGRAF ) 0.5 MG capsule Take 2 capsules (1 mg total) by mouth daily. 04/10/23   Jillian Buttery, MD    Social History   Socioeconomic History   Marital status: Divorced    Spouse name: Not on file   Number of children: Not on file   Years of education: Not on file   Highest education level: Not on  file  Occupational History   Not on file  Tobacco Use   Smoking status: Never   Smokeless tobacco: Never  Substance and Sexual Activity   Alcohol use: Not Currently    Alcohol/week: 1.0 standard drink of alcohol    Types: 1 Cans of beer per week   Drug use: Yes    Types: Marijuana    Comment: as many times as i can get it    Sexual activity: Not on file  Other Topics Concern   Not on file  Social History Narrative   Not on file   Social Drivers of Health   Financial Resource Strain: Low Risk  (11/05/2022)   Received from [facility]   Overall Financial Resource Strain (CARDIA)    Difficulty of Paying Living Expenses: Not hard at all  Food Insecurity: No Food Insecurity (05/05/2023)   Hunger Vital Sign    Worried About Running Out of Food in the Last Year: Never true    Ran Out of Food in the Last Year: Never true  Transportation Needs: No Transportation Needs (05/05/2023)   PRAPARE - Administrator, Civil Service (Medical): No    Lack of Transportation (Non-Medical): No  Physical Activity: Not on file  Stress: Not on file  Social Connections: Moderately Integrated (05/05/2023)   Social Connection and Isolation Panel    Frequency of Communication with Friends and Family: More than three times a week    Frequency of Social Gatherings with Friends and Family: Three times a week    Attends Religious Services: More than 4 times per year    Active Member of Clubs or Organizations: Yes    Attends Banker Meetings: More than 4 times per year    Marital Status: Divorced  Intimate Partner Violence: Not At Risk (05/05/2023)   Humiliation, Afraid, Rape, and Kick questionnaire    Fear of Current or Ex-Partner: No    Emotionally Abused: No    Physically Abused: No    Sexually Abused: No    History reviewed. No pertinent family history.  ROS: Otherwise negative unless mentioned in HPI  Physical Examination  Vitals:   10/25/23 1009 10/25/23 1402  BP:  (!) 164/107 (!) 163/101  Pulse: 76 70  Resp: 17 (!) 22  Temp: 97.8 F (36.6 C) 97.8 F (36.6 C)  SpO2: 100% 100%   Body mass index is 24.67 kg/m.  General: No acute distress.  Disheveled, poor hygiene. Gait: Not observed HENT: WNL, normocephalic Pulmonary: normal non-labored breathing, without Rales, rhonchi,  wheezing Cardiac: regular Abdomen:  soft, NT/ND, no masses Skin: without rashes Vascular Exam/Pulses: 2+ femorals. On the right, peroneal signal multiphasic, dorsalis pedis signal, on the left peroneal signal, dorsalis pedis signal, posterior tibial artery signal Extremities: without ischemic changes, without Gangrene , without cellulitis; with open wounds;  Musculoskeletal: no muscle wasting or atrophy  Neurologic: A&O X 3;  No focal weakness or paresthesias are detected; speech is fluent/normal Psychiatric:  The pt has Normal affect. Lymph:  Unremarkable  CBC    Component Value Date/Time   WBC 5.0 10/25/2023 1123   RBC 3.83 (L) 10/25/2023 1123   HGB 10.9 (L) 10/25/2023 1123   HCT 33.3 (L) 10/25/2023 1123   PLT 145 (L) 10/25/2023 1123   MCV 86.9 10/25/2023 1123   MCH 28.5 10/25/2023 1123   MCHC 32.7 10/25/2023 1123   RDW 16.8 (H) 10/25/2023 1123   LYMPHSABS 0.7 10/25/2023 1123   MONOABS 0.5 10/25/2023 1123   EOSABS 0.3 10/25/2023 1123   BASOSABS 0.1 10/25/2023 1123    BMET    Component Value Date/Time   NA 135 10/25/2023 1123   K 5.0 10/25/2023 1123   CL 96 (L) 10/25/2023 1123   CO2 23 10/25/2023 1123   GLUCOSE 109 (H) 10/25/2023 1123   BUN 67 (H) 10/25/2023 1123   CREATININE 9.98 (H) 10/25/2023 1123   CALCIUM  9.5 10/25/2023 1123   GFRNONAA 5 (L) 10/25/2023 1123    COAGS: Lab Results  Component Value Date   INR 1.3 (H) 10/25/2023   INR 1.4 (H) 05/05/2023       ASSESSMENT/PLAN: This is a 68 y.o. male presenting with a necrotic area on the left calf believed to be caused by a spider. More concerning is the right lower extremity rest pain with  tenderness in the calf.  He has small, 2 ulcerations.  I have ordered an ABI to further define his perfusion, however on physical exam he has critical limb ischemia with tissue loss.  Jacob Daniel and I discussed bilateral lower extremity angiogram in effort to find improve distal perfusion for wound healing. Plan will be for Wednesday.  After discussing the risks and benefits, Jacob Daniel elected to proceed.    Fonda FORBES Rim MD MS [facility] [phone] 10/25/2023  6:25 PM

## 2023-10-25 NOTE — Progress Notes (Signed)
 Pharmacy Antibiotic Note  Jacob Daniel is a 68 y.o. male admitted on 10/25/2023 with cellulitis.  Pharmacy has been consulted for zosyn  dosing.  ESRD-HD usually MWF, vancomycin  2g Iv x 1 given in ED  Plan: Vancomycin  1g IV q HD Monitor HD schedule, Cx and clinical progression to narrow Vancomycin  random level as needed  Height: 6' 1 (185.4 cm) Weight: 84.8 kg (187 lb) IBW/kg (Calculated) : 79.9  Temp (24hrs), Avg:97.8 F (36.6 C), Min:97.8 F (36.6 C), Max:97.8 F (36.6 C)  Recent Labs  Lab 10/25/23 1123 10/25/23 1358 10/25/23 1557  WBC 5.0  --   --   CREATININE 9.98*  --   --   LATICACIDVEN 1.8 3.8* 1.5    Estimated Creatinine Clearance: 8 mL/min (A) (by C-G formula based on SCr of 9.98 mg/dL (H)).    No Known Allergies  Dorn Poot, PharmD, [facility] Clinical Pharmacist ED Pharmacist Phone # [phone] 10/25/2023 4:58 PM

## 2023-10-25 NOTE — Progress Notes (Signed)
 ED Pharmacy Antibiotic Sign Off An antibiotic consult was received from an ED provider for vancomycin  and zosyn  per pharmacy dosing for cellulitis. A chart review was completed to assess appropriateness.   The following one time order(s) were placed:  Vancomycin  2000mg  IV x1 Zosyn  3.375G IV x1  Further antibiotic and/or antibiotic pharmacy consults should be ordered by the admitting provider if indicated.   Thank you for allowing pharmacy to be a part of this patient's care.   Koren LITTIE Or, [facility]  Clinical Pharmacist 10/25/23 3:15 PM

## 2023-10-25 NOTE — ED Provider Notes (Signed)
 [facility] AT [facility] Provider Note   CSN: [id number] Arrival date & time: 10/25/23  1005     Patient presents with: Leg Pain and Foot Pain   Jacob Daniel is a 68 y.o. male.    Leg Pain Foot Pain  Patient presents for leg and foot pain.  Medical history includes CHF, DM, ESRD, HTN, gout, OSA, HLD, CAD.  He undergoes dialysis on M, W, F.  Last dialysis session was on Friday.  3 weeks ago, he states that he was bit by a spider on his left calf.  At that time, his PCP [facility].  Per chart review, he was noted to have a 2 cm dark area on left lower leg at the time.  He was prescribed 7 days of doxycycline .  He completed these antibiotics and did not have improvement in area of his left leg lesion.  His PCP gave him an additional course of antibiotics.  He feels that the pain in the area has slightly improved but the appearance has not.  Additionally, 3 days ago, he woke up with right foot second digit pain and swelling.  Pain and swelling has been persistent.  Swelling and pain have extended into the dorsum of his forefoot.     Prior to Admission medications   Medication Sig Start Date End Date Taking? Authorizing Provider  acetaminophen -codeine (TYLENOL  #2) 300-15 MG tablet Take 1 tablet by mouth every 8 (eight) hours as needed for moderate pain (pain score 4-6). 02/25/23   [provider]  albuterol  (ACCUNEB ) 0.63 MG/3ML nebulizer solution Take 1 ampule by nebulization every 6 (six) hours as needed for wheezing.    [provider]  albuterol  (VENTOLIN  HFA) 108 (90 Base) MCG/ACT inhaler Inhale 2 puffs into the lungs every 6 (six) hours as needed for wheezing.    [provider]  apixaban  (ELIQUIS ) 5 MG TABS tablet Take 1 tablet (5 mg total) by mouth 2 (two) times daily. 04/10/23   Jillian Buttery, MD  atorvastatin  (LIPITOR) 10 MG tablet Take 1 tablet (10 mg total) by mouth daily. 04/10/23 01/13/27   Jillian Buttery, MD  calcium  acetate (PHOSLO ) 667 MG capsule Take 3 capsules (2,001 mg total) by mouth 3 (three) times daily with meals. 04/10/23   Jillian Buttery, MD  doxycycline  (VIBRAMYCIN ) 100 MG capsule Take 1 capsule (100 mg total) by mouth 2 (two) times daily. 09/28/23   Flint Sonny POUR, PA-C  ferrous sulfate  325 (65 FE) MG tablet Take 1 tablet (325 mg total) by mouth daily with breakfast. 04/11/23   Jillian Buttery, MD  ipratropium-albuterol  (DUONEB) 0.5-2.5 (3) MG/3ML SOLN Take 3 mLs by nebulization every 6 (six) hours as needed (for shortness of breath and wheezing). 02/16/23   [provider]  irbesartan  (AVAPRO ) 300 MG tablet Take 1 tablet (300 mg total) by mouth daily. 04/11/23   Jillian Buttery, MD  magnesium  oxide (MAG-OX) 400 MG tablet Take 1 tablet (400 mg total) by mouth daily. Patient taking differently: Take 400 mg by mouth daily. On Mon, Wed, and Fri 04/10/23   Jillian Buttery, MD  metoprolol  succinate (TOPROL -XL) 25 MG 24 hr tablet Take 3 tablets (75 mg total) by mouth daily. 04/11/23   Adhikari, Amrit, MD  sildenafil (REVATIO) 20 MG tablet Take 20-60 mg by mouth daily as needed (erectile dysfunction). 06/03/20   [provider]  sodium bicarbonate  650 MG tablet Take 1 tablet (650 mg total) by mouth 2 (two) times  daily. 04/10/23   Jillian Buttery, MD  tacrolimus  (PROGRAF ) 0.5 MG capsule Take 2 capsules (1 mg total) by mouth daily. 04/10/23   Jillian Buttery, MD    Allergies: Patient has no known allergies.    Review of Systems  Musculoskeletal:  Positive for arthralgias and joint swelling.  Skin:  Positive for wound.  All other systems reviewed and are negative.   Updated Vital Signs BP (!) 163/101 (BP Location: Right Arm)   Pulse 70   Temp 97.8 F (36.6 C) (Oral)   Resp (!) 22   Ht 6' 1 (1.854 m)   Wt 84.8 kg   SpO2 100%   BMI 24.67 kg/m   Physical Exam Vitals and nursing note reviewed.  Constitutional:      General: He is not in acute distress.     Appearance: Normal appearance. He is well-developed. He is not ill-appearing, toxic-appearing or diaphoretic.  HENT:     Head: Normocephalic and atraumatic.     Right Ear: External ear normal.     Left Ear: External ear normal.     Nose: Nose normal.     Mouth/Throat:     Mouth: Mucous membranes are moist.  Eyes:     Extraocular Movements: Extraocular movements intact.     Conjunctiva/sclera: Conjunctivae normal.  Cardiovascular:     Rate and Rhythm: Normal rate and regular rhythm.     Comments: Fistula in left upper arm.  Very faint PT and DP pulses on the right foot, when compared to left.  I am not able to Doppler pulses in toes.  Pulmonary:     Effort: Pulmonary effort is normal. No respiratory distress.  Abdominal:     General: There is no distension.     Palpations: Abdomen is soft.  Musculoskeletal:        General: Swelling and tenderness present.     Cervical back: Normal range of motion and neck supple.  Skin:    General: Skin is warm and dry.     Findings: Erythema present.     Comments: Circular wound of left calf with central eschar and surrounding erythema and tenderness.  Neurological:     General: No focal deficit present.     Mental Status: He is alert and oriented to person, place, and time.  Psychiatric:        Mood and Affect: Mood normal.        Behavior: Behavior normal.        (all labs ordered are listed, but only abnormal results are displayed) Labs Reviewed  CBC WITH DIFFERENTIAL/PLATELET - Abnormal; Notable for the following components:      Result Value   RBC 3.83 (*)    Hemoglobin 10.9 (*)    HCT 33.3 (*)    RDW 16.8 (*)    Platelets 145 (*)    All other components within normal limits  PROTIME-INR - Abnormal; Notable for the following components:   Prothrombin Time 16.8 (*)    INR 1.3 (*)    All other components within normal limits  BASIC METABOLIC PANEL WITH GFR - Abnormal; Notable for the following components:   Chloride 96 (*)     Glucose, Bld 109 (*)    BUN 67 (*)    Creatinine, Ser 9.98 (*)    GFR, Estimated 5 (*)    Anion gap 16 (*)    All other components within normal limits  HEPATIC FUNCTION PANEL - Abnormal; Notable for the following components:  Total Protein 8.4 (*)    Albumin  2.8 (*)    Total Bilirubin 1.6 (*)    Bilirubin, Direct 0.5 (*)    Indirect Bilirubin 1.1 (*)    All other components within normal limits  I-STAT CG4 LACTIC ACID, ED - Abnormal; Notable for the following components:   Lactic Acid, Venous 3.8 (*)    All other components within normal limits  CULTURE, BLOOD (ROUTINE X 2)  CULTURE, BLOOD (ROUTINE X 2)  LACTIC ACID, PLASMA  LACTIC ACID, PLASMA  LACTIC ACID, PLASMA  LACTIC ACID, PLASMA  HEPARIN  LEVEL (UNFRACTIONATED)    EKG: EKG Interpretation Date/Time:  Monday October 25 2023 11:01:02 EDT Ventricular Rate:  74 PR Interval:  224 QRS Duration:  101 QT Interval:  455 QTC Calculation: 505 R Axis:   90  Text Interpretation: Sinus rhythm Prolonged PR interval Borderline right axis deviation Borderline repolarization abnormality Prolonged QT interval Confirmed by Melvenia Motto [phone]) on 10/25/2023 12:24:08 PM  Radiology: CT Angio Aortobifemoral W and/or Wo Contrast Result Date: 10/25/2023 CLINICAL DATA:  Claudication or leg ischemia, right foot pain/swelling for 3 days EXAM: CT ANGIOGRAPHY CHEST WITH CONTRAST TECHNIQUE: Multidetector CT imaging of the abdomen and pelvis with run-off to both lower extremities was performed using the standard protocol during bolus administration of intravenous contrast. Multiplanar CT image reconstructions and MIPs were obtained to evaluate the vascular anatomy. RADIATION DOSE REDUCTION: This exam was performed according to the departmental dose-optimization program which includes automated exposure control, adjustment of the mA and/or kV according to patient size and/or use of iterative reconstruction technique. CONTRAST:  OMNIPAQUE  IOHEXOL  350  MG/ML SOLN COMPARISON:  May 04, 2023 FINDINGS: VASCULAR Aorta: No aneurysm or aortic dissection. No acute thrombus. Diffuse calcified atherosclerosis throughout. Celiac: Patent without aneurysm or dissection.No hemodynamically significant stenosis. SMA: Patent without aneurysm or dissection.No hemodynamically significant stenosis. Renals: Patent without aneurysm or dissection.Diffusely calcified with moderate stenoses of both renal ostia from calcified plaques. IMA: Patent without aneurysm or dissection.Moderate to severe stenosis of the ostium due to calcified plaque. RIGHT Lower Extremity Inflow: Common, internal and external iliac arteries are patent without aneurysm, acute thrombus, or dissection. Diffuse calcified atherosclerosis throughout without hemodynamically significant stenosis. Outflow: The common and proximal superficial femoral arteries are patent with diffuse calcified plaque throughout. The profunda femoral artery is also patent with diffuse calcified atherosclerosis throughout. Decreased opacification of the mid and distal superficial femoral artery with thrombosis of the popliteal artery starting in the above knee segment. Runoff: Extensive calcified atherosclerosis throughout the trifurcation vessels, which are not opacified. LEFT Lower Extremity Inflow: Common, internal and external iliac arteries are patent without aneurysm, acute thrombus, or dissection. Diffuse calcified atherosclerosis throughout without hemodynamically significant stenosis. Outflow: Common, superficial, and profunda femoral are patent without acute thrombus, aneurysm, or dissection. Diffuse calcified atherosclerosis throughout the common and superficial femoral arteries without hemodynamically significant stenosis. The popliteal artery is also diffusely calcified. There is delayed perfusion of the above and behind knee popliteal artery. Runoff: Extensive calcified atherosclerosis throughout the trifurcation vessels,  which are not opacified. Veins: Not evaluated due to the phase of contrast. Review of the MIP images confirms the above findings. NON-VASCULAR Lower chest: Mild diffuse prominence of the pulmonary interstitium with intralobular septal thickening.Bibasilar atelectasis. Mild cardiomegaly with densely calcified coronary arteries. Hepatobiliary: No mass.Multiple small radiopaque gallstones. No wall thickening.No intrahepatic or extrahepatic biliary ductal dilation. Pancreas: No mass or main ductal dilation.No peripancreatic inflammation or fluid collection. Spleen: Normal size. No mass. Adrenals/Urinary Tract: No adrenal  masses. The renal cortices of both kidneys are full of cysts. No hydronephrosis or nephrolithiasis. The urinary bladder is completely decompressed. 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 Stomach/Bowel: The stomach is decompressed without focal abnormality. No small bowel wall thickening or inflammation. No small bowel obstruction. Scattered total colonic diverticulosis. Vascular/Lymphatic: No intraabdominal or pelvic lymphadenopathy. Reproductive: No prostatomegaly.No free pelvic fluid. Other: Moderate volume ascites.  No pneumoperitoneum. Musculoskeletal: No acute fracture or destructive lesion.Subcutaneous edema in both calves. Bone changes consistent with renal osteodystrophy. Multilevel degenerative disc disease of the spine. IMPRESSION: VASCULAR No abdominal aortic aneurysm or aortic dissection. Extensive aortic atherosclerosis without hemodynamically significant stenosis. Severe peripheral vascular atherosclerosis also noted in both legs. Aortic Atherosclerosis (ICD10-I70.0). Right leg: 1. Diffuse calcified atherosclerosis throughout the inflow and proximal outflow vessels. Decreased opacification of the mid and distal superficial femoral artery with thrombosis of the popliteal artery starting in the above knee segment. 2. Non opacification of the trifurcation vessels below the  knee, which may be due to non opacification or thrombosis. Left leg: 1. No acute thrombus or hemodynamically significant stenosis within the inflow or proximal outflow vessels. 2. Delayed contrast opacification of the above and behind knee popliteal artery without significant opacification of the trifurcation vessels below the knee, possibly due to slow flow. NON-VASCULAR 1. Mild prominence of the pulmonary interstitium in both lung bases with minimal intralobular septal thickening, likely representing early changes of edema. 2. Total colonic diverticulosis. No changes of acute diverticulitis. 3. Few small calcified gallstones present. 4. Moderate volume ascites. Critical Value/emergent results were called by telephone at the time of interpretation on 10/25/2023 at 3:28 pm to provider BERNARDINO FIREMAN , who verbally acknowledged these results. Electronically Signed   By: Rogelia Myers M.D.   On: 10/25/2023 15:37   DG Foot 2 Views Right Result Date: 10/25/2023 CLINICAL DATA:  Foot pain, second digit EXAM: RIGHT FOOT - 2 VIEW COMPARISON:  None Available. FINDINGS: There is no evidence of fracture or dislocation. Sclerotic lesion in distal end of the second metatarsal bone likely bone island, similar to prior exam from 2021. There is no evidence of arthropathy or other focal bone abnormality. Small calcaneal spur. Diffuse vascular calcifications. Soft tissues are otherwise unremarkable. IMPRESSION: No acute findings. Electronically Signed   By: Megan  Zare M.D.   On: 10/25/2023 12:05     Procedures   Medications Ordered in the ED  0.9 %  sodium chloride  infusion ( Intravenous New Bag/Given 10/25/23 1604)  piperacillin -tazobactam (ZOSYN ) IVPB 3.375 g (3.375 g Intravenous New Bag/Given 10/25/23 1548)  vancomycin  (VANCOREADY) IVPB 2000 mg/400 mL (2,000 mg Intravenous New Bag/Given 10/25/23 1605)  heparin  ADULT infusion 100 units/mL (25000 units/250mL) (has no administration in time range)  oxyCODONE -acetaminophen   (PERCOCET/ROXICET) 5-325 MG per tablet 1 tablet (1 tablet Oral Given 10/25/23 1053)  iohexol  (OMNIPAQUE ) 350 MG/ML injection 100 mL (100 mLs Intravenous Contrast Given 10/25/23 1326)  lactated ringers  bolus 500 mL (500 mLs Intravenous New Bag/Given 10/25/23 1548)                                    Medical Decision Making Amount and/or Complexity of Data Reviewed Labs: ordered. Radiology: ordered.  Risk Prescription drug management. Decision regarding hospitalization.   This patient presents to the ED for concern of foot and leg pain, this involves an extensive number of treatment options, and is a complaint that carries with it a high risk of  complications and morbidity.  The differential diagnosis includes PVD, cellulitis, osteomyelitis, septic arthritis, gout   Co morbidities / Chronic conditions that complicate the patient evaluation  CHF, DM, ESRD, HTN, gout, OSA, HLD, CAD   Additional history obtained:  Additional history obtained from EMR External records from outside source obtained and reviewed including N/A   Lab Tests:  I Ordered, and personally interpreted labs.  The pertinent results include: Baseline anemia, no leukocytosis, uptrending lactic acid, elevated creatinine consistent with ESRD, normal electrolytes   Imaging Studies ordered:  I ordered imaging studies including x-ray of right foot, CTA with runoff I independently visualized and interpreted imaging which showed diffuse vascular disease with concern of right popliteal artery thrombosis I agree with the radiologist interpretation   Cardiac Monitoring: / EKG:  The patient was maintained on a cardiac monitor.  I personally viewed and interpreted the cardiac monitored which showed an underlying rhythm of: Sinus rhythm   Problem List / ED Course / Critical interventions / Medication management  Patient presents for nonhealing wound in left calf as well as new onset pain and swelling in seconded of  right foot.  On arrival in the ED, vital signs notable for hypertension.  Patient is on dialysis and last session was 3 days ago.  On exam, he has a circular wound on his left calf with central eschar.  Area is tender.  Right foot second digit has erythema, swelling, and tenderness.  Erythema and tenderness are extending into forefoot.  He has tenderness of great toe as well.  I am unable to Doppler pulses in all 5 toes.  He has very faint dopplerable pulses in right PT and DP.  There is concern of limb ischemia and/or infection.  Percocet was ordered for analgesia.  Workup was initiated.  Lab work notable for uptrending lactic acid.  Imaging study showed confirmation of vasculopathy with concern of popliteal artery occlusion.  Heparin  was started.  I spoke with vascular surgeon on-call, Dr. Lanis, who will see in consult.  On reassessment, patient's pain is currently controlled.  Patient to be admitted for further management. I ordered medication including the fluid for hydration, vancomycin  and Zosyn  for concern of cellulitis and/for osteomyelitis, heparin  for limb ischemia, Percocet for analgesia Reevaluation of the patient after these medicines showed that the patient improved I have reviewed the patients home medicines and have made adjustments as needed   Consultations Obtained:  I requested consultation with the vascular surgeon, Dr. Lanis,  and discussed lab and imaging findings as well as pertinent plan - they recommend: Admission to medicine, vascular surgery will see in consult.   Social Determinants of Health:  Lives independently     Final diagnoses:  Lower limb ischemia    ED Discharge Orders     None          Melvenia Motto, MD 10/25/23 [phone]

## 2023-10-25 NOTE — Hospital Course (Addendum)
 68yo with h/o ESRD on MWF HD, failed renal transplant, HTN, HLD, PAF, chronic HFrEF, CAD, and OSA who presented on 8/11 with critical limb ischemia, failed outpatient antibiotic therapy.  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  Completed antibiotics.  His primary issue now is disposition, as he has been declined by all 5 [facility] HD centers in [place] due to prior behavior.  He may need SNF placement outside of [facility] to accommodate his HD.

## 2023-10-26 ENCOUNTER — Inpatient Hospital Stay (HOSPITAL_COMMUNITY)

## 2023-10-26 DIAGNOSIS — I70201 Unspecified atherosclerosis of native arteries of extremities, right leg: Secondary | ICD-10-CM | POA: Diagnosis not present

## 2023-10-26 DIAGNOSIS — M62262 Nontraumatic ischemic infarction of muscle, left lower leg: Secondary | ICD-10-CM | POA: Diagnosis not present

## 2023-10-26 LAB — CBC
HCT: 31.2 % — ABNORMAL LOW (ref 39.0–52.0)
Hemoglobin: 10.3 g/dL — ABNORMAL LOW (ref 13.0–17.0)
MCH: 28.4 pg (ref 26.0–34.0)
MCHC: 33 g/dL (ref 30.0–36.0)
MCV: 86 fL (ref 80.0–100.0)
Platelets: 122 K/uL — ABNORMAL LOW (ref 150–400)
RBC: 3.63 MIL/uL — ABNORMAL LOW (ref 4.22–5.81)
RDW: 16.4 % — ABNORMAL HIGH (ref 11.5–15.5)
WBC: 4.5 K/uL (ref 4.0–10.5)
nRBC: 0 % (ref 0.0–0.2)

## 2023-10-26 LAB — BASIC METABOLIC PANEL WITH GFR
Anion gap: 15 (ref 5–15)
BUN: 38 mg/dL — ABNORMAL HIGH (ref 8–23)
CO2: 25 mmol/L (ref 22–32)
Calcium: 8.5 mg/dL — ABNORMAL LOW (ref 8.9–10.3)
Chloride: 94 mmol/L — ABNORMAL LOW (ref 98–111)
Creatinine, Ser: 6.43 mg/dL — ABNORMAL HIGH (ref 0.61–1.24)
GFR, Estimated: 9 mL/min — ABNORMAL LOW (ref >60)
Glucose, Bld: 65 mg/dL — ABNORMAL LOW (ref 70–99)
Potassium: 3.7 mmol/L (ref 3.5–5.1)
Sodium: 134 mmol/L — ABNORMAL LOW (ref 135–145)

## 2023-10-26 LAB — GLUCOSE, CAPILLARY
Glucose-Capillary: 64 mg/dL — ABNORMAL LOW (ref 70–99)
Glucose-Capillary: 74 mg/dL (ref 70–99)
Glucose-Capillary: 69 mg/dL — ABNORMAL LOW (ref 70–99)
Glucose-Capillary: 85 mg/dL (ref 70–99)

## 2023-10-26 LAB — HEPARIN LEVEL (UNFRACTIONATED)
Heparin Unfractionated: 0.2 [IU]/mL — ABNORMAL LOW (ref 0.30–0.70)
Heparin Unfractionated: 0.23 [IU]/mL — ABNORMAL LOW (ref 0.30–0.70)
Heparin Unfractionated: 0.21 [IU]/mL — ABNORMAL LOW (ref 0.30–0.70)

## 2023-10-26 MED ORDER — ANTICOAGULANT SODIUM CITRATE 4% (200MG/5ML) IV SOLN: 5.0000 mL | Status: DC | PRN

## 2023-10-26 MED ORDER — DARBEPOETIN ALFA 150 MCG/0.3ML IJ SOSY
150.0000 ug | PREFILLED_SYRINGE | Freq: Once | INTRAMUSCULAR | Status: AC
  Administered 2023-10-26 (×2): 150 ug via SUBCUTANEOUS
  Filled 2023-10-26: qty 0.3

## 2023-10-26 MED ORDER — ALTEPLASE 2 MG IJ SOLR: 2.0000 mg | Freq: Once | INTRAMUSCULAR | Status: DC | PRN

## 2023-10-26 MED ORDER — HEPARIN SODIUM (PORCINE) 1000 UNIT/ML DIALYSIS: 20.0000 [IU]/kg | INTRAMUSCULAR | Status: DC | PRN

## 2023-10-26 MED ORDER — PENTAFLUOROPROP-TETRAFLUOROETH EX AERO
1.0000 | INHALATION_SPRAY | CUTANEOUS | Status: DC | PRN
Start: 2023-10-26 — End: 2023-10-27

## 2023-10-26 MED ORDER — HEPARIN BOLUS VIA INFUSION
2000.0000 [IU] | Freq: Once | INTRAVENOUS | Status: AC
  Administered 2023-10-26 (×2): 2000 [IU] via INTRAVENOUS
  Filled 2023-10-26: qty 2000

## 2023-10-26 MED ORDER — LIDOCAINE-PRILOCAINE 2.5-2.5 % EX CREA
1.0000 | TOPICAL_CREAM | CUTANEOUS | Status: DC | PRN
Start: 2023-10-26 — End: 2023-10-27

## 2023-10-26 MED ORDER — NEPRO/CARBSTEADY PO LIQD: 237.0000 mL | ORAL | Status: DC | PRN

## 2023-10-26 MED ORDER — HEPARIN SODIUM (PORCINE) 1000 UNIT/ML DIALYSIS: 1000.0000 [IU] | INTRAMUSCULAR | Status: DC | PRN

## 2023-10-26 MED ORDER — LIDOCAINE HCL (PF) 1 % IJ SOLN: 5.0000 mL | INTRAMUSCULAR | Status: DC | PRN

## 2023-10-26 MED ORDER — VANCOMYCIN HCL IN DEXTROSE 1-5 GM/200ML-% IV SOLN
1000.0000 mg | Freq: Once | INTRAVENOUS | Status: AC
  Administered 2023-10-26 (×2): 1000 mg via INTRAVENOUS
  Filled 2023-10-26: qty 200

## 2023-10-26 NOTE — Progress Notes (Signed)
 PHARMACY - ANTICOAGULATION  Pharmacy Consult for heparin  Indication: Limb Ischemia   No Known Allergies  Patient Measurements: Height: 6' 1 (185.4 cm) Weight: 81.4 kg (179 lb 8 oz) IBW/kg (Calculated) : 79.9 HEPARIN  DW (KG): 84.8  Vital Signs: Temp: 98 F (36.7 C) (08/12 1621) Temp Source: Oral (08/12 1621) BP: 138/95 (08/12 1621) Pulse Rate: 55 (08/12 1621)  Labs: Recent Labs    10/25/23 1123 10/26/23 0338 10/26/23 1235 10/26/23 2227  HGB 10.9* 10.3*  --   --   HCT 33.3* 31.2*  --   --   PLT 145* 122*  --   --   LABPROT 16.8*  --   --   --   INR 1.3*  --   --   --   HEPARINUNFRC  --  0.20* 0.23* 0.21*  CREATININE 9.98* 6.43*  --   --     Estimated Creatinine Clearance: 12.4 mL/min (A) (by C-G formula based on SCr of 6.43 mg/dL (H)).  Assessment: 68 yo male with RLE  ischemia for heparin .  Heparin  level is subtherapeutic on 1700 units/hr. Per RN, no issues with infusion running continuously or signs/symptoms of bleeding. Noted plans for angiogram 8/13.   Goal of Therapy:  Heparin  level 0.3-0.7 units/ml Monitor platelets by anticoagulation protocol: Yes   Plan:  Increase heparin  to 1900 units/hr Check heparin  level in 8 hours. Daily heparin  level and CBC  Lynwood Poplar, PharmD, BCPS Clinical Pharmacist 10/26/2023 11:30 PM

## 2023-10-26 NOTE — Progress Notes (Signed)
 PHARMACY - ANTICOAGULATION  Pharmacy Consult for heparin  Indication: Limb Ischemia Brief A/P: Heparin  level subtherapeutic Increase Heparin  rate  No Known Allergies  Patient Measurements: Height: 6' 1 (185.4 cm) Weight: 84.8 kg (187 lb) IBW/kg (Calculated) : 79.9 HEPARIN  DW (KG): 84.8  Vital Signs: Temp: 97.6 F (36.4 C) (08/12 0129) Temp Source: Oral (08/12 0129) BP: 163/95 (08/12 0129) Pulse Rate: 57 (08/12 0129)  Labs: Recent Labs    10/25/23 1123 10/26/23 0338  HGB 10.9* 10.3*  HCT 33.3* 31.2*  PLT 145* 122*  LABPROT 16.8*  --   INR 1.3*  --   HEPARINUNFRC  --  0.20*  CREATININE 9.98* 6.43*    Estimated Creatinine Clearance: 12.4 mL/min (A) (by C-G formula based on SCr of 6.43 mg/dL (H)).  Assessment: 68 yo male with RLE  ischemia for heparin    Goal of Therapy:  Heparin  level 0.3-0.7 units/ml Monitor platelets by anticoagulation protocol: Yes   Plan:  Heparin  2000 units IV bolus, then increase heparin   1500 units/hr Check heparin  level in 8 hours.  Cathlyn Arrant, PharmD, BCPS  10/26/2023 4:44 AM

## 2023-10-26 NOTE — Progress Notes (Signed)
 Hypoglycemic Event  CBG: 69  Treatment: 8 oz juice/soda  Symptoms: None  Follow-up CBG: Time:1442 CBG Result:84  Possible Reasons for Event: Inadequate meal intake and Unknown    Jacob Daniel Cleverly

## 2023-10-26 NOTE — Consult Note (Signed)
 [facility] Renal Consultation Note    Indication for Consultation:  Management of ESRD/hemodialysis; anemia, hypertension/volume and secondary hyperparathyroidism  HPI: Jacob Daniel is a 68 y.o. male with a PMH significant for PAF, HTN, HLD, HFrEF, pulmonary HTN, CAD, OSA, and ESRD on HD MWF who presented to [facility] ED on 10/25/23 with a 3 day history of right foot pain/swelling.  He also had a spider bite on his left calf about 3 weeks ago.  He was treated with antibiotics but did not improve.  In the ED, Temp 97.8, Bp 163/101, HR 70, RR 22, SpO2 100%.  Labs were notable for WBC 5, Hgb 10.9, plt 145, K 5, BUN 67, Cr 9.9, alb 2.8, lactate 3.8.  Exam revealed painful red first 3 toes on the right and large ulceration of the left calf.  Decreased doppler flow.  He is being admitted for vascular surgery evaluation and we were consulted to provide dialysis during his hospitalization.  He did have HD early this morning.  Past Medical History:  Diagnosis Date   Arthritis    CHF (congestive heart failure) (HCC)    Diabetes mellitus without complication (HCC)    ESRD on hemodialysis (HCC)    Hypertension    Past Surgical History:  Procedure Laterality Date   IR PARACENTESIS  05/06/2023   KIDNEY TRANSPLANT  2010   RIGHT/LEFT HEART CATH AND CORONARY ANGIOGRAPHY N/A 04/08/2023   Procedure: RIGHT/LEFT HEART CATH AND CORONARY ANGIOGRAPHY;  Surgeon: Ladona Heinz, MD;  Location: [facility] INVASIVE CV LAB;  Service: Cardiovascular;  Laterality: N/A;   SPINE SURGERY     Family History:   History reviewed. No pertinent family history. Social History:  reports that he has never smoked. He has never used smokeless tobacco. He reports that he does not currently use alcohol after a past usage of about 1.0 standard drink of alcohol per week. He reports current drug use. Drug: Marijuana. No Known Allergies Prior to Admission medications   Medication Sig Start Date End Date Taking? Authorizing Provider   albuterol  (ACCUNEB ) 0.63 MG/3ML nebulizer solution Take 1 ampule by nebulization every 6 (six) hours as needed for wheezing.   Yes [provider]  albuterol  (VENTOLIN  HFA) 108 (90 Base) MCG/ACT inhaler Inhale 2 puffs into the lungs every 6 (six) hours as needed for wheezing.   Yes [provider]  apixaban  (ELIQUIS ) 5 MG TABS tablet Take 1 tablet (5 mg total) by mouth 2 (two) times daily. 04/10/23  Yes Jillian Buttery, MD  ipratropium-albuterol  (DUONEB) 0.5-2.5 (3) MG/3ML SOLN Take 3 mLs by nebulization every 6 (six) hours as needed (for shortness of breath and wheezing). 02/16/23  Yes [provider]  metoprolol  succinate (TOPROL -XL) 25 MG 24 hr tablet Take 3 tablets (75 mg total) by mouth daily. 04/11/23  Yes Adhikari, Amrit, MD  triamcinolone ointment (KENALOG) 0.1 % Apply 1 Application topically 3 (three) times daily. 10/11/23  Yes [provider]  atorvastatin  (LIPITOR) 10 MG tablet Take 1 tablet (10 mg total) by mouth daily. Patient not taking: Reported on 10/26/2023 04/10/23 01/13/27  Jillian Buttery, MD  calcium  acetate (PHOSLO ) 667 MG capsule Take 3 capsules (2,001 mg total) by mouth 3 (three) times daily with meals. Patient not taking: Reported on 10/26/2023 04/10/23   Adhikari, Amrit, MD  cetirizine (ZYRTEC) 10 MG tablet Take by mouth. Patient not taking: Reported on 10/26/2023 10/11/23   [provider]  clindamycin (CLEOCIN) 300 MG capsule Take 300 mg by mouth 2 (two) times daily.  Patient not taking: Reported on 10/26/2023 10/11/23   [provider]  doxycycline  (VIBRAMYCIN ) 100 MG capsule Take 1 capsule (100 mg total) by mouth 2 (two) times daily. Patient not taking: Reported on 10/26/2023 09/28/23   Sofia, Leslie K, PA-C  famotidine (PEPCID) 40 MG tablet Take 40 mg by mouth every morning. Patient not taking: Reported on 10/26/2023 10/12/23   [provider]  irbesartan  (AVAPRO ) 300 MG tablet Take 1 tablet (300 mg total) by mouth  daily. Patient not taking: Reported on 10/26/2023 04/11/23   Jillian Buttery, MD  magnesium  oxide (MAG-OX) 400 MG tablet Take 1 tablet (400 mg total) by mouth daily. Patient not taking: Reported on 10/26/2023 04/10/23   Adhikari, Amrit, MD  sildenafil (REVATIO) 20 MG tablet Take 20-60 mg by mouth daily as needed (erectile dysfunction). Patient not taking: Reported on 10/26/2023 06/03/20   [provider]  sodium bicarbonate  650 MG tablet Take 1 tablet (650 mg total) by mouth 2 (two) times daily. Patient not taking: Reported on 10/26/2023 04/10/23   Jillian Buttery, MD  tacrolimus  (PROGRAF ) 0.5 MG capsule Take 2 capsules (1 mg total) by mouth daily. Patient not taking: Reported on 10/26/2023 04/10/23   Jillian Buttery, MD   Current Facility-Administered Medications  Medication Dose Route Frequency Provider Last Rate Last Admin   acetaminophen  (TYLENOL ) tablet 650 mg  650 mg Oral Q6H PRN Christobal Guadalajara, MD       Or   acetaminophen  (TYLENOL ) suppository 650 mg  650 mg Rectal Q6H PRN Kc, Ramesh, MD       albuterol  (PROVENTIL ) (2.5 MG/3ML) 0.083% nebulizer solution 2.5 mg  2.5 mg Inhalation Q6H PRN Kc, Ramesh, MD       alteplase  (CATHFLO ACTIVASE ) injection 2 mg  2 mg Intracatheter Once PRN Rayburn Pac, MD       anticoagulant sodium citrate  solution 5 mL  5 mL Intracatheter PRN Rayburn Pac, MD       atorvastatin  (LIPITOR) tablet 10 mg  10 mg Oral Daily Kc, Ramesh, MD   10 mg at 10/25/23 1726   bisacodyl  (DULCOLAX) EC tablet 5 mg  5 mg Oral Daily PRN Kc, Guadalajara, MD       calcium  acetate (PHOSLO ) capsule 2,001 mg  2,001 mg Oral TID WC Kc, Ramesh, MD   2,001 mg at 10/25/23 1727   Chlorhexidine  Gluconate Cloth 2 % PADS 6 each  6 each Topical Q0600 Rayburn Pac, MD   6 each at 10/26/23 0538   feeding supplement (NEPRO CARB STEADY) liquid 237 mL  237 mL Oral PRN Rayburn Pac, MD       ferrous sulfate  tablet 325 mg  325 mg Oral Q breakfast Kc, Ramesh, MD       heparin  ADULT infusion  100 units/mL (25000 units/250mL)  1,500 Units/hr Intravenous Continuous Kc, Ramesh, MD 15 mL/hr at 10/26/23 0901 1,500 Units/hr at 10/26/23 0901   heparin  injection 1,000 Units  1,000 Units Intracatheter PRN Rayburn Pac, MD       heparin  injection 1,700 Units  20 Units/kg Dialysis PRN Rayburn Pac, MD       ipratropium-albuterol  (DUONEB) 0.5-2.5 (3) MG/3ML nebulizer solution 3 mL  3 mL Nebulization Q6H PRN Kc, Ramesh, MD       irbesartan  (AVAPRO ) tablet 300 mg  300 mg Oral Daily Kc, Ramesh, MD   300 mg at 10/25/23 1726   lidocaine  (PF) (XYLOCAINE ) 1 % injection 5 mL  5 mL Intradermal PRN Rayburn Pac, MD  lidocaine -prilocaine  (EMLA ) cream 1 Application  1 Application Topical PRN Rayburn Pac, MD       metoprolol  succinate (TOPROL -XL) 24 hr tablet 75 mg  75 mg Oral Daily Kc, Ramesh, MD   75 mg at 10/25/23 1726   ondansetron  (ZOFRAN ) tablet 4 mg  4 mg Oral Q6H PRN Kc, Mennie, MD       Or   ondansetron  (ZOFRAN ) injection 4 mg  4 mg Intravenous Q6H PRN Kc, Ramesh, MD       oxyCODONE  (Oxy IR/ROXICODONE ) immediate release tablet 5 mg  5 mg Oral Q4H PRN Kc, Ramesh, MD   5 mg at 10/26/23 0130   pentafluoroprop-tetrafluoroeth (GEBAUERS) aerosol 1 Application  1 Application Topical PRN Rayburn Pac, MD       piperacillin -tazobactam (ZOSYN ) IVPB 2.25 g  2.25 g Intravenous Q8H Gaines Carrier, RPH   Stopped at 10/26/23 0206   sodium bicarbonate  tablet 650 mg  650 mg Oral BID Kc, Mennie, MD   650 mg at 10/26/23 0130   [START ON 10/27/2023] vancomycin  (VANCOCIN ) IVPB 1000 mg/200 mL premix  1,000 mg Intravenous Q M,W,F-HD Gaines Carrier, [facility]       Labs: Basic Metabolic Panel: Recent Labs  Lab 10/25/23 1123 10/26/23 0338  NA 135 134*  K 5.0 3.7  CL 96* 94*  CO2 23 25  GLUCOSE 109* 65*  BUN 67* 38*  CREATININE 9.98* 6.43*  CALCIUM  9.5 8.5*   Liver Function Tests: Recent Labs  Lab 10/25/23 1123  AST 22  ALT 16  ALKPHOS 76  BILITOT 1.6*  PROT 8.4*  ALBUMIN   2.8*   No results for input(s): LIPASE, AMYLASE in the last 168 hours. No results for input(s): AMMONIA in the last 168 hours. CBC: Recent Labs  Lab 10/25/23 1123 10/26/23 0338  WBC 5.0 4.5  NEUTROABS 3.5  --   HGB 10.9* 10.3*  HCT 33.3* 31.2*  MCV 86.9 86.0  PLT 145* 122*   Cardiac Enzymes: No results for input(s): CKTOTAL, CKMB, CKMBINDEX, TROPONINI in the last 168 hours. CBG: Recent Labs  Lab 10/26/23 0518  GLUCAP 64*   Iron Studies: No results for input(s): IRON, TIBC, TRANSFERRIN, FERRITIN in the last 72 hours. Studies/Results: CT Angio Aortobifemoral W and/or Wo Contrast Result Date: 10/25/2023 CLINICAL DATA:  Claudication or leg ischemia, right foot pain/swelling for 3 days EXAM: CT ANGIOGRAPHY CHEST WITH CONTRAST TECHNIQUE: Multidetector CT imaging of the abdomen and pelvis with run-off to both lower extremities was performed using the standard protocol during bolus administration of intravenous contrast. Multiplanar CT image reconstructions and MIPs were obtained to evaluate the vascular anatomy. RADIATION DOSE REDUCTION: This exam was performed according to the departmental dose-optimization program which includes automated exposure control, adjustment of the mA and/or kV according to patient size and/or use of iterative reconstruction technique. CONTRAST:  OMNIPAQUE  IOHEXOL  350 MG/ML SOLN COMPARISON:  May 04, 2023 FINDINGS: VASCULAR Aorta: No aneurysm or aortic dissection. No acute thrombus. Diffuse calcified atherosclerosis throughout. Celiac: Patent without aneurysm or dissection.No hemodynamically significant stenosis. SMA: Patent without aneurysm or dissection.No hemodynamically significant stenosis. Renals: Patent without aneurysm or dissection.Diffusely calcified with moderate stenoses of both renal ostia from calcified plaques. IMA: Patent without aneurysm or dissection.Moderate to severe stenosis of the ostium due to calcified plaque.  RIGHT Lower Extremity Inflow: Common, internal and external iliac arteries are patent without aneurysm, acute thrombus, or dissection. Diffuse calcified atherosclerosis throughout without hemodynamically significant stenosis. Outflow: The common and proximal superficial femoral arteries are patent with diffuse calcified plaque  throughout. The profunda femoral artery is also patent with diffuse calcified atherosclerosis throughout. Decreased opacification of the mid and distal superficial femoral artery with thrombosis of the popliteal artery starting in the above knee segment. Runoff: Extensive calcified atherosclerosis throughout the trifurcation vessels, which are not opacified. LEFT Lower Extremity Inflow: Common, internal and external iliac arteries are patent without aneurysm, acute thrombus, or dissection. Diffuse calcified atherosclerosis throughout without hemodynamically significant stenosis. Outflow: Common, superficial, and profunda femoral are patent without acute thrombus, aneurysm, or dissection. Diffuse calcified atherosclerosis throughout the common and superficial femoral arteries without hemodynamically significant stenosis. The popliteal artery is also diffusely calcified. There is delayed perfusion of the above and behind knee popliteal artery. Runoff: Extensive calcified atherosclerosis throughout the trifurcation vessels, which are not opacified. Veins: Not evaluated due to the phase of contrast. Review of the MIP images confirms the above findings. NON-VASCULAR Lower chest: Mild diffuse prominence of the pulmonary interstitium with intralobular septal thickening.Bibasilar atelectasis. Mild cardiomegaly with densely calcified coronary arteries. Hepatobiliary: No mass.Multiple small radiopaque gallstones. No wall thickening.No intrahepatic or extrahepatic biliary ductal dilation. Pancreas: No mass or main ductal dilation.No peripancreatic inflammation or fluid collection. Spleen: Normal size. No  mass. Adrenals/Urinary Tract: No adrenal masses. The renal cortices of both kidneys are full of cysts. No hydronephrosis or nephrolithiasis. The urinary bladder is completely decompressed. 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 Stomach/Bowel: The stomach is decompressed without focal abnormality. No small bowel wall thickening or inflammation. No small bowel obstruction. Scattered total colonic diverticulosis. Vascular/Lymphatic: No intraabdominal or pelvic lymphadenopathy. Reproductive: No prostatomegaly.No free pelvic fluid. Other: Moderate volume ascites.  No pneumoperitoneum. Musculoskeletal: No acute fracture or destructive lesion.Subcutaneous edema in both calves. Bone changes consistent with renal osteodystrophy. Multilevel degenerative disc disease of the spine. IMPRESSION: VASCULAR No abdominal aortic aneurysm or aortic dissection. Extensive aortic atherosclerosis without hemodynamically significant stenosis. Severe peripheral vascular atherosclerosis also noted in both legs. Aortic Atherosclerosis (ICD10-I70.0). Right leg: 1. Diffuse calcified atherosclerosis throughout the inflow and proximal outflow vessels. Decreased opacification of the mid and distal superficial femoral artery with thrombosis of the popliteal artery starting in the above knee segment. 2. Non opacification of the trifurcation vessels below the knee, which may be due to non opacification or thrombosis. Left leg: 1. No acute thrombus or hemodynamically significant stenosis within the inflow or proximal outflow vessels. 2. Delayed contrast opacification of the above and behind knee popliteal artery without significant opacification of the trifurcation vessels below the knee, possibly due to slow flow. NON-VASCULAR 1. Mild prominence of the pulmonary interstitium in both lung bases with minimal intralobular septal thickening, likely representing early changes of edema. 2. Total colonic diverticulosis. No changes of acute  diverticulitis. 3. Few small calcified gallstones present. 4. Moderate volume ascites. Critical Value/emergent results were called by telephone at the time of interpretation on 10/25/2023 at 3:28 pm to provider BERNARDINO FIREMAN , who verbally acknowledged these results. Electronically Signed   By: Rogelia Myers M.D.   On: 10/25/2023 15:37   DG Foot 2 Views Right Result Date: 10/25/2023 CLINICAL DATA:  Foot pain, second digit EXAM: RIGHT FOOT - 2 VIEW COMPARISON:  None Available. FINDINGS: There is no evidence of fracture or dislocation. Sclerotic lesion in distal end of the second metatarsal bone likely bone island, similar to prior exam from 2021. There is no evidence of arthropathy or other focal bone abnormality. Small calcaneal spur. Diffuse vascular calcifications. Soft tissues are otherwise unremarkable. IMPRESSION: No acute findings. Electronically Signed   By: Megan  Terri M.D.   On: 10/25/2023 12:05    ROS: Pertinent items are noted in HPI. Physical Exam: Vitals:   10/26/23 0129 10/26/23 0500 10/26/23 0645 10/26/23 0839  BP: (!) 163/95 (!) 155/95  (!) 155/89  Pulse: (!) 57 (!) 51  (!) 54  Resp: 19 14  12   Temp: 97.6 F (36.4 C) 98 F (36.7 C)  97.8 F (36.6 C)  TempSrc: Oral Oral  Oral  SpO2: 97% 95%  97%  Weight:   81.4 kg   Height:          Weight change:   Intake/Output Summary (Last 24 hours) at 10/26/2023 0906 Last data filed at 10/26/2023 0514 Gross per 24 hour  Intake 2057.55 ml  Output 2500 ml  Net -442.45 ml   BP (!) 155/89 (BP Location: Right Arm)   Pulse (!) 54   Temp 97.8 F (36.6 C) (Oral)   Resp 12   Ht 6' 1 (1.854 m)   Wt 81.4 kg   SpO2 97%   BMI 23.68 kg/m  General appearance: alert, cooperative, and no distress Head: Normocephalic, without obvious abnormality, atraumatic Resp: clear to auscultation bilaterally Cardio: bradycardic, no rub GI: soft, non-tender; bowel sounds normal; no masses,  no organomegaly Extremities: No edema, redness of first 3  toes on right foot, cold and painful to touch, ulcerated lesion of left calf with eschar Dialysis Access: LUE AVF +T/B  Dialysis Orders: Center: [facility]  on MWF . EDW 84kg HD Bath 2K/2.5Ca  Time 4:00 Heparin  1000 bolus then 600 units/hr. Access LUE AVF BFR 400 DFR 600    Calcitriol  1.75 mcg po MWF Cinacalcet  30 mg po MWF Venofer 50 mg IV  Micera 120 mcg IV q2weeks (last dose 10/11/23)  Assessment/Plan:  Right limb ischemia - thrombosis of the popliteal artery above the knee noted.  To have angiogram per [facility].  Currently on heparin  drip Left lower extremity cellulitis - started on IV vanco and zosyn .    ESRD -  continue with HD on MWF schedule  Hypertension/volume  - stable, no evidence of volume overload.  He is below his EDW.  Anemia  - stable, due to ESA this week  Metabolic bone disease -  continue with home meds  Nutrition -  renal diet  Fairy RONAL Sellar, MD [facility], [facility] 10/26/2023, 9:06 AM

## 2023-10-26 NOTE — Progress Notes (Signed)
  Progress Note    10/26/2023 7:42 AM * No surgery found *  Subjective:  says that his toes on the right and his wound on the left leg is still hurting him    Vitals:   10/26/23 0129 10/26/23 0500  BP: (!) 163/95 (!) 155/95  Pulse: (!) 57 (!) 51  Resp: 19 14  Temp: 97.6 F (36.4 C) 98 F (36.7 C)  SpO2: 97% 95%    Physical Exam: General:  laying in bed, A&Ox3 Cardiac:  regular Lungs:  nonlabored Extremities:  wounds on the left posterior calf and right toes unchanged   CBC    Component Value Date/Time   WBC 4.5 10/26/2023 0338   RBC 3.63 (L) 10/26/2023 0338   HGB 10.3 (L) 10/26/2023 0338   HCT 31.2 (L) 10/26/2023 0338   PLT 122 (L) 10/26/2023 0338   MCV 86.0 10/26/2023 0338   MCH 28.4 10/26/2023 0338   MCHC 33.0 10/26/2023 0338   RDW 16.4 (H) 10/26/2023 0338   LYMPHSABS 0.7 10/25/2023 1123   MONOABS 0.5 10/25/2023 1123   EOSABS 0.3 10/25/2023 1123   BASOSABS 0.1 10/25/2023 1123    BMET    Component Value Date/Time   NA 134 (L) 10/26/2023 0338   K 3.7 10/26/2023 0338   CL 94 (L) 10/26/2023 0338   CO2 25 10/26/2023 0338   GLUCOSE 65 (L) 10/26/2023 0338   BUN 38 (H) 10/26/2023 0338   CREATININE 6.43 (H) 10/26/2023 0338   CALCIUM  8.5 (L) 10/26/2023 0338   GFRNONAA 9 (L) 10/26/2023 0338    INR    Component Value Date/Time   INR 1.3 (H) 10/25/2023 1123     Intake/Output Summary (Last 24 hours) at 10/26/2023 0742 Last data filed at 10/26/2023 0514 Gross per 24 hour  Intake 2057.55 ml  Output 2500 ml  Net -442.45 ml      Assessment/Plan:  68 y.o. male with tissue loss of BLE   -He is doing fairly well this morning. He says his toes on the right are still hurting but no more than normal -He has intact right DP/Peroneal and left DP/PT/peroneal doppler signals -Unchanged necrotic ulcer to the posterior left calf and small ulcerations to the toes on the right -Plans are for bilateral lower extremity angiogram tomorrow with possible intervention  for CLI with tissue loss -He remains agreeable for angiogram. Will place consent order and make NPO at midnight. Heparin  gtt can be held on call to [facility] lab   Ahmed Holster, PA-C [facility] [phone] 10/26/2023 7:42 AM

## 2023-10-26 NOTE — Progress Notes (Signed)
 Pt receives out-pt HD at [facility] on MWF 6:00 am chair time. Will assist as needed.   Randine Mungo Dialysis Navigator [phone]

## 2023-10-26 NOTE — Progress Notes (Signed)
   10/26/23 0030  Vitals  BP (!) 161/91  MAP (mmHg) 114  BP Location Right Arm  BP Method Automatic  Patient Position (if appropriate) Lying  Pulse Rate (!) 57  Pulse Rate Source Monitor  ECG Heart Rate (!) 59  Resp (!) 22  Oxygen Therapy  SpO2 100 %  During Treatment Monitoring  Blood Flow Rate (mL/min) 0 mL/min  Arterial Pressure (mmHg) -20.4 mmHg  Venous Pressure (mmHg) 133.32 mmHg  TMP (mmHg) 10.1 mmHg  Ultrafiltration Rate (mL/min) 1042 mL/min  Dialysate Flow Rate (mL/min) 299 ml/min  Dialysate Potassium Concentration 2  Dialysate Calcium  Concentration 2.5  Duration of HD Treatment -hour(s) 3.5 hour(s)  Cumulative Fluid Removed (mL) per Treatment  2500.24  HD Safety Checks Performed Yes  Intra-Hemodialysis Comments Tx completed  Post Treatment  Dialyzer Clearance Lightly streaked  Liters Processed 75.8  Fluid Removed (mL) 2500 mL  Tolerated HD Treatment Yes  AVG/AVF Arterial Site Held (minutes) 10 minutes  AVG/AVF Venous Site Held (minutes) 10 minutes  Fistula / Graft Left Upper arm  Placement Date/Time: 04/07/23 1900   Orientation: Left  Access Location: Upper arm  Site Condition No complications  Fistula / Graft Assessment Thrill;Bruit;Present  Status Deaccessed  Needle Size 15  Drainage Description None

## 2023-10-26 NOTE — Progress Notes (Signed)
 PROGRESS NOTE Jacob Daniel  FMW:[id number] DOB: [birth year] DOA: 10/25/2023 PCP: Pcp, No  Brief Narrative/Hospital Course: 68 year old man with multiple medical comorbidities including ESRD on HD MWF, failed renal transplant, HTN HLD PAF, chronic systolic CHF/pulmonary hypertension/heart failure, CAD, OSA metabolic bone disease presented to the ED with complaint of right foot pain and swelling x 3 days.  About 3 weeks ago was bit by a spider on his left calf seen by PCP, was given 7 days of doxycycline  which he completed without improvement, initially about 2 cm dark area on the posterior left lower leg, he is having pain on rt grt toes for 3 days and swelling pain on rt foot as well and has been getting worse with swelling and pain. Patient otherwise denies any nausea, vomiting, chest pain, shortness of breath, fever, chills, headache, focal weakness, numbness tingling, speech difficulties   In the ED vitals stable labs potassium 5.0 BUN 67 creatinine 9.9 total bili 1.6 albumin  2.8 lactic acid 1.8> 3.8, labs showed mild anemia and thrombocytopenia.  Blood culture obtained. Initially unable to Doppler pulses in all 5 toes very faint dopplerable pulses in the right PT and DP concern for limb ischemia, vascular surgery was consulted, heparin  was given imaging CT angio runoff/x-ray foot-showed confirmation of vasculopathy with concern for popliteal artery occlusion. Patient was placed on vancomycin , Zosyn  and admission was requested for further management.  Subjective: Seen and examined today Resting comfortably. Overnight afebrile BP 150s, on room air. Labs blood sugar was 64 this morning otherwise more or less the same.  Assessment and plan:  LLE cellulitis, non healing wound on left calf -believed to be spider bite   RLE rest pain with thrombosis of the popliteal artery from above the knee, nonopacification of the trifurcation vessels below the knee, with small toe ulceration-concerning for  critical limb ischemia with tissue loss: Patient underwent initial CT perfusion scan in the ED showing thrombosis see report.  Vascular consulted and placed on heparin  drip for critical limb ischemia with tissue loss.  Also started on empiric antibiotics for possible cellulitis. Plan is for lower extremity angiogram to improve distal perfusion for wound healing on Wednesday ABI is pending Continue IV vancomycin  and Zosyn , heparin  drip, pain management   Lactic acidosis: Suspect due to patient's leg ischemia versus infection. Resolved   ESRD on HD MWF Failed renal transplant-not taking tacrolimus  anymore Anemia of renal disease Metabolic bone disease: Nephrology has been consulted underwent HD overnight. Cont home PhosLo , ferrous sulfate  Monitor hemoglobin, electrolytes   Hypoalbuminemia Mild hypoglycemia Elevated TB: Augment diet monitor labs.  Ask nursing to recheck a.m. blood glucose having difficulty stick for cbg, but he did eat this morning   Recent Labs  Lab 10/26/23 0518  GLUCAP 64*     HTN: BP fairly controlled continue metoprolol  and irbesartan    HLD: Continue Lipitor   PAF: PTA on Eliquis  currently on heparin    Chronic systolic CHF/pulmonary hypertension/heart failure Euvolemic, continue to address fluid status volume with dialysis   CAD No chest pain.   OSA Did not tolerate so not using for few years  Mobility: Independent  DVT prophylaxis: Heparin  drip Code Status:   Code Status: Full Code Family Communication: plan of care discussed with patient at bedside. Patient status is: Remains hospitalized because of severity of illness Level of care: Progressive   Dispo: The patient is from: home            Anticipated disposition: TBD Objective: Vitals last 24 hrs: Vitals:  10/26/23 0129 10/26/23 0500 10/26/23 0645 10/26/23 0839  BP: (!) 163/95 (!) 155/95  (!) 155/89  Pulse: (!) 57 (!) 51  (!) 54  Resp: 19 14  12   Temp: 97.6 F (36.4 C) 98 F (36.7  C)  97.8 F (36.6 C)  TempSrc: Oral Oral  Oral  SpO2: 97% 95%  97%  Weight:   81.4 kg   Height:        Physical Examination: General exam: alert awake, oriented, older than stated age HEENT:Oral mucosa moist, Ear/Nose WNL grossly Respiratory system: Bilaterally clear BS,no use of accessory muscle Cardiovascular system: S1 & S2 +, No JVD. Gastrointestinal system: Abdomen soft,NT,ND, BS+ Nervous System: Alert, awake, moving all extremities,and following commands. Extremities:  Left calf w/ wound blackish, mild erythematous bilateral foot, tissue loss on the right lower extremity, bilateral feet are cold Skin: No rashes,no icterus. MSK: Normal muscle bulk,tone, power   Medications reviewed:  Scheduled Meds:  atorvastatin   10 mg Oral Daily   calcium  acetate  2,001 mg Oral TID WC   Chlorhexidine  Gluconate Cloth  6 each Topical Q0600   darbepoetin (ARANESP ) injection - DIALYSIS  150 mcg Subcutaneous Once   ferrous sulfate   325 mg Oral Q breakfast   irbesartan   300 mg Oral Daily   metoprolol  succinate  75 mg Oral Daily   sodium bicarbonate   650 mg Oral BID   Continuous Infusions:  anticoagulant sodium citrate      heparin  1,500 Units/hr (10/26/23 0901)   piperacillin -tazobactam (ZOSYN )  IV 2.25 g (10/26/23 0913)   [START ON 10/27/2023] vancomycin      Diet: Diet Order             Diet NPO time specified  Diet effective midnight           Diet renal with fluid restriction Fluid restriction: 1200 mL Fluid; Room service appropriate? Yes; Fluid consistency: Thin  Diet effective now                    Data Reviewed: I have personally reviewed following labs and imaging studies ( see epic result tab) CBC: Recent Labs  Lab 10/25/23 1123 10/26/23 0338  WBC 5.0 4.5  NEUTROABS 3.5  --   HGB 10.9* 10.3*  HCT 33.3* 31.2*  MCV 86.9 86.0  PLT 145* 122*   CMP: Recent Labs  Lab 10/25/23 1123 10/26/23 0338  NA 135 134*  K 5.0 3.7  CL 96* 94*  CO2 23 25  GLUCOSE 109* 65*   BUN 67* 38*  CREATININE 9.98* 6.43*  CALCIUM  9.5 8.5*   GFR: Estimated Creatinine Clearance: 12.4 mL/min (A) (by C-G formula based on SCr of 6.43 mg/dL (H)). Recent Labs  Lab 10/25/23 1123  AST 22  ALT 16  ALKPHOS 76  BILITOT 1.6*  PROT 8.4*  ALBUMIN  2.8*   No results for input(s): LIPASE, AMYLASE in the last 168 hours. No results for input(s): AMMONIA in the last 168 hours. Coagulation Profile:  Recent Labs  Lab 10/25/23 1123  INR 1.3*   Unresulted Labs (From admission, onward)     Start     Ordered   10/27/23 0500  Heparin  level (unfractionated)  Daily,   R     Placed in And Linked Group   10/25/23 1657   10/26/23 1200  Heparin  level (unfractionated)  Once-Timed,   TIMED        10/26/23 0448   10/26/23 0500  Basic metabolic panel with GFR  Daily,   R  10/25/23 1637   10/26/23 0500  CBC  Daily,   R      10/25/23 1637   10/26/23 0500  CBC  Daily,   R     Placed in And Linked Group   10/25/23 1657   10/26/23 0130  MRSA Next Gen by PCR, Nasal  Once,   R        10/26/23 0129   10/25/23 1808  Hepatitis B surface antibody,quantitative  (New Admission [facility] (Hepatitis B))  ONCE - URGENT,   URGENT        10/25/23 1807   Signed and Held  Renal function panel  Once,   R        Signed and Held   Signed and Held  CBC  Once,   R        Signed and Held   Signed and Held  Renal function panel  Once,   R        Signed and Held   Signed and Held  CBC  Once,   R        Signed and Held           Antimicrobials/Microbiology: Anti-infectives (From admission, onward)    Start     Dose/Rate Route Frequency Ordered Stop   10/27/23 1200  vancomycin  (VANCOCIN ) IVPB 1000 mg/200 mL premix        1,000 mg 200 mL/hr over 60 Minutes Intravenous Every M-W-F (Hemodialysis) 10/25/23 1659     10/25/23 2200  piperacillin -tazobactam (ZOSYN ) IVPB 2.25 g        2.25 g 100 mL/hr over 30 Minutes Intravenous Every 8 hours 10/25/23 1654     10/25/23 1530   piperacillin -tazobactam (ZOSYN ) IVPB 3.375 g        3.375 g 100 mL/hr over 30 Minutes Intravenous  Once 10/25/23 1515 10/25/23 1628   10/25/23 1530  vancomycin  (VANCOREADY) IVPB 2000 mg/400 mL        2,000 mg 200 mL/hr over 120 Minutes Intravenous  Once 10/25/23 1515 10/25/23 1807         Component Value Date/Time   SDES BLOOD SITE NOT SPECIFIED 10/25/2023 1156   SPECREQUEST  10/25/2023 1156    BOTTLES DRAWN AEROBIC AND ANAEROBIC Blood Culture results may not be optimal due to an inadequate volume of blood received in culture bottles   CULT  10/25/2023 1156    NO GROWTH < 24 HOURS Performed at [facility], [address], [place], [place] [address]    REPTSTATUS PENDING 10/25/2023 1156    Procedures: Procedure(s) (LRB): Lower Extremity Angiography (N/A)   Mennie LAMY, MD [facility] 10/26/2023, 9:57 AM

## 2023-10-26 NOTE — Progress Notes (Signed)
 PHARMACY - ANTICOAGULATION  Pharmacy Consult for heparin  Indication: Limb Ischemia   No Known Allergies  Patient Measurements: Height: 6' 1 (185.4 cm) Weight: 81.4 kg (179 lb 8 oz) IBW/kg (Calculated) : 79.9 HEPARIN  DW (KG): 84.8  Vital Signs: Temp: 97.9 F (36.6 C) (08/12 1127) Temp Source: Oral (08/12 1127) BP: 141/87 (08/12 1127) Pulse Rate: 53 (08/12 1127)  Labs: Recent Labs    10/25/23 1123 10/26/23 0338 10/26/23 1235  HGB 10.9* 10.3*  --   HCT 33.3* 31.2*  --   PLT 145* 122*  --   LABPROT 16.8*  --   --   INR 1.3*  --   --   HEPARINUNFRC  --  0.20* 0.23*  CREATININE 9.98* 6.43*  --     Estimated Creatinine Clearance: 12.4 mL/min (A) (by C-G formula based on SCr of 6.43 mg/dL (H)).  Assessment: 68 yo male with RLE  ischemia for heparin .  Heparin  level is subtherapeutic on 1500 units/hr.  Noted plans for angiogram 8/13.   Goal of Therapy:  Heparin  level 0.3-0.7 units/ml Monitor platelets by anticoagulation protocol: Yes   Plan:  Increase heparin  to 1700 units/hr Check heparin  level in 8 hours. Daily heparin  level and CBC  Marlane Hirschmann, Pharm.D., BCPS Clinical Pharmacist Clinical phone for 10/26/2023 from 7:30-3:00 is [phone].  **Pharmacist phone directory can be found on [email or web address] listed under [facility].  10/26/2023 2:59 PM

## 2023-10-26 NOTE — Progress Notes (Signed)
 Pharmacy Antibiotic Note  Jacob Daniel is a 68 y.o. male admitted on 10/25/2023 with LE cellulitis.  Pharmacy has been consulted for Vancomycin  and Zosyn  dosing.  ESRD-HD usually MWF, vancomycin  2g Iv x 1 given in ED 8/11.  Pt received HD early 8/12 (off schedule).  Will order supplemental Vancomycin  dose.  Plan: Vancomycin  1g IV x 1 now, then MWF with each HD Monitor HD schedule, Cx and clinical progression to narrow Vancomycin  random level as needed  Height: 6' 1 (185.4 cm) Weight: 81.4 kg (179 lb 8 oz) IBW/kg (Calculated) : 79.9  Temp (24hrs), Avg:97.8 F (36.6 C), Min:97.6 F (36.4 C), Max:98 F (36.7 C)  Recent Labs  Lab 10/25/23 1123 10/25/23 1156 10/25/23 1358 10/25/23 1557 10/25/23 1949 10/26/23 0338  WBC 5.0  --   --   --   --  4.5  CREATININE 9.98*  --   --   --   --  6.43*  LATICACIDVEN 1.8 1.5 3.8* 1.5 1.5  --     Estimated Creatinine Clearance: 12.4 mL/min (A) (by C-G formula based on SCr of 6.43 mg/dL (H)).    No Known Allergies  Jacob Daniel, Pharm.D., BCPS Clinical Pharmacist Clinical phone for 10/26/2023 from 7:30-3:00 is [phone].  **Pharmacist phone directory can be found on [email or web address] listed under [facility].  10/26/2023 2:55 PM

## 2023-10-26 NOTE — Progress Notes (Signed)
 ABI exam has been completed.   Results can be found under chart review under CV PROC. 10/26/2023 10:14 AM Riyad Keena RVT, RDMS

## 2023-10-27 ENCOUNTER — Inpatient Hospital Stay (HOSPITAL_COMMUNITY)
Admission: EM | Disposition: A | Discharge status: Skilled Nursing Facility | Payer: Self-pay | Source: Home / Self Care | Attending: Internal Medicine

## 2023-10-27 DIAGNOSIS — I70202 Unspecified atherosclerosis of native arteries of extremities, left leg: Secondary | ICD-10-CM

## 2023-10-27 DIAGNOSIS — S80862A Insect bite (nonvenomous), left lower leg, initial encounter: Secondary | ICD-10-CM

## 2023-10-27 DIAGNOSIS — I70201 Unspecified atherosclerosis of native arteries of extremities, right leg: Secondary | ICD-10-CM | POA: Diagnosis not present

## 2023-10-27 HISTORY — PX: ABDOMINAL AORTOGRAM W/LOWER EXTREMITY: CATH118223

## 2023-10-27 HISTORY — PX: LOWER EXTREMITY INTERVENTION: CATH118252

## 2023-10-27 LAB — CBC
HCT: 35.9 % — ABNORMAL LOW (ref 39.0–52.0)
HCT: 33.4 % — ABNORMAL LOW (ref 39.0–52.0)
Hemoglobin: 11.6 g/dL — ABNORMAL LOW (ref 13.0–17.0)
Hemoglobin: 10.6 g/dL — ABNORMAL LOW (ref 13.0–17.0)
MCH: 27.8 pg (ref 26.0–34.0)
MCH: 28.4 pg (ref 26.0–34.0)
MCHC: 32.3 g/dL (ref 30.0–36.0)
MCHC: 31.7 g/dL (ref 30.0–36.0)
MCV: 86.1 fL (ref 80.0–100.0)
MCV: 89.5 fL (ref 80.0–100.0)
Platelets: 159 K/uL (ref 150–400)
Platelets: 132 K/uL — ABNORMAL LOW (ref 150–400)
RBC: 4.17 MIL/uL — ABNORMAL LOW (ref 4.22–5.81)
RBC: 3.73 MIL/uL — ABNORMAL LOW (ref 4.22–5.81)
RDW: 16.9 % — ABNORMAL HIGH (ref 11.5–15.5)
RDW: 17.1 % — ABNORMAL HIGH (ref 11.5–15.5)
WBC: 5.3 K/uL (ref 4.0–10.5)
WBC: 6 K/uL (ref 4.0–10.5)
nRBC: 0 % (ref 0.0–0.2)
nRBC: 0 % (ref 0.0–0.2)

## 2023-10-27 LAB — BASIC METABOLIC PANEL WITH GFR
Anion gap: 19 — ABNORMAL HIGH (ref 5–15)
BUN: 52 mg/dL — ABNORMAL HIGH (ref 8–23)
CO2: 24 mmol/L (ref 22–32)
Calcium: 9.6 mg/dL (ref 8.9–10.3)
Chloride: 92 mmol/L — ABNORMAL LOW (ref 98–111)
Creatinine, Ser: 8.46 mg/dL — ABNORMAL HIGH (ref 0.61–1.24)
GFR, Estimated: 6 mL/min — ABNORMAL LOW (ref >60)
Glucose, Bld: 59 mg/dL — ABNORMAL LOW (ref 70–99)
Potassium: 4.5 mmol/L (ref 3.5–5.1)
Sodium: 135 mmol/L (ref 135–145)

## 2023-10-27 LAB — HEPARIN LEVEL (UNFRACTIONATED): Heparin Unfractionated: 0.35 [IU]/mL (ref 0.30–0.70)

## 2023-10-27 LAB — CREATININE, SERUM
Creatinine, Ser: 8.9 mg/dL — ABNORMAL HIGH (ref 0.61–1.24)
GFR, Estimated: 6 mL/min — ABNORMAL LOW (ref >60)

## 2023-10-27 LAB — HEPATITIS B SURFACE ANTIBODY, QUANTITATIVE: Hep B S AB Quant (Post): 2565 m[IU]/mL

## 2023-10-27 SURGERY — ABDOMINAL AORTOGRAM W/LOWER EXTREMITY
Anesthesia: LOCAL | Laterality: Right

## 2023-10-27 MED ORDER — HYDRALAZINE HCL 20 MG/ML IJ SOLN
INTRAMUSCULAR | Status: AC
  Filled 2023-10-27: qty 1

## 2023-10-27 MED ORDER — HYDRALAZINE HCL 20 MG/ML IJ SOLN
5.0000 mg | INTRAMUSCULAR | Status: AC | PRN
  Administered 2023-10-30 – 2023-11-10 (×2): 5 mg via INTRAVENOUS
  Filled 2023-10-27 (×2): qty 1

## 2023-10-27 MED ORDER — PROTAMINE SULFATE 10 MG/ML IV SOLN
INTRAVENOUS | Status: DC | PRN
  Administered 2023-10-27: 5 mg via INTRAVENOUS
  Administered 2023-10-27: 25 mg via INTRAVENOUS
  Administered 2023-10-27: 5 mg via INTRAVENOUS
  Administered 2023-10-27: 25 mg via INTRAVENOUS

## 2023-10-27 MED ORDER — HEPARIN SODIUM (PORCINE) 5000 UNIT/ML IJ SOLN
5000.0000 [IU] | Freq: Three times a day (TID) | INTRAMUSCULAR | Status: DC
  Administered 2023-10-28 – 2023-10-29 (×3): 5000 [IU] via SUBCUTANEOUS
  Filled 2023-10-27 (×4): qty 1

## 2023-10-27 MED ORDER — LABETALOL HCL 5 MG/ML IV SOLN: 10.0000 mg | INTRAVENOUS | Status: DC | PRN

## 2023-10-27 MED ORDER — ACETAMINOPHEN 325 MG PO TABS
650.0000 mg | ORAL_TABLET | ORAL | Status: DC | PRN
  Administered 2023-11-04: 650 mg via ORAL

## 2023-10-27 MED ORDER — FENTANYL CITRATE (PF) 100 MCG/2ML IJ SOLN
INTRAMUSCULAR | Status: AC
  Filled 2023-10-27: qty 2

## 2023-10-27 MED ORDER — LABETALOL HCL 5 MG/ML IV SOLN
INTRAVENOUS | Status: AC
  Filled 2023-10-27: qty 4

## 2023-10-27 MED ORDER — SODIUM CHLORIDE 0.9% FLUSH
3.0000 mL | Freq: Two times a day (BID) | INTRAVENOUS | Status: DC
  Administered 2023-10-27 – 2023-11-13 (×33): 3 mL via INTRAVENOUS

## 2023-10-27 MED ORDER — SODIUM CHLORIDE 0.9 % IV SOLN: 250.0000 mL | INTRAVENOUS | Status: AC | PRN

## 2023-10-27 MED ORDER — PROTAMINE SULFATE 10 MG/ML IV SOLN
INTRAVENOUS | Status: AC
  Filled 2023-10-27: qty 5

## 2023-10-27 MED ORDER — FENTANYL CITRATE (PF) 100 MCG/2ML IJ SOLN
INTRAMUSCULAR | Status: DC | PRN
  Administered 2023-10-27 (×2): 50 ug via INTRAVENOUS

## 2023-10-27 MED ORDER — HEPARIN SODIUM (PORCINE) 1000 UNIT/ML IJ SOLN
INTRAMUSCULAR | Status: DC | PRN
  Administered 2023-10-27 (×2): 8000 [IU] via INTRAVENOUS

## 2023-10-27 MED ORDER — IODIXANOL 320 MG/ML IV SOLN
INTRAVENOUS | Status: DC | PRN
  Administered 2023-10-27 (×2): 140 mL

## 2023-10-27 MED ORDER — MIDAZOLAM HCL 2 MG/2ML IJ SOLN
INTRAMUSCULAR | Status: AC
  Filled 2023-10-27: qty 2

## 2023-10-27 MED ORDER — LIDOCAINE HCL (PF) 1 % IJ SOLN
INTRAMUSCULAR | Status: AC
  Filled 2023-10-27: qty 30

## 2023-10-27 MED ORDER — HEPARIN (PORCINE) IN NACL 1000-0.9 UT/500ML-% IV SOLN
INTRAVENOUS | Status: DC | PRN
  Administered 2023-10-27 (×2): 1000 mL

## 2023-10-27 MED ORDER — MIDAZOLAM HCL 2 MG/2ML IJ SOLN
INTRAMUSCULAR | Status: DC | PRN
  Administered 2023-10-27 (×4): 1 mg via INTRAVENOUS

## 2023-10-27 MED ORDER — HEPARIN SODIUM (PORCINE) 1000 UNIT/ML IJ SOLN
INTRAMUSCULAR | Status: AC
  Filled 2023-10-27: qty 10

## 2023-10-27 MED ORDER — ASPIRIN 81 MG PO TBEC
81.0000 mg | DELAYED_RELEASE_TABLET | Freq: Every day | ORAL | Status: DC
  Administered 2023-10-27 – 2023-11-13 (×17): 81 mg via ORAL
  Filled 2023-10-27 (×18): qty 1

## 2023-10-27 MED ORDER — SODIUM CHLORIDE 0.9 % WEIGHT BASED INFUSION
1.0000 mL/kg/h | INTRAVENOUS | Status: AC
  Administered 2023-10-27 (×2): 1 mL/kg/h via INTRAVENOUS

## 2023-10-27 MED ORDER — ONDANSETRON HCL 4 MG/2ML IJ SOLN
INTRAMUSCULAR | Status: AC
  Filled 2023-10-27: qty 2

## 2023-10-27 MED ORDER — LABETALOL HCL 5 MG/ML IV SOLN
INTRAVENOUS | Status: DC | PRN
  Administered 2023-10-27 (×4): 10 mg via INTRAVENOUS

## 2023-10-27 MED ORDER — SODIUM CHLORIDE 0.9% FLUSH: 3.0000 mL | INTRAVENOUS | Status: DC | PRN

## 2023-10-27 SURGICAL SUPPLY — 16 items
CATH OMNI FLUSH 5F 65CM (CATHETERS) IMPLANT
CATH QUICKCROSS .018X135CM (MICROCATHETER) IMPLANT
CATH RUBICON 035 135 (CATHETERS) IMPLANT
DEVICE CLOSURE MYNXGRIP 6/7F (Vascular Products) IMPLANT
GLIDEWIRE ADV .035X260CM (WIRE) IMPLANT
KIT MICROPUNCTURE NIT STIFF (SHEATH) IMPLANT
KIT SINGLE USE MANIFOLD (KITS) IMPLANT
PACK CARDIAC CATHETERIZATION (CUSTOM PROCEDURE TRAY) IMPLANT
SET ATX-X65L (MISCELLANEOUS) IMPLANT
SHEATH CATAPULT 6FR 60 (SHEATH) IMPLANT
SHEATH PINNACLE 5F 10CM (SHEATH) IMPLANT
SHEATH PINNACLE 6F 10CM (SHEATH) IMPLANT
SHEATH PROBE COVER 6X72 (BAG) IMPLANT
WIRE BENTSON .035X145CM (WIRE) IMPLANT
WIRE G V18X300CM (WIRE) IMPLANT
WIRE SHEPHERD 4G .014 (WIRE) IMPLANT

## 2023-10-27 NOTE — Progress Notes (Signed)
 PHARMACY - ANTICOAGULATION  Pharmacy Consult for heparin  Indication: Limb Ischemia   No Known Allergies  Patient Measurements: Height: 6' 1 (185.4 cm) Weight: 81.4 kg (179 lb 8 oz) IBW/kg (Calculated) : 79.9 HEPARIN  DW (KG): 84.8  Vital Signs: Temp: 98 F (36.7 C) (08/13 0951) Temp Source: Oral (08/13 0951) BP: 164/97 (08/13 0950) Pulse Rate: 56 (08/13 0029)  Labs: Recent Labs    10/25/23 1123 10/25/23 1123 10/26/23 0338 10/26/23 1235 10/26/23 2227 10/27/23 0820  HGB 10.9*  --  10.3*  --   --  11.6*  HCT 33.3*  --  31.2*  --   --  35.9*  PLT 145*  --  122*  --   --  159  LABPROT 16.8*  --   --   --   --   --   INR 1.3*  --   --   --   --   --   HEPARINUNFRC  --    < > 0.20* 0.23* 0.21* 0.35  CREATININE 9.98*  --  6.43*  --   --  8.46*   < > = values in this interval not displayed.    Estimated Creatinine Clearance: 9.4 mL/min (A) (by C-G formula based on SCr of 8.46 mg/dL (H)).  Assessment: 68 yo male with RLE  ischemia for heparin .  Heparin  level is therapeutic on 1900 units/hr.  Noted plans for angiogram 8/13 at 1130.   Goal of Therapy:  Heparin  level 0.3-0.7 units/ml Monitor platelets by anticoagulation protocol: Yes   Plan:  Continue heparin  at 1900 units/hr Did not order confirmation level at this time as heparin  will be off for angiogram later today.   Follow-up after procedure for [facility] plans.   [employer], Pharm.D., BCPS Clinical Pharmacist Clinical phone for 10/27/2023 from 7:30-3:00 is [phone].  **Pharmacist phone directory can be found on [email or web address] listed under [facility].  10/27/2023 10:17 AM

## 2023-10-27 NOTE — Progress Notes (Signed)
  Progress Note    10/27/2023 12:58 PM * Day of Surgery *  Subjective:  continues to have RLE toe pain    Vitals:   10/27/23 0951 10/27/23 1156  BP:    Pulse:    Resp:    Temp: 98 F (36.7 C)   SpO2:  100%    Physical Exam: General:  laying in bed, A&Ox3 Cardiac:  regular Lungs:  nonlabored Extremities:  wounds on the left posterior calf and right toes unchanged   CBC    Component Value Date/Time   WBC 5.3 10/27/2023 0820   RBC 4.17 (L) 10/27/2023 0820   HGB 11.6 (L) 10/27/2023 0820   HCT 35.9 (L) 10/27/2023 0820   PLT 159 10/27/2023 0820   MCV 86.1 10/27/2023 0820   MCH 27.8 10/27/2023 0820   MCHC 32.3 10/27/2023 0820   RDW 16.9 (H) 10/27/2023 0820   LYMPHSABS 0.7 10/25/2023 1123   MONOABS 0.5 10/25/2023 1123   EOSABS 0.3 10/25/2023 1123   BASOSABS 0.1 10/25/2023 1123    BMET    Component Value Date/Time   NA 135 10/27/2023 0820   K 4.5 10/27/2023 0820   CL 92 (L) 10/27/2023 0820   CO2 24 10/27/2023 0820   GLUCOSE 59 (L) 10/27/2023 0820   BUN 52 (H) 10/27/2023 0820   CREATININE 8.46 (H) 10/27/2023 0820   CALCIUM  9.6 10/27/2023 0820   GFRNONAA 6 (L) 10/27/2023 0820    INR    Component Value Date/Time   INR 1.3 (H) 10/25/2023 1123     Intake/Output Summary (Last 24 hours) at 10/27/2023 1258 Last data filed at 10/27/2023 9350 Gross per 24 hour  Intake 915.68 ml  Output --  Net 915.68 ml      Assessment/Plan:  68 y.o. male with right lower extremity rest pain, left lower extremity tissue loss on the calf, which is from a spider bite, not due to peripheral arterial disease.  After discussing the risk and benefits of right lower extremity angiogram in an effort to define and improve distal perfusion to alleviate rest pain, Dillin elected to proceed.

## 2023-10-27 NOTE — Progress Notes (Signed)
Pt taken off floor to dialysis

## 2023-10-27 NOTE — Progress Notes (Signed)
 Pt received back post procedure. Sleeping and desating intermittently. Applied [place] at 2L.

## 2023-10-27 NOTE — Op Note (Signed)
 Patient name: Jacob Daniel MRN: [id number] DOB: 06/13/1955 Sex: male  10/25/2023 - 10/27/2023 Pre-operative Diagnosis: Right lower extremity rest pain Post-operative diagnosis:  Same Surgeon:  Fonda FORBES Rim, MD Procedure Performed: 1.  Ultrasound-guided micropuncture access of the left common femoral artery in retrograde fashion 2.  Aortogram 3.  Second order cannulation, left lower extremity angiogram 4.  Selective angiography of the superficial femoral artery 5.  Selective angiography of the posterior tibial artery 6.  Attempted recanalization of the posterior tibial artery-unsuccessful 7.  Left lower extremity angiogram 8.  Arteriotomy manage with next device Contrast volume 140 cc, sedation time 72 minutes   Indications: Patient is a 68 year old male with peripheral arterial disease and right sided rest pain.  He presented to the hospital due to worsening pain in the right toes.  He has a wound on the left calf from a spider bite.  I have discussed the risks and benefits of right lower extremity angiography in an effort to define and improve distal perfusion to alleviate rest pain, Cliff elected to proceed.  Findings:   Sluggish flow throughout indicative of heart failure-known ejection fraction 30 to 35% 6 months ago Aorta: Severe atherosclerotic disease throughout.  No flow-limiting stenosis identified  Right leg: Severe atherosclerotic disease throughout.  Widely patent common femoral artery, profunda, superficial femoral artery, popliteal artery.  Distally there is single-vessel peroneal artery outflow filling the foot through collaterals.  The posterior tibial artery has a high takeoff, but occludes.  Severe small vessel disease.  Left leg: Severe atherosclerotic disease throughout.  Widely patent common femoral artery, profunda, superficial femoral artery, popliteal artery.  There appears to be three-vessels opacifying in the calf, however distally contrast washout due to  heart failure, and distal tibial vessels and pedal vessels were not visualized.   Procedure:  The patient was identified in the holding area and taken to room 8.  The patient was then placed supine on the table and prepped and draped in the usual sterile fashion.  A time out was called.  Moderate sedation was administered using Fentanyl  and Versed .  Ultrasound was used to evaluate the left common femoral artery.  It was patent.  A digital ultrasound image was acquired.  A micropuncture needle was used to access the left common femoral artery under ultrasound guidance.  An 018 wire was advanced without resistance and a micropuncture sheath was placed.  The 018 wire was removed and a benson wire was placed.  The micropuncture sheath was exchanged for a 5 french sheath.  An omniflush catheter was advanced over the wire to the level of L-1.  An abdominal angiogram was obtained.  Next, using the omniflush catheter and a benson wire, the aortic bifurcation was crossed and the catheter was placed into theright external iliac artery and right runoff was obtained.  left runoff was performed via retrograde sheath injections.  See results above.  I elected to attempt intervention on the posterior tibial artery as it appeared to fill in very small islands from collaterals.  A 6 x 60 cm sheath was brought into the field and parked in the right superficial femoral artery.  The patient was heparinized.  A series of wires and catheters were used to navigate into the high takeoff of the posterior tibial artery.  A series of wires and catheters were used including 0.014 wires, however I was unable to cross the occlusion.  I elected not to attempt retrograde intervention as outflow was poor.  I closed the percutaneous access site with a Mynx device.  The patient became very aggravated and angry causing the Mynx device to fail and manual pressure was held.  The patient was administered another 1 mg of Versed  to allow for safe  arteriotomy management.  Impression: Unsuccessful attempt at recanalization of the right posterior tibial artery.  Severe microvascular disease.  Patient is maximally revascularized with no further options.  Pain is multifactorial from sluggish flow due to heart failure and severe peripheral arterial, and microvascular disease.  No wounds or rest pain in the right leg.  Patient would benefit from orthopedic surgery evaluation, likely debridement.  Should wound healing be a problem, would happily perform diagnostic angiography with possible intervention on the left leg.   Fonda FORBES Rim MD [facility] Office: [phone]

## 2023-10-27 NOTE — Progress Notes (Signed)
 Patient ID: Jacob Daniel, male   DOB: 05-Nov-1955, 68 y.o.   MRN: [id number] S: complaining of right foot pain O:BP (!) 164/97   Pulse (!) 56   Temp 98 F (36.7 C) (Oral)   Resp 17   Ht 6' 1 (1.854 m)   Wt 81.4 kg   SpO2 100%   BMI 23.68 kg/m   Intake/Output Summary (Last 24 hours) at 10/27/2023 1055 Last data filed at 10/27/2023 9350 Gross per 24 hour  Intake 915.68 ml  Output --  Net 915.68 ml   Intake/Output: I/O last 3 completed shifts: In: 1979 [P.O.:240; I.V.:1291.1; IV Piggyback:447.9] Out: 2500 [Other:2500]  Intake/Output this shift:  No intake/output data recorded. Weight change:  Gen: NAD CVS: bradycardic  Resp:CTA Abd: +BS, soft, NT/ND Ext: LUE AVF +T/B  Recent Labs  Lab 10/25/23 1123 10/26/23 0338 10/27/23 0820  NA 135 134* 135  K 5.0 3.7 4.5  CL 96* 94* 92*  CO2 23 25 24   GLUCOSE 109* 65* 59*  BUN 67* 38* 52*  CREATININE 9.98* 6.43* 8.46*  ALBUMIN  2.8*  --   --   CALCIUM  9.5 8.5* 9.6  AST 22  --   --   ALT 16  --   --    Liver Function Tests: Recent Labs  Lab 10/25/23 1123  AST 22  ALT 16  ALKPHOS 76  BILITOT 1.6*  PROT 8.4*  ALBUMIN  2.8*   No results for input(s): LIPASE, AMYLASE in the last 168 hours. No results for input(s): AMMONIA in the last 168 hours. CBC: Recent Labs  Lab 10/25/23 1123 10/26/23 0338 10/27/23 0820  WBC 5.0 4.5 5.3  NEUTROABS 3.5  --   --   HGB 10.9* 10.3* 11.6*  HCT 33.3* 31.2* 35.9*  MCV 86.9 86.0 86.1  PLT 145* 122* 159   Cardiac Enzymes: No results for input(s): CKTOTAL, CKMB, CKMBINDEX, TROPONINI in the last 168 hours. CBG: Recent Labs  Lab 10/26/23 0518 10/26/23 1101 10/26/23 1344 10/26/23 1442  GLUCAP 64* 74 69* 85    Iron Studies: No results for input(s): IRON, TIBC, TRANSFERRIN, FERRITIN in the last 72 hours. Studies/Results: VAS US  ABI WITH/WO TBI Result Date: 10/26/2023  LOWER EXTREMITY DOPPLER STUDY Patient Name:  Jacob Daniel  Date of Exam:   10/26/2023  Medical Rec #: [id number]         Accession #:    [id number] Date of Birth: [birth year]         Patient Gender: M Patient Age:   24 years Exam Location:  [facility] Procedure:      VAS US  ABI WITH/WO TBI Referring Phys: JOSHUA ROBINS --------------------------------------------------------------------------------  Indications: critical limb ischemia High Risk Factors: Hypertension, hyperlipidemia, Diabetes, no history of tobacco                    use, marijuana use, coronary artery disease. Other Factors: CHF, Afib, ESRD (HD).  Comparison Study: No previous exams Performing Technologist: Hill, Jody RVT, RDMS  Examination Guidelines: A complete evaluation includes at minimum, Doppler waveform signals and systolic blood pressure reading at the level of bilateral brachial, anterior tibial, and posterior tibial arteries, when vessel segments are accessible. Bilateral testing is considered an integral part of a complete examination. Photoelectric Plethysmograph (PPG) waveforms and toe systolic pressure readings are included as required and additional duplex testing as needed. Limited examinations for reoccurring indications may be performed as noted.  ABI Findings: +---------+------------------+-----+-------------------+-------------+ Right    Rt Pressure (mmHg)IndexWaveform  Comment       +---------+------------------+-----+-------------------+-------------+ Brachial 168                    multiphasic        Hx of RUE AVF +---------+------------------+-----+-------------------+-------------+ PTA      109               0.65 dampened monophasic              +---------+------------------+-----+-------------------+-------------+ DP       138               0.82 monophasic                       +---------+------------------+-----+-------------------+-------------+ Great Toe0                 0.00 Absent                            +---------+------------------+-----+-------------------+-------------+ +---------+------------------+-----+-------------------+---------+ Left     Lt Pressure (mmHg)IndexWaveform           Comment   +---------+------------------+-----+-------------------+---------+ Brachial                                           HD access +---------+------------------+-----+-------------------+---------+ PTA      254               1.51 dampened monophasic          +---------+------------------+-----+-------------------+---------+ DP       254               1.51 dampened monophasic          +---------+------------------+-----+-------------------+---------+ Great Toe0                 0.00 Absent                       +---------+------------------+-----+-------------------+---------+  Arterial wall calcification precludes accurate ankle pressures and ABIs.  Summary: Right: Resting right ankle-brachial index indicates mild right lower extremity arterial disease, however this may be falsely elevated due to arterial wall calcification. The right toe-brachial index is absent. Left: Resting left ankle-brachial index indicates noncompressible left lower extremity arteries. The left toe-brachial index is absent. *See table(s) above for measurements and observations.  Electronically signed by Penne Colorado MD on 10/26/2023 at 10:07:25 PM.    Final    CT Angio Aortobifemoral W and/or Wo Contrast Result Date: 10/25/2023 CLINICAL DATA:  Claudication or leg ischemia, right foot pain/swelling for 3 days EXAM: CT ANGIOGRAPHY CHEST WITH CONTRAST TECHNIQUE: Multidetector CT imaging of the abdomen and pelvis with run-off to both lower extremities was performed using the standard protocol during bolus administration of intravenous contrast. Multiplanar CT image reconstructions and MIPs were obtained to evaluate the vascular anatomy. RADIATION DOSE REDUCTION: This exam was performed according to the departmental  dose-optimization program which includes automated exposure control, adjustment of the mA and/or kV according to patient size and/or use of iterative reconstruction technique. CONTRAST:  OMNIPAQUE  IOHEXOL  350 MG/ML SOLN COMPARISON:  May 04, 2023 FINDINGS: VASCULAR Aorta: No aneurysm or aortic dissection. No acute thrombus. Diffuse calcified atherosclerosis throughout. Celiac: Patent without aneurysm or dissection.No hemodynamically significant stenosis. SMA: Patent without aneurysm or dissection.No hemodynamically significant stenosis. Renals: Patent without aneurysm or dissection.Diffusely calcified with moderate stenoses  of both renal ostia from calcified plaques. IMA: Patent without aneurysm or dissection.Moderate to severe stenosis of the ostium due to calcified plaque. RIGHT Lower Extremity Inflow: Common, internal and external iliac arteries are patent without aneurysm, acute thrombus, or dissection. Diffuse calcified atherosclerosis throughout without hemodynamically significant stenosis. Outflow: The common and proximal superficial femoral arteries are patent with diffuse calcified plaque throughout. The profunda femoral artery is also patent with diffuse calcified atherosclerosis throughout. Decreased opacification of the mid and distal superficial femoral artery with thrombosis of the popliteal artery starting in the above knee segment. Runoff: Extensive calcified atherosclerosis throughout the trifurcation vessels, which are not opacified. LEFT Lower Extremity Inflow: Common, internal and external iliac arteries are patent without aneurysm, acute thrombus, or dissection. Diffuse calcified atherosclerosis throughout without hemodynamically significant stenosis. Outflow: Common, superficial, and profunda femoral are patent without acute thrombus, aneurysm, or dissection. Diffuse calcified atherosclerosis throughout the common and superficial femoral arteries without hemodynamically significant  stenosis. The popliteal artery is also diffusely calcified. There is delayed perfusion of the above and behind knee popliteal artery. Runoff: Extensive calcified atherosclerosis throughout the trifurcation vessels, which are not opacified. Veins: Not evaluated due to the phase of contrast. Review of the MIP images confirms the above findings. NON-VASCULAR Lower chest: Mild diffuse prominence of the pulmonary interstitium with intralobular septal thickening.Bibasilar atelectasis. Mild cardiomegaly with densely calcified coronary arteries. Hepatobiliary: No mass.Multiple small radiopaque gallstones. No wall thickening.No intrahepatic or extrahepatic biliary ductal dilation. Pancreas: No mass or main ductal dilation.No peripancreatic inflammation or fluid collection. Spleen: Normal size. No mass. Adrenals/Urinary Tract: No adrenal masses. The renal cortices of both kidneys are full of cysts. No hydronephrosis or nephrolithiasis. The urinary bladder is completely decompressed. 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 Stomach/Bowel: The stomach is decompressed without focal abnormality. No small bowel wall thickening or inflammation. No small bowel obstruction. Scattered total colonic diverticulosis. Vascular/Lymphatic: No intraabdominal or pelvic lymphadenopathy. Reproductive: No prostatomegaly.No free pelvic fluid. Other: Moderate volume ascites.  No pneumoperitoneum. Musculoskeletal: No acute fracture or destructive lesion.Subcutaneous edema in both calves. Bone changes consistent with renal osteodystrophy. Multilevel degenerative disc disease of the spine. IMPRESSION: VASCULAR No abdominal aortic aneurysm or aortic dissection. Extensive aortic atherosclerosis without hemodynamically significant stenosis. Severe peripheral vascular atherosclerosis also noted in both legs. Aortic Atherosclerosis (ICD10-I70.0). Right leg: 1. Diffuse calcified atherosclerosis throughout the inflow and proximal outflow  vessels. Decreased opacification of the mid and distal superficial femoral artery with thrombosis of the popliteal artery starting in the above knee segment. 2. Non opacification of the trifurcation vessels below the knee, which may be due to non opacification or thrombosis. Left leg: 1. No acute thrombus or hemodynamically significant stenosis within the inflow or proximal outflow vessels. 2. Delayed contrast opacification of the above and behind knee popliteal artery without significant opacification of the trifurcation vessels below the knee, possibly due to slow flow. NON-VASCULAR 1. Mild prominence of the pulmonary interstitium in both lung bases with minimal intralobular septal thickening, likely representing early changes of edema. 2. Total colonic diverticulosis. No changes of acute diverticulitis. 3. Few small calcified gallstones present. 4. Moderate volume ascites. Critical Value/emergent results were called by telephone at the time of interpretation on 10/25/2023 at 3:28 pm to provider BERNARDINO FIREMAN , who verbally acknowledged these results. Electronically Signed   By: Rogelia Myers M.D.   On: 10/25/2023 15:37    atorvastatin   10 mg Oral Daily   calcium  acetate  2,001 mg Oral TID WC   Chlorhexidine  Gluconate Cloth  6 each Topical Q0600   ferrous sulfate   325 mg Oral Q breakfast   irbesartan   300 mg Oral Daily   metoprolol  succinate  75 mg Oral Daily   sodium bicarbonate   650 mg Oral BID    BMET    Component Value Date/Time   NA 135 10/27/2023 0820   K 4.5 10/27/2023 0820   CL 92 (L) 10/27/2023 0820   CO2 24 10/27/2023 0820   GLUCOSE 59 (L) 10/27/2023 0820   BUN 52 (H) 10/27/2023 0820   CREATININE 8.46 (H) 10/27/2023 0820   CALCIUM  9.6 10/27/2023 0820   GFRNONAA 6 (L) 10/27/2023 0820   CBC    Component Value Date/Time   WBC 5.3 10/27/2023 0820   RBC 4.17 (L) 10/27/2023 0820   HGB 11.6 (L) 10/27/2023 0820   HCT 35.9 (L) 10/27/2023 0820   PLT 159 10/27/2023 0820   MCV 86.1  10/27/2023 0820   MCH 27.8 10/27/2023 0820   MCHC 32.3 10/27/2023 0820   RDW 16.9 (H) 10/27/2023 0820   LYMPHSABS 0.7 10/25/2023 1123   MONOABS 0.5 10/25/2023 1123   EOSABS 0.3 10/25/2023 1123   BASOSABS 0.1 10/25/2023 1123    Dialysis Orders: Center: [facility]  on MWF . EDW 84kg HD Bath 2K/2.5Ca  Time 4:00 Heparin  1000 bolus then 600 units/hr. 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  To have angiogram today per [facility].  Currently on heparin  drip.  ABI revealed absent right toe-brachial index. Left lower extremity cellulitis - started on IV vanco and zosyn .    ESRD -  continue with HD on MWF schedule  Hypertension/volume  - stable, no evidence of volume overload.  He is below his EDW.  Anemia  - stable, due to ESA this week  Metabolic bone disease -  continue with home meds  Nutrition -  renal diet  Fairy RONAL Sellar, MD [facility] [phone]

## 2023-10-27 NOTE — Progress Notes (Signed)
 PROGRESS NOTE Jacob Daniel  FMW:[id number] DOB: 08-28-1955 DOA: 10/25/2023 PCP: Pcp, No  Brief Narrative/Hospital Course: 68 year old man with multiple medical comorbidities including ESRD on HD MWF, failed renal transplant, HTN HLD PAF, chronic systolic CHF/pulmonary hypertension/heart failure, CAD, OSA metabolic bone disease presented to the ED with complaint of right foot pain and swelling x 3 days.  About 3 weeks ago was bit by a spider on his left calf seen by PCP, was given 7 days of doxycycline  which he completed without improvement, initially about 2 cm dark area on the posterior left lower leg, he is having pain on rt grt toes for 3 days and swelling pain on rt foot as well and has been getting worse with swelling and pain. Patient otherwise denies any nausea, vomiting, chest pain, shortness of breath, fever, chills, headache, focal weakness, numbness tingling, speech difficulties   In the ED vitals stable labs potassium 5.0 BUN 67 creatinine 9.9 total bili 1.6 albumin  2.8 lactic acid 1.8> 3.8, labs showed mild anemia and thrombocytopenia.  Blood culture obtained. Initially unable to Doppler pulses in all 5 toes very faint dopplerable pulses in the right PT and DP concern for limb ischemia, vascular surgery was consulted, heparin  was given imaging CT angio runoff/x-ray foot-showed confirmation of vasculopathy with concern for popliteal artery occlusion. Patient was placed on vancomycin , Zosyn  and admission was requested for further management.  Subjective: Seen and examined Overnight mild bradycardia in 50s BP stable otherwise Patient is still having pain he is waiting for vascular intervention this morning Is due for dialysis today no chest pain nausea vomiting  Assessment and plan:  LLE cellulitis, non healing wound on left calf -believed to be spider bite   RLE rest pain with thrombosis of the popliteal artery from above the knee, nonopacification of the trifurcation vessels below the  knee, with small toe ulceration-concerning for critical limb ischemia with tissue loss: Patient underwent initial CT perfusion scan in the ED showing thrombosis see report.  Vascular consulted and placed on heparin  drip for critical limb ischemia with tissue loss.  Also started on empiric antibiotics for possible cellulitis. Plan is for lower extremity angiogram to improve distal perfusion for wound healing on Wednesday ABI indicates mild right lower extremity arterial disease may be falsely elevated due to arterial wall calcification right TBI is absent left ABI indicates noncompressible arteries left TBI absent  Continue IV vancomycin  and Zosyn , heparin  drip, pain management Awaiting vascular intervention today   Lactic acidosis: Suspect due to patient's leg ischemia versus infection. Resolved   ESRD on HD MWF Failed renal transplant-not taking tacrolimus  anymore Anemia of renal disease Metabolic bone disease: Nephrology following closely got HD 8/11 night -8/12 am-he is due for HD today Cont home PhosLo , ferrous sulfate  Monitor hemoglobin, electrolytes   Hypoalbuminemia Mild hypoglycemia Elevated TB: Augment diet monitor labs.  Monitor blood sugar  Recent Labs  Lab 10/26/23 0518 10/26/23 1101 10/26/23 1344 10/26/23 1442  GLUCAP 64* 74 69* 85     HTN: BP well-controlled on metoprolol  and Avapro    HLD: Continue Lipitor   PAF: PTA on Eliquis  currently on heparin    Chronic systolic CHF/pulmonary hypertension/heart failure Euvolemic, continue to address fluid status with dialysis   CAD No chest pain.   OSA Did not tolerate so not using for few years  Mobility: Independent  DVT prophylaxis: Heparin  drip Code Status:   Code Status: Full Code Family Communication: plan of care discussed with patient at bedside. Patient status is: Remains hospitalized because  of severity of illness Level of care: Progressive   Dispo: The patient is from: home            Anticipated  disposition: TBD Objective: Vitals last 24 hrs: Vitals:   10/26/23 1621 10/27/23 0029 10/27/23 0950 10/27/23 0951  BP: (!) 138/95 131/75 (!) 164/97   Pulse: (!) 55 (!) 56    Resp: 20 (!) 21 17   Temp: 98 F (36.7 C) 97.6 F (36.4 C)  98 F (36.7 C)  TempSrc: Oral Oral  Oral  SpO2: 95% 98% 100%   Weight:      Height:        Physical Examination: General exam:  AAOX3 HEENT:Oral mucosa moist, Ear/Nose WNL grossly Respiratory system: Bilaterally clear BS,no use of accessory muscle Cardiovascular system: S1 & S2 +, No JVD. Gastrointestinal system: Abdomen soft,NT,ND, BS+ Nervous System: Alert, awake, moving all extremities,and following commands. Extremities: Left calf w/ wound blackish skin discoloration, bilateral foot appears cold mild edema on the foot  Skin: No rashes,no icterus. MSK: Normal muscle bulk,tone, power   Medications reviewed:  Scheduled Meds:  [MAR Hold] atorvastatin   10 mg Oral Daily   [MAR Hold] calcium  acetate  2,001 mg Oral TID WC   [MAR Hold] Chlorhexidine  Gluconate Cloth  6 each Topical Q0600   [MAR Hold] ferrous sulfate   325 mg Oral Q breakfast   [MAR Hold] irbesartan   300 mg Oral Daily   [MAR Hold] metoprolol  succinate  75 mg Oral Daily   [MAR Hold] sodium bicarbonate   650 mg Oral BID   Continuous Infusions:  [MAR Hold] anticoagulant sodium citrate      heparin  1,900 Units/hr (10/27/23 0649)   [MAR Hold] piperacillin -tazobactam (ZOSYN )  IV 100 mL/hr at 10/27/23 0649   [MAR Hold] vancomycin      Diet: Diet Order             Diet NPO time specified  Diet effective midnight                    Data Reviewed: I have personally reviewed following labs and imaging studies ( see epic result tab) CBC: Recent Labs  Lab 10/25/23 1123 10/26/23 0338 10/27/23 0820  WBC 5.0 4.5 5.3  NEUTROABS 3.5  --   --   HGB 10.9* 10.3* 11.6*  HCT 33.3* 31.2* 35.9*  MCV 86.9 86.0 86.1  PLT 145* 122* 159   CMP: Recent Labs  Lab 10/25/23 1123  10/26/23 0338 10/27/23 0820  NA 135 134* 135  K 5.0 3.7 4.5  CL 96* 94* 92*  CO2 23 25 24   GLUCOSE 109* 65* 59*  BUN 67* 38* 52*  CREATININE 9.98* 6.43* 8.46*  CALCIUM  9.5 8.5* 9.6   GFR: Estimated Creatinine Clearance: 9.4 mL/min (A) (by C-G formula based on SCr of 8.46 mg/dL (H)). Recent Labs  Lab 10/25/23 1123  AST 22  ALT 16  ALKPHOS 76  BILITOT 1.6*  PROT 8.4*  ALBUMIN  2.8*   No results for input(s): LIPASE, AMYLASE in the last 168 hours. No results for input(s): AMMONIA in the last 168 hours. Coagulation Profile:  Recent Labs  Lab 10/25/23 1123  INR 1.3*   Unresulted Labs (From admission, onward)     Start     Ordered   10/28/23 0500  Heparin  level (unfractionated)  Daily,   R     Placed in And Linked Group   10/26/23 2332   10/27/23 0500  CBC  Daily,   R  Placed in And Linked Group   10/26/23 2332   10/26/23 0500  Basic metabolic panel with GFR  Daily,   R      10/25/23 1637   10/26/23 0500  CBC  Daily,   R      10/25/23 1637   10/26/23 0130  MRSA Next Gen by PCR, Nasal  Once,   R        10/26/23 0129   Signed and Held  Renal function panel  Once,   R        Signed and Held   Signed and Held  CBC  Once,   R        Signed and Held   Signed and Held  Renal function panel  Once,   R        Signed and Held   Medical illustrator and Held  CBC  Once,   R        Signed and Held           Antimicrobials/Microbiology: Anti-infectives (From admission, onward)    Start     Dose/Rate Route Frequency Ordered Stop   10/27/23 1200  [MAR Hold]  vancomycin  (VANCOCIN ) IVPB 1000 mg/200 mL premix        (MAR Hold since Wed 10/27/2023 at 1139.Hold Reason: Transfer to a Procedural area)   1,000 mg 200 mL/hr over 60 Minutes Intravenous Every M-W-F (Hemodialysis) 10/25/23 1659     10/26/23 1600  vancomycin  (VANCOCIN ) IVPB 1000 mg/200 mL premix        1,000 mg 200 mL/hr over 60 Minutes Intravenous  Once 10/26/23 1456 10/26/23 2112   10/25/23 2200  [MAR Hold]   piperacillin -tazobactam (ZOSYN ) IVPB 2.25 g        (MAR Hold since Wed 10/27/2023 at 1139.Hold Reason: Transfer to a Procedural area)   2.25 g 100 mL/hr over 30 Minutes Intravenous Every 8 hours 10/25/23 1654     10/25/23 1530  piperacillin -tazobactam (ZOSYN ) IVPB 3.375 g        3.375 g 100 mL/hr over 30 Minutes Intravenous  Once 10/25/23 1515 10/25/23 1628   10/25/23 1530  vancomycin  (VANCOREADY) IVPB 2000 mg/400 mL        2,000 mg 200 mL/hr over 120 Minutes Intravenous  Once 10/25/23 1515 10/25/23 1807         Component Value Date/Time   SDES BLOOD SITE NOT SPECIFIED 10/25/2023 1156   SPECREQUEST  10/25/2023 1156    BOTTLES DRAWN AEROBIC AND ANAEROBIC Blood Culture results may not be optimal due to an inadequate volume of blood received in culture bottles   CULT  10/25/2023 1156    NO GROWTH 2 DAYS Performed at [facility], [address] [place], [place] [address]    REPTSTATUS PENDING 10/25/2023 1156    Procedures: Procedure(s) (LRB): ABDOMINAL AORTOGRAM W/LOWER EXTREMITY (N/A)   Mennie LAMY, MD [facility] 10/27/2023, 11:44 AM

## 2023-10-27 NOTE — Progress Notes (Signed)
 Received patient in bed to unit.  Alert and oriented.  Informed consent signed and in chart.   TX duration: 3.5  Patient tolerated well.  Transported back to the room  Alert, without acute distress.  Hand-off given to patient's nurse. Sotero RN  Access used: Left AVF Access issues: None  Total UF removed: 2500 Medication(s) given: None Post HD VS: 98.1 Post HD weight: 78.9  Neville Seip, RN [facility]   10/27/23 1940  Vitals  Temp 98.1 F (36.7 C)  BP (!) 159/91  BP Location Right Arm  BP Method Automatic  Patient Position (if appropriate) Lying  Pulse Rate (!) 59  ECG Heart Rate (!) 59  Resp 18  Level of Consciousness  Level of Consciousness Alert  MEWS COLOR  MEWS Score Color Melaya [place]  Oxygen Therapy  SpO2 100 %  O2 Device Nasal Cannula  O2 Flow Rate (L/min) 2 L/min  Pain Assessment  Pain Scale 0-10  Pain Score 2  PCA/Epidural/Spinal Assessment  Respiratory Pattern Regular;Unlabored  Height and Weight  Weight 78.9 kg  BMI (Calculated) 22.95  ECG Monitoring  Cardiac Rhythm NSR;SB  MEWS Score  MEWS Temp 0  MEWS Systolic 0  MEWS Pulse 0  MEWS RR 0  MEWS LOC 0  MEWS Score 0

## 2023-10-27 NOTE — Progress Notes (Signed)
 Mobility Specialist Progress Note:    10/27/23 1017  Mobility  Activity Ambulated with assistance  Level of Assistance Minimal assist, patient does 75% or more  Assistive Device Front wheel walker  Distance Ambulated (ft) 200 ft  Activity Response Tolerated well  Mobility Referral Yes  Mobility visit 1 Mobility  Mobility Specialist Start Time (ACUTE ONLY) 1017  Mobility Specialist Stop Time (ACUTE ONLY) 1026  Mobility Specialist Time Calculation (min) (ACUTE ONLY) 9 min   Pt received in bed, agreeable to mobility session. Ambulated in hallway with RW and MinA. Tolerated well, c/o LE pain (in toes). Returned pt to room, lying comfortably in bed with all needs met.    Jacob Daniel Mobility Specialist Please contact via Special educational needs teacher or  Rehab office at [phone]

## 2023-10-28 ENCOUNTER — Encounter (HOSPITAL_COMMUNITY): Payer: Self-pay | Admitting: Vascular Surgery

## 2023-10-28 DIAGNOSIS — I70201 Unspecified atherosclerosis of native arteries of extremities, right leg: Secondary | ICD-10-CM | POA: Diagnosis not present

## 2023-10-28 LAB — CBC
HCT: 33.3 % — ABNORMAL LOW (ref 39.0–52.0)
Hemoglobin: 10.8 g/dL — ABNORMAL LOW (ref 13.0–17.0)
MCH: 28.1 pg (ref 26.0–34.0)
MCHC: 32.4 g/dL (ref 30.0–36.0)
MCV: 86.7 fL (ref 80.0–100.0)
Platelets: 157 K/uL (ref 150–400)
RBC: 3.84 MIL/uL — ABNORMAL LOW (ref 4.22–5.81)
RDW: 16.7 % — ABNORMAL HIGH (ref 11.5–15.5)
WBC: 7 K/uL (ref 4.0–10.5)
nRBC: 0.3 % — ABNORMAL HIGH (ref 0.0–0.2)

## 2023-10-28 LAB — LIPID PANEL
Cholesterol: 104 mg/dL (ref 0–200)
HDL: 36 mg/dL — ABNORMAL LOW (ref >40)
LDL Cholesterol: 58 mg/dL (ref 0–99)
Total CHOL/HDL Ratio: 2.9 ratio
Triglycerides: 48 mg/dL (ref <150)
VLDL: 10 mg/dL (ref 0–40)

## 2023-10-28 LAB — BASIC METABOLIC PANEL WITH GFR
Anion gap: 12 (ref 5–15)
BUN: 29 mg/dL — ABNORMAL HIGH (ref 8–23)
CO2: 27 mmol/L (ref 22–32)
Calcium: 8.8 mg/dL — ABNORMAL LOW (ref 8.9–10.3)
Chloride: 95 mmol/L — ABNORMAL LOW (ref 98–111)
Creatinine, Ser: 6.06 mg/dL — ABNORMAL HIGH (ref 0.61–1.24)
GFR, Estimated: 9 mL/min — ABNORMAL LOW (ref >60)
Glucose, Bld: 84 mg/dL (ref 70–99)
Potassium: 3.8 mmol/L (ref 3.5–5.1)
Sodium: 134 mmol/L — ABNORMAL LOW (ref 135–145)

## 2023-10-28 LAB — SURGICAL PCR SCREEN
MRSA, PCR: NEGATIVE
Staphylococcus aureus: NEGATIVE

## 2023-10-28 MED ORDER — HYDROMORPHONE HCL 1 MG/ML IJ SOLN
1.0000 mg | INTRAMUSCULAR | Status: DC | PRN
  Administered 2023-10-28 – 2023-10-30 (×3): 1 mg via INTRAVENOUS
  Filled 2023-10-28 (×4): qty 1

## 2023-10-28 MED ORDER — CHLORHEXIDINE GLUCONATE CLOTH 2 % EX PADS: 6.0000 | MEDICATED_PAD | Freq: Every day | CUTANEOUS | Status: DC

## 2023-10-28 MED ORDER — OXYCODONE HCL 5 MG PO TABS
5.0000 mg | ORAL_TABLET | ORAL | Status: DC | PRN
  Administered 2023-10-28 – 2023-11-02 (×7): 10 mg via ORAL
  Administered 2023-11-02 – 2023-11-04 (×6): 5 mg via ORAL
  Administered 2023-11-04: 10 mg via ORAL
  Administered 2023-11-04: 5 mg via ORAL
  Administered 2023-11-05 – 2023-11-12 (×16): 10 mg via ORAL
  Administered 2023-11-12 – 2023-11-13 (×3): 5 mg via ORAL
  Administered 2023-11-13: 10 mg via ORAL
  Filled 2023-10-28: qty 1
  Filled 2023-10-28 (×5): qty 2
  Filled 2023-10-28: qty 1
  Filled 2023-10-28 (×8): qty 2
  Filled 2023-10-28: qty 1
  Filled 2023-10-28: qty 2
  Filled 2023-10-28: qty 1
  Filled 2023-10-28: qty 2
  Filled 2023-10-28: qty 1
  Filled 2023-10-28 (×5): qty 2
  Filled 2023-10-28: qty 1
  Filled 2023-10-28 (×5): qty 2
  Filled 2023-10-28: qty 1

## 2023-10-28 MED ORDER — MUPIROCIN 2 % EX OINT
1.0000 | TOPICAL_OINTMENT | Freq: Two times a day (BID) | CUTANEOUS | Status: AC
  Administered 2023-10-28 – 2023-11-01 (×7): 1 via NASAL
  Filled 2023-10-28 (×2): qty 22

## 2023-10-28 NOTE — Progress Notes (Signed)
 This RN received a call from central telemetry that patient was off of cardiac monitor. Knocked on door prior to entering and asked patient if it was okay to replace leads. Patient cooperated with lead replacement but became combative and agitated when this RN asked if he needed pain medication, stating that he was in pain but would not accept pain medication from you people.

## 2023-10-28 NOTE — Progress Notes (Addendum)
 Progress Note    10/28/2023 9:24 AM 1 Day Post-Op  Subjective: Frustrated because his right second toe is still hurting significantly    Vitals:   10/28/23 0738 10/28/23 0914  BP: (!) 144/86 120/82  Pulse: (!) 57   Resp: 18 13  Temp: 97.6 F (36.4 C)   SpO2:      Physical Exam: General: Laying in bed, NAD Cardiac: Regular Lungs: Nonlabored Incisions: Left groin cath site with slightly improved hematoma, however this area is soft Extremities: Ischemic changes to right second toe.  Dry eschar on left calf   CBC    Component Value Date/Time   WBC 7.0 10/28/2023 0358   RBC 3.84 (L) 10/28/2023 0358   HGB 10.8 (L) 10/28/2023 0358   HCT 33.3 (L) 10/28/2023 0358   PLT 157 10/28/2023 0358   MCV 86.7 10/28/2023 0358   MCH 28.1 10/28/2023 0358   MCHC 32.4 10/28/2023 0358   RDW 16.7 (H) 10/28/2023 0358   LYMPHSABS 0.7 10/25/2023 1123   MONOABS 0.5 10/25/2023 1123   EOSABS 0.3 10/25/2023 1123   BASOSABS 0.1 10/25/2023 1123    BMET    Component Value Date/Time   NA 134 (L) 10/28/2023 0358   K 3.8 10/28/2023 0358   CL 95 (L) 10/28/2023 0358   CO2 27 10/28/2023 0358   GLUCOSE 84 10/28/2023 0358   BUN 29 (H) 10/28/2023 0358   CREATININE 6.06 (H) 10/28/2023 0358   CALCIUM  8.8 (L) 10/28/2023 0358   GFRNONAA 9 (L) 10/28/2023 0358    INR    Component Value Date/Time   INR 1.3 (H) 10/25/2023 1123     Intake/Output Summary (Last 24 hours) at 10/28/2023 0924 Last data filed at 10/28/2023 0023 Gross per 24 hour  Intake 1332.99 ml  Output 2500 ml  Net -1167.01 ml      Assessment/Plan:  68 y.o. male is 1 day postop, s/p: Right lower extremity angiogram with attempted recanalization of the right PT   - The patient is frustrated this morning due to continued pain in his right second toe.  This pain is keeping him up at night.  He says that he is ready for what ever procedure needs to be done to get him out of pain -He underwent right lower extremity angiogram  yesterday with unsuccessful recanalization of the right PT. he is maximally revascularized in the right lower extremity.  -We have discussed with the patient that his next option is right BKA to get him out of ischemic pain.  He is agreeable to this.  We will try to perform right BKA tomorrow.  -In regards to the eschar on the patient's left calf, the patient would benefit from orthopedic consult and possible debridement of this area.  Should he have wound healing issues in the future after debridement, we can perform left lower extremity angiogram -Please continue to hold Eliquis .  Will make n.p.o. at midnight will place consent orders for right BKA tomorrow with Dr. Lanis Ahmed Holster, PA-C [facility] [phone] 10/28/2023 9:24 AM  VASCULAR STAFF ADDENDUM:  I have independently interviewed and examined the patient. I agree with the above.  Patient with continued right lower extremity rest pain.  Unfortunately I was unable to revascularize due to severe small vessel disease in the foot.  I have offered him a below-knee amputation with the understanding that with his end-stage renal disease, this may or may not heal.  Alm elected to proceed.  Will schedule for tomorrow.  Please make n.p.o. midnight.  Fonda FORBES Rim MD [facility] Phone Number: [phone] 10/28/2023 1:36 PM

## 2023-10-28 NOTE — Progress Notes (Signed)
 Patient refused to have his vital signs taken this morning.  Kristene Sotero BRAVO, RN

## 2023-10-28 NOTE — Progress Notes (Signed)
 At 1930, patient stated that his foot hurts but it hasn't been addressed at all since admission. MAR shows administration of both oxycodone  and dilaudid . Patient refused evening shift assessment, stated why are you in here going through my stuff?. Patient refused 2000 PO medications including , stating I don't trust you, I don't know what you put in there. Patient requested that medications be left at the bedside; this RN informed him that controlled substances may not be left. Attending physician notified of paranoia and medication refusal.

## 2023-10-28 NOTE — Progress Notes (Signed)
 Overnight Coverage Note  Refused several medications overnight.  Paranoid per RN.  Noted, will monitor.

## 2023-10-28 NOTE — Progress Notes (Addendum)
 PROGRESS NOTE Jacob Daniel  FMW:[id number] DOB: 1955-04-19 DOA: 10/25/2023 PCP: Pcp, No  Brief Narrative/Hospital Course: The patient is a 68 yr old man who presented to [facility] ED on 10/25/2023 with complaints of right foot pain and swelling x 3 days. About 3 weeks prior to presentation he  was bit by a spider on his left calf seen by PCP, was given 7 days of doxycycline  which he completed without improvement, initially about 2 cm dark area on the posterior left lower leg, he is having pain on rt grt toes for 3 days and swelling pain on rt foot as well and had been getting worse with swelling and pain.   In the ED doppler pulses bilaterally were concerning for limb ischemia. The patient was admitted on heparin . Vascular surgery was consulted. ABI's were ordered and indicated mild right lower extremity arterial disease and incompressible left ABI. The patient was continued on vancomycin  and zosyn . He received pain management.  Vascular surgery has performed lower extremity angiogram on 10/27/2023. It has demonstrated:  Sluggish flow throughout indicative of heart failure-known ejection fraction 30 to 35% 6 months ago Aorta: Severe atherosclerotic disease throughout.  No flow-limiting stenosis identified   Right leg: Severe atherosclerotic disease throughout.  Widely patent common femoral artery, profunda, superficial femoral artery, popliteal artery.  Distally there is single-vessel peroneal artery outflow filling the foot through collaterals.  The posterior tibial artery has a high takeoff, but occludes.  Severe small vessel disease.   Left leg: Severe atherosclerotic disease throughout.  Widely patent common femoral artery, profunda, superficial femoral artery, popliteal artery.  There appears to be three-vessels opacifying in the calf, however distally contrast washout due to heart failure, and distal tibial vessels and pedal vessels were not visualized.  Attempts to recanalize the right posterior  tibial arter were unsuccessful furu to severe microvascular disease. The patient had been maximally revascularized with no further options. His pain was determined to be due to sluggish flow due to heart failure and servere peripheral arterial and microvascular disease.   Recommendation was made for orthopedic surgery evaluation and likely debridement of the ulceration on the left leg.   Orthopedic surgery has been consulted.   Subjective: The patient is resting comfortably. No new complaints.   Assessment and plan:   LLE cellulitis, non healing wound on left calf -believed to be spider bite   RLE rest pain with thrombosis of the popliteal artery from above the knee, nonopacification of the trifurcation vessels below the knee, with small toe ulceration-concerning for critical limb ischemia with tissue loss: Patient underwent initial CT perfusion scan in the ED showing thrombosis see report.  Vascular consulted and placed on heparin  drip for critical limb ischemia with tissue loss.  Also started on empiric antibiotics for possible cellulitis. Plan is for lower extremity angiogram to improve distal perfusion for wound healing on Wednesday ABI indicates mild right lower extremity arterial disease may be falsely elevated due to arterial wall calcification right TBI is absent left ABI indicates noncompressible arteries left TBI absent  Continue IV vancomycin  and Zosyn , heparin  drip, pain management The patient underwent angiogram with attempted intervention on 10/27/2023. It has demonstrated:  Sluggish flow throughout indicative of heart failure-known ejection fraction 30 to 35% 6 months ago Aorta: Severe atherosclerotic disease throughout.  No flow-limiting stenosis identified   Right leg: Severe atherosclerotic disease throughout.  Widely patent common femoral artery, profunda, superficial femoral artery, popliteal artery.  Distally there is single-vessel peroneal artery outflow filling the foot  through collaterals.  The posterior tibial artery has a high takeoff, but occludes.  Severe small vessel disease.   Left leg: Severe atherosclerotic disease throughout.  Widely patent common femoral artery, profunda, superficial femoral artery, popliteal artery.  There appears to be three-vessels opacifying in the calf, however distally contrast washout due to heart failure, and distal tibial vessels and pedal vessels were not visualized.  Attempts to recanalize the right posterior tibial arter were unsuccessful furu to severe microvascular disease. The patient had been maximally revascularized with no further options. His pain was determined to be due to sluggish flow due to heart failure and servere peripheral arterial and microvascular disease.   Recommendation was made for orthopedic surgery evaluation and likely debridement of the ulceration on the left leg.    Lactic acidosis: Suspect due to patient's leg ischemia versus infection. 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 Cont home PhosLo , ferrous sulfate  Monitor hemoglobin, electrolytes   Hypoalbuminemia Mild hypoglycemia Elevated TB: Augment diet monitor labs.  Monitor blood sugar        Recent Labs  Lab 10/26/23 0518 10/26/23 1101 10/26/23 1344 10/26/23 1442  GLUCAP 64* 74 69* 85     HTN: BP well-controlled on metoprolol  and Avapro    HLD: Continue Lipitor   PAF: PTA on Eliquis  currently on heparin    Chronic systolic CHF/pulmonary hypertension/heart failure Euvolemic, continue to address fluid status with dialysis   CAD No chest pain.   OSA Did not tolerate so not using for few years   Mobility: Independent   DVT prophylaxis: Heparin  drip Code Status:   Code Status: Full Code Family Communication: plan of care discussed with patient  at bedside. Patient status is: Remains hospitalized because of severity of illness Level of care: Progressive    Dispo: The patient is from: home            Anticipated disposition: TBD Data Reviewed: I have personally reviewed following labs and imaging studies ( see epic result tab)  Vitals:   10/28/23 0738 10/28/23 0914  BP: (!) 144/86 120/82  Pulse: (!) 57   Resp: 18 13  Temp: 97.6 F (36.4 C)   SpO2:     Exam:  Constitutional:  The patient is awake, alert, and oriented x 3. No acute distress. Eyes:  pupils and irises appear normal Normal lids and conjunctivae ENMT:  grossly normal hearing  Lips appear normal external ears, nose appear normal Oropharynx: mucosa, tongue,posterior pharynx appear normal Neck:  neck appears normal, no masses, normal ROM, supple no thyromegaly Respiratory:  No increased work of breathing. No wheezes, rales, or rhonchi No tactile fremitus Cardiovascular:  Regular rate and rhythm No murmurs, ectopy, or gallups. No lateral PMI. No thrills. Abdomen:  Abdomen is soft, non-tender, non-distended No hernias, masses, or organomegaly Normoactive bowel sounds.  Musculoskeletal:  No cyanosis, clubbing, or edema Skin:  No rashes, lesions, ulcers palpation of skin: no induration or nodules Neurologic:  CN 2-12 intact Sensation all 4 extremities intact Psychiatric:  Mental status Mood, affect appropriate Orientation to person, place, time  judgment and insight appear intact  CBC: Recent Labs  Lab 10/25/23 1123 10/26/23 0338 10/27/23 0820 10/27/23 1455 10/28/23 0358  WBC 5.0 4.5 5.3 6.0 7.0  NEUTROABS 3.5  --   --   --   --   HGB 10.9* 10.3* 11.6*  10.6* 10.8*  HCT 33.3* 31.2* 35.9* 33.4* 33.3*  MCV 86.9 86.0 86.1 89.5 86.7  PLT 145* 122* 159 132* 157   CMP: Recent Labs  Lab 10/25/23 1123 10/26/23 0338 10/27/23 0820 10/27/23 1455 10/28/23 0358  NA 135 134* 135  --  134*  K 5.0 3.7 4.5  --  3.8  CL 96* 94* 92*  --  95*   CO2 23 25 24   --  27  GLUCOSE 109* 65* 59*  --  84  BUN 67* 38* 52*  --  29*  CREATININE 9.98* 6.43* 8.46* 8.90* 6.06*  CALCIUM  9.5 8.5* 9.6  --  8.8*   GFR: Estimated Creatinine Clearance: 13 mL/min (A) (by C-G formula based on SCr of 6.06 mg/dL (H)). Recent Labs  Lab 10/25/23 1123  AST 22  ALT 16  ALKPHOS 76  BILITOT 1.6*  PROT 8.4*  ALBUMIN  2.8*   No results for input(s): LIPASE, AMYLASE in the last 168 hours. No results for input(s): AMMONIA in the last 168 hours. Coagulation Profile:  Recent Labs  Lab 10/25/23 1123  INR 1.3*   Unresulted Labs (From admission, onward)     Start     Ordered   10/26/23 0500  Basic metabolic panel with GFR  Daily,   R      10/25/23 1637   10/26/23 0500  CBC  Daily,   R      10/25/23 1637   10/26/23 0130  MRSA Next Gen by PCR, Nasal  Once,   R        10/26/23 0129   Signed and Held  Renal function panel  Once,   R        Signed and Held   Signed and Held  CBC  Once,   R        Signed and Held   Signed and Held  Renal function panel  Once,   R        Signed and Held   Signed and Held  CBC  Once,   R        Signed and Held           Antimicrobials/Microbiology: Anti-infectives (From admission, onward)    Start     Dose/Rate Route Frequency Ordered Stop   10/27/23 1200  vancomycin  (VANCOCIN ) IVPB 1000 mg/200 mL premix        1,000 mg 200 mL/hr over 60 Minutes Intravenous Every M-W-F (Hemodialysis) 10/25/23 1659     10/26/23 1600  vancomycin  (VANCOCIN ) IVPB 1000 mg/200 mL premix        1,000 mg 200 mL/hr over 60 Minutes Intravenous  Once 10/26/23 1456 10/26/23 2112   10/25/23 2200  piperacillin -tazobactam (ZOSYN ) IVPB 2.25 g        2.25 g 100 mL/hr over 30 Minutes Intravenous Every 8 hours 10/25/23 1654     10/25/23 1530  piperacillin -tazobactam (ZOSYN ) IVPB 3.375 g        3.375 g 100 mL/hr over 30 Minutes Intravenous  Once 10/25/23 1515 10/25/23 1628   10/25/23 1530  vancomycin  (VANCOREADY) IVPB 2000 mg/400 mL         2,000 mg 200 mL/hr over 120 Minutes Intravenous  Once 10/25/23 1515 10/25/23 1807         Component Value Date/Time   SDES BLOOD SITE NOT SPECIFIED 10/25/2023 1156   SPECREQUEST  10/25/2023 1156    BOTTLES DRAWN AEROBIC AND ANAEROBIC Blood Culture results may not be optimal due to an inadequate volume  of blood received in culture bottles   CULT  10/25/2023 1156    NO GROWTH 3 DAYS Performed at [facility], [address] [place], [place] [address]    REPTSTATUS PENDING 10/25/2023 1156    Procedures: Procedure(s) (LRB): ABDOMINAL AORTOGRAM W/LOWER EXTREMITY (N/A) LOWER EXTREMITY INTERVENTION (Right)   Brigida Bureau, MD [facility] 10/28/2023, 6:20 PM

## 2023-10-28 NOTE — Progress Notes (Signed)
 Jacob Daniel Progress Note   Subjective:   Patient seen and examined in his room. He reports that he was unable to get much sleep last night due to the pain in his foot and staff coming in throughout the night. He reports that his previous HD session went well. He denies any dyspnea or CP.  Next HD 8/15  Objective Vitals:   10/27/23 2110 10/27/23 2310 10/28/23 0738 10/28/23 0914  BP: (!) 150/66 122/86 (!) 144/86 120/82  Pulse: 61 (!) 56 (!) 57   Resp: 20 18 18 13   Temp: 97.6 F (36.4 C)  97.6 F (36.4 C)   TempSrc: Oral Oral Oral   SpO2: 93% 92%    Weight:      Height:       Physical Exam Gen: NAD CVS: bradycardic  Resp:CTA Abd: +BS, soft, NT/ND Ext: LUE AVF +T/B  Additional Objective Labs: Basic Metabolic Panel: Recent Labs  Lab 10/26/23 0338 10/27/23 0820 10/27/23 1455 10/28/23 0358  NA 134* 135  --  134*  K 3.7 4.5  --  3.8  CL 94* 92*  --  95*  CO2 25 24  --  27  GLUCOSE 65* 59*  --  84  BUN 38* 52*  --  29*  CREATININE 6.43* 8.46* 8.90* 6.06*  CALCIUM  8.5* 9.6  --  8.8*   Liver Function Tests: Recent Labs  Lab 10/25/23 1123  AST 22  ALT 16  ALKPHOS 76  BILITOT 1.6*  PROT 8.4*  ALBUMIN  2.8*    CBC: Recent Labs  Lab 10/25/23 1123 10/26/23 0338 10/27/23 0820 10/27/23 1455 10/28/23 0358  WBC 5.0 4.5 5.3 6.0 7.0  NEUTROABS 3.5  --   --   --   --   HGB 10.9* 10.3* 11.6* 10.6* 10.8*  HCT 33.3* 31.2* 35.9* 33.4* 33.3*  MCV 86.9 86.0 86.1 89.5 86.7  PLT 145* 122* 159 132* 157    CBG: Recent Labs  Lab 10/26/23 0518 10/26/23 1101 10/26/23 1344 10/26/23 1442  GLUCAP 64* 74 69* 85     Medications:  sodium chloride      piperacillin -tazobactam (ZOSYN )  IV 2.25 g (10/28/23 1352)   vancomycin  Stopped (10/28/23 0019)    aspirin  EC  81 mg Oral Daily   atorvastatin   10 mg Oral Daily   calcium  acetate  2,001 mg Oral TID WC   Chlorhexidine  Gluconate Cloth  6 each Topical Q0600   ferrous sulfate   325 mg Oral Q breakfast    heparin   5,000 Units Subcutaneous Q8H   irbesartan   300 mg Oral Daily   metoprolol  succinate  75 mg Oral Daily   mupirocin  ointment  1 Application Nasal BID   sodium bicarbonate   650 mg Oral BID   sodium chloride  flush  3 mL Intravenous Q12H    Dialysis Orders: Center: [facility]  on MWF . EDW 84kg HD Bath 2K/2.5Ca  Time 4:00 Heparin  1000 bolus then 600 units/hr. 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  To have angiogram today per [facility].  Currently on heparin  drip.  ABI revealed absent right toe-brachial index. Left lower extremity cellulitis - started on IV vanco and zosyn .    ESRD -  continue with HD on MWF schedule  Hypertension/volume  -  stable, no evidence of volume overload.  He is below his EDW.  Anemia  - stable, due to ESA this week  Metabolic bone disease -  continue with home meds  Nutrition -  renal diet   Belvie Och, NP 10/28/2023, 4:52 PM  [facility]

## 2023-10-28 NOTE — Consult Note (Signed)
 Reason for Consult:Left calf ulcer Referring Physician: Brigida Bureau Time called: [id number] Time at bedside: 1012   Jacob Daniel is an 68 y.o. male.  HPI: [name] was admitted with right foot pain and has undergone vascular workup. He has a left calf ulceration that's been present for over a month and orthopedic surgery was consulted for that. He notes that it bothers him quite a bit but denies drainage.  Past Medical History:  Diagnosis Date   Arthritis    CHF (congestive heart failure) (HCC)    Diabetes mellitus without complication (HCC)    ESRD on hemodialysis (HCC)    Hypertension     Past Surgical History:  Procedure Laterality Date   ABDOMINAL AORTOGRAM W/LOWER EXTREMITY N/A 10/27/2023   Procedure: ABDOMINAL AORTOGRAM W/LOWER EXTREMITY;  Surgeon: Lanis Fonda BRAVO, MD;  Location: [facility] INVASIVE CV LAB;  Service: Cardiovascular;  Laterality: N/A;   IR PARACENTESIS  05/06/2023   KIDNEY TRANSPLANT  2010   LOWER EXTREMITY INTERVENTION Right 10/27/2023   Procedure: LOWER EXTREMITY INTERVENTION;  Surgeon: Lanis Fonda BRAVO, MD;  Location: [facility] INVASIVE CV LAB;  Service: Cardiovascular;  Laterality: Right;   RIGHT/LEFT HEART CATH AND CORONARY ANGIOGRAPHY N/A 04/08/2023   Procedure: RIGHT/LEFT HEART CATH AND CORONARY ANGIOGRAPHY;  Surgeon: Ladona Heinz, MD;  Location: [facility] INVASIVE CV LAB;  Service: Cardiovascular;  Laterality: N/A;   SPINE SURGERY      History reviewed. No pertinent family history.  Social History:  reports that he has never smoked. He has never used smokeless tobacco. He reports that he does not currently use alcohol after a past usage of about 1.0 standard drink of alcohol per week. He reports current drug use. Drug: Marijuana.  Allergies: No Known Allergies  Medications: I have reviewed the patient's current medications.  Results for orders placed or performed during the hospital encounter of 10/25/23 (from the past 48 hours)  Glucose, capillary     Status: None   Collection  Time: 10/26/23 11:01 AM  Result Value Ref Range   Glucose-Capillary 74 70 - 99 mg/dL    Comment: Glucose reference range applies only to samples taken after fasting for at least 8 hours.  Heparin  level (unfractionated)     Status: Abnormal   Collection Time: 10/26/23 12:35 PM  Result Value Ref Range   Heparin  Unfractionated 0.23 (L) 0.30 - 0.70 IU/mL    Comment: (NOTE) The clinical reportable range upper limit is being lowered to >1.10 to align with the FDA approved guidance for the current laboratory assay.  If heparin  results are below expected values, and patient dosage has  been confirmed, suggest follow up testing of antithrombin III levels. Performed at [facility], [address], [place], [place] [address]   Glucose, capillary     Status: Abnormal   Collection Time: 10/26/23  1:44 PM  Result Value Ref Range   Glucose-Capillary 69 (L) 70 - 99 mg/dL    Comment: Glucose reference range applies only to samples taken after fasting for at least 8 hours.  Glucose, capillary     Status: None   Collection Time: 10/26/23  2:42 PM  Result Value Ref Range   Glucose-Capillary 85 70 - 99 mg/dL    Comment: Glucose reference range applies only to samples taken after fasting for at least 8 hours.  Heparin  level (unfractionated)     Status: Abnormal   Collection Time: 10/26/23 10:27 PM  Result Value Ref Range   Heparin  Unfractionated 0.21 (L) 0.30 - 0.70  IU/mL    Comment: (NOTE) The clinical reportable range upper limit is being lowered to >1.10 to align with the FDA approved guidance for the current laboratory assay.  If heparin  results are below expected values, and patient dosage has  been confirmed, suggest follow up testing of antithrombin III levels. Performed at [facility], [address], [place], [place] [address]   Heparin  level (unfractionated)     Status: None   Collection Time: 10/27/23  8:20 AM  Result Value Ref Range   Heparin  Unfractionated 0.35 0.30  - 0.70 IU/mL    Comment: (NOTE) The clinical reportable range upper limit is being lowered to >1.10 to align with the FDA approved guidance for the current laboratory assay.  If heparin  results are below expected values, and patient dosage has  been confirmed, suggest follow up testing of antithrombin III levels. Performed at [facility], [address], [place], [place] [address]   Basic metabolic panel with GFR     Status: Abnormal   Collection Time: 10/27/23  8:20 AM  Result Value Ref Range   Sodium 135 135 - 145 mmol/L   Potassium 4.5 3.5 - 5.1 mmol/L   Chloride 92 (L) 98 - 111 mmol/L   CO2 24 22 - 32 mmol/L   Glucose, Bld 59 (L) 70 - 99 mg/dL    Comment: Glucose reference range applies only to samples taken after fasting for at least 8 hours.   BUN 52 (H) 8 - 23 mg/dL   Creatinine, Ser 1.53 (H) 0.61 - 1.24 mg/dL   Calcium  9.6 8.9 - 10.3 mg/dL   GFR, Estimated 6 (L) >60 mL/min    Comment: (NOTE) Calculated using the CKD-EPI Creatinine Equation (2021)    Anion gap 19 (H) 5 - 15    Comment: Performed at [facility], [address], [place], [place] [address]  CBC     Status: Abnormal   Collection Time: 10/27/23  8:20 AM  Result Value Ref Range   WBC 5.3 4.0 - 10.5 K/uL   RBC 4.17 (L) 4.22 - 5.81 MIL/uL   Hemoglobin 11.6 (L) 13.0 - 17.0 g/dL   HCT 64.0 (L) 60.9 - 47.9 %   MCV 86.1 80.0 - 100.0 fL   MCH 27.8 26.0 - 34.0 pg   MCHC 32.3 30.0 - 36.0 g/dL   RDW 83.0 (H) 88.4 - 84.4 %   Platelets 159 150 - 400 K/uL   nRBC 0.0 0.0 - 0.2 %    Comment: Performed at [facility], [address] [place], [place] [address]  CBC     Status: Abnormal   Collection Time: 10/27/23  2:55 PM  Result Value Ref Range   WBC 6.0 4.0 - 10.5 K/uL   RBC 3.73 (L) 4.22 - 5.81 MIL/uL   Hemoglobin 10.6 (L) 13.0 - 17.0 g/dL   HCT 66.5 (L) 60.9 - 47.9 %   MCV 89.5 80.0 - 100.0 fL   MCH 28.4 26.0 - 34.0 pg   MCHC 31.7 30.0 - 36.0 g/dL   RDW 82.8 (H) 88.4 - 84.4 %    Platelets 132 (L) 150 - 400 K/uL   nRBC 0.0 0.0 - 0.2 %    Comment: Performed at [facility], [address] [place], [place] [address]  Creatinine, serum     Status: Abnormal   Collection Time: 10/27/23  2:55 PM  Result Value Ref Range   Creatinine, Ser 8.90 (H) 0.61 -  1.24 mg/dL   GFR, Estimated 6 (L) >60 mL/min    Comment: (NOTE) Calculated using the CKD-EPI Creatinine Equation (2021) Performed at [facility], [address], [place], [place] [address]   Basic metabolic panel with GFR     Status: Abnormal   Collection Time: 10/28/23  3:58 AM  Result Value Ref Range   Sodium 134 (L) 135 - 145 mmol/L   Potassium 3.8 3.5 - 5.1 mmol/L   Chloride 95 (L) 98 - 111 mmol/L   CO2 27 22 - 32 mmol/L   Glucose, Bld 84 70 - 99 mg/dL    Comment: Glucose reference range applies only to samples taken after fasting for at least 8 hours.   BUN 29 (H) 8 - 23 mg/dL   Creatinine, Ser 3.93 (H) 0.61 - 1.24 mg/dL   Calcium  8.8 (L) 8.9 - 10.3 mg/dL   GFR, Estimated 9 (L) >60 mL/min    Comment: (NOTE) Calculated using the CKD-EPI Creatinine Equation (2021)    Anion gap 12 5 - 15    Comment: Performed at [facility], [address], [place], [place] [address]  CBC     Status: Abnormal   Collection Time: 10/28/23  3:58 AM  Result Value Ref Range   WBC 7.0 4.0 - 10.5 K/uL   RBC 3.84 (L) 4.22 - 5.81 MIL/uL   Hemoglobin 10.8 (L) 13.0 - 17.0 g/dL   HCT 66.6 (L) 60.9 - 47.9 %   MCV 86.7 80.0 - 100.0 fL   MCH 28.1 26.0 - 34.0 pg   MCHC 32.4 30.0 - 36.0 g/dL   RDW 83.2 (H) 88.4 - 84.4 %   Platelets 157 150 - 400 K/uL   nRBC 0.3 (H) 0.0 - 0.2 %    Comment: Performed at [facility], [address] [place], [place] [address]  Lipid panel     Status: Abnormal   Collection Time: 10/28/23  3:58 AM  Result Value Ref Range   Cholesterol 104 0 - 200 mg/dL   Triglycerides 48 <849 mg/dL   HDL 36 (L) >59 mg/dL   Total CHOL/HDL Ratio 2.9 RATIO   VLDL 10 0 - 40 mg/dL   LDL  Cholesterol 58 0 - 99 mg/dL    Comment:        Total Cholesterol/HDL:CHD Risk Coronary Heart Disease Risk Table                     Men   Women  1/2 Average Risk   3.4   3.3  Average Risk       5.0   4.4  2 X Average Risk   9.6   7.1  3 X Average Risk  23.4   11.0        Use the calculated Patient Ratio above and the CHD Risk Table to determine the patient's CHD Risk.        ATP III CLASSIFICATION (LDL):  <100     mg/dL   Optimal  899-870  mg/dL   Near or Above                    Optimal  130-159  mg/dL   Borderline  839-810  mg/dL   High  >809     mg/dL   Very High Performed at [facility], [address] [place], [place] [address]     PERIPHERAL VASCULAR CATHETERIZATION Result Date: 10/27/2023 Images from the  original result were not included. Patient name: Jacob Daniel MRN: [id number] DOB: 14-Sep-1955 Sex: male 10/25/2023 - 10/27/2023 Pre-operative Diagnosis: Right lower extremity rest pain Post-operative diagnosis:  Same Surgeon:  Fonda FORBES Rim, MD Procedure Performed: 1.  Ultrasound-guided micropuncture access of the left common femoral artery in retrograde fashion 2.  Aortogram 3.  Second order cannulation, left lower extremity angiogram 4.  Selective angiography of the superficial femoral artery 5.  Selective angiography of the posterior tibial artery 6.  Attempted recanalization of the posterior tibial artery-unsuccessful 7.  Left lower extremity angiogram 8.  Arteriotomy manage with next device Contrast volume 140 cc, sedation time 72 minutes Indications: Patient is a 68 year old male with peripheral arterial disease and right sided rest pain.  He presented to the hospital due to worsening pain in the right toes.  He has a wound on the left calf from a spider bite.  I have discussed the risks and benefits of right lower extremity angiography in an effort to define and improve distal perfusion to alleviate rest pain, Even elected to proceed. Findings: Sluggish flow  throughout indicative of heart failure-known ejection fraction 30 to 35% 6 months ago Aorta: Severe atherosclerotic disease throughout.  No flow-limiting stenosis identified Right leg: Severe atherosclerotic disease throughout.  Widely patent common femoral artery, profunda, superficial femoral artery, popliteal artery.  Distally there is single-vessel peroneal artery outflow filling the foot through collaterals.  The posterior tibial artery has a high takeoff, but occludes.  Severe small vessel disease. Left leg: Severe atherosclerotic disease throughout.  Widely patent common femoral artery, profunda, superficial femoral artery, popliteal artery.  There appears to be three-vessels opacifying in the calf, however distally contrast washout due to heart failure, and distal tibial vessels and pedal vessels were not visualized.  Procedure:  The patient was identified in the holding area and taken to room 8.  The patient was then placed supine on the table and prepped and draped in the usual sterile fashion.  A time out was called.  Moderate sedation was administered using Fentanyl  and Versed .  Ultrasound was used to evaluate the left common femoral artery.  It was patent.  A digital ultrasound image was acquired.  A micropuncture needle was used to access the left common femoral artery under ultrasound guidance.  An 018 wire was advanced without resistance and a micropuncture sheath was placed.  The 018 wire was removed and a benson wire was placed.  The micropuncture sheath was exchanged for a 5 french sheath.  An omniflush catheter was advanced over the wire to the level of L-1.  An abdominal angiogram was obtained.  Next, using the omniflush catheter and a benson wire, the aortic bifurcation was crossed and the catheter was placed into theright external iliac artery and right runoff was obtained.  left runoff was performed via retrograde sheath injections. See results above. I elected to attempt intervention on the  posterior tibial artery as it appeared to fill in very small islands from collaterals.  A 6 x 60 cm sheath was brought into the field and parked in the right superficial femoral artery.  The patient was heparinized.  A series of wires and catheters were used to navigate into the high takeoff of the posterior tibial artery.  A series of wires and catheters were used including 0.014 wires, however I was unable to cross the occlusion.  I elected not to attempt retrograde intervention as outflow was poor.  I closed the percutaneous access site with a Mynx  device.  The patient became very aggravated and angry causing the Mynx device to fail and manual pressure was held.  The patient was administered another 1 mg of Versed  to allow for safe arteriotomy management. Impression: Unsuccessful attempt at recanalization of the right posterior tibial artery.  Severe microvascular disease.  Patient is maximally revascularized with no further options.  Pain is multifactorial from sluggish flow due to heart failure and severe peripheral arterial, and microvascular disease. No wounds or rest pain in the right leg.  Patient would benefit from orthopedic surgery evaluation, likely debridement.  Should wound healing be a problem, would happily perform diagnostic angiography with possible intervention on the left leg. Fonda FORBES Rim MD [facility] Office: [phone]    Review of Systems  HENT:  Negative for ear discharge, ear pain, hearing loss and tinnitus.   Eyes:  Negative for photophobia and pain.  Respiratory:  Negative for cough and shortness of breath.   Cardiovascular:  Negative for chest pain.  Gastrointestinal:  Negative for abdominal pain, nausea and vomiting.  Genitourinary:  Negative for dysuria, flank pain, frequency and urgency.  Musculoskeletal:  Positive for arthralgias (Right 2nd toe, left calf). Negative for back pain, myalgias and neck pain.  Neurological:  Negative for  dizziness and headaches.  Hematological:  Does not bruise/bleed easily.  Psychiatric/Behavioral:  The patient is not nervous/anxious.    Blood pressure 120/82, pulse (!) 57, temperature 97.6 F (36.4 C), temperature source Oral, resp. rate 13, height 6' 1 (1.854 m), weight 78.9 kg, SpO2 92%. Physical Exam Constitutional:      General: He is not in acute distress.    Appearance: He is well-developed. He is not diaphoretic.  HENT:     Head: Normocephalic and atraumatic.  Eyes:     General: No scleral icterus.       Right eye: No discharge.        Left eye: No discharge.     Conjunctiva/sclera: Conjunctivae normal.  Cardiovascular:     Rate and Rhythm: Normal rate and regular rhythm.  Pulmonary:     Effort: Pulmonary effort is normal. No respiratory distress.  Musculoskeletal:     Cervical back: Normal range of motion.     Comments: LLE No traumatic wounds, ecchymosis, or rash  Post calf ulceration with firm eschar, no odor or discharge, mild TTP  No knee or ankle effusion  Knee stable to varus/ valgus and anterior/posterior stress  Sens DPN, SPN, TN intact  Motor EHL, ext, flex, evers 5/5  DP 0, PT 0, No significant edema  Skin:    General: Skin is warm and dry.  Neurological:     Mental Status: He is alert.  Psychiatric:        Mood and Affect: Mood normal.        Behavior: Behavior normal.     Assessment/Plan: Left calf ulceration -- Ulcer appears stable and not acutely infected. He may f/u with Dr. Harden upon discharge or will have him seen late next week if still an inpatient at that time.     Ozell DOROTHA Ned, PA-C Orthopedic Surgery [phone] 10/28/2023, 10:16 AM

## 2023-10-29 ENCOUNTER — Encounter (HOSPITAL_COMMUNITY): Payer: Self-pay | Admitting: Internal Medicine

## 2023-10-29 ENCOUNTER — Encounter (HOSPITAL_COMMUNITY)
Admission: EM | Disposition: A | Discharge status: Skilled Nursing Facility | Payer: Self-pay | Source: Home / Self Care | Attending: Internal Medicine

## 2023-10-29 ENCOUNTER — Other Ambulatory Visit: Payer: Self-pay

## 2023-10-29 ENCOUNTER — Inpatient Hospital Stay (HOSPITAL_COMMUNITY): Admitting: Anesthesiology

## 2023-10-29 DIAGNOSIS — I132 Hypertensive heart and chronic kidney disease with heart failure and with stage 5 chronic kidney disease, or end stage renal disease: Secondary | ICD-10-CM | POA: Diagnosis not present

## 2023-10-29 DIAGNOSIS — I5023 Acute on chronic systolic (congestive) heart failure: Secondary | ICD-10-CM

## 2023-10-29 DIAGNOSIS — I70221 Atherosclerosis of native arteries of extremities with rest pain, right leg: Secondary | ICD-10-CM

## 2023-10-29 DIAGNOSIS — N186 End stage renal disease: Secondary | ICD-10-CM

## 2023-10-29 DIAGNOSIS — I70201 Unspecified atherosclerosis of native arteries of extremities, right leg: Secondary | ICD-10-CM | POA: Diagnosis not present

## 2023-10-29 HISTORY — PX: AMPUTATION: SHX166

## 2023-10-29 LAB — BASIC METABOLIC PANEL WITH GFR
Anion gap: 14 (ref 5–15)
BUN: 35 mg/dL — ABNORMAL HIGH (ref 8–23)
CO2: 26 mmol/L (ref 22–32)
Calcium: 9.1 mg/dL (ref 8.9–10.3)
Chloride: 94 mmol/L — ABNORMAL LOW (ref 98–111)
Creatinine, Ser: 7.78 mg/dL — ABNORMAL HIGH (ref 0.61–1.24)
GFR, Estimated: 7 mL/min — ABNORMAL LOW (ref >60)
Glucose, Bld: 69 mg/dL — ABNORMAL LOW (ref 70–99)
Potassium: 4 mmol/L (ref 3.5–5.1)
Sodium: 134 mmol/L — ABNORMAL LOW (ref 135–145)

## 2023-10-29 LAB — CBC
HCT: 31.8 % — ABNORMAL LOW (ref 39.0–52.0)
Hemoglobin: 10.2 g/dL — ABNORMAL LOW (ref 13.0–17.0)
MCH: 28.1 pg (ref 26.0–34.0)
MCHC: 32.1 g/dL (ref 30.0–36.0)
MCV: 87.6 fL (ref 80.0–100.0)
Platelets: 141 K/uL — ABNORMAL LOW (ref 150–400)
RBC: 3.63 MIL/uL — ABNORMAL LOW (ref 4.22–5.81)
RDW: 16.8 % — ABNORMAL HIGH (ref 11.5–15.5)
WBC: 7 K/uL (ref 4.0–10.5)
nRBC: 0 % (ref 0.0–0.2)

## 2023-10-29 LAB — GLUCOSE, CAPILLARY
Glucose-Capillary: 68 mg/dL — ABNORMAL LOW (ref 70–99)
Glucose-Capillary: 126 mg/dL — ABNORMAL HIGH (ref 70–99)
Glucose-Capillary: 74 mg/dL (ref 70–99)

## 2023-10-29 SURGERY — AMPUTATION BELOW KNEE
Anesthesia: Monitor Anesthesia Care | Site: Knee | Laterality: Right

## 2023-10-29 MED ORDER — DEXTROSE 50 % IV SOLN
12.5000 g | INTRAVENOUS | Status: AC
  Administered 2023-10-29: 12.5 g via INTRAVENOUS
  Filled 2023-10-29: qty 50

## 2023-10-29 MED ORDER — LIDOCAINE 2% (20 MG/ML) 5 ML SYRINGE
INTRAMUSCULAR | Status: DC | PRN
  Administered 2023-10-29: 20 mg via INTRAVENOUS

## 2023-10-29 MED ORDER — MIDAZOLAM HCL 2 MG/2ML IJ SOLN
INTRAMUSCULAR | Status: AC
  Filled 2023-10-29: qty 2

## 2023-10-29 MED ORDER — FENTANYL CITRATE (PF) 100 MCG/2ML IJ SOLN: 100.0000 ug | Freq: Once | INTRAMUSCULAR | Status: AC

## 2023-10-29 MED ORDER — CHLORHEXIDINE GLUCONATE CLOTH 2 % EX PADS
6.0000 | MEDICATED_PAD | Freq: Every day | CUTANEOUS | Status: DC
  Administered 2023-10-30 – 2023-10-31 (×2): 6 via TOPICAL

## 2023-10-29 MED ORDER — LIDOCAINE 2% (20 MG/ML) 5 ML SYRINGE
INTRAMUSCULAR | Status: AC
  Filled 2023-10-29: qty 5

## 2023-10-29 MED ORDER — VANCOMYCIN HCL IN DEXTROSE 1-5 GM/200ML-% IV SOLN
1000.0000 mg | Freq: Once | INTRAVENOUS | Status: AC
  Administered 2023-10-30: 1000 mg via INTRAVENOUS

## 2023-10-29 MED ORDER — VANCOMYCIN HCL 1000 MG/200ML IV SOLN: 1000.0000 mg | Freq: Two times a day (BID) | INTRAVENOUS | Status: DC

## 2023-10-29 MED ORDER — FENTANYL CITRATE (PF) 100 MCG/2ML IJ SOLN
INTRAMUSCULAR | Status: AC
  Administered 2023-10-29: 100 ug via INTRAVENOUS
  Filled 2023-10-29: qty 2

## 2023-10-29 MED ORDER — HEMOSTATIC AGENTS (NO CHARGE) OPTIME
TOPICAL | Status: DC | PRN
  Administered 2023-10-29: 1 via TOPICAL

## 2023-10-29 MED ORDER — BUPIVACAINE LIPOSOME 1.3 % IJ SUSP
INTRAMUSCULAR | Status: DC | PRN
  Administered 2023-10-29: 10 mL via PERINEURAL

## 2023-10-29 MED ORDER — CHLORHEXIDINE GLUCONATE 0.12 % MT SOLN
15.0000 mL | Freq: Once | OROMUCOSAL | Status: AC
  Administered 2023-10-29: 15 mL via OROMUCOSAL
  Filled 2023-10-29: qty 15

## 2023-10-29 MED ORDER — PROPOFOL 10 MG/ML IV BOLUS
INTRAVENOUS | Status: DC | PRN
  Administered 2023-10-29: 30 mg via INTRAVENOUS
  Administered 2023-10-29: 20 mg via INTRAVENOUS
  Administered 2023-10-29: 100 ug/kg/min via INTRAVENOUS
  Administered 2023-10-29 (×2): 20 mg via INTRAVENOUS

## 2023-10-29 MED ORDER — FENTANYL CITRATE (PF) 250 MCG/5ML IJ SOLN
INTRAMUSCULAR | Status: AC
  Filled 2023-10-29: qty 5

## 2023-10-29 MED ORDER — EPHEDRINE SULFATE-NACL 50-0.9 MG/10ML-% IV SOSY
PREFILLED_SYRINGE | INTRAVENOUS | Status: DC | PRN
  Administered 2023-10-29: 10 mg via INTRAVENOUS

## 2023-10-29 MED ORDER — BUPIVACAINE-EPINEPHRINE (PF) 0.5% -1:200000 IJ SOLN
INTRAMUSCULAR | Status: DC | PRN
  Administered 2023-10-29: 20 mL via PERINEURAL
  Administered 2023-10-29: 15 mL via PERINEURAL

## 2023-10-29 MED ORDER — 0.9 % SODIUM CHLORIDE (POUR BTL) OPTIME
TOPICAL | Status: DC | PRN
  Administered 2023-10-29: 1000 mL

## 2023-10-29 MED ORDER — HEPARIN SODIUM (PORCINE) 5000 UNIT/ML IJ SOLN
5000.0000 [IU] | Freq: Three times a day (TID) | INTRAMUSCULAR | Status: DC
  Administered 2023-10-30 – 2023-11-01 (×6): 5000 [IU] via SUBCUTANEOUS
  Filled 2023-10-29 (×6): qty 1

## 2023-10-29 MED ORDER — ORAL CARE MOUTH RINSE: 15.0000 mL | Freq: Once | OROMUCOSAL | Status: AC

## 2023-10-29 MED ORDER — SODIUM CHLORIDE 0.9 % IV SOLN: INTRAVENOUS | Status: DC

## 2023-10-29 MED ORDER — PROPOFOL 10 MG/ML IV BOLUS
INTRAVENOUS | Status: AC
Start: 2023-10-29 — End: 2023-10-29
  Filled 2023-10-29: qty 20

## 2023-10-29 MED FILL — Ondansetron HCl Inj 4 MG/2ML (2 MG/ML): INTRAMUSCULAR | Qty: 2 | Status: AC

## 2023-10-29 MED FILL — Lidocaine HCl Local Preservative Free (PF) Inj 1%: INTRAMUSCULAR | Qty: 30 | Status: AC

## 2023-10-29 SURGICAL SUPPLY — 45 items
BAG COUNTER SPONGE SURGICOUNT (BAG) ×1 IMPLANT
BANDAGE ESMARK 6X9 LF (GAUZE/BANDAGES/DRESSINGS) IMPLANT
BLADE SAW SGTL 73X25 THK (BLADE) ×1 IMPLANT
BNDG COHESIVE 6X5 TAN ST LF (GAUZE/BANDAGES/DRESSINGS) ×1 IMPLANT
BNDG ELASTIC 4X5.8 VLCR STR LF (GAUZE/BANDAGES/DRESSINGS) ×1 IMPLANT
BNDG ELASTIC 6INX 5YD STR LF (GAUZE/BANDAGES/DRESSINGS) ×1 IMPLANT
BNDG GAUZE DERMACEA FLUFF 4 (GAUZE/BANDAGES/DRESSINGS) ×2 IMPLANT
CANISTER SUCTION 3000ML PPV (SUCTIONS) ×1 IMPLANT
CLIP TI LARGE 6 (CLIP) IMPLANT
CLIP TI MEDIUM 6 (CLIP) ×1 IMPLANT
CLIP TI WIDE RED SMALL 6 (CLIP) ×1 IMPLANT
COVER BACK TABLE 60X90IN (DRAPES) IMPLANT
COVER SURGICAL LIGHT HANDLE (MISCELLANEOUS) ×1 IMPLANT
DRAIN CHANNEL 19F RND (DRAIN) IMPLANT
DRAPE HALF SHEET 40X57 (DRAPES) ×1 IMPLANT
DRAPE INCISE 23X17 STRL (DRAPES) ×1 IMPLANT
DRAPE INCISE IOBAN 66X45 STRL (DRAPES) ×1 IMPLANT
DRAPE SURG ORHT 6 SPLT 77X108 (DRAPES) ×2 IMPLANT
ELECTRODE REM PT RTRN 9FT ADLT (ELECTROSURGICAL) ×1 IMPLANT
EVACUATOR SILICONE 100CC (DRAIN) IMPLANT
GAUZE PAD ABD 8X10 STRL (GAUZE/BANDAGES/DRESSINGS) ×1 IMPLANT
GAUZE SPONGE 4X4 12PLY STRL (GAUZE/BANDAGES/DRESSINGS) ×2 IMPLANT
GAUZE XEROFORM 1X8 LF (GAUZE/BANDAGES/DRESSINGS) ×1 IMPLANT
GAUZE XEROFORM 5X9 LF (GAUZE/BANDAGES/DRESSINGS) IMPLANT
GLOVE BIOGEL PI IND STRL 8 (GLOVE) ×1 IMPLANT
GOWN STRL REUS W/ TWL LRG LVL3 (GOWN DISPOSABLE) ×2 IMPLANT
GOWN STRL REUS W/TWL 2XL LVL3 (GOWN DISPOSABLE) ×2 IMPLANT
KIT BASIN OR (CUSTOM PROCEDURE TRAY) ×1 IMPLANT
KIT TURNOVER KIT B (KITS) ×1 IMPLANT
NS IRRIG 1000ML POUR BTL (IV SOLUTION) ×1 IMPLANT
PACK GENERAL/GYN (CUSTOM PROCEDURE TRAY) ×1 IMPLANT
PAD ARMBOARD POSITIONER FOAM (MISCELLANEOUS) ×2 IMPLANT
STAPLER SKIN PROX 35W (STAPLE) ×1 IMPLANT
STOCKINETTE IMPERVIOUS LG (DRAPES) ×1 IMPLANT
SURGIFLO W/THROMBIN 8M KIT (HEMOSTASIS) IMPLANT
SUT ETHILON 3 0 PS 1 (SUTURE) IMPLANT
SUT SILK 0 TIES 10X30 (SUTURE) IMPLANT
SUT SILK 2 0 SH CR/8 (SUTURE) ×1 IMPLANT
SUT SILK 2-0 18XBRD TIE 12 (SUTURE) ×1 IMPLANT
SUT SILK 3-0 18XBRD TIE 12 (SUTURE) ×1 IMPLANT
SUT VIC AB 2-0 CT1 18 (SUTURE) ×2 IMPLANT
SUT VIC AB 3-0 SH 8-18 (SUTURE) ×1 IMPLANT
TOWEL GREEN STERILE (TOWEL DISPOSABLE) ×2 IMPLANT
UNDERPAD 30X36 HEAVY ABSORB (UNDERPADS AND DIAPERS) ×1 IMPLANT
WATER STERILE IRR 1000ML POUR (IV SOLUTION) ×1 IMPLANT

## 2023-10-29 NOTE — Anesthesia Procedure Notes (Signed)
 Anesthesia Regional Block: Adductor canal block   Pre-Anesthetic Checklist: , timeout performed,  Correct Patient, Correct Site, Correct Laterality,  Correct Procedure, Correct Position, site marked,  Risks and benefits discussed,  Pre-op evaluation,  At surgeon's request and post-op pain management  Laterality: Right  Prep: Maximum Sterile Barrier Precautions used, chloraprep       Needles:  Injection technique: Single-shot  Needle Type: Echogenic Stimulator Needle     Needle Length: 9cm  Needle Gauge: 21     Additional Needles:   Procedures:,,,, ultrasound used (permanent image in chart),,    Narrative:  Start time: 10/29/2023 11:46 AM End time: 10/29/2023 11:51 AM Injection made incrementally with aspirations every 5 mL.  Performed by: Personally  Anesthesiologist: Epifanio Fallow, MD

## 2023-10-29 NOTE — Progress Notes (Signed)
 [facility] Progress Note   Subjective:   Patient seen and examined in PACU. 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 The procedure went well and he is doing well post operatively. He denies any dyspnea or CP.  Next HD 8/15  Objective Vitals:   10/29/23 1150 10/29/23 1155 10/29/23 1200 10/29/23 1522  BP: (!) 140/86 (!) 143/78 (!) (P) 151/100 128/82  Pulse: (!) 58 (!) 55 60 (!) 58  Resp: 18 (!) 25 20 20   Temp:    97.7 F (36.5 C)  TempSrc:      SpO2: 94% (P) 95% 98% 100%  Weight:      Height:       Physical Exam Gen: NAD CVS: bradycardic  Resp:CTA Abd: +BS, soft, NT/ND Ext: LUE AVF +T/B, new right BKA  Additional Objective Labs: Basic Metabolic Panel: Recent Labs  Lab 10/27/23 0820 10/27/23 1455 10/28/23 0358 10/29/23 0330  NA 135  --  134* 134*  K 4.5  --  3.8 4.0  CL 92*  --  95* 94*  CO2 24  --  27 26  GLUCOSE 59*  --  84 69*  BUN 52*  --  29* 35*  CREATININE 8.46* 8.90* 6.06* 7.78*  CALCIUM  9.6  --  8.8* 9.1   Liver Function Tests: Recent Labs  Lab 10/25/23 1123  AST 22  ALT 16  ALKPHOS 76  BILITOT 1.6*  PROT 8.4*  ALBUMIN  2.8*    CBC: Recent Labs  Lab 10/25/23 1123 10/26/23 0338 10/27/23 0820 10/27/23 1455 10/28/23 0358 10/29/23 0330  WBC 5.0 4.5 5.3 6.0 7.0 7.0  NEUTROABS 3.5  --   --   --   --   --   HGB 10.9* 10.3* 11.6* 10.6* 10.8* 10.2*  HCT 33.3* 31.2* 35.9* 33.4* 33.3* 31.8*  MCV 86.9 86.0 86.1 89.5 86.7 87.6  PLT 145* 122* 159 132* 157 141*    CBG: Recent Labs  Lab 10/26/23 1344 10/26/23 1442 10/29/23 1111 10/29/23 1219 10/29/23 1521  GLUCAP 69* 85 68* 126* 74     Medications:  sodium chloride  225 mL/hr at 10/29/23 1524   [MAR Hold] piperacillin -tazobactam (ZOSYN )  IV 2.25 g (10/29/23 0542)   [MAR Hold] vancomycin  Stopped (10/28/23 0019)    [MAR Hold] aspirin  EC  81 mg Oral Daily   [MAR Hold] atorvastatin   10 mg Oral Daily   [MAR Hold] calcium  acetate  2,001 mg Oral TID WC    [MAR Hold] Chlorhexidine  Gluconate Cloth  6 each Topical Q0600   [START ON 10/30/2023] Chlorhexidine  Gluconate Cloth  6 each Topical Q0600   [MAR Hold] ferrous sulfate   325 mg Oral Q breakfast   [MAR Hold] heparin   5,000 Units Subcutaneous Q8H   [MAR Hold] irbesartan   300 mg Oral Daily   [MAR Hold] metoprolol  succinate  75 mg Oral Daily   [MAR Hold] mupirocin  ointment  1 Application Nasal BID   [MAR Hold] sodium bicarbonate   650 mg Oral BID   [MAR Hold] sodium chloride  flush  3 mL Intravenous Q12H    Dialysis Orders: Center: [facility]   on MWF . EDW 84kg HD Bath 2K/2.5Ca  Time 4:00 Heparin  1000 bolus then 600 units/hr. 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  To have angiogram today per [facility].  Currently on heparin  drip.  ABI revealed absent right toe-brachial index. Patient underwent BKA today and is recovering well.  Left lower extremity cellulitis - started on IV vanco and zosyn .    ESRD -  continue with HD on MWF schedule  Hypertension/volume  - stable, no evidence of volume overload.  He is below his EDW.  Anemia  - stable, due to ESA this week  Metabolic bone disease -  continue with home meds  Nutrition -  renal diet   Belvie Och, NP 10/29/2023, 3:57 PM  [facility]

## 2023-10-29 NOTE — Progress Notes (Signed)
 PROGRESS NOTE Jacob Daniel  FMW:[id number] DOB: [birth year] DOA: 10/25/2023 PCP: Pcp, No  Brief Narrative/Hospital Course: The patient is a 68 yr old man who presented to [facility] ED on 10/25/2023 with complaints of right foot pain and swelling x 3 days. About 3 weeks prior to presentation he  was bit by a spider on his left calf seen by PCP, was given 7 days of doxycycline  which he completed without improvement, initially about 2 cm dark area on the posterior left lower leg, he is having pain on rt grt toes for 3 days and swelling pain on rt foot as well and had been getting worse with swelling and pain.   In the ED doppler pulses bilaterally were concerning for limb ischemia. The patient was admitted on heparin . Vascular surgery was consulted. ABI's were ordered and indicated mild right lower extremity arterial disease and incompressible left ABI. The patient was continued on vancomycin  and zosyn . He received pain management.  Vascular surgery has performed lower extremity angiogram on 10/27/2023. It has demonstrated:  Sluggish flow throughout indicative of heart failure-known ejection fraction 30 to 35% 6 months ago Aorta: Severe atherosclerotic disease throughout.  No flow-limiting stenosis identified   Right leg: Severe atherosclerotic disease throughout.  Widely patent common femoral artery, profunda, superficial femoral artery, popliteal artery.  Distally there is single-vessel peroneal artery outflow filling the foot through collaterals.  The posterior tibial artery has a high takeoff, but occludes.  Severe small vessel disease.   Left leg: Severe atherosclerotic disease throughout.  Widely patent common femoral artery, profunda, superficial femoral artery, popliteal artery.  There appears to be three-vessels opacifying in the calf, however distally contrast washout due to heart failure, and distal tibial vessels and pedal vessels were not visualized.  Attempts to recanalize the right posterior  tibial arter were unsuccessful furu to severe microvascular disease. The patient had been maximally revascularized with no further options. His pain was determined to be due to sluggish flow due to heart failure and servere peripheral arterial and microvascular disease.   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 The surgery took place that afternoon. The patient tolerated the procedure well. He went on to dialysis following the surgery  Subjective: The patient is resting comfortably. No new complaints.   Assessment and plan:   LLE cellulitis, non healing wound on left calf -believed to be spider bite   RLE rest pain with thrombosis of the popliteal artery from above the knee, nonopacification of the trifurcation vessels below the knee, with small toe ulceration-concerning for critical limb ischemia with tissue loss: Patient underwent initial CT perfusion scan in the ED showing thrombosis see report.  Vascular consulted and placed on heparin  drip for critical limb ischemia with tissue loss.  Also started on empiric antibiotics for possible cellulitis. Plan is for lower extremity angiogram to improve distal perfusion for wound healing on Wednesday ABI indicates mild right lower extremity arterial disease may be falsely elevated due to arterial wall calcification right TBI is absent left ABI indicates noncompressible arteries left TBI absent  Continue IV vancomycin  and Zosyn , heparin  drip, pain management The patient underwent angiogram with attempted intervention on 10/27/2023. It has demonstrated:  Sluggish flow throughout indicative of heart failure-known ejection fraction 30 to 35% 6 months ago Aorta: Severe atherosclerotic disease throughout.  No flow-limiting stenosis identified   Right leg: Severe atherosclerotic disease throughout.  Widely patent common femoral artery, profunda, superficial femoral artery, popliteal artery.  Distally there  is single-vessel peroneal artery outflow filling the foot through collaterals.  The posterior tibial artery has a high takeoff, but occludes.  Severe small vessel disease.   Left leg: Severe atherosclerotic disease throughout.  Widely patent common femoral artery, profunda, superficial femoral artery, popliteal artery.  There appears to be three-vessels opacifying in the calf, however distally contrast washout due to heart failure, and distal tibial vessels and pedal vessels were not visualized.  Attempts to recanalize the right posterior tibial arter were unsuccessful furu to severe microvascular disease. The patient had been maximally revascularized with no further options. His pain was determined to be due to sluggish flow due to heart failure and servere peripheral arterial and microvascular disease.   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 The surgery took place that afternoon. The patient tolerated the procedure well. He went on to dialysis following the surgery.   Lactic acidosis: Suspect due to patient's leg ischemia versus infection. 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 Cont home PhosLo , ferrous sulfate  Monitor hemoglobin, electrolytes   Hypoalbuminemia Mild hypoglycemia Elevated TB: Augment diet monitor labs.  Monitor blood sugar        Recent Labs  Lab 10/26/23 0518 10/26/23 1101 10/26/23 1344 10/26/23 1442  GLUCAP 64* 74 69* 85     HTN: BP well-controlled on metoprolol  and Avapro    HLD: Continue Lipitor   PAF: PTA on Eliquis  currently on heparin    Chronic systolic CHF/pulmonary hypertension/heart failure Euvolemic, continue to address fluid status with dialysis   CAD No chest pain.   OSA Did not tolerate so not using for few  years   Mobility: Independent   DVT prophylaxis: Heparin  drip Code Status:   Code Status: Full Code Family Communication: plan of care discussed with patient at bedside. Patient status is: Remains hospitalized because of severity of illness Level of care: Progressive    Dispo: The patient is from: home            Anticipated disposition: TBD Data Reviewed: I have personally reviewed following labs and imaging studies ( see epic result tab)  Vitals:   10/29/23 1800 10/29/23 1830  BP: (!) 149/90 126/87  Pulse: (!) 56 (!) 56  Resp: 18 17  Temp:    SpO2: 98% 92%   Exam:  Constitutional:  The patient is awake, alert, and oriented x 3. No acute distress. Eyes:  pupils and irises appear normal Normal lids and conjunctivae ENMT:  grossly normal hearing  Lips appear normal external ears, nose appear normal Oropharynx: mucosa, tongue,posterior pharynx appear normal Neck:  neck appears normal, no masses, normal ROM, supple no thyromegaly Respiratory:  No increased work of breathing. No wheezes, rales, or rhonchi No tactile fremitus Cardiovascular:  Regular rate and rhythm No murmurs, ectopy, or gallups. No lateral PMI. No thrills. Abdomen:  Abdomen is soft, non-tender, non-distended No hernias, masses, or organomegaly Normoactive bowel sounds.  Musculoskeletal:  No cyanosis, clubbing, or edema Skin:  No rashes, lesions, ulcers palpation of skin: no induration or nodules Neurologic:  CN 2-12 intact Sensation all 4 extremities intact Psychiatric:  Mental status Mood, affect appropriate Orientation to person, place, time  judgment and insight appear intact  CBC: Recent Labs  Lab 10/25/23 1123 10/26/23 0338 10/27/23 0820 10/27/23 1455 10/28/23 0358  10/29/23 0330  WBC 5.0 4.5 5.3 6.0 7.0 7.0  NEUTROABS 3.5  --   --   --   --   --   HGB 10.9* 10.3* 11.6* 10.6* 10.8* 10.2*  HCT 33.3* 31.2* 35.9* 33.4* 33.3* 31.8*  MCV 86.9 86.0 86.1 89.5 86.7 87.6  PLT 145*  122* 159 132* 157 141*   CMP: Recent Labs  Lab 10/25/23 1123 10/26/23 0338 10/27/23 0820 10/27/23 1455 10/28/23 0358 10/29/23 0330  NA 135 134* 135  --  134* 134*  K 5.0 3.7 4.5  --  3.8 4.0  CL 96* 94* 92*  --  95* 94*  CO2 23 25 24   --  27 26  GLUCOSE 109* 65* 59*  --  84 69*  BUN 67* 38* 52*  --  29* 35*  CREATININE 9.98* 6.43* 8.46* 8.90* 6.06* 7.78*  CALCIUM  9.5 8.5* 9.6  --  8.8* 9.1   GFR: Estimated Creatinine Clearance: 10.1 mL/min (A) (by C-G formula based on SCr of 7.78 mg/dL (H)). Recent Labs  Lab 10/25/23 1123  AST 22  ALT 16  ALKPHOS 76  BILITOT 1.6*  PROT 8.4*  ALBUMIN  2.8*   No results for input(s): LIPASE, AMYLASE in the last 168 hours. No results for input(s): AMMONIA in the last 168 hours. Coagulation Profile:  Recent Labs  Lab 10/25/23 1123  INR 1.3*   Unresulted Labs (From admission, onward)     Start     Ordered   10/26/23 0500  Basic metabolic panel with GFR  Daily,   R      10/25/23 1637   10/26/23 0500  CBC  Daily,   R      10/25/23 1637   10/26/23 0130  MRSA Next Gen by PCR, Nasal  Once,   R        10/26/23 0129   Signed and Held  Renal function panel  Once,   R        Signed and Held   Signed and Held  CBC  Once,   R        Signed and Held   Signed and Held  Renal function panel  Once,   R        Signed and Held   Signed and Held  CBC  Once,   R        Signed and Held           Antimicrobials/Microbiology: Anti-infectives (From admission, onward)    Start     Dose/Rate Route Frequency Ordered Stop   10/29/23 2200  vancomycin  (VANCOREADY) IVPB 1000 mg/200 mL  Status:  Discontinued        1,000 mg 200 mL/hr over 60 Minutes Intravenous Every 12 hours 10/29/23 1828 10/29/23 1829   10/29/23 2000  vancomycin  (VANCOCIN ) IVPB 1000 mg/200 mL premix        1,000 mg 200 mL/hr over 60 Minutes Intravenous  Once 10/29/23 1831     10/27/23 1200  vancomycin  (VANCOCIN ) IVPB 1000 mg/200 mL premix        1,000 mg 200 mL/hr over 60  Minutes Intravenous Every M-W-F (Hemodialysis) 10/25/23 1659     10/26/23 1600  vancomycin  (VANCOCIN ) IVPB 1000 mg/200 mL premix        1,000 mg 200 mL/hr over 60 Minutes Intravenous  Once 10/26/23 1456 10/26/23 2112   10/25/23 2200  piperacillin -tazobactam (ZOSYN ) IVPB 2.25 g        2.25 g 100 mL/hr over 30 Minutes  Intravenous Every 8 hours 10/25/23 1654     10/25/23 1530  piperacillin -tazobactam (ZOSYN ) IVPB 3.375 g        3.375 g 100 mL/hr over 30 Minutes Intravenous  Once 10/25/23 1515 10/25/23 1628   10/25/23 1530  vancomycin  (VANCOREADY) IVPB 2000 mg/400 mL        2,000 mg 200 mL/hr over 120 Minutes Intravenous  Once 10/25/23 1515 10/25/23 1807         Component Value Date/Time   SDES BLOOD SITE NOT SPECIFIED 10/25/2023 1156   SPECREQUEST  10/25/2023 1156    BOTTLES DRAWN AEROBIC AND ANAEROBIC Blood Culture results may not be optimal due to an inadequate volume of blood received in culture bottles   CULT  10/25/2023 1156    NO GROWTH 4 DAYS Performed at [facility], [address], [place], [place] [address]    REPTSTATUS PENDING 10/25/2023 1156    Procedures: Procedure(s) (LRB): AMPUTATION BELOW KNEE (Right)   Brigida Bureau, MD [facility] 10/29/2023, 7:39 PM

## 2023-10-29 NOTE — Progress Notes (Signed)
 Pt to Hemodialysis with transport staff

## 2023-10-29 NOTE — Progress Notes (Addendum)
  Progress Note    10/29/2023 9:26 AM 2 Days Post-Op  Subjective:  Rest pain R foot   Vitals:   10/28/23 2023 10/28/23 2339  BP: (!) 142/80 (!) 151/93  Pulse: (!) 56 96  Resp: 18 18  Temp:  98.2 F (36.8 C)  SpO2: 93% 93%   Physical Exam: Lungs:  non labored Extremities:  R foot painful to touch Neurologic: A&O  CBC    Component Value Date/Time   WBC 7.0 10/29/2023 0330   RBC 3.63 (L) 10/29/2023 0330   HGB 10.2 (L) 10/29/2023 0330   HCT 31.8 (L) 10/29/2023 0330   PLT 141 (L) 10/29/2023 0330   MCV 87.6 10/29/2023 0330   MCH 28.1 10/29/2023 0330   MCHC 32.1 10/29/2023 0330   RDW 16.8 (H) 10/29/2023 0330   LYMPHSABS 0.7 10/25/2023 1123   MONOABS 0.5 10/25/2023 1123   EOSABS 0.3 10/25/2023 1123   BASOSABS 0.1 10/25/2023 1123    BMET    Component Value Date/Time   NA 134 (L) 10/29/2023 0330   K 4.0 10/29/2023 0330   CL 94 (L) 10/29/2023 0330   CO2 26 10/29/2023 0330   GLUCOSE 69 (L) 10/29/2023 0330   BUN 35 (H) 10/29/2023 0330   CREATININE 7.78 (H) 10/29/2023 0330   CALCIUM  9.1 10/29/2023 0330   GFRNONAA 7 (L) 10/29/2023 0330    INR    Component Value Date/Time   INR 1.3 (H) 10/25/2023 1123     Intake/Output Summary (Last 24 hours) at 10/29/2023 0926 Last data filed at 10/29/2023 0100 Gross per 24 hour  Intake 399.94 ml  Output --  Net 399.94 ml     Assessment/Plan:  68 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  Surgery was discussed in detail with the patient and he is agreeable to proceed.  Continue npo.  Consent ordered   Donnice Sender, PA-C [facility] [phone] 10/29/2023 9:26 AM  VASCULAR STAFF ADDENDUM: I have independently interviewed and examined the patient. I agree with the above.  I had multiple conversations with Alm regarding the rest pain he has in his right leg.  He states he can no longer live like this.  The only option would be above versus below-knee amputation.  They would like to  attempt walking again and understands that there is a lower chance of wound healing with below-knee amputation versus above-knee.  After discussing the risks and benefits of both, he elected to proceed with below-knee amputation.  Fonda FORBES Rim MD [facility] Phone Number: [phone] 10/29/2023 12:07 PM

## 2023-10-29 NOTE — Progress Notes (Signed)
 Pharmacy Antibiotic Note  Jacob Daniel is a 68 y.o. male admitted on 10/25/2023 with LE cellulitis.  Pharmacy has been consulted for Vancomycin  and Zosyn  dosing.  ESRD-HD usually MWF.  HD planned for 8/15. Afebrile, WBC normal, MRSA PCR negative  Plan: Vancomycin  1g IV with each HD (M,W,F) Monitor HD schedule, Cx and clinical progression to narrow Vancomycin  random level as needed  Height: 6' 1 (185.4 cm) Weight: 78.9 kg (173 lb 15.1 oz) IBW/kg (Calculated) : 79.9  Temp (24hrs), Avg:98.2 F (36.8 C), Min:98.2 F (36.8 C), Max:98.2 F (36.8 C)  Recent Labs  Lab 10/25/23 1123 10/25/23 1156 10/25/23 1358 10/25/23 1557 10/25/23 1949 10/26/23 0338 10/27/23 0820 10/27/23 1455 10/28/23 0358 10/29/23 0330  WBC 5.0  --   --   --   --  4.5 5.3 6.0 7.0 7.0  CREATININE 9.98*  --   --   --   --  6.43* 8.46* 8.90* 6.06* 7.78*  LATICACIDVEN 1.8 1.5 3.8* 1.5 1.5  --   --   --   --   --     Estimated Creatinine Clearance: 10.1 mL/min (A) (by C-G formula based on SCr of 7.78 mg/dL (H)).    No Known Allergies  Danney Bungert, PharmD PGY-1 Pharmacy Resident [facility]  10/29/2023 8:59 AM

## 2023-10-29 NOTE — Anesthesia Postprocedure Evaluation (Signed)
 Anesthesia Post Note  Patient: Jacob Daniel  Procedure(s) Performed: AMPUTATION BELOW KNEE (Right: Knee)     Patient location during evaluation: PACU Anesthesia Type: Regional and MAC Level of consciousness: awake and alert Pain management: pain level controlled Vital Signs Assessment: post-procedure vital signs reviewed and stable Respiratory status: spontaneous breathing, nonlabored ventilation and respiratory function stable Cardiovascular status: stable and blood pressure returned to baseline Postop Assessment: no apparent nausea or vomiting Anesthetic complications: no   No notable events documented.  Last Vitals:  Vitals:   10/29/23 1625 10/29/23 1632  BP: (!) 158/96 (!) 160/93  Pulse: (!) 56 (!) 55  Resp: 18 20  Temp:    SpO2: 96% 91%    Last Pain:  Vitals:   10/29/23 1600  TempSrc:   PainSc: 0-No pain                 Tashon Capp,W. EDMOND

## 2023-10-29 NOTE — Transfer of Care (Signed)
 Immediate Anesthesia Transfer of Care Note  Patient: Jacob Daniel  Procedure(s) Performed: AMPUTATION BELOW KNEE (Right: Knee)  Patient Location: PACU  Anesthesia Type:MAC combined with regional for post-op pain  Level of Consciousness: drowsy  Airway & Oxygen Therapy: Patient Spontanous Breathing, Patient connected to face mask oxygen, and oral airway  Post-op Assessment: Report given to RN and Post -op Vital signs reviewed and stable  Post vital signs: Reviewed and stable  Last Vitals:  Vitals Value Taken Time  BP 128/82   Temp 97.7   Pulse 52   Resp 16   SpO2 100     Last Pain:  Vitals:   10/29/23 1112  TempSrc: (P) Oral  PainSc:          Complications: No notable events documented.

## 2023-10-29 NOTE — Anesthesia Procedure Notes (Signed)
 Anesthesia Regional Block: Popliteal block   Pre-Anesthetic Checklist: , timeout performed,  Correct Patient, Correct Site, Correct Laterality,  Correct Procedure, Correct Position, site marked,  Risks and benefits discussed,  Pre-op evaluation,  At surgeon's request and post-op pain management  Laterality: Right  Prep: Maximum Sterile Barrier Precautions used, chloraprep       Needles:  Injection technique: Single-shot  Needle Type: Echogenic Stimulator Needle     Needle Length: 9cm  Needle Gauge: 21     Additional Needles:   Procedures:,,,, ultrasound used (permanent image in chart),,    Narrative:  Start time: 10/29/2023 11:36 AM End time: 10/29/2023 11:46 AM Injection made incrementally with aspirations every 5 mL.  Performed by: Personally  Anesthesiologist: Epifanio Fallow, MD

## 2023-10-29 NOTE — Op Note (Addendum)
    NAME: [name]    MRN: [id number] DOB: Feb 12, 1956    DATE OF OPERATION: 10/29/2023  PREOP DIAGNOSIS:    End stage renal disease  POSTOP DIAGNOSIS:    Same  PROCEDURE:    Right below knee amputation  SURGEON: Fonda FORBES Rim  ASSIST: Sherrilee Holster, PA  ANESTHESIA: General   EBL:  INDICATIONS:    Jacob Daniel is a 68 y.o. male with right lower extremity rest pain.  He went for angiography and had not unreconstructable vascular disease with severe microvascular disease in the foot.  Unfortunately, he had no other option but pain control with use of analgesics versus amputation.  He chose below-knee amputation.  FINDINGS:   Healthy the calf muscle in the right leg  TECHNIQUE:   After informed consent was obtained, the patient was brought to the OR laid in the supine position.  Moderate anesthesia with block was induced, and the patient's right leg was prepped and draped in standard fashion.  Incisions were marked for a below the knee amputation about 10 cm below the tibial tuberosity. The skin, subcutaneous tissue, muscle and fascia were divided with cautery.  A tourniquet was not used for this case, as the patient had known circumferential calcification, and I did not think the tourniquet would work.  Hemostats were applied to vascular structures. Notably the tissues were all viable. The tibia and fibula were dissected free of their attachments. Periosteal elevators were used to dissect these cephalad. The fibula was cut with a rib cutter cephalad to the skin incision. The tibia was transected with an stryker saw. The anterior aspect of the bone was bevelled. All sharp aspects of the bone edge were smoothed with a bone rasp. The posterior flap was cut with an amputation knife. Hemostasis was achieved with 3-0 silk stick ties and electrocautery. The flap was measured and trimmed to fit the anterior skin border. The flap was irrigated with copious warm saline. The  fascia was approximated with interrupted 2-0 vicryl suture. The skin was brought together with staples. A dry sterile dressing was applied.   Anesthesia was successfully terminated. The patient was brought to the recovery room in stable condition.   Given the complexity of the case,  the assistant was necessary in order to expedient the procedure and safely perform the technical aspects of the operation.  The assistant provided traction and countertraction to assist with exposure of the artery proximally and distally.  They assisted with suture ligature of multiple branches.  Their assistance was critical.  Fonda FORBES Rim, MD [facility] DATE OF DICTATION:   10/29/2023

## 2023-10-29 NOTE — Anesthesia Preprocedure Evaluation (Addendum)
 Anesthesia Evaluation  Patient identified by MRN, date of birth, ID band Patient awake    Reviewed: Allergy & Precautions, H&P , NPO status , Patient's Chart, lab work & pertinent test results  Airway Mallampati: II  TM Distance: >3 FB Neck ROM: Full    Dental no notable dental hx. (+) Teeth Intact, Dental Advisory Given   Pulmonary neg pulmonary ROS   Pulmonary exam normal breath sounds clear to auscultation       Cardiovascular hypertension, Pt. on medications and Pt. on home beta blockers + Peripheral Vascular Disease and +CHF   Rhythm:Regular Rate:Normal     Neuro/Psych negative neurological ROS  negative psych ROS   GI/Hepatic negative GI ROS, Neg liver ROS,,,  Endo/Other  diabetes    Renal/GU ESRF and DialysisRenal disease  negative genitourinary   Musculoskeletal  (+) Arthritis , Osteoarthritis,    Abdominal   Peds  Hematology  (+) Blood dyscrasia, anemia   Anesthesia Other Findings   Reproductive/Obstetrics negative OB ROS                              Anesthesia Physical Anesthesia Plan  ASA: 4  Anesthesia Plan: Regional and MAC   Post-op Pain Management: Minimal or no pain anticipated   Induction: Intravenous  PONV Risk Score and Plan: 2 and Propofol  infusion and Ondansetron   Airway Management Planned: Natural Airway and Simple Face Mask  Additional Equipment:   Intra-op Plan:   Post-operative Plan:   Informed Consent: I have reviewed the patients History and Physical, chart, labs and discussed the procedure including the risks, benefits and alternatives for the proposed anesthesia with the patient or authorized representative who has indicated his/her understanding and acceptance.     Dental advisory given  Plan Discussed with: CRNA  Anesthesia Plan Comments:          Anesthesia Quick Evaluation

## 2023-10-30 ENCOUNTER — Encounter (HOSPITAL_COMMUNITY): Payer: Self-pay | Admitting: Vascular Surgery

## 2023-10-30 DIAGNOSIS — Z89511 Acquired absence of right leg below knee: Secondary | ICD-10-CM

## 2023-10-30 DIAGNOSIS — I70201 Unspecified atherosclerosis of native arteries of extremities, right leg: Secondary | ICD-10-CM | POA: Diagnosis not present

## 2023-10-30 DIAGNOSIS — Z9889 Other specified postprocedural states: Secondary | ICD-10-CM

## 2023-10-30 LAB — CBC
HCT: 30.9 % — ABNORMAL LOW (ref 39.0–52.0)
Hemoglobin: 10 g/dL — ABNORMAL LOW (ref 13.0–17.0)
MCH: 28.3 pg (ref 26.0–34.0)
MCHC: 32.4 g/dL (ref 30.0–36.0)
MCV: 87.5 fL (ref 80.0–100.0)
Platelets: 149 K/uL — ABNORMAL LOW (ref 150–400)
RBC: 3.53 MIL/uL — ABNORMAL LOW (ref 4.22–5.81)
RDW: 17.2 % — ABNORMAL HIGH (ref 11.5–15.5)
WBC: 10.2 K/uL (ref 4.0–10.5)
nRBC: 0 % (ref 0.0–0.2)

## 2023-10-30 LAB — BASIC METABOLIC PANEL WITH GFR
Anion gap: 13 (ref 5–15)
BUN: 19 mg/dL (ref 8–23)
CO2: 26 mmol/L (ref 22–32)
Calcium: 8.8 mg/dL — ABNORMAL LOW (ref 8.9–10.3)
Chloride: 94 mmol/L — ABNORMAL LOW (ref 98–111)
Creatinine, Ser: 5.24 mg/dL — ABNORMAL HIGH (ref 0.61–1.24)
GFR, Estimated: 11 mL/min — ABNORMAL LOW (ref >60)
Glucose, Bld: 80 mg/dL (ref 70–99)
Potassium: 3.8 mmol/L (ref 3.5–5.1)
Sodium: 133 mmol/L — ABNORMAL LOW (ref 135–145)

## 2023-10-30 LAB — CULTURE, BLOOD (ROUTINE X 2)
Culture: NO GROWTH
Culture: NO GROWTH
Special Requests: ADEQUATE

## 2023-10-30 MED ORDER — CINACALCET HCL 30 MG PO TABS
30.0000 mg | ORAL_TABLET | ORAL | Status: DC
  Administered 2023-11-03 – 2023-11-08 (×3): 30 mg via ORAL
  Filled 2023-10-30 (×6): qty 1

## 2023-10-30 MED ORDER — VANCOMYCIN HCL IN DEXTROSE 1-5 GM/200ML-% IV SOLN
INTRAVENOUS | Status: AC
  Filled 2023-10-30: qty 200

## 2023-10-30 MED ORDER — CALCITRIOL 0.5 MCG PO CAPS
1.7500 ug | ORAL_CAPSULE | ORAL | Status: DC
  Administered 2023-11-03 – 2023-11-08 (×3): 1.75 ug via ORAL
  Filled 2023-10-30 (×2): qty 7
  Filled 2023-10-30: qty 1
  Filled 2023-10-30: qty 7

## 2023-10-30 NOTE — Progress Notes (Signed)
 Return from Dialysis with staff.

## 2023-10-30 NOTE — Progress Notes (Addendum)
  Progress Note    10/30/2023 8:59 AM 1 Day Post-Op  Subjective:  sleeping    Vitals:   10/30/23 0340 10/30/23 0842  BP: (!) 150/90 132/80  Pulse: (!) 56 (!) 56  Resp: 13 19  Temp: 98.4 F (36.9 C) 99.4 F (37.4 C)  SpO2: 96% 95%    Physical Exam: General:  sleeping comfortably Cardiac:  regular Lungs:  nonlabored Incisions: Right BKA with shrinker sock and occlusive dressing, no signs of bleeding   CBC    Component Value Date/Time   WBC 10.2 10/30/2023 0331   RBC 3.53 (L) 10/30/2023 0331   HGB 10.0 (L) 10/30/2023 0331   HCT 30.9 (L) 10/30/2023 0331   PLT 149 (L) 10/30/2023 0331   MCV 87.5 10/30/2023 0331   MCH 28.3 10/30/2023 0331   MCHC 32.4 10/30/2023 0331   RDW 17.2 (H) 10/30/2023 0331   LYMPHSABS 0.7 10/25/2023 1123   MONOABS 0.5 10/25/2023 1123   EOSABS 0.3 10/25/2023 1123   BASOSABS 0.1 10/25/2023 1123    BMET    Component Value Date/Time   NA 133 (L) 10/30/2023 0331   K 3.8 10/30/2023 0331   CL 94 (L) 10/30/2023 0331   CO2 26 10/30/2023 0331   GLUCOSE 80 10/30/2023 0331   BUN 19 10/30/2023 0331   CREATININE 5.24 (H) 10/30/2023 0331   CALCIUM  8.8 (L) 10/30/2023 0331   GFRNONAA 11 (L) 10/30/2023 0331    INR    Component Value Date/Time   INR 1.3 (H) 10/25/2023 1123     Intake/Output Summary (Last 24 hours) at 10/30/2023 0859 Last data filed at 10/30/2023 0500 Gross per 24 hour  Intake 947.22 ml  Output 2400 ml  Net -1452.78 ml      Assessment/Plan:  68 y.o. male is 1 day postop, s/p: right BKA  -He is resting comfortably this morning without any issues overnight -Hemoglobin is stable at 10 without further signs of blood loss -Right BKA with occlusive dressing without signs of hematoma -Will plan for dressing takedown in several days.  Okay to mobilize with PT/OT  Ahmed Holster, PA-C [facility] [phone] 10/30/2023 8:59 AM  I have independently interviewed and examined patient and agree with PA  assessment and plan above.   Kashena Novitski C. Sheree, MD [facility] [place] Office: [phone] Pager: [phone]

## 2023-10-30 NOTE — Progress Notes (Signed)
 [facility] Progress Note   Subjective:   Patient seen and examined at bedside.  Sleepy today, completed dialysis overnight.  Wakes briefly and nods to answer questions then returns to sleep.    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, no LE edema Dialysis Access: LU AVF +b/t   Filed Weights   10/29/23 2226 10/30/23 0242 10/30/23 0256  Weight: 80.8 kg 76.8 kg 76.8 kg    Intake/Output Summary (Last 24 hours) at 10/30/2023 1153 Last data filed at 10/30/2023 0500 Gross per 24 hour  Intake 947.22 ml  Output 2400 ml  Net -1452.78 ml    Additional Objective Labs: Basic Metabolic Panel: Recent Labs  Lab 10/28/23 0358 10/29/23 0330 10/30/23 0331  NA 134* 134* 133*  K 3.8 4.0 3.8  CL 95* 94* 94*  CO2 27 26 26   GLUCOSE 84 69* 80  BUN 29* 35* 19  CREATININE 6.06* 7.78* 5.24*  CALCIUM  8.8* 9.1 8.8*   Liver Function Tests: Recent Labs  Lab 10/25/23 1123  AST 22  ALT 16  ALKPHOS 76  BILITOT 1.6*  PROT 8.4*  ALBUMIN  2.8*    CBC: Recent Labs  Lab 10/25/23 1123 10/26/23 0338 10/27/23 0820 10/27/23 1455 10/28/23 0358 10/29/23 0330 10/30/23 0331  WBC 5.0   < > 5.3 6.0 7.0 7.0 10.2  NEUTROABS 3.5  --   --   --   --   --   --   HGB 10.9*   < > 11.6* 10.6* 10.8* 10.2* 10.0*  HCT 33.3*   < > 35.9* 33.4* 33.3* 31.8* 30.9*  MCV 86.9   < > 86.1 89.5 86.7 87.6 87.5  PLT 145*   < > 159 132* 157 141* 149*   < > = values in this interval not displayed.   Blood Culture    Component Value Date/Time   SDES BLOOD SITE NOT SPECIFIED  10/25/2023 1156   SPECREQUEST  10/25/2023 1156    BOTTLES DRAWN AEROBIC AND ANAEROBIC Blood Culture results may not be optimal due to an inadequate volume of blood received in culture bottles   CULT  10/25/2023 1156    NO GROWTH 5 DAYS Performed at [facility], [address] [place], [place] [address]    REPTSTATUS 10/30/2023 FINAL 10/25/2023 1156     Medications:  piperacillin -tazobactam (ZOSYN )  IV 2.25 g (10/30/23 0543)   vancomycin  Stopped (10/28/23 0019)    aspirin  EC  81 mg Oral Daily   atorvastatin   10 mg Oral Daily   calcium  acetate  2,001 mg Oral TID WC   Chlorhexidine  Gluconate Cloth  6 each Topical Q0600   Chlorhexidine  Gluconate Cloth  6 each Topical Q0600   ferrous sulfate   325 mg Oral Q breakfast   heparin   5,000 Units Subcutaneous Q8H   irbesartan   300 mg Oral Daily   metoprolol  succinate  75 mg Oral Daily   mupirocin  ointment  1 Application Nasal BID   sodium bicarbonate   650 mg Oral BID   sodium chloride  flush  3 mL Intravenous Q12H    Dialysis Orders: [facility]  on MWF . EDW 84kg HD Bath 2K/2.5Ca  Time 4:00 Heparin  1000 bolus then 600 units/hr. 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  Angiogram with intervention on 10/27/23.  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  VVS following.   Left lower extremity cellulitis - started on IV vanco and zosyn .    ESRD -  continue with HD on MWF schedule.  Next HD on 11/01/23.   Hypertension/volume  - stable, no evidence of volume overload.  Will need lower EDW on d/c post BKA.  BP in goal.   Anemia  - stable, Hgb 10.0 this AM. Hold ESA for now.   Metabolic bone disease - Calcium  in goal. Check phos.  Continue VDRA, sensipar  and binders.   Nutrition -  renal diet w/fluid restrictions.   Manuelita Labella,  PA-C [facility] 10/30/2023,11:53 AM  LOS: 5 days

## 2023-10-30 NOTE — Progress Notes (Addendum)
 Patient refused all scheduled morning medications. PRN pain meds given. Dr. Soledad made aware. Vital signs within normal limits.    2:49pm  Pt refused tele monitoring for short period of time. Pt is now complaint with tele monitoring now.

## 2023-10-30 NOTE — Progress Notes (Signed)
 PROGRESS NOTE Jacob Daniel  FMW:[id number] DOB: 1955/12/27 DOA: 10/25/2023 PCP: Pcp, No  Brief Narrative/Hospital Course: The patient is a 68 yr old man who presented to [facility] ED on 10/25/2023 with complaints of right foot pain and swelling x 3 days. About 3 weeks prior to presentation he  was bit by a spider on his left calf seen by PCP, was given 7 days of doxycycline  which he completed without improvement, initially about 2 cm dark area on the posterior left lower leg, he is having pain on rt grt toes for 3 days and swelling pain on rt foot as well and had been getting worse with swelling and pain.   In the ED doppler pulses bilaterally were concerning for limb ischemia. The patient was admitted on heparin . Vascular surgery was consulted. ABI's were ordered and indicated mild right lower extremity arterial disease and incompressible left ABI. The patient was continued on vancomycin  and zosyn . He received pain management.  Vascular surgery has performed lower extremity angiogram on 10/27/2023. It has demonstrated:  Sluggish flow throughout indicative of heart failure-known ejection fraction 30 to 35% 6 months ago Aorta: Severe atherosclerotic disease throughout.  No flow-limiting stenosis identified   Right leg: Severe atherosclerotic disease throughout.  Widely patent common femoral artery, profunda, superficial femoral artery, popliteal artery.  Distally there is single-vessel peroneal artery outflow filling the foot through collaterals.  The posterior tibial artery has a high takeoff, but occludes.  Severe small vessel disease.   Left leg: Severe atherosclerotic disease throughout.  Widely patent common femoral artery, profunda, superficial femoral artery, popliteal artery.  There appears to be three-vessels opacifying in the calf, however distally contrast washout due to heart failure, and distal tibial vessels and pedal vessels were not visualized.  Attempts to recanalize the right posterior  tibial arter were unsuccessful furu to severe microvascular disease. The patient had been maximally revascularized with no further options. His pain was determined to be due to sluggish flow due to heart failure and servere peripheral arterial and microvascular disease.   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 The surgery took place that afternoon. The patient tolerated the procedure well. He went on to dialysis following the surgery  Subjective: The patient is resting comfortably. No new complaints.   Assessment and plan:   LLE cellulitis, non healing wound on left calf -believed to be spider bite   RLE rest pain with thrombosis of the popliteal artery from above the knee, nonopacification of the trifurcation vessels below the knee, with small toe ulceration-concerning for critical limb ischemia with tissue loss: Patient underwent initial CT perfusion scan in the ED showing thrombosis see report.  Vascular consulted and placed on heparin  drip for critical limb ischemia with tissue loss.  Also started on empiric antibiotics for possible cellulitis. Plan is for lower extremity angiogram to improve distal perfusion for wound healing on Wednesday ABI indicates mild right lower extremity arterial disease may be falsely elevated due to arterial wall calcification right TBI is absent left ABI indicates noncompressible arteries left TBI absent  Continue IV vancomycin  and Zosyn , heparin  drip, pain management The patient underwent angiogram with attempted intervention on 10/27/2023. It has demonstrated:  Sluggish flow throughout indicative of heart failure-known ejection fraction 30 to 35% 6 months ago Aorta: Severe atherosclerotic disease throughout.  No flow-limiting stenosis identified   Right leg: Severe atherosclerotic disease throughout.  Widely patent common femoral artery, profunda, superficial femoral artery, popliteal artery.  Distally there  is single-vessel peroneal artery outflow filling the foot through collaterals.  The posterior tibial artery has a high takeoff, but occludes.  Severe small vessel disease.   Left leg: Severe atherosclerotic disease throughout.  Widely patent common femoral artery, profunda, superficial femoral artery, popliteal artery.  There appears to be three-vessels opacifying in the calf, however distally contrast washout due to heart failure, and distal tibial vessels and pedal vessels were not visualized.  Attempts to recanalize the right posterior tibial arter were unsuccessful furu to severe microvascular disease. The patient had been maximally revascularized with no further options. His pain was determined to be due to sluggish flow due to heart failure and servere peripheral arterial and microvascular disease.   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 The surgery took place that afternoon. The patient tolerated the procedure well. He went on to dialysis following the surgery.  Lactic acidosis: Suspect due to patient's leg ischemia versus infection. 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 Cont home PhosLo , ferrous sulfate  Monitor hemoglobin, electrolytes The patient underwent HD overnight 10/29/2023 - 10/30/2023. He did not get a lot of sleep. He has been somewhat uncooperative today, probably for this reason. He is now cooperating with telemetry again.   Hypoalbuminemia Mild hypoglycemia Elevated TB: Augment diet monitor labs.  Monitor blood sugar        Recent Labs  Lab 10/26/23 0518 10/26/23 1101 10/26/23 1344 10/26/23 1442  GLUCAP 64* 74 69* 85     HTN: BP well-controlled on metoprolol  and Avapro    HLD: Continue Lipitor   PAF: PTA on Eliquis  currently on  heparin    Chronic systolic CHF/pulmonary hypertension/heart failure Euvolemic, continue to address fluid status with dialysis   CAD No chest pain.   OSA Did not tolerate so not using for few years   Mobility: Independent   DVT prophylaxis: Heparin  drip Code Status:   Code Status: Full Code Family Communication: plan of care discussed with patient at bedside. Patient status is: Remains hospitalized because of severity of illness Level of care: Progressive    Dispo: The patient is from: home            Anticipated disposition: TBD Data Reviewed: I have personally reviewed following labs and imaging studies ( see epic result tab)  Vitals:   10/30/23 1228 10/30/23 1402  BP: (!) 177/101 138/78  Pulse: 77 76  Resp: (!) 21 20  Temp: 99.1 F (37.3 C) 98.4 F (36.9 C)  SpO2: 96% 96%   Exam:  Constitutional:  The patient is awake, alert, and oriented x 3. No acute distress. Eyes:  pupils and irises appear normal Normal lids and conjunctivae ENMT:  grossly normal hearing  Lips appear normal external ears, nose appear normal Oropharynx: mucosa, tongue,posterior pharynx appear normal Neck:  neck appears normal, no masses, normal ROM, supple no thyromegaly Respiratory:  No increased work of breathing. No wheezes, rales, or rhonchi No tactile fremitus Cardiovascular:  Regular rate and rhythm No murmurs, ectopy, or gallups. No lateral PMI. No thrills. Abdomen:  Abdomen is soft, non-tender, non-distended No hernias, masses, or organomegaly Normoactive bowel sounds.  Musculoskeletal:  No cyanosis, clubbing, or edema Skin:  No rashes, lesions, ulcers palpation of skin: no induration or nodules Neurologic:  CN 2-12 intact Sensation all  4 extremities intact Psychiatric:  Mental status Mood, affect appropriate Orientation to person, place, time  judgment and insight appear intact  CBC: Recent Labs  Lab 10/25/23 1123 10/26/23 0338 10/27/23 0820 10/27/23 1455  10/28/23 0358 10/29/23 0330 10/30/23 0331  WBC 5.0   < > 5.3 6.0 7.0 7.0 10.2  NEUTROABS 3.5  --   --   --   --   --   --   HGB 10.9*   < > 11.6* 10.6* 10.8* 10.2* 10.0*  HCT 33.3*   < > 35.9* 33.4* 33.3* 31.8* 30.9*  MCV 86.9   < > 86.1 89.5 86.7 87.6 87.5  PLT 145*   < > 159 132* 157 141* 149*   < > = values in this interval not displayed.   CMP: Recent Labs  Lab 10/26/23 0338 10/27/23 0820 10/27/23 1455 10/28/23 0358 10/29/23 0330 10/30/23 0331  NA 134* 135  --  134* 134* 133*  K 3.7 4.5  --  3.8 4.0 3.8  CL 94* 92*  --  95* 94* 94*  CO2 25 24  --  27 26 26   GLUCOSE 65* 59*  --  84 69* 80  BUN 38* 52*  --  29* 35* 19  CREATININE 6.43* 8.46* 8.90* 6.06* 7.78* 5.24*  CALCIUM  8.5* 9.6  --  8.8* 9.1 8.8*   GFR: Estimated Creatinine Clearance: 14.7 mL/min (A) (by C-G formula based on SCr of 5.24 mg/dL (H)). Recent Labs  Lab 10/25/23 1123  AST 22  ALT 16  ALKPHOS 76  BILITOT 1.6*  PROT 8.4*  ALBUMIN  2.8*   No results for input(s): LIPASE, AMYLASE in the last 168 hours. No results for input(s): AMMONIA in the last 168 hours. Coagulation Profile:  Recent Labs  Lab 10/25/23 1123  INR 1.3*   Unresulted Labs (From admission, onward)     Start     Ordered   10/26/23 0130  MRSA Next Gen by PCR, Nasal  Once,   R        10/26/23 0129   Signed and Held  Renal function panel  Once,   R        Signed and Held   Signed and Held  CBC  Once,   R        Signed and Held   Signed and Held  Renal function panel  Once,   R        Signed and Held   Signed and Held  CBC  Once,   R        Signed and Held           Antimicrobials/Microbiology: Anti-infectives (From admission, onward)    Start     Dose/Rate Route Frequency Ordered Stop   10/29/23 2200  vancomycin  (VANCOREADY) IVPB 1000 mg/200 mL  Status:  Discontinued        1,000 mg 200 mL/hr over 60 Minutes Intravenous Every 12 hours 10/29/23 1828 10/29/23 1829   10/29/23 2000  vancomycin  (VANCOCIN ) IVPB 1000  mg/200 mL premix        1,000 mg 200 mL/hr over 60 Minutes Intravenous  Once 10/29/23 1831 10/30/23 0218   10/27/23 1200  vancomycin  (VANCOCIN ) IVPB 1000 mg/200 mL premix        1,000 mg 200 mL/hr over 60 Minutes Intravenous Every M-W-F (Hemodialysis) 10/25/23 1659     10/26/23 1600  vancomycin  (VANCOCIN ) IVPB 1000 mg/200 mL premix        1,000 mg 200 mL/hr over  60 Minutes Intravenous  Once 10/26/23 1456 10/26/23 2112   10/25/23 2200  piperacillin -tazobactam (ZOSYN ) IVPB 2.25 g        2.25 g 100 mL/hr over 30 Minutes Intravenous Every 8 hours 10/25/23 1654     10/25/23 1530  piperacillin -tazobactam (ZOSYN ) IVPB 3.375 g        3.375 g 100 mL/hr over 30 Minutes Intravenous  Once 10/25/23 1515 10/25/23 1628   10/25/23 1530  vancomycin  (VANCOREADY) IVPB 2000 mg/400 mL        2,000 mg 200 mL/hr over 120 Minutes Intravenous  Once 10/25/23 1515 10/25/23 1807         Component Value Date/Time   SDES BLOOD SITE NOT SPECIFIED 10/25/2023 1156   SPECREQUEST  10/25/2023 1156    BOTTLES DRAWN AEROBIC AND ANAEROBIC Blood Culture results may not be optimal due to an inadequate volume of blood received in culture bottles   CULT  10/25/2023 1156    NO GROWTH 5 DAYS Performed at [facility], [address], [place], [place] [address]    REPTSTATUS 10/30/2023 FINAL 10/25/2023 1156    Procedures: Procedure(s) (LRB): AMPUTATION BELOW KNEE (Right)   Brigida Bureau, MD [facility] 10/30/2023, 3:54 PM

## 2023-10-30 NOTE — Progress Notes (Signed)
 Received patient in bed to unit.  Alert and oriented.  Informed consent signed and in chart.   TX duration:3hours66mn  Patient tolerated well.  Transported back to the room  Alert, without acute distress.  Hand-off given to patient's nurse.   Access used: Fistula Access issues: none  Total UF removed: 2000 ml Medication(s) given: vanc 1g/250ml Post HD VS: 142/81 Post HD weight: 76.8kg    10/30/23 0242  Vitals  Temp 98.3 F (36.8 C)  Temp Source Oral  BP (!) 143/90  MAP (mmHg) 107  BP Location Right Arm  BP Method Automatic  Patient Position (if appropriate) Lying  Pulse Rate 80  Pulse Rate Source Monitor  ECG Heart Rate (!) 59  Resp (!) 22  Weight 76.8 kg  Type of Weight Post-Dialysis  Oxygen Therapy  SpO2 96 %  O2 Device Room Air  Patient Activity (if Appropriate) In bed  Pulse Oximetry Type Continuous  Oximetry Probe Site Changed No  During Treatment Monitoring  Blood Flow Rate (mL/min) 0 mL/min  Arterial Pressure (mmHg) -1.01 mmHg  Venous Pressure (mmHg) -1.61 mmHg  TMP (mmHg) -52.12 mmHg  Ultrafiltration Rate (mL/min) 956 mL/min  Dialysate Flow Rate (mL/min) 0 ml/min  Dialysate Potassium Concentration 3  Dialysate Calcium  Concentration 2.5  Duration of HD Treatment -hour(s) 3.5 hour(s)  Cumulative Fluid Removed (mL) per Treatment  2200.26  HD Safety Checks Performed Yes  Post Treatment  Dialyzer Clearance Lightly streaked  Hemodialysis Intake (mL) 200 mL  Liters Processed 84  Fluid Removed (mL) 2200 mL  Tolerated HD Treatment Yes  Post-Hemodialysis Comments (S)  HD tx achieved as expected, tolerated well, pt is stable.  AVG/AVF Arterial Site Held (minutes) 10 minutes  AVG/AVF Venous Site Held (minutes) 10 minutes  Note  Patient Observations (S)  pt is in bed resting comfortably, no complaints.  Fistula / Graft Left Upper arm  Placement Date/Time: 04/07/23 1900   Orientation: Left  Access Location: Upper arm  Site Condition No complications   Fistula / Graft Assessment Present;Thrill;Bruit  Status Deaccessed  Drainage Description None      [facility]

## 2023-10-30 NOTE — Progress Notes (Signed)
 Patient is confused, alert to self only. Patient refuses to allow RN to assess his surgical incision to his RBKA. Nurse attempted to reassure, educate, and reorient patient but was unsuccessful. Per FACEs scale patient appears to be in moderate pain but refuses pain medication. When RN asked patient why he did not want anything for pain, he stated because you're not giving me pain medication. RN will continue to attempt to reassure and reorient patient. Provider is aware.

## 2023-10-31 DIAGNOSIS — I70201 Unspecified atherosclerosis of native arteries of extremities, right leg: Secondary | ICD-10-CM | POA: Diagnosis not present

## 2023-10-31 LAB — CBC WITH DIFFERENTIAL/PLATELET
Abs Immature Granulocytes: 0.1 K/uL — ABNORMAL HIGH (ref 0.00–0.07)
Basophils Absolute: 0.1 K/uL (ref 0.0–0.1)
Basophils Relative: 1 %
Eosinophils Absolute: 0.2 K/uL (ref 0.0–0.5)
Eosinophils Relative: 2 %
HCT: 31.6 % — ABNORMAL LOW (ref 39.0–52.0)
Hemoglobin: 10.3 g/dL — ABNORMAL LOW (ref 13.0–17.0)
Immature Granulocytes: 1 %
Lymphocytes Relative: 5 %
Lymphs Abs: 0.7 K/uL (ref 0.7–4.0)
MCH: 28.4 pg (ref 26.0–34.0)
MCHC: 32.6 g/dL (ref 30.0–36.0)
MCV: 87.1 fL (ref 80.0–100.0)
Monocytes Absolute: 1 K/uL (ref 0.1–1.0)
Monocytes Relative: 8 %
Neutro Abs: 10.6 K/uL — ABNORMAL HIGH (ref 1.7–7.7)
Neutrophils Relative %: 83 %
Platelets: 117 K/uL — ABNORMAL LOW (ref 150–400)
RBC: 3.63 MIL/uL — ABNORMAL LOW (ref 4.22–5.81)
RDW: 17.3 % — ABNORMAL HIGH (ref 11.5–15.5)
WBC: 12.6 K/uL — ABNORMAL HIGH (ref 4.0–10.5)
nRBC: 0.2 % (ref 0.0–0.2)

## 2023-10-31 LAB — BASIC METABOLIC PANEL WITH GFR
Anion gap: 16 — ABNORMAL HIGH (ref 5–15)
BUN: 28 mg/dL — ABNORMAL HIGH (ref 8–23)
CO2: 22 mmol/L (ref 22–32)
Calcium: 9 mg/dL (ref 8.9–10.3)
Chloride: 96 mmol/L — ABNORMAL LOW (ref 98–111)
Creatinine, Ser: 7.68 mg/dL — ABNORMAL HIGH (ref 0.61–1.24)
GFR, Estimated: 7 mL/min — ABNORMAL LOW (ref >60)
Glucose, Bld: 101 mg/dL — ABNORMAL HIGH (ref 70–99)
Potassium: 4.3 mmol/L (ref 3.5–5.1)
Sodium: 134 mmol/L — ABNORMAL LOW (ref 135–145)

## 2023-10-31 LAB — PHOSPHORUS: Phosphorus: 4.9 mg/dL — ABNORMAL HIGH (ref 2.5–4.6)

## 2023-10-31 MED ORDER — CHLORHEXIDINE GLUCONATE CLOTH 2 % EX PADS
6.0000 | MEDICATED_PAD | Freq: Every day | CUTANEOUS | Status: DC
  Administered 2023-11-01 – 2023-11-13 (×10): 6 via TOPICAL

## 2023-10-31 NOTE — Progress Notes (Signed)
 PROGRESS NOTE Jacob Daniel  FMW:[id number] DOB: [birth year] DOA: 10/25/2023 PCP: Pcp, No  Brief Narrative/Hospital Course: The patient is a 68 yr old man who presented to [facility] ED on 10/25/2023 with complaints of right foot pain and swelling x 3 days. About 3 weeks prior to presentation he  was bit by a spider on his left calf seen by PCP, was given 7 days of doxycycline  which he completed without improvement, initially about 2 cm dark area on the posterior left lower leg, he is having pain on rt grt toes for 3 days and swelling pain on rt foot as well and had been getting worse with swelling and pain.   In the ED doppler pulses bilaterally were concerning for limb ischemia. The patient was admitted on heparin . Vascular surgery was consulted. ABI's were ordered and indicated mild right lower extremity arterial disease and incompressible left ABI. The patient was continued on vancomycin  and zosyn . He received pain management.  Vascular surgery has performed lower extremity angiogram on 10/27/2023. It has demonstrated:  Sluggish flow throughout indicative of heart failure-known ejection fraction 30 to 35% 6 months ago Aorta: Severe atherosclerotic disease throughout.  No flow-limiting stenosis identified   Right leg: Severe atherosclerotic disease throughout.  Widely patent common femoral artery, profunda, superficial femoral artery, popliteal artery.  Distally there is single-vessel peroneal artery outflow filling the foot through collaterals.  The posterior tibial artery has a high takeoff, but occludes.  Severe small vessel disease.   Left leg: Severe atherosclerotic disease throughout.  Widely patent common femoral artery, profunda, superficial femoral artery, popliteal artery.  There appears to be three-vessels opacifying in the calf, however distally contrast washout due to heart failure, and distal tibial vessels and pedal vessels were not visualized.  Attempts to recanalize the right posterior  tibial arter were unsuccessful furu to severe microvascular disease. The patient had been maximally revascularized with no further options. His pain was determined to be due to sluggish flow due to heart failure and servere peripheral arterial and microvascular disease.   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 The surgery took place that afternoon. The patient tolerated the procedure well. He went on to dialysis following the surgery.  The patient has been intermittently refusing medications.   He will undergo HD on 11/01/2023. He will follow up with vascular surgery as outpatient for take down of dressing in several days.   Subjective: The patient is resting comfortably. No new complaints.   Assessment and plan:   LLE cellulitis, non healing wound on left calf -believed to be spider bite   RLE rest pain with thrombosis of the popliteal artery from above the knee, nonopacification of the trifurcation vessels below the knee, with small toe ulceration-concerning for critical limb ischemia with tissue loss: Patient underwent initial CT perfusion scan in the ED showing thrombosis see report.  Vascular consulted and placed on heparin  drip for critical limb ischemia with tissue loss.  Also started on empiric antibiotics for possible cellulitis. Plan is for lower extremity angiogram to improve distal perfusion for wound healing on Wednesday ABI indicates mild right lower extremity arterial disease may be falsely elevated due to arterial wall calcification right TBI is absent left ABI indicates noncompressible arteries left TBI absent  Continue IV vancomycin  and Zosyn , heparin  drip, pain management The patient underwent angiogram with attempted intervention on 10/27/2023. It has demonstrated:  Sluggish flow throughout indicative of heart failure-known ejection fraction 30 to  35% 6 months ago Aorta: Severe atherosclerotic disease throughout.  No  flow-limiting stenosis identified   Right leg: Severe atherosclerotic disease throughout.  Widely patent common femoral artery, profunda, superficial femoral artery, popliteal artery.  Distally there is single-vessel peroneal artery outflow filling the foot through collaterals.  The posterior tibial artery has a high takeoff, but occludes.  Severe small vessel disease.   Left leg: Severe atherosclerotic disease throughout.  Widely patent common femoral artery, profunda, superficial femoral artery, popliteal artery.  There appears to be three-vessels opacifying in the calf, however distally contrast washout due to heart failure, and distal tibial vessels and pedal vessels were not visualized.  Attempts to recanalize the right posterior tibial arter were unsuccessful furu to severe microvascular disease. The patient had been maximally revascularized with no further options. His pain was determined to be due to sluggish flow due to heart failure and servere peripheral arterial and microvascular disease.   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 The surgery took place that afternoon. The patient tolerated the procedure well. He went on to dialysis following the surgery.  The patient has been intermittently refusing medications per nursing, although the patient refutes this.  He will follow up with vascular surgery as outpatient for take down of dressing in several days.  Lactic acidosis: Suspect due to patient's leg ischemia versus infection. 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 Cont home PhosLo , ferrous sulfate  Monitor hemoglobin, electrolytes The patient underwent HD overnight 10/29/2023 - 10/30/2023. He did not get a lot of sleep. He has been somewhat uncooperative,  probably for this reason. He is now cooperating with telemetry again.  The patient will go for HD again on 11/01/2023.    Hypoalbuminemia Mild hypoglycemia Elevated TB: Augment diet monitor labs.  Monitor blood sugar        Recent Labs  Lab 10/26/23 0518 10/26/23 1101 10/26/23 1344 10/26/23 1442  GLUCAP 64* 74 69* 85     HTN: BP well-controlled on metoprolol  and Avapro    HLD: Continue Lipitor   PAF: PTA on Eliquis  currently on heparin    Chronic systolic CHF/pulmonary hypertension/heart failure Euvolemic, continue to address fluid status with dialysis   CAD No chest pain.   OSA Did not tolerate so not using for few years   Mobility:PT/OT have been consulted. The patient was able to move from [facility] back to bed with Max A+2 with Stedy to transfer back to bed today.   DVT prophylaxis: Heparin  drip Code Status:   Code Status: Full Code Family Communication: plan of care discussed with patient at bedside. Patient status is: Remains hospitalized because of severity of illness Level of care: Progressive    Dispo: The patient is from: home            Anticipated disposition: TBD Data Reviewed: I have personally reviewed following labs and imaging studies ( see epic result tab)  Vitals:   10/31/23 0830 10/31/23 1359  BP: (!) 160/104 (!) 158/99  Pulse:    Resp: (!) 21 16  Temp:  98.2 F (36.8 C)  SpO2:  99%   Exam:  Constitutional:  The patient is awake, alert, and oriented x 3. No acute distress. Respiratory:  No increased work of breathing. No wheezes, rales, or rhonchi No tactile fremitus Cardiovascular:  Regular rate and rhythm No murmurs, ectopy, or gallups. No lateral PMI. No thrills. Abdomen:  Abdomen is soft, non-tender, non-distended No hernias, masses, or organomegaly Normoactive bowel sounds.  Musculoskeletal:  No cyanosis, clubbing, or edema. Left lower extremity is bandaged.  Skin:  No rashes, lesions, ulcers palpation of skin: no induration  or nodules Neurologic:  CN 2-12 intact Sensation all 4 extremities intact Psychiatric:  Mental status Mood, affect appropriate Orientation to person, place, time  judgment and insight appear intact  CBC: Recent Labs  Lab 10/25/23 1123 10/26/23 0338 10/27/23 1455 10/28/23 0358 10/29/23 0330 10/30/23 0331 10/31/23 0342  WBC 5.0   < > 6.0 7.0 7.0 10.2 12.6*  NEUTROABS 3.5  --   --   --   --   --  10.6*  HGB 10.9*   < > 10.6* 10.8* 10.2* 10.0* 10.3*  HCT 33.3*   < > 33.4* 33.3* 31.8* 30.9* 31.6*  MCV 86.9   < > 89.5 86.7 87.6 87.5 87.1  PLT 145*   < > 132* 157 141* 149* 117*   < > = values in this interval not displayed.   CMP: Recent Labs  Lab 10/27/23 0820 10/27/23 1455 10/28/23 0358 10/29/23 0330 10/30/23 0331 10/31/23 0342  NA 135  --  134* 134* 133* 134*  K 4.5  --  3.8 4.0 3.8 4.3  CL 92*  --  95* 94* 94* 96*  CO2 24  --  27 26 26 22   GLUCOSE 59*  --  84 69* 80 101*  BUN 52*  --  29* 35* 19 28*  CREATININE 8.46* 8.90* 6.06* 7.78* 5.24* 7.68*  CALCIUM  9.6  --  8.8* 9.1 8.8* 9.0  PHOS  --   --   --   --   --  4.9*   GFR: Estimated Creatinine Clearance: 10.4 mL/min (A) (by C-G formula based on SCr of 7.68 mg/dL (H)). Recent Labs  Lab 10/25/23 1123  AST 22  ALT 16  ALKPHOS 76  BILITOT 1.6*  PROT 8.4*  ALBUMIN  2.8*   No results for input(s): LIPASE, AMYLASE in the last 168 hours. No results for input(s): AMMONIA in the last 168 hours. Coagulation Profile:  Recent Labs  Lab 10/25/23 1123  INR 1.3*   Unresulted Labs (From admission, onward)     Start     Ordered   10/26/23 0130  MRSA Next Gen by PCR, Nasal  Once,   R        10/26/23 0129   Signed and Held  Renal function panel  Once,   R        Signed and Held   Signed and Held  CBC  Once,   R        Signed and Held   Signed and Held  Renal function panel  Once,   R        Signed and Held   Medical illustrator and Held  CBC  Once,   R        Signed and Held   Signed and Held  Renal function panel  Once,    R       Question:  Specimen collection method  Answer:  Lab=Lab collect   Signed and Held   Signed and Held  CBC  Once,   R       Question:  Specimen collection method  Answer:  Lab=Lab collect   Signed and Held  Antimicrobials/Microbiology: Anti-infectives (From admission, onward)    Start     Dose/Rate Route Frequency Ordered Stop   10/29/23 2200  vancomycin  (VANCOREADY) IVPB 1000 mg/200 mL  Status:  Discontinued        1,000 mg 200 mL/hr over 60 Minutes Intravenous Every 12 hours 10/29/23 1828 10/29/23 1829   10/29/23 2000  vancomycin  (VANCOCIN ) IVPB 1000 mg/200 mL premix        1,000 mg 200 mL/hr over 60 Minutes Intravenous  Once 10/29/23 1831 10/30/23 0218   10/27/23 1200  vancomycin  (VANCOCIN ) IVPB 1000 mg/200 mL premix        1,000 mg 200 mL/hr over 60 Minutes Intravenous Every M-W-F (Hemodialysis) 10/25/23 1659     10/26/23 1600  vancomycin  (VANCOCIN ) IVPB 1000 mg/200 mL premix        1,000 mg 200 mL/hr over 60 Minutes Intravenous  Once 10/26/23 1456 10/26/23 2112   10/25/23 2200  piperacillin -tazobactam (ZOSYN ) IVPB 2.25 g        2.25 g 100 mL/hr over 30 Minutes Intravenous Every 8 hours 10/25/23 1654     10/25/23 1530  piperacillin -tazobactam (ZOSYN ) IVPB 3.375 g        3.375 g 100 mL/hr over 30 Minutes Intravenous  Once 10/25/23 1515 10/25/23 1628   10/25/23 1530  vancomycin  (VANCOREADY) IVPB 2000 mg/400 mL        2,000 mg 200 mL/hr over 120 Minutes Intravenous  Once 10/25/23 1515 10/25/23 1807         Component Value Date/Time   SDES BLOOD SITE NOT SPECIFIED 10/25/2023 1156   SPECREQUEST  10/25/2023 1156    BOTTLES DRAWN AEROBIC AND ANAEROBIC Blood Culture results may not be optimal due to an inadequate volume of blood received in culture bottles   CULT  10/25/2023 1156    NO GROWTH 5 DAYS Performed at [facility], [address] [place], [place] [address]    REPTSTATUS 10/30/2023 FINAL 10/25/2023 1156    Procedures: Procedure(s)  (LRB): AMPUTATION BELOW KNEE (Right)   Brigida Bureau, MD [facility] 10/31/2023, 4:08 PM

## 2023-10-31 NOTE — Evaluation (Signed)
 Physical Therapy Evaluation Patient Details Name: Jacob Daniel MRN: [id number] DOB: May 03, 1955 Today's Date: 10/31/2023  History of Present Illness  Pt is 68 year old presented to [facility] on  10/25/23 for rt foot pain and lt calf ulceration (spider bite per pt). Underwent RLE angiogram with unsuccessful attempted recanalization of the rt posterior tibial artery on 8/13. 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 PMH - CAD, ESRD on HD, HTN, HLD, A-Fib, heart failure, pulmonary hypertension,  chronic hypokalemia, OSA, failed renal transplant  Clinical Impression  Pt admitted with above diagnosis and presents to PT with functional limitations due to deficits listed below (See PT problem list). Pt needs skilled PT to maximize independence and safety. Pt was mobilizing on his own prior to hospitalization. Could benefit from intensive inpatient follow-up therapy, >3 hours/day if he has some support available at home.            If plan is discharge home, recommend the following: Two people to help with walking and/or transfers;A lot of help with bathing/dressing/bathroom;Assist for transportation;Assistance with cooking/housework   Can travel by Doctor, hospital (measurements PT);Wheelchair cushion (measurements PT)  Recommendations for Other Services       Functional Status Assessment Patient has had a recent decline in their functional status and demonstrates the ability to make significant improvements in function in a reasonable and predictable amount of time.     Precautions / Restrictions Precautions Precautions: Fall      Mobility  Bed Mobility Overal bed mobility: Needs Assistance Bed Mobility: Supine to Sit, Sit to Supine, Rolling Rolling: Mod assist   Supine to sit: Mod assist Sit to supine: Min assist   General bed mobility comments: Assist to bring legs off of bed and elevate trunk into sitting.    Transfers Overall transfer level: Needs  assistance Equipment used: Ambulation equipment used Transfers: Sit to/from Stand Sit to Stand: Max assist, From elevated surface          Lateral/Scoot Transfers: Min assist General transfer comment: Assist to power up and stabilize in [place]. Scooted 3' on EOB with min assist. Transfer via Lift Equipment: Stedy  Ambulation/Gait                  Stairs            Wheelchair Mobility     Tilt Bed    Modified Rankin (Stroke Patients Only)       Balance Overall balance assessment: Needs assistance Sitting-balance support: Feet supported, Single extremity supported, Bilateral upper extremity supported Sitting balance-Leahy Scale: Poor Sitting balance - Comments: UE support   Standing balance support: Bilateral upper extremity supported, During functional activity, Reliant on assistive device for balance Standing balance-Leahy Scale: Poor Standing balance comment: Static standing with Stedy x 20 sec with min assist                             Pertinent Vitals/Pain Pain Assessment Pain Assessment: Faces Faces Pain Scale: Hurts even more Pain Location: Rt residual limb and lt calf Pain Descriptors / Indicators: Grimacing, Guarding Pain Intervention(s): Limited activity within patient's tolerance, Monitored during session, Repositioned, Patient requesting pain meds-RN notified    Home Living Family/patient expects to be discharged to:: Private residence Living Arrangements: Alone Available Help at Discharge: Neighbor;Available PRN/intermittently;Personal care attendant Type of Home: Apartment Home Access: Level entry       Home Layout:  One level Home Equipment: Pharmacist, hospital (2 wheels);Grab bars - toilet;Grab bars - tub/shower;Hand held shower head      Prior Function Prior Level of Function : Independent/Modified Independent;Patient poor historian/Family not available             Mobility Comments: Pt using straight  cane       Extremity/Trunk Assessment   Upper Extremity Assessment Upper Extremity Assessment: Defer to OT evaluation    Lower Extremity Assessment Lower Extremity Assessment: Generalized weakness;RLE deficits/detail RLE Deficits / Details: BKA. Knee lacking 5 degress from full extension       Communication   Communication Communication: No apparent difficulties    Cognition Arousal: Alert Behavior During Therapy: WFL for tasks assessed/performed   PT - Cognitive impairments: No family/caregiver present to determine baseline, Problem solving, Awareness                         Following commands: Impaired Following commands impaired: Only follows one step commands consistently, Follows multi-step commands inconsistently     Cueing Cueing Techniques: Verbal cues, Tactile cues, Gestural cues     General Comments      Exercises Amputee Exercises Quad Sets: AAROM, Right, 5 reps, Supine Hip Flexion/Marching: AAROM, Right, 5 reps, Supine Knee Flexion: AAROM, Right, 5 reps, Supine Knee Extension: AAROM, Right, 5 reps, Supine   Assessment/Plan    PT Assessment Patient needs continued PT services  PT Problem List Decreased strength;Decreased range of motion;Decreased activity tolerance;Decreased balance;Decreased mobility;Decreased knowledge of use of DME;Decreased safety awareness;Pain       PT Treatment Interventions DME instruction;Gait training;Therapeutic activities;Functional mobility training;Therapeutic exercise;Balance training;Patient/family education;Wheelchair mobility training    PT Goals (Current goals can be found in the Care Plan section)  Acute Rehab PT Goals Patient Stated Goal: get better PT Goal Formulation: With patient Time For Goal Achievement: 11/14/23 Potential to Achieve Goals: Good    Frequency Min 2X/week     Co-evaluation               AM-PAC PT 6 Clicks Mobility  Outcome Measure Help needed turning from your back  to your side while in a flat bed without using bedrails?: A Lot Help needed moving from lying on your back to sitting on the side of a flat bed without using bedrails?: A Lot Help needed moving to and from a bed to a chair (including a wheelchair)?: Total Help needed standing up from a chair using your arms (e.g., wheelchair or bedside chair)?: A Lot Help needed to walk in hospital room?: Total Help needed climbing 3-5 steps with a railing? : Total 6 Click Score: 9    End of Session Equipment Utilized During Treatment: Gait belt Activity Tolerance: Patient tolerated treatment well Patient left: in bed;with call bell/phone within reach;with bed alarm set Nurse Communication: Mobility status;Patient requests pain meds PT Visit Diagnosis: Other abnormalities of gait and mobility (R26.89);Muscle weakness (generalized) (M62.81);Difficulty in walking, not elsewhere classified (R26.2);Pain Pain - Right/Left: Right Pain - part of body: Leg    Time: 1431-1501 PT Time Calculation (min) (ACUTE ONLY): 30 min   Charges:   PT Evaluation $PT Eval Moderate Complexity: 1 Mod PT Treatments $Therapeutic Activity: 8-22 mins PT General Charges $$ ACUTE PT VISIT: 1 Visit         [facility] PT Acute Rehabilitation Services Office [phone]   [place] ORN [facility] 10/31/2023, 4:50 PM

## 2023-10-31 NOTE — Progress Notes (Signed)
 Mobility Specialist Progress Note:    10/31/23 0937  Mobility  Activity Pivoted/transferred to/from [facility]  Level of Assistance Maximum assist, patient does 25-49%  Assistive Device Stedy  Activity Response Tolerated well  Mobility Referral Yes  Mobility visit 1 Mobility  Mobility Specialist Start Time (ACUTE ONLY) [id number]  Mobility Specialist Stop Time (ACUTE ONLY) [id number]  Mobility Specialist Time Calculation (min) (ACUTE ONLY) 5 min   Pt received on BSC. NT assisted with peri care after successful void. MaxA+2 with Stedy to transfer back to bed. Tolerated well, c/o BKA discomfort. Pt lying in bed with all needs met.   Fallon Howerter Mobility Specialist Please contact via Special educational needs teacher or  Rehab office at [phone]

## 2023-10-31 NOTE — Plan of Care (Signed)
  Problem: Clinical Measurements: Goal: Ability to maintain clinical measurements within normal limits will improve Outcome: Progressing Goal: Will remain free from infection Outcome: Progressing Goal: Diagnostic test results will improve Outcome: Progressing Goal: Respiratory complications will improve Outcome: Progressing Goal: Cardiovascular complication will be avoided Outcome: Progressing   Problem: Nutrition: Goal: Adequate nutrition will be maintained Outcome: Progressing   Problem: Elimination: Goal: Will not experience complications related to bowel motility Outcome: Progressing Goal: Will not experience complications related to urinary retention Outcome: Progressing   Problem: Pain Managment: Goal: General experience of comfort will improve and/or be controlled Outcome: Progressing   Problem: Safety: Goal: Ability to remain free from injury will improve Outcome: Progressing   Problem: Skin Integrity: Goal: Risk for impaired skin integrity will decrease Outcome: Progressing   Problem: Activity: Goal: Capacity to carry out activities will improve Outcome: Progressing   Problem: Cardiac: Goal: Ability to achieve and maintain adequate cardiopulmonary perfusion will improve Outcome: Progressing   Problem: Fluid Volume: Goal: Compliance with measures to maintain balanced fluid volume will improve Outcome: Progressing   Problem: Nutritional: Goal: Ability to make healthy dietary choices will improve Outcome: Progressing   Problem: Clinical Measurements: Goal: Complications related to the disease process, condition or treatment will be avoided or minimized Outcome: Progressing   Problem: Education: Goal: Knowledge of General Education information will improve Description: Including pain rating scale, medication(s)/side effects and non-pharmacologic comfort measures Outcome: Not Progressing   Problem: Health Behavior/Discharge Planning: Goal: Ability to  manage health-related needs will improve Outcome: Not Progressing   Problem: Activity: Goal: Risk for activity intolerance will decrease Outcome: Not Progressing   Problem: Coping: Goal: Level of anxiety will decrease Outcome: Not Progressing   Problem: Education: Goal: Ability to demonstrate management of disease process will improve Outcome: Not Progressing Goal: Ability to verbalize understanding of medication therapies will improve Outcome: Not Progressing   Problem: Education: Goal: Knowledge of disease and its progression will improve Outcome: Not Progressing   Problem: Health Behavior/Discharge Planning: Goal: Ability to manage health-related needs will improve Outcome: Not Progressing   Problem: Education: Goal: Understanding of CV disease, CV risk reduction, and recovery process will improve Outcome: Not Progressing Goal: Individualized Educational Video(s) Outcome: Not Progressing

## 2023-10-31 NOTE — Progress Notes (Signed)
 [facility] Progress Note   Subjective:   Patient seen and examined at bedside.  Much more alert and polite today.  States he was tired yesterday and out of it.  Apologizes for giving the nurses a hard time.  States he is feeling better today.  Denies chest pain, SOB and n/v/d.   Objective Vitals:   10/31/23 0403 10/31/23 0500 10/31/23 0830 10/31/23 1359  BP: (!) 161/106  (!) 160/104 (!) 158/99  Pulse: 63     Resp: 20  (!) 21 16  Temp: 97.8 F (36.6 C)   98.2 F (36.8 C)  TempSrc: Oral   Oral  SpO2: 100%   99%  Weight:  81.6 kg    Height:       Physical Exam General:chronically ill appearing, pleasant male in NAD Heart:RRR, no mrg Lungs:CTAB, nml WOB on RA Abdomen:soft, mildly distended Extremities:no LE edema, R BKA Dialysis Access: LU AVF   Filed Weights   10/30/23 0242 10/30/23 0256 10/31/23 0500  Weight: 76.8 kg 76.8 kg 81.6 kg    Intake/Output Summary (Last 24 hours) at 10/31/2023 1403 Last data filed at 10/30/2023 2000 Gross per 24 hour  Intake 100 ml  Output --  Net 100 ml    Additional Objective Labs: Basic Metabolic Panel: Recent Labs  Lab 10/29/23 0330 10/30/23 0331 10/31/23 0342  NA 134* 133* 134*  K 4.0 3.8 4.3  CL 94* 94* 96*  CO2 26 26 22   GLUCOSE 69* 80 101*  BUN 35* 19 28*  CREATININE 7.78* 5.24* 7.68*  CALCIUM  9.1 8.8* 9.0   Liver Function Tests: Recent Labs  Lab 10/25/23 1123  AST 22  ALT 16  ALKPHOS 76  BILITOT 1.6*  PROT 8.4*  ALBUMIN  2.8*   CBC: Recent Labs  Lab 10/25/23 1123 10/26/23 0338 10/27/23 1455 10/28/23 0358 10/29/23 0330 10/30/23 0331 10/31/23 0342  WBC 5.0   < > 6.0 7.0 7.0 10.2 12.6*  NEUTROABS 3.5  --   --   --   --   --  10.6*  HGB 10.9*   < > 10.6* 10.8* 10.2* 10.0* 10.3*  HCT 33.3*   < > 33.4* 33.3* 31.8* 30.9* 31.6*  MCV 86.9   < > 89.5 86.7 87.6 87.5 87.1  PLT 145*   < > 132* 157 141* 149* 117*   < > = values in this interval not displayed.   Blood Culture    Component Value  Date/Time   SDES BLOOD SITE NOT SPECIFIED 10/25/2023 1156   SPECREQUEST  10/25/2023 1156    BOTTLES DRAWN AEROBIC AND ANAEROBIC Blood Culture results may not be optimal due to an inadequate volume of blood received in culture bottles   CULT  10/25/2023 1156    NO GROWTH 5 DAYS Performed at [facility], [address], [place], [place] [address]    REPTSTATUS 10/30/2023 FINAL 10/25/2023 1156    Medications:  piperacillin -tazobactam (ZOSYN )  IV 2.25 g (10/31/23 1358)   vancomycin  Stopped (10/28/23 0019)    aspirin  EC  81 mg Oral Daily   atorvastatin   10 mg Oral Daily   [START ON 11/01/2023] calcitRIOL   1.75 mcg Oral Q M,W,F-1800   calcium  acetate  2,001 mg Oral TID WC   Chlorhexidine  Gluconate Cloth  6 each Topical Q0600   Chlorhexidine  Gluconate Cloth  6 each Topical Q0600   [START ON 11/01/2023] cinacalcet   30 mg Oral Q M,W,F-1800   ferrous sulfate   325 mg Oral Q breakfast  heparin   5,000 Units Subcutaneous Q8H   irbesartan   300 mg Oral Daily   metoprolol  succinate  75 mg Oral Daily   mupirocin  ointment  1 Application Nasal BID   sodium chloride  flush  3 mL Intravenous Q12H    Dialysis Orders: [facility]  on MWF . EDW 84kg HD Bath 2K/2.5Ca  Time 4:00 Heparin  1000 bolus then 600 units/hr. 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  Angiogram with intervention on 10/27/23.  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  VVS following.   Left lower extremity cellulitis - started on IV vanco and zosyn .  WBC trending up.   AMS - resolved.  ESRD -  continue with HD on MWF schedule.  Next HD on 11/01/23.   Hypertension/volume  - elevated today. no evidence of volume overload.  Will need lower EDW on d/c post BKA.  UF as tolerated.  Anemia  - stable, Hgb 10.3 this AM. Hold ESA for now.    Metabolic bone disease - Calcium  in goal. Check phos.  Continue VDRA, sensipar  and binders.   Nutrition -  renal diet w/fluid restrictions.   Manuelita Labella, PA-C [facility] 10/31/2023,2:03 PM  LOS: 6 days

## 2023-11-01 DIAGNOSIS — I70201 Unspecified atherosclerosis of native arteries of extremities, right leg: Secondary | ICD-10-CM | POA: Diagnosis not present

## 2023-11-01 LAB — BASIC METABOLIC PANEL WITH GFR
Anion gap: 16 — ABNORMAL HIGH (ref 5–15)
BUN: 40 mg/dL — ABNORMAL HIGH (ref 8–23)
CO2: 23 mmol/L (ref 22–32)
Calcium: 9.3 mg/dL (ref 8.9–10.3)
Chloride: 95 mmol/L — ABNORMAL LOW (ref 98–111)
Creatinine, Ser: 9.07 mg/dL — ABNORMAL HIGH (ref 0.61–1.24)
GFR, Estimated: 6 mL/min — ABNORMAL LOW (ref >60)
Glucose, Bld: 84 mg/dL (ref 70–99)
Potassium: 4.2 mmol/L (ref 3.5–5.1)
Sodium: 134 mmol/L — ABNORMAL LOW (ref 135–145)

## 2023-11-01 LAB — CBC WITH DIFFERENTIAL/PLATELET
Abs Immature Granulocytes: 0.06 K/uL (ref 0.00–0.07)
Basophils Absolute: 0.1 K/uL (ref 0.0–0.1)
Basophils Relative: 1 %
Eosinophils Absolute: 0.5 K/uL (ref 0.0–0.5)
Eosinophils Relative: 5 %
HCT: 28.8 % — ABNORMAL LOW (ref 39.0–52.0)
Hemoglobin: 9.4 g/dL — ABNORMAL LOW (ref 13.0–17.0)
Immature Granulocytes: 1 %
Lymphocytes Relative: 8 %
Lymphs Abs: 0.9 K/uL (ref 0.7–4.0)
MCH: 28.5 pg (ref 26.0–34.0)
MCHC: 32.6 g/dL (ref 30.0–36.0)
MCV: 87.3 fL (ref 80.0–100.0)
Monocytes Absolute: 0.7 K/uL (ref 0.1–1.0)
Monocytes Relative: 7 %
Neutro Abs: 7.8 K/uL — ABNORMAL HIGH (ref 1.7–7.7)
Neutrophils Relative %: 78 %
Platelets: 135 K/uL — ABNORMAL LOW (ref 150–400)
RBC: 3.3 MIL/uL — ABNORMAL LOW (ref 4.22–5.81)
RDW: 17.5 % — ABNORMAL HIGH (ref 11.5–15.5)
WBC: 10.1 K/uL (ref 4.0–10.5)
nRBC: 0.3 % — ABNORMAL HIGH (ref 0.0–0.2)

## 2023-11-01 MED ORDER — GABAPENTIN 100 MG PO CAPS
100.0000 mg | ORAL_CAPSULE | Freq: Every day | ORAL | Status: DC
  Administered 2023-11-01 – 2023-11-09 (×9): 100 mg via ORAL
  Filled 2023-11-01 (×9): qty 1

## 2023-11-01 MED ORDER — DARBEPOETIN ALFA 100 MCG/0.5ML IJ SOSY
100.0000 ug | PREFILLED_SYRINGE | Freq: Once | INTRAMUSCULAR | Status: AC
  Administered 2023-11-02: 100 ug via SUBCUTANEOUS
  Filled 2023-11-01: qty 0.5

## 2023-11-01 MED ORDER — APIXABAN 5 MG PO TABS
5.0000 mg | ORAL_TABLET | Freq: Two times a day (BID) | ORAL | Status: DC
  Administered 2023-11-01 – 2023-11-13 (×24): 5 mg via ORAL
  Filled 2023-11-01 (×24): qty 1

## 2023-11-01 NOTE — Progress Notes (Signed)
 PROGRESS NOTE    Jacob Daniel  FMW:[id number] DOB: August 16, 1955 DOA: 10/25/2023 PCP: Pcp, No    Brief Narrative:  68 year old with history of peripheral vascular disease, ESRD on hemodialysis presented to the emergency room with right foot pain and swelling for 3 days.  Bit by a spider on the left calf 7 days ago treated with doxycycline .  He had a great right toe pain and swelling of the right foot as well. Patient was admitted, found to have critical limb ischemia treated with heparin  and antibiotics.  Found to have severe atherosclerotic disease throughout on both legs.  Attempted recanalization of right posterior tibial artery that was unsuccessful.  Had no other options.  Underwent right-sided BKA. Waiting for skilled nursing facility.  Subjective: Patient seen and examined.  Pain is controlled.  Using oral pain medications.  He is not sure where he can go for rehab. Assessment & Plan:   Severe peripheral vascular disease, right lower extremity ischemia: Treated with antibiotics.  Ultimately underwent right BKA.  Continue PT OT.  Discontinue antibiotics.  Postop surgical management as per surgery.  Left lower extremity cellulitis, nonhealing wound left calf believed to be spider bite:  Conservative management.  Completed 7 days of IV antibiotics.  Close follow-up with vascular surgery as outpatient.  ESRD on hemodialysis, getting dialysis on a schedule,  Essential hypertension stable on irbesartan  and metoprolol   Hyperlipidemia, stable on statin  Paroxysmal A-fib, currently sinus rhythm.  Looks like he was not restarted on anticoagulation after surgery.  Day 5 after surgery.  Will resume Eliquis .  Anemia of chronic disease, stable  Metabolic bone disease, stable  Medically stable to transition to skilled nursing facility.  Can transfer to MedSurg bed.   DVT prophylaxis:  apixaban  (ELIQUIS ) tablet 5 mg   Code Status: Full code Family Communication: None at the  bedside Disposition Plan: Status is: Inpatient Remains inpatient appropriate because: Waiting for SNF bed.     Consultants:  Vascular surgery  Procedures:  Right BKA  Antimicrobials:  Completed     Objective: Vitals:   11/01/23 0253 11/01/23 0418 11/01/23 0832 11/01/23 1327  BP:  (!) (P) 147/100 (!) 155/100 (!) 144/97  Pulse:  (P) 62 60 (!) 59  Resp: (!) 8 (P) 14 17 20   Temp:  (P) 98.1 F (36.7 C) 98.2 F (36.8 C) (!) 97.3 F (36.3 C)  TempSrc:  (P) Oral Oral Oral  SpO2:  (P) 97% 97% 99%  Weight: 79.5 kg     Height:        Intake/Output Summary (Last 24 hours) at 11/01/2023 1419 Last data filed at 11/01/2023 1200 Gross per 24 hour  Intake 513 ml  Output --  Net 513 ml   Filed Weights   10/30/23 0256 10/31/23 0500 11/01/23 0253  Weight: 76.8 kg 81.6 kg 79.5 kg    Examination:  General exam: Appears calm and comfortable.  Chronically sick looking.  Not in any distress. Respiratory system: Clear to auscultation. Respiratory effort normal.  No added sounds. Cardiovascular system: S1 & S2 heard, RRR.  No pedal edema. Gastrointestinal system: Abdomen is nondistended, soft and nontender. No organomegaly or masses felt. Normal bowel sounds heard. Central nervous system: Alert and oriented. No focal neurological deficits. Extremities: Symmetric 5 x 5 power. Skin: Right below-knee amputation stump on compression socks.  Dressing not removed by me. Patient has blackish discoloration of the left calf, also about 4 x 4 centimeter in size.    Data Reviewed: I have personally  reviewed following labs and imaging studies  CBC: Recent Labs  Lab 10/28/23 0358 10/29/23 0330 10/30/23 0331 10/31/23 0342 11/01/23 0458  WBC 7.0 7.0 10.2 12.6* 10.1  NEUTROABS  --   --   --  10.6* 7.8*  HGB 10.8* 10.2* 10.0* 10.3* 9.4*  HCT 33.3* 31.8* 30.9* 31.6* 28.8*  MCV 86.7 87.6 87.5 87.1 87.3  PLT 157 141* 149* 117* 135*   Basic Metabolic Panel: Recent Labs  Lab  10/28/23 0358 10/29/23 0330 10/30/23 0331 10/31/23 0342 11/01/23 0458  NA 134* 134* 133* 134* 134*  K 3.8 4.0 3.8 4.3 4.2  CL 95* 94* 94* 96* 95*  CO2 27 26 26 22 23   GLUCOSE 84 69* 80 101* 84  BUN 29* 35* 19 28* 40*  CREATININE 6.06* 7.78* 5.24* 7.68* 9.07*  CALCIUM  8.8* 9.1 8.8* 9.0 9.3  PHOS  --   --   --  4.9*  --    GFR: Estimated Creatinine Clearance: 8.8 mL/min (A) (by C-G formula based on SCr of 9.07 mg/dL (H)). Liver Function Tests: No results for input(s): AST, ALT, ALKPHOS, BILITOT, PROT, ALBUMIN  in the last 168 hours. No results for input(s): LIPASE, AMYLASE in the last 168 hours. No results for input(s): AMMONIA in the last 168 hours. Coagulation Profile: No results for input(s): INR, PROTIME in the last 168 hours. Cardiac Enzymes: No results for input(s): CKTOTAL, CKMB, CKMBINDEX, TROPONINI in the last 168 hours. BNP (last 3 results) No results for input(s): PROBNP in the last 8760 hours. HbA1C: No results for input(s): HGBA1C in the last 72 hours. CBG: Recent Labs  Lab 10/26/23 1344 10/26/23 1442 10/29/23 1111 10/29/23 1219 10/29/23 1521  GLUCAP 69* 85 68* 126* 74   Lipid Profile: No results for input(s): CHOL, HDL, LDLCALC, TRIG, CHOLHDL, LDLDIRECT in the last 72 hours. Thyroid Function Tests: No results for input(s): TSH, T4TOTAL, FREET4, T3FREE, THYROIDAB in the last 72 hours. Anemia Panel: No results for input(s): VITAMINB12, FOLATE, FERRITIN, TIBC, IRON, RETICCTPCT in the last 72 hours. Sepsis Labs: Recent Labs  Lab 10/25/23 1557 10/25/23 1949  LATICACIDVEN 1.5 1.5    Recent Results (from the past 240 hours)  Culture, blood (routine x 2)     Status: None   Collection Time: 10/25/23 11:23 AM   Specimen: BLOOD  Result Value Ref Range Status   Specimen Description BLOOD SITE NOT SPECIFIED  Final   Special Requests   Final    BOTTLES DRAWN AEROBIC AND ANAEROBIC Blood  Culture adequate volume   Culture   Final    NO GROWTH 5 DAYS Performed at [facility], [address] [place], [place] [address]    Report Status 10/30/2023 FINAL  Final  Culture, blood (routine x 2)     Status: None   Collection Time: 10/25/23 11:56 AM   Specimen: BLOOD  Result Value Ref Range Status   Specimen Description BLOOD SITE NOT SPECIFIED  Final   Special Requests   Final    BOTTLES DRAWN AEROBIC AND ANAEROBIC Blood Culture results may not be optimal due to an inadequate volume of blood received in culture bottles   Culture   Final    NO GROWTH 5 DAYS Performed at [facility], [address] [place], [place] [address]    Report Status 10/30/2023 FINAL  Final  Surgical PCR screen     Status: None   Collection Time: 10/28/23 11:51 AM   Specimen: Nasal Mucosa; Nasal Swab  Result Value Ref  Range Status   MRSA, PCR NEGATIVE NEGATIVE Final   Staphylococcus aureus NEGATIVE NEGATIVE Final    Comment: (NOTE) The Xpert SA Assay (FDA approved for NASAL specimens in patients 75 years of age and older), is one component of a comprehensive surveillance program. It is not intended to diagnose infection nor to guide or monitor treatment. Performed at [facility], [address], [place], [place] [address]          Radiology Studies: No results found.      Scheduled Meds:  apixaban   5 mg Oral BID   aspirin  EC  81 mg Oral Daily   atorvastatin   10 mg Oral Daily   calcitRIOL   1.75 mcg Oral Q M,W,F-1800   calcium  acetate  2,001 mg Oral TID WC   Chlorhexidine  Gluconate Cloth  6 each Topical Q0600   cinacalcet   30 mg Oral Q M,W,F-1800   [START ON 11/02/2023] darbepoetin (ARANESP ) injection - DIALYSIS  100 mcg Subcutaneous Once   ferrous sulfate   325 mg Oral Q breakfast   gabapentin   100 mg Oral QHS   irbesartan   300 mg Oral Daily   metoprolol  succinate  75 mg Oral Daily   mupirocin  ointment  1 Application Nasal BID   sodium chloride  flush  3 mL  Intravenous Q12H   Continuous Infusions:   LOS: 7 days    Time spent: 45 minutes    Renato Applebaum, MD [facility]

## 2023-11-01 NOTE — Evaluation (Signed)
 Occupational Therapy Evaluation Patient Details Name: Jacob Daniel MRN: [id number] DOB: [birth year] Today's Date: 11/01/2023   History of Present Illness   Pt is 68 year old presented to [facility] on  10/25/23 for rt foot pain and lt calf ulceration (spider bite per pt). Underwent RLE angiogram with unsuccessful attempted recanalization of the rt posterior tibial artery on 8/13. 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 PMH - CAD, ESRD on HD, HTN, HLD, A-Fib, heart failure, pulmonary hypertension,  chronic hypokalemia, OSA, failed renal transplant     Clinical Impressions Pt presented in bed and reported he was walking around. Pt at this time required min assist for supine to sitting to the completed UE bathing and dressing with CGA. Pt attempted to complete sit to stand transfers with max assist x3 trials and then agreed to complete lateral scoots to the [facility] with min assist. Pt then made a comment about it snowing outside even though he was able to report it was August. Patient will benefit from intensive inpatient follow-up therapy, >3 hours/day.     If plan is discharge home, recommend the following:   Two people to help with walking and/or transfers;A lot of help with bathing/dressing/bathroom;Assistance with cooking/housework;Assist for transportation;Supervision due to cognitive status     Functional Status Assessment   Patient has had a recent decline in their functional status and demonstrates the ability to make significant improvements in function in a reasonable and predictable amount of time.     Equipment Recommendations   Wheelchair (measurements OT);Wheelchair cushion (measurements OT);BSC/3in1     Recommendations for Other Services         Precautions/Restrictions   Precautions Precautions: Fall Recall of Precautions/Restrictions: Impaired Restrictions Weight Bearing Restrictions Per Provider Order: No     Mobility Bed Mobility Overal bed mobility: Needs  Assistance Bed Mobility: Supine to Sit, Sit to Supine Rolling: Min assist   Supine to sit: Min assist Sit to supine: Contact guard assist        Transfers Overall transfer level: Needs assistance Equipment used: Rolling walker (2 wheels) Transfers: Sit to/from Stand Sit to Stand: Max assist, From elevated surface          Lateral/Scoot Transfers: Min assist General transfer comment: Pt attempted 3 trials of sit to stand with RW and needed max cues on how to sequence. Pt agreed to work on lateral scootting to [facility]      Balance Overall balance assessment: Needs assistance Sitting-balance support: Feet supported Sitting balance-Leahy Scale: Fair Sitting balance - Comments: able to complete dressing at EOB   Standing balance support: Bilateral upper extremity supported Standing balance-Leahy Scale: Poor                             ADL either performed or assessed with clinical judgement   ADL Overall ADL's : Needs assistance/impaired Eating/Feeding: Set up;Sitting   Grooming: Contact guard assist;Sitting Grooming Details (indicate cue type and reason): EOB Upper Body Bathing: Contact guard assist;Sitting Upper Body Bathing Details (indicate cue type and reason): EOB Lower Body Bathing: Moderate assistance;Minimal assistance;Sitting/lateral leans   Upper Body Dressing : Contact guard assist;Sitting Upper Body Dressing Details (indicate cue type and reason): EOB Lower Body Dressing: Moderate assistance;Sitting/lateral leans                       Vision         Perception  Praxis         Pertinent Vitals/Pain Pain Assessment Pain Assessment: Faces Faces Pain Scale: Hurts even more Pain Location: RLE Pain Descriptors / Indicators: Grimacing, Guarding, Shooting, Throbbing Pain Intervention(s): Limited activity within patient's tolerance, Monitored during session, Repositioned     Extremity/Trunk Assessment Upper Extremity  Assessment Upper Extremity Assessment: Generalized weakness   Lower Extremity Assessment Lower Extremity Assessment: Defer to PT evaluation       Communication Communication Communication: No apparent difficulties   Cognition Arousal: Alert Behavior During Therapy: WFL for tasks assessed/performed Cognition: No family/caregiver present to determine baseline, Cognition impaired   Orientation impairments: Time Awareness: Online awareness impaired Memory impairment (select all impairments): Short-term memory Attention impairment (select first level of impairment): Alternating attention Executive functioning impairment (select all impairments): Problem solving OT - Cognition Comments: Pt noted to make comments like did it snow today? Could not recall room number after informing pt                 Following commands: Impaired Following commands impaired: Only follows one step commands consistently, Follows multi-step commands inconsistently     Cueing  General Comments   Cueing Techniques: Verbal cues;Tactile cues;Gestural cues      Exercises     Shoulder Instructions      Home Living Family/patient expects to be discharged to:: Private residence Living Arrangements: Alone Available Help at Discharge: Neighbor;Available PRN/intermittently;Personal care attendant Type of Home: Apartment Home Access: Level entry     Home Layout: One level     Bathroom Shower/Tub: Walk-in shower;Tub/shower unit   Bathroom Toilet: Standard Bathroom Accessibility: Yes How Accessible: Accessible via walker Home Equipment: Pharmacist, hospital (2 wheels);Grab bars - toilet;Grab bars - tub/shower;Hand held shower head          Prior Functioning/Environment Prior Level of Function : Independent/Modified Independent;Patient poor historian/Family not available             Mobility Comments: Pt using straight cane ADLs Comments: B/D self, does own shopping, aide cleans  house    OT Problem List: Decreased strength;Decreased activity tolerance;Impaired balance (sitting and/or standing);Decreased safety awareness;Decreased knowledge of use of DME or AE;Pain   OT Treatment/Interventions: Self-care/ADL training;Therapeutic exercise;Therapeutic activities;Patient/family education;Balance training      OT Goals(Current goals can be found in the care plan section)   Acute Rehab OT Goals Patient Stated Goal: to go home OT Goal Formulation: With patient Time For Goal Achievement: 11/15/23 Potential to Achieve Goals: Fair   OT Frequency:  Min 2X/week    Co-evaluation              AM-PAC OT 6 Clicks Daily Activity     Outcome Measure Help from another person eating meals?: None Help from another person taking care of personal grooming?: A Little Help from another person toileting, which includes using toliet, bedpan, or urinal?: A Lot Help from another person bathing (including washing, rinsing, drying)?: A Lot Help from another person to put on and taking off regular upper body clothing?: A Little Help from another person to put on and taking off regular lower body clothing?: A Lot 6 Click Score: 16   End of Session Equipment Utilized During Treatment: Gait belt;Rolling walker (2 wheels) Nurse Communication: Mobility status  Activity Tolerance: Patient limited by pain;Patient limited by fatigue Patient left: in bed;with call bell/phone within reach;with bed alarm set  OT Visit Diagnosis: Unsteadiness on feet (R26.81);Other abnormalities of gait and mobility (R26.89);Muscle weakness (generalized) (M62.81);Pain Pain - Right/Left:  Right Pain - part of body: Leg                Time: 0726-0801 OT Time Calculation (min): 35 min Charges:  OT General Charges $OT Visit: 1 Visit OT Evaluation $OT Eval Moderate Complexity: 1 Mod OT Treatments $Self Care/Home Management : 8-22 mins  Warrick POUR OTR/L  Acute Rehab Services  [phone] office  number   Warrick Berber 11/01/2023, 8:11 AM

## 2023-11-01 NOTE — Plan of Care (Signed)
  Problem: Health Behavior/Discharge Planning: Goal: Ability to manage health-related needs will improve Outcome: Not Progressing   Problem: Clinical Measurements: Goal: Will remain free from infection Outcome: Progressing

## 2023-11-01 NOTE — Progress Notes (Signed)
 [facility] Progress Note   Subjective:   Seen in room. No new events. Denies cp, sob. For dialysis today.   Objective Vitals:   10/31/23 2337 11/01/23 0253 11/01/23 0418 11/01/23 0832  BP: (!) 148/92  (!) (P) 147/100 (!) 155/100  Pulse: (!) 58  (P) 62 60  Resp: 16 (!) 8 (P) 14 17  Temp: 98.4 F (36.9 C)  (P) 98.1 F (36.7 C) 98.2 F (36.8 C)  TempSrc: Oral  (P) Oral Oral  SpO2: 100%  (P) 97% 97%  Weight:  79.5 kg    Height:       Physical Exam General:  alert, lying bed, nad  Heart: RRR  Lungs: Clear bilaterally  Abdomen: non tender  Extremities:no LE edema, R BKA Dialysis Access: LU AVF   Filed Weights   10/30/23 0256 10/31/23 0500 11/01/23 0253  Weight: 76.8 kg 81.6 kg 79.5 kg    Intake/Output Summary (Last 24 hours) at 11/01/2023 1158 Last data filed at 11/01/2023 0857 Gross per 24 hour  Intake 153 ml  Output --  Net 153 ml    Additional Objective Labs: Basic Metabolic Panel: Recent Labs  Lab 10/30/23 0331 10/31/23 0342 11/01/23 0458  NA 133* 134* 134*  K 3.8 4.3 4.2  CL 94* 96* 95*  CO2 26 22 23   GLUCOSE 80 101* 84  BUN 19 28* 40*  CREATININE 5.24* 7.68* 9.07*  CALCIUM  8.8* 9.0 9.3  PHOS  --  4.9*  --    Liver Function Tests: No results for input(s): AST, ALT, ALKPHOS, BILITOT, PROT, ALBUMIN  in the last 168 hours.  CBC: Recent Labs  Lab 10/28/23 0358 10/29/23 0330 10/30/23 0331 10/31/23 0342 11/01/23 0458  WBC 7.0 7.0 10.2 12.6* 10.1  NEUTROABS  --   --   --  10.6* 7.8*  HGB 10.8* 10.2* 10.0* 10.3* 9.4*  HCT 33.3* 31.8* 30.9* 31.6* 28.8*  MCV 86.7 87.6 87.5 87.1 87.3  PLT 157 141* 149* 117* 135*   Blood Culture    Component Value Date/Time   SDES BLOOD SITE NOT SPECIFIED 10/25/2023 1156   SPECREQUEST  10/25/2023 1156    BOTTLES DRAWN AEROBIC AND ANAEROBIC Blood Culture results may not be optimal due to an inadequate volume of blood received in culture bottles   CULT  10/25/2023 1156    NO GROWTH 5  DAYS Performed at [facility], [address], [place], [place] [address]    REPTSTATUS 10/30/2023 FINAL 10/25/2023 1156    Medications:    aspirin  EC  81 mg Oral Daily   atorvastatin   10 mg Oral Daily   calcitRIOL   1.75 mcg Oral Q M,W,F-1800   calcium  acetate  2,001 mg Oral TID WC   Chlorhexidine  Gluconate Cloth  6 each Topical Q0600   cinacalcet   30 mg Oral Q M,W,F-1800   ferrous sulfate   325 mg Oral Q breakfast   gabapentin   100 mg Oral QHS   heparin   5,000 Units Subcutaneous Q8H   irbesartan   300 mg Oral Daily   metoprolol  succinate  75 mg Oral Daily   mupirocin  ointment  1 Application Nasal BID   sodium chloride  flush  3 mL Intravenous Q12H    Dialysis Orders: [facility]  on MWF . EDW 84kg HD Bath 2K/2.5Ca  Time 4:00 Heparin  1000 bolus then 600 units/hr. 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  VVS following.   Left lower extremity cellulitis - s/p IV antibiotics.  ESRD -  HD on MWF schedule.  Next HD on 11/01/23.  Hypertension/volume  - elevated today. Will need lower EDW on d/c post BKA.  UF as tolerated. Anemia  - Hgb 9.4. Resume ESA here.  Metabolic bone disease - Calcium  in goal. Check phos.  Continue VDRA, sensipar  and binders.  Dispo - pending.   Maisie Ronnald Acosta PA-C [facility] 11/01/2023,11:59 AM

## 2023-11-01 NOTE — Plan of Care (Signed)
   Problem: Clinical Measurements: Goal: Ability to maintain clinical measurements within normal limits will improve Outcome: Progressing Goal: Will remain free from infection Outcome: Progressing Goal: Diagnostic test results will improve Outcome: Progressing Goal: Respiratory complications will improve Outcome: Progressing Goal: Cardiovascular complication will be avoided Outcome: Progressing   Problem: Education: Goal: Knowledge of General Education information will improve Description: Including pain rating scale, medication(s)/side effects and non-pharmacologic comfort measures Outcome: Not Progressing   Problem: Health Behavior/Discharge Planning: Goal: Ability to manage health-related needs will improve Outcome: Not Progressing

## 2023-11-01 NOTE — Progress Notes (Addendum)
  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 CBC    Component Value Date/Time   WBC 10.1 11/01/2023 0458   RBC 3.30 (L) 11/01/2023 0458   HGB 9.4 (L) 11/01/2023 0458   HCT 28.8 (L) 11/01/2023 0458   PLT 135 (L) 11/01/2023 0458   MCV 87.3 11/01/2023 0458   MCH 28.5 11/01/2023 0458   MCHC 32.6 11/01/2023 0458   RDW 17.5 (H) 11/01/2023 0458   LYMPHSABS 0.9 11/01/2023 0458   MONOABS 0.7 11/01/2023 0458   EOSABS 0.5 11/01/2023 0458   BASOSABS 0.1 11/01/2023 0458    BMET    Component Value Date/Time   NA 134 (L) 11/01/2023 0458   K 4.2 11/01/2023 0458   CL 95 (L) 11/01/2023 0458   CO2 23 11/01/2023 0458   GLUCOSE 84 11/01/2023 0458   BUN 40 (H) 11/01/2023 0458   CREATININE 9.07 (H) 11/01/2023 0458   CALCIUM  9.3 11/01/2023 0458   GFRNONAA 6 (L) 11/01/2023 0458    INR    Component Value Date/Time   INR 1.3 (H) 10/25/2023 1123     Intake/Output Summary (Last 24 hours) at 11/01/2023 0729 Last data filed at 10/31/2023 2000 Gross per 24 hour  Intake 150 ml  Output --  Net 150 ml      Assessment/Plan:  68 y.o. male is 3 days post op, s/p: right BKA   -He is doing well this morning and denies any pain at his amputation site -Right BKA clean, dry, and bandaged. Will plan on dressing takedown in the next few days -Continue to mobilize as tolerated. Continue pain control as needed   Ahmed Holster, PA-C [facility] [phone] 11/01/2023 7:29 AM  VASCULAR STAFF ADDENDUM: I have independently interviewed and examined the patient. I agree with the above.  Mental status waxes and wanes  Dressing down Wednesday   PT Pt will need new safe discharge as he lives alone  Appreciate all teams involved in his care   Fonda FORBES Rim MD [facility] Phone Number: [phone] 11/01/2023 9:03 AM

## 2023-11-01 NOTE — Plan of Care (Signed)
  Problem: Education: Goal: Knowledge of General Education information will improve Description: Including pain rating scale, medication(s)/side effects and non-pharmacologic comfort measures Outcome: Progressing   Problem: Health Behavior/Discharge Planning: Goal: Ability to manage health-related needs will improve Outcome: Progressing   Problem: Clinical Measurements: Goal: Will remain free from infection Outcome: Progressing   Problem: Nutrition: Goal: Adequate nutrition will be maintained Outcome: Progressing   Problem: Elimination: Goal: Will not experience complications related to bowel motility Outcome: Progressing   Problem: Pain Managment: Goal: General experience of comfort will improve and/or be controlled Outcome: Progressing   Problem: Skin Integrity: Goal: Risk for impaired skin integrity will decrease Outcome: Progressing

## 2023-11-01 NOTE — Progress Notes (Signed)
 Inpatient Rehab Admissions Coordinator:   Per therapy recommendations, patient was screened for CIR candidacy by Leita Kleine, MS, CCC-SLP. At this time, Pt. Does not have a diagnosis that payor will approve for CIR. I will not place consult. Recommend TOC seek other rehab venues.Please contact me with any questions.   Leita Kleine, MS, CCC-SLP Rehab Admissions Coordinator  [phone] (celll) [phone] (office)

## 2023-11-02 DIAGNOSIS — I70201 Unspecified atherosclerosis of native arteries of extremities, right leg: Secondary | ICD-10-CM | POA: Diagnosis not present

## 2023-11-02 LAB — SURGICAL PATHOLOGY

## 2023-11-02 MED ORDER — OXYCODONE HCL 5 MG PO TABS
ORAL_TABLET | ORAL | Status: AC
  Filled 2023-11-02: qty 2

## 2023-11-02 NOTE — Progress Notes (Signed)
   11/02/23 1117  Vitals  Temp (!) 97.4 F (36.3 C)  Pulse Rate 61  Resp 20  BP (!) 137/90  SpO2 100 %  O2 Device Room Air  Weight 75.7 kg  Type of Weight Pre-Dialysis  Oxygen Therapy  Patient Activity (if Appropriate) In bed  Pulse Oximetry Type Continuous  Post Treatment  Dialyzer Clearance Clear  Hemodialysis Intake (mL) 0 mL  Liters Processed 72  Fluid Removed (mL) 3000 mL  Tolerated HD Treatment Yes  AVG/AVF Arterial Site Held (minutes) 6 minutes  AVG/AVF Venous Site Held (minutes) 6 minutes   Received patient in bed to unit.  Alert and oriented.  Informed consent signed and in chart.   TX duration:3hrs  Patient tolerated well.  Transported back to the room  Alert, without acute distress.  Hand-off given to patient's nurse.   Access used: LAVF Access issues: NONE  Total UF removed: 3L Medication(s) given: PAIN MED    [facility]amin[facility] T [facility]

## 2023-11-02 NOTE — Progress Notes (Signed)
 Received patient back on floor after HD in stable conditions. Patient stated pain has improved and feeling good. VSS. BP elevated. Morning BP medications given.

## 2023-11-02 NOTE — Progress Notes (Signed)
 PROGRESS NOTE    Jacob Daniel  FMW:[id number] DOB: 21-Jan-1956 DOA: 10/25/2023 PCP: Pcp, No    Brief Narrative:  68 year old with history of peripheral vascular disease, ESRD on hemodialysis presented to the emergency room with right foot pain and swelling for 3 days.  Bit by a spider on the left calf 7 days ago treated with doxycycline .  He had a great right toe pain and swelling of the right foot as well. Patient was admitted, found to have critical limb ischemia treated with heparin  and antibiotics.  Found to have severe atherosclerotic disease throughout on both legs.  Attempted recanalization of right posterior tibial artery that was unsuccessful.  Had no other options.  Underwent right-sided BKA. Waiting for skilled nursing facility.  Subjective:  Patient seen and examined.  Receiving dialysis.  No overnight events.  Patient tells me that his spider bite on the left leg hurts more than the amputated leg.  No other overnight events.  Waiting for a skilled nursing facility.   Assessment & Plan:   Severe peripheral vascular disease, right lower extremity ischemia: Treated with antibiotics.  Ultimately underwent right BKA.  Continue PT OT.  Postop surgical management as per surgery.  Antibiotics discontinued.  Left lower extremity cellulitis, nonhealing wound left calf believed to be spider bite:  Conservative management.  Completed 7 days of IV antibiotics.  Close follow-up with vascular surgery as outpatient.  ESRD on hemodialysis, getting dialysis on a schedule,  Essential hypertension stable on irbesartan  and metoprolol .  Hyperlipidemia, stable on statin  Paroxysmal A-fib, currently sinus rhythm.  Back on Eliquis .  Anemia of chronic disease, stable  Metabolic bone disease, stable  Medically stable to transition to skilled nursing facility.  Can transfer to MedSurg bed.   DVT prophylaxis:  apixaban  (ELIQUIS ) tablet 5 mg   Code Status: Full code Family  Communication: None at the bedside Disposition Plan: Status is: Inpatient Remains inpatient appropriate because: Waiting for SNF bed.     Consultants:  Vascular surgery Nephrology  Procedures:  Right BKA  Antimicrobials:  Completed     Objective: Vitals:   11/02/23 1030 11/02/23 1100 11/02/23 1108 11/02/23 1117  BP: 125/88 139/76 137/80 (!) 137/90  Pulse: (!) 57 68 63 61  Resp: 18 (!) 24 (!) 23 20  Temp:    (!) 97.4 F (36.3 C)  TempSrc:      SpO2: 98% 100% 99% 100%  Weight:    75.7 kg  Height:        Intake/Output Summary (Last 24 hours) at 11/02/2023 1137 Last data filed at 11/02/2023 1117 Gross per 24 hour  Intake 480 ml  Output 3000 ml  Net -2520 ml   Filed Weights   11/02/23 0741 11/02/23 0742 11/02/23 1117  Weight: 78.8 kg 78.8 kg 75.7 kg    Examination:  General exam: Appears calm and comfortable.  Pleasant interactive.  Receiving dialysis. Respiratory system: Clear to auscultation. Respiratory effort normal.  No added sounds. Cardiovascular system: S1 & S2 heard, RRR.  No pedal edema. Gastrointestinal system: Abdomen is nondistended, soft and nontender. No organomegaly or masses felt. Normal bowel sounds heard. Central nervous system: Alert and oriented. No focal neurological deficits. Extremities: Symmetric 5 x 5 power. Skin: Right below-knee amputation stump on compression socks.  Dressing not removed by me. Patient has blackish discoloration of the left calf, also about 4 x 4 centimeter in size.    Data Reviewed: I have personally reviewed following labs and imaging studies  CBC: Recent Labs  Lab 10/28/23 0358 10/29/23 0330 10/30/23 0331 10/31/23 0342 11/01/23 0458  WBC 7.0 7.0 10.2 12.6* 10.1  NEUTROABS  --   --   --  10.6* 7.8*  HGB 10.8* 10.2* 10.0* 10.3* 9.4*  HCT 33.3* 31.8* 30.9* 31.6* 28.8*  MCV 86.7 87.6 87.5 87.1 87.3  PLT 157 141* 149* 117* 135*   Basic Metabolic Panel: Recent Labs  Lab 10/28/23 0358 10/29/23 0330  10/30/23 0331 10/31/23 0342 11/01/23 0458  NA 134* 134* 133* 134* 134*  K 3.8 4.0 3.8 4.3 4.2  CL 95* 94* 94* 96* 95*  CO2 27 26 26 22 23   GLUCOSE 84 69* 80 101* 84  BUN 29* 35* 19 28* 40*  CREATININE 6.06* 7.78* 5.24* 7.68* 9.07*  CALCIUM  8.8* 9.1 8.8* 9.0 9.3  PHOS  --   --   --  4.9*  --    GFR: Estimated Creatinine Clearance: 8.3 mL/min (A) (by C-G formula based on SCr of 9.07 mg/dL (H)). Liver Function Tests: No results for input(s): AST, ALT, ALKPHOS, BILITOT, PROT, ALBUMIN  in the last 168 hours. No results for input(s): LIPASE, AMYLASE in the last 168 hours. No results for input(s): AMMONIA in the last 168 hours. Coagulation Profile: No results for input(s): INR, PROTIME in the last 168 hours. Cardiac Enzymes: No results for input(s): CKTOTAL, CKMB, CKMBINDEX, TROPONINI in the last 168 hours. BNP (last 3 results) No results for input(s): PROBNP in the last 8760 hours. HbA1C: No results for input(s): HGBA1C in the last 72 hours. CBG: Recent Labs  Lab 10/26/23 1344 10/26/23 1442 10/29/23 1111 10/29/23 1219 10/29/23 1521  GLUCAP 69* 85 68* 126* 74   Lipid Profile: No results for input(s): CHOL, HDL, LDLCALC, TRIG, CHOLHDL, LDLDIRECT in the last 72 hours. Thyroid Function Tests: No results for input(s): TSH, T4TOTAL, FREET4, T3FREE, THYROIDAB in the last 72 hours. Anemia Panel: No results for input(s): VITAMINB12, FOLATE, FERRITIN, TIBC, IRON, RETICCTPCT in the last 72 hours. Sepsis Labs: No results for input(s): PROCALCITON, LATICACIDVEN in the last 168 hours.   Recent Results (from the past 240 hours)  Culture, blood (routine x 2)     Status: None   Collection Time: 10/25/23 11:23 AM   Specimen: BLOOD  Result Value Ref Range Status   Specimen Description BLOOD SITE NOT SPECIFIED  Final   Special Requests   Final    BOTTLES DRAWN AEROBIC AND ANAEROBIC Blood Culture adequate volume    Culture   Final    NO GROWTH 5 DAYS Performed at [facility], [address], [place], [place] [address]    Report Status 10/30/2023 FINAL  Final  Culture, blood (routine x 2)     Status: None   Collection Time: 10/25/23 11:56 AM   Specimen: BLOOD  Result Value Ref Range Status   Specimen Description BLOOD SITE NOT SPECIFIED  Final   Special Requests   Final    BOTTLES DRAWN AEROBIC AND ANAEROBIC Blood Culture results may not be optimal due to an inadequate volume of blood received in culture bottles   Culture   Final    NO GROWTH 5 DAYS Performed at [facility], [address], [place], [place] [address]    Report Status 10/30/2023 FINAL  Final  Surgical PCR screen     Status: None   Collection Time: 10/28/23 11:51 AM   Specimen: Nasal Mucosa; Nasal Swab  Result Value Ref Range Status   MRSA, PCR NEGATIVE NEGATIVE Final   Staphylococcus aureus  NEGATIVE NEGATIVE Final    Comment: (NOTE) The Xpert SA Assay (FDA approved for NASAL specimens in patients 28 years of age and older), is one component of a comprehensive surveillance program. It is not intended to diagnose infection nor to guide or monitor treatment. Performed at [facility], [address] [place], [place] [address]          Radiology Studies: No results found.      Scheduled Meds:  apixaban   5 mg Oral BID   aspirin  EC  81 mg Oral Daily   atorvastatin   10 mg Oral Daily   calcitRIOL   1.75 mcg Oral Q M,W,F-1800   calcium  acetate  2,001 mg Oral TID WC   Chlorhexidine  Gluconate Cloth  6 each Topical Q0600   cinacalcet   30 mg Oral Q M,W,F-1800   ferrous sulfate   325 mg Oral Q breakfast   gabapentin   100 mg Oral QHS   irbesartan   300 mg Oral Daily   metoprolol  succinate  75 mg Oral Daily   sodium chloride  flush  3 mL Intravenous Q12H   Continuous Infusions:   LOS: 8 days    Time spent: 45 minutes    Renato Applebaum, MD [facility]

## 2023-11-02 NOTE — Plan of Care (Signed)
   Problem: Health Behavior/Discharge Planning: Goal: Ability to manage health-related needs will improve Outcome: Progressing   Problem: Clinical Measurements: Goal: Ability to maintain clinical measurements within normal limits will improve Outcome: Progressing Goal: Will remain free from infection Outcome: Progressing Goal: Diagnostic test results will improve Outcome: Progressing Goal: Respiratory complications will improve Outcome: Progressing Goal: Cardiovascular complication will be avoided Outcome: Progressing   Problem: Education: Goal: Knowledge of General Education information will improve Description: Including pain rating scale, medication(s)/side effects and non-pharmacologic comfort measures Outcome: Progressing

## 2023-11-02 NOTE — Progress Notes (Signed)
 [facility] Progress Note   Subjective:   Seen in [facility]. Just finished dialysis. Tolerated well - net UF 3L.   Objective Vitals:   11/02/23 1030 11/02/23 1100 11/02/23 1108 11/02/23 1117  BP: 125/88 139/76 137/80 (!) 137/90  Pulse: (!) 57 68 63 61  Resp: 18 (!) 24 (!) 23 20  Temp:    (!) 97.4 F (36.3 C)  TempSrc:      SpO2: 98% 100% 99% 100%  Weight:    75.7 kg  Height:       Physical Exam General:  alert, lying bed, nad  Heart: RRR  Lungs: Clear bilaterally  Abdomen: non tender  Extremities:no LE edema, R BKA Dialysis Access: LU AVF   Filed Weights   11/02/23 0741 11/02/23 0742 11/02/23 1117  Weight: 78.8 kg 78.8 kg 75.7 kg    Intake/Output Summary (Last 24 hours) at 11/02/2023 1201 Last data filed at 11/02/2023 1117 Gross per 24 hour  Intake 240 ml  Output 3000 ml  Net -2760 ml    Additional Objective Labs: Basic Metabolic Panel: Recent Labs  Lab 10/30/23 0331 10/31/23 0342 11/01/23 0458  NA 133* 134* 134*  K 3.8 4.3 4.2  CL 94* 96* 95*  CO2 26 22 23   GLUCOSE 80 101* 84  BUN 19 28* 40*  CREATININE 5.24* 7.68* 9.07*  CALCIUM  8.8* 9.0 9.3  PHOS  --  4.9*  --    Liver Function Tests: No results for input(s): AST, ALT, ALKPHOS, BILITOT, PROT, ALBUMIN  in the last 168 hours.  CBC: Recent Labs  Lab 10/28/23 0358 10/29/23 0330 10/30/23 0331 10/31/23 0342 11/01/23 0458  WBC 7.0 7.0 10.2 12.6* 10.1  NEUTROABS  --   --   --  10.6* 7.8*  HGB 10.8* 10.2* 10.0* 10.3* 9.4*  HCT 33.3* 31.8* 30.9* 31.6* 28.8*  MCV 86.7 87.6 87.5 87.1 87.3  PLT 157 141* 149* 117* 135*   Blood Culture    Component Value Date/Time   SDES BLOOD SITE NOT SPECIFIED 10/25/2023 1156   SPECREQUEST  10/25/2023 1156    BOTTLES DRAWN AEROBIC AND ANAEROBIC Blood Culture results may not be optimal due to an inadequate volume of blood received in culture bottles   CULT  10/25/2023 1156    NO GROWTH 5 DAYS Performed at [facility], [address] [place], [place] [address]    REPTSTATUS 10/30/2023 FINAL 10/25/2023 1156    Medications:    apixaban   5 mg Oral BID   aspirin  EC  81 mg Oral Daily   atorvastatin   10 mg Oral Daily   calcitRIOL   1.75 mcg Oral Q M,W,F-1800   calcium  acetate  2,001 mg Oral TID WC   Chlorhexidine  Gluconate Cloth  6 each Topical Q0600   cinacalcet   30 mg Oral Q M,W,F-1800   ferrous sulfate   325 mg Oral Q breakfast   gabapentin   100 mg Oral QHS   irbesartan   300 mg Oral Daily   metoprolol  succinate  75 mg Oral Daily   sodium chloride  flush  3 mL Intravenous Q12H    Dialysis Orders: [facility]  on MWF . EDW 84kg HD Bath 2K/2.5Ca  Time 4:00 Heparin  1000 bolus then 600 units/hr. 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  Robins.  VVS following.   Left lower extremity cellulitis - s/p IV antibiotics.  ESRD -  HD on MWF. HD off schedule today d/t staffing/inpatient census. Plan next HD Thurs.  Hypertension/volume  - BP better with UF.  Under EDW now. Will need lower EDW on d/c post BKA.  UF as tolerated. Anemia  - Hgb 9.4. Received Aranesp  100 on 8/19.  Metabolic bone disease - Calcium  in goal. Check phos.  Continue VDRA, sensipar  and binders.  Dispo - for SNF placement.   Maisie Ronnald Acosta PA-C [facility] 11/02/2023,12:01 PM

## 2023-11-02 NOTE — Progress Notes (Signed)
 PT Cancellation Note  Patient Details Name: Jacob Daniel MRN: [id number] DOB: [birth year]   Cancelled Treatment:    Reason Eval/Treat Not Completed: (P) Patient at procedure or test/unavailable (pt at HD dept.) Will continue efforts later in day per PT plan of care as schedule permits.   Connell HERO Jyllian Haynie 11/02/2023, 2:54 PM *delayed entry

## 2023-11-02 NOTE — Progress Notes (Signed)
 PT Cancellation Note  Patient Details Name: Jacob Daniel MRN: [id number] DOB: [birth year]   Cancelled Treatment:    Reason Eval/Treat Not Completed: Patient declined, no reason specified. Pt initially agreeable to participation in physical therapy but with attempts at initiating mobility or exercise the pt refuses. Pt reports he recently finished eating and does not want to do anything besides rest currently. PT will follow up as time allows.   [name] 11/02/2023, 4:18 PM

## 2023-11-03 DIAGNOSIS — I70201 Unspecified atherosclerosis of native arteries of extremities, right leg: Secondary | ICD-10-CM | POA: Diagnosis not present

## 2023-11-03 MED ORDER — CHLORHEXIDINE GLUCONATE CLOTH 2 % EX PADS: 6.0000 | MEDICATED_PAD | Freq: Every day | CUTANEOUS | Status: DC

## 2023-11-03 NOTE — Progress Notes (Signed)
 Occupational Therapy Treatment Patient Details Name: Jacob Daniel MRN: [id number] DOB: May 23, 1955 Today's Date: 11/03/2023   History of present illness Pt is 68 year old presented to [facility] on  10/25/23 for rt foot pain and lt calf ulceration (spider bite per pt). Underwent RLE angiogram with unsuccessful attempted recanalization of the rt posterior tibial artery on 8/13. 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 PMH - CAD, ESRD on HD, HTN, HLD, A-Fib, heart failure, pulmonary hypertension,  chronic hypokalemia, OSA, failed renal transplant   OT comments  Pt at this time presented in bed and was perseverating about the removal of bandages on RLE. Pt completed supine to sitting and sitting to supine with supervision to CGA with max cues on hand placement. Pt noted when sitting at EOB residual limb started to bleed and nursing was made aware. Pt completed UE dressing and bathing while sitting with CGA and mod-max cues to sequence. [name] needed assist with ordering breakfast in session due to cognition. Patient will benefit from continued inpatient follow up therapy, <3 hours/day.       If plan is discharge home, recommend the following:  Two people to help with walking and/or transfers;Two people to help with bathing/dressing/bathroom;Assistance with cooking/housework;Direct supervision/assist for medications management;Direct supervision/assist for financial management;Assist for transportation;Help with stairs or ramp for entrance;Supervision due to cognitive status   Equipment Recommendations  Wheelchair (measurements OT);Wheelchair cushion (measurements OT)    Recommendations for Other Services      Precautions / Restrictions Precautions Precautions: Fall Recall of Precautions/Restrictions: Impaired Restrictions Weight Bearing Restrictions Per Provider Order: Yes RLE Weight Bearing Per Provider Order: Non weight bearing       Mobility Bed Mobility Overal bed mobility: Needs Assistance Bed  Mobility: Supine to Sit, Sit to Supine Rolling: Supervision   Supine to sit: Contact guard Sit to supine: Contact guard assist   General bed mobility comments: Pt needs CGA once EOB due to decrease sitting balance.    Transfers Overall transfer level: Needs assistance                Lateral/Scoot Transfers: Min assist, Mod assist General transfer comment: worked on lateral scoot but due to posterior tilt and bleeding of residual limb limited  at this time     Balance Overall balance assessment: Needs assistance Sitting-balance support: Feet supported, Bilateral upper extremity supported Sitting balance-Leahy Scale: Fair Sitting balance - Comments: able to complete dressing at EOB with close CGA and cues for BUE support                                   ADL either performed or assessed with clinical judgement   ADL Overall ADL's : Needs assistance/impaired Eating/Feeding: Set up;Sitting;Bed level   Grooming: Contact guard assist;Sitting   Upper Body Bathing: Contact guard assist;Sitting   Lower Body Bathing: Moderate assistance;Sitting/lateral leans;Maximal assistance   Upper Body Dressing : Contact guard assist   Lower Body Dressing: Moderate assistance;Maximal assistance;Sitting/lateral leans                      Extremity/Trunk Assessment Upper Extremity Assessment Upper Extremity Assessment: Generalized weakness   Lower Extremity Assessment Lower Extremity Assessment: Defer to PT evaluation        Vision       Perception     Praxis     Communication Communication Communication: No apparent difficulties   Cognition Arousal: Alert Behavior During  Therapy: WFL for tasks assessed/performed Cognition: No family/caregiver present to determine baseline, Cognition impaired   Orientation impairments: Time Awareness: Online awareness impaired Memory impairment (select all impairments): Short-term memory Attention impairment  (select first level of impairment): Alternating attention Executive functioning impairment (select all impairments): Problem solving OT - Cognition Comments: No family present and no other family has come in to ask prior level of cognition. Pt was able to report where they are, name but was not orientated to date/year. Pt was shown where to find in room and by the end of session forgot. Pt perseverated on removal of bandages on RLE in session.                 Following commands: Impaired Following commands impaired: Only follows one step commands consistently, Follows multi-step commands inconsistently      Cueing   Cueing Techniques: Verbal cues, Tactile cues, Gestural cues  Exercises      Shoulder Instructions       General Comments      Pertinent Vitals/ Pain       Pain Assessment Pain Assessment: Faces Faces Pain Scale: Hurts a little bit Pain Location: RLE Pain Descriptors / Indicators: Grimacing, Guarding, Shooting, Throbbing Pain Intervention(s): Limited activity within patient's tolerance, Monitored during session, Repositioned  Home Living                                          Prior Functioning/Environment              Frequency  Min 2X/week        Progress Toward Goals  OT Goals(current goals can now be found in the care plan section)  Progress towards OT goals: Progressing toward goals  Acute Rehab OT Goals Patient Stated Goal: none OT Goal Formulation: With patient Time For Goal Achievement: 11/15/23 Potential to Achieve Goals: Fair ADL Goals Pt Will Perform Upper Body Bathing: with modified independence;sitting Pt Will Perform Lower Body Bathing: with min assist;sitting/lateral leans Pt Will Perform Upper Body Dressing: with modified independence Pt Will Perform Lower Body Dressing: with min assist;sitting/lateral leans Pt Will Transfer to Toilet: with mod assist;squat pivot transfer;bedside commode Pt Will Perform  Toileting - Clothing Manipulation and hygiene: with max assist  Plan      Co-evaluation                 AM-PAC OT 6 Clicks Daily Activity     Outcome Measure   Help from another person eating meals?: None Help from another person taking care of personal grooming?: A Little Help from another person toileting, which includes using toliet, bedpan, or urinal?: A Lot Help from another person bathing (including washing, rinsing, drying)?: A Lot Help from another person to put on and taking off regular upper body clothing?: A Little Help from another person to put on and taking off regular lower body clothing?: A Lot 6 Click Score: 16    End of Session    OT Visit Diagnosis: Unsteadiness on feet (R26.81);Other abnormalities of gait and mobility (R26.89);Muscle weakness (generalized) (M62.81);Pain Pain - Right/Left: Right Pain - part of body: Leg   Activity Tolerance Other (comment) (residual limb bleeding but nursing was aware)   Patient Left in bed;with call bell/phone within reach;with bed alarm set   Nurse Communication Mobility status        Time: 9197-9169 OT Time Calculation (min):  28 min  Charges: OT General Charges $OT Visit: 1 Visit OT Treatments $Self Care/Home Management : 23-37 mins  Warrick POUR OTR/L  Acute Rehab Services  [phone] office number   Warrick Berber 11/03/2023, 8:41 AM

## 2023-11-03 NOTE — Progress Notes (Signed)
 PROGRESS NOTE    Gurney Balthazor  FMW:[id number] DOB: 1955/08/13 DOA: 10/25/2023 PCP: Pcp, No    Brief Narrative:  68 year old with history of peripheral vascular disease, ESRD on hemodialysis presented to the emergency room with right foot pain and swelling for 3 days.  Bit by a spider on the left calf 7 days ago treated with doxycycline .  He had a great right toe pain and swelling of the right foot as well. Patient was admitted, found to have critical limb ischemia treated with heparin  and antibiotics.  Found to have severe atherosclerotic disease throughout on both legs.  Attempted recanalization of right posterior tibial artery that was unsuccessful.  Had no other options.  Underwent right-sided BKA. Waiting for skilled nursing facility.  Subjective:  Patient seen and examined.  No overnight events.  Surgery took down the dressing.  He feels much better today.  It is not hurting as per him.  Medically stable.  Waiting to go to SNF.   Assessment & Plan:   Severe peripheral vascular disease, right lower extremity ischemia: Treated with antibiotics.  Ultimately underwent right BKA.  Continue PT OT.  Postop surgical management as per surgery.  Antibiotics discontinued.  Left lower extremity cellulitis, nonhealing wound left calf believed to be spider bite:  Conservative management.  Completed 7 days of IV antibiotics.  Close follow-up with vascular surgery as outpatient.  ESRD on hemodialysis, getting dialysis on a schedule,  Essential hypertension stable on irbesartan  and metoprolol .  Hyperlipidemia, stable on statin  Paroxysmal A-fib, currently sinus rhythm.  Back on Eliquis .  Anemia of chronic disease, stable  Metabolic bone disease, stable  Medically stable to transition to skilled nursing facility.  Can transfer to MedSurg bed.   DVT prophylaxis:  apixaban  (ELIQUIS ) tablet 5 mg   Code Status: Full code Family Communication: None at the bedside Disposition Plan:  Status is: Inpatient Remains inpatient appropriate because: Waiting for SNF bed.     Consultants:  Vascular surgery Nephrology  Procedures:  Right BKA  Antimicrobials:  Completed     Objective: Vitals:   11/02/23 1718 11/02/23 2027 11/03/23 0423 11/03/23 0751  BP: 125/81 115/79 132/84 (!) 149/100  Pulse: (!) 56 (!) 54 (!) 53 (!) 50  Resp:   16 16  Temp: (!) 97.2 F (36.2 C) 98.1 F (36.7 C) 98 F (36.7 C) 98.1 F (36.7 C)  TempSrc: Oral Oral Oral Oral  SpO2:  94% 100% 100%  Weight:   78.2 kg   Height:        Intake/Output Summary (Last 24 hours) at 11/03/2023 1024 Last data filed at 11/03/2023 0900 Gross per 24 hour  Intake 600 ml  Output 3000 ml  Net -2400 ml   Filed Weights   11/02/23 0742 11/02/23 1117 11/03/23 0423  Weight: 78.8 kg 75.7 kg 78.2 kg    Examination:  General exam: Calm and comfortable.  Pleasant and interactive. Respiratory system: Clear to auscultation. Respiratory effort normal.  No added sounds. Cardiovascular system: S1 & S2 heard, RRR.  No pedal edema. Gastrointestinal system: Abdomen is nondistended, soft and nontender. No organomegaly or masses felt. Normal bowel sounds heard. Central nervous system: Alert and oriented. No focal neurological deficits. Extremities: Symmetric 5 x 5 power. Skin: Right below-knee amputation stump with staples intact.  Some serosanguineous drainage present.  Left leg posterior area with a 4 x 4 centimeter superficial ulceration.    Data Reviewed: I have personally reviewed following labs and imaging studies  CBC: Recent Labs  Lab 10/28/23 0358 10/29/23 0330 10/30/23 0331 10/31/23 0342 11/01/23 0458  WBC 7.0 7.0 10.2 12.6* 10.1  NEUTROABS  --   --   --  10.6* 7.8*  HGB 10.8* 10.2* 10.0* 10.3* 9.4*  HCT 33.3* 31.8* 30.9* 31.6* 28.8*  MCV 86.7 87.6 87.5 87.1 87.3  PLT 157 141* 149* 117* 135*   Basic Metabolic Panel: Recent Labs  Lab 10/28/23 0358 10/29/23 0330 10/30/23 0331  10/31/23 0342 11/01/23 0458  NA 134* 134* 133* 134* 134*  K 3.8 4.0 3.8 4.3 4.2  CL 95* 94* 94* 96* 95*  CO2 27 26 26 22 23   GLUCOSE 84 69* 80 101* 84  BUN 29* 35* 19 28* 40*  CREATININE 6.06* 7.78* 5.24* 7.68* 9.07*  CALCIUM  8.8* 9.1 8.8* 9.0 9.3  PHOS  --   --   --  4.9*  --    GFR: Estimated Creatinine Clearance: 8.6 mL/min (A) (by C-G formula based on SCr of 9.07 mg/dL (H)). Liver Function Tests: No results for input(s): AST, ALT, ALKPHOS, BILITOT, PROT, ALBUMIN  in the last 168 hours. No results for input(s): LIPASE, AMYLASE in the last 168 hours. No results for input(s): AMMONIA in the last 168 hours. Coagulation Profile: No results for input(s): INR, PROTIME in the last 168 hours. Cardiac Enzymes: No results for input(s): CKTOTAL, CKMB, CKMBINDEX, TROPONINI in the last 168 hours. BNP (last 3 results) No results for input(s): PROBNP in the last 8760 hours. HbA1C: No results for input(s): HGBA1C in the last 72 hours. CBG: Recent Labs  Lab 10/29/23 1111 10/29/23 1219 10/29/23 1521  GLUCAP 68* 126* 74   Lipid Profile: No results for input(s): CHOL, HDL, LDLCALC, TRIG, CHOLHDL, LDLDIRECT in the last 72 hours. Thyroid Function Tests: No results for input(s): TSH, T4TOTAL, FREET4, T3FREE, THYROIDAB in the last 72 hours. Anemia Panel: No results for input(s): VITAMINB12, FOLATE, FERRITIN, TIBC, IRON, RETICCTPCT in the last 72 hours. Sepsis Labs: No results for input(s): PROCALCITON, LATICACIDVEN in the last 168 hours.   Recent Results (from the past 240 hours)  Culture, blood (routine x 2)     Status: None   Collection Time: 10/25/23 11:23 AM   Specimen: BLOOD  Result Value Ref Range Status   Specimen Description BLOOD SITE NOT SPECIFIED  Final   Special Requests   Final    BOTTLES DRAWN AEROBIC AND ANAEROBIC Blood Culture adequate volume   Culture   Final    NO GROWTH 5 DAYS Performed at  [facility], [address], [place], [place] [address]    Report Status 10/30/2023 FINAL  Final  Culture, blood (routine x 2)     Status: None   Collection Time: 10/25/23 11:56 AM   Specimen: BLOOD  Result Value Ref Range Status   Specimen Description BLOOD SITE NOT SPECIFIED  Final   Special Requests   Final    BOTTLES DRAWN AEROBIC AND ANAEROBIC Blood Culture results may not be optimal due to an inadequate volume of blood received in culture bottles   Culture   Final    NO GROWTH 5 DAYS Performed at [facility], [address] [place], [place] [address]    Report Status 10/30/2023 FINAL  Final  Surgical PCR screen     Status: None   Collection Time: 10/28/23 11:51 AM   Specimen: Nasal Mucosa; Nasal Swab  Result Value Ref Range Status   MRSA, PCR NEGATIVE NEGATIVE Final   Staphylococcus aureus NEGATIVE NEGATIVE Final  Comment: (NOTE) The Xpert SA Assay (FDA approved for NASAL specimens in patients 95 years of age and older), is one component of a comprehensive surveillance program. It is not intended to diagnose infection nor to guide or monitor treatment. Performed at [facility], [address], [place], [place] [address]          Radiology Studies: No results found.      Scheduled Meds:  apixaban   5 mg Oral BID   aspirin  EC  81 mg Oral Daily   atorvastatin   10 mg Oral Daily   calcitRIOL   1.75 mcg Oral Q M,W,F-1800   calcium  acetate  2,001 mg Oral TID WC   Chlorhexidine  Gluconate Cloth  6 each Topical Q0600   cinacalcet   30 mg Oral Q M,W,F-1800   ferrous sulfate   325 mg Oral Q breakfast   gabapentin   100 mg Oral QHS   irbesartan   300 mg Oral Daily   metoprolol  succinate  75 mg Oral Daily   sodium chloride  flush  3 mL Intravenous Q12H   Continuous Infusions:   LOS: 9 days    Time spent: 45 minutes    Renato Applebaum, MD [facility]

## 2023-11-03 NOTE — Progress Notes (Signed)
 [name] Progress Note   Subjective:  Seen in his room. He reports that he got better sleep for the past 2 nights and is in better spirits. He was eating breakfast. He denied any dyspnea or CP today. Next HD 11/04/23  Objective Vitals:   11/02/23 1718 11/02/23 2027 11/03/23 0423 11/03/23 0751  BP: 125/81 115/79 132/84 (!) 149/100  Pulse: (!) 56 (!) 54 (!) 53 (!) 50  Resp:   16 16  Temp: (!) 97.2 F (36.2 C) 98.1 F (36.7 C) 98 F (36.7 C) 98.1 F (36.7 C)  TempSrc: Oral Oral Oral Oral  SpO2:  94% 100% 100%  Weight:   78.2 kg   Height:       Physical Exam General:  alert, lying bed, nad  Heart: RRR  Lungs: Clear bilaterally  Abdomen: non tender  Extremities:no LE edema, R BKA Dialysis Access: LU AVF   Filed Weights   11/02/23 0742 11/02/23 1117 11/03/23 0423  Weight: 78.8 kg 75.7 kg 78.2 kg    Intake/Output Summary (Last 24 hours) at 11/03/2023 1047 Last data filed at 11/03/2023 0900 Gross per 24 hour  Intake 600 ml  Output 3000 ml  Net -2400 ml    Additional Objective Labs: Basic Metabolic Panel: Recent Labs  Lab 10/30/23 0331 10/31/23 0342 11/01/23 0458  NA 133* 134* 134*  K 3.8 4.3 4.2  CL 94* 96* 95*  CO2 26 22 23   GLUCOSE 80 101* 84  BUN 19 28* 40*  CREATININE 5.24* 7.68* 9.07*  CALCIUM  8.8* 9.0 9.3  PHOS  --  4.9*  --    Liver Function Tests: No results for input(s): AST, ALT, ALKPHOS, BILITOT, PROT, ALBUMIN  in the last 168 hours.  CBC: Recent Labs  Lab 10/28/23 0358 10/29/23 0330 10/30/23 0331 10/31/23 0342 11/01/23 0458  WBC 7.0 7.0 10.2 12.6* 10.1  NEUTROABS  --   --   --  10.6* 7.8*  HGB 10.8* 10.2* 10.0* 10.3* 9.4*  HCT 33.3* 31.8* 30.9* 31.6* 28.8*  MCV 86.7 87.6 87.5 87.1 87.3  PLT 157 141* 149* 117* 135*    Medications:    apixaban   5 mg Oral BID   aspirin  EC  81 mg Oral Daily   atorvastatin   10 mg Oral Daily   calcitRIOL   1.75 mcg Oral Q M,W,F-1800   calcium  acetate  2,001 mg Oral TID WC    Chlorhexidine  Gluconate Cloth  6 each Topical Q0600   cinacalcet   30 mg Oral Q M,W,F-1800   ferrous sulfate   325 mg Oral Q breakfast   gabapentin   100 mg Oral QHS   irbesartan   300 mg Oral Daily   metoprolol  succinate  75 mg Oral Daily   sodium chloride  flush  3 mL Intravenous Q12H    Dialysis Orders: [facility]  on MWF . EDW 84kg HD Bath 2K/2.5Ca  Time 4:00 Heparin  1000 bolus then 600 units/hr. 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  VVS following.   Left lower extremity cellulitis - s/p IV antibiotics.  ESRD -  HD on MWF. HD off schedule recent d/t staffing/inpatient census. Plan next HD 11/04/23.  Hypertension/volume  - BP better with UF.  Under EDW now. Will need lower EDW on d/c post BKA.  UF  as tolerated. Anemia  - Hgb 9.4. Received Aranesp  100 on 8/19.  Metabolic bone disease - Calcium  in goal. Check phos.  Continue VDRA, sensipar  and binders.  Dispo - for SNF placement.   Belvie Och, NP [facility] 11/03/2023,10:47 AM

## 2023-11-03 NOTE — Plan of Care (Signed)

## 2023-11-03 NOTE — Discharge Instructions (Signed)
.  sjr

## 2023-11-03 NOTE — TOC Initial Note (Addendum)
 Transition of Care [facility]) - Initial/Assessment Note    Patient Details  Name: Jacob Daniel MRN: [id number] Date of Birth: March 04, 1956  Transition of Care [facility]) CM/SW Contact:    Montie LOISE Louder, LCSW Phone Number: 11/03/2023, 1:29 PM  Clinical Narrative:        CSW met with patient at bedside. CSW introduced self and explained role. Patient reports he lives home alone. CSW discussed with patient therapy recommendations for short term rehab at [facility]. Patient states he is agreeable to short term rehab at [facility]. He states he does not have help at home. CSW explained the SNF process. Patient prefers SNF in the [place] area. CSW explained he will have to be assigned temp HD clinics while at rehab-patient agreeable.    HD [facility] -[facility] MWF @ 6am  TOC will provide bed offers once available  Renal Navigator will be updated once bed is secured at SNF.   Montie Louder, MSW, LCSW Clinical Social Worker              Expected Discharge Plan: Skilled Nursing Facility Barriers to Discharge: English as a second language teacher, SNF Pending bed offer (HD Clinic)   Patient Goals and CMS Choice            Expected Discharge Plan and Services                                              Prior Living Arrangements/Services   Lives with:: Self Patient language and need for interpreter reviewed:: No        Need for Family Participation in Patient Care: Yes (Comment)     Criminal Activity/Legal Involvement Pertinent to Current Situation/Hospitalization: No - Comment as needed  Activities of Daily Living   ADL Screening (condition at time of admission) Independently performs ADLs?: Yes (appropriate for developmental age) Is the patient deaf or have difficulty hearing?: No Does the patient have difficulty seeing, even when wearing glasses/contacts?: No Does the patient have difficulty concentrating, remembering, or making decisions?: No  Permission  Sought/Granted Permission sought to share information with : Family Supports Permission granted to share information with : Yes, Verbal Permission Granted  Share Information with NAME: [name]  Permission granted to share info w AGENCY: SNFs  Permission granted to share info w Relationship: daughter  Permission granted to share info w Contact Information: [phone]  Emotional Assessment Appearance:: Appears stated age Attitude/Demeanor/Rapport: Engaged, Self-Confident Affect (typically observed): Accepting, Appropriate Orientation: : Oriented to Self, Oriented to Place, Oriented to  Time, Oriented to Situation   Psych Involvement: No (comment)  Admission diagnosis:  Lower limb ischemia [I99.8] Popliteal artery occlusion, 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] Patient Active Problem List   Diagnosis Date Noted   Popliteal artery occlusion, 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   ESRD (end stage renal disease) on dialysis (HCC) 05/05/2023   Volume overload 05/04/2023   History of CAD (coronary artery disease) 05/04/2023   Chronic dyspnea dyspnea 05/04/2023   Hyperlipidemia 05/04/2023   Paroxysmal atrial fibrillation (HCC) 05/04/2023   Chronic hypokalemia 05/04/2023   Chronic hypoxic respiratory failure (HCC) 05/04/2023   Atrial flutter with rapid ventricular response (HCC) 04/07/2023   RSV (respiratory syncytial virus pneumonia) 04/07/2023   Acute pulmonary edema (HCC) 04/07/2023   Atrial fibrillation with RVR (HCC) 04/07/2023   Acute on chronic systolic CHF (congestive heart failure) (HCC) 04/07/2023   Coronary artery calcification seen on  CAT scan 04/07/2023   Pure hypercholesterolemia 04/07/2023   Pulmonary hypertension, unspecified (HCC) 04/07/2023   Pulmonary edema 01/17/2020   Erectile dysfunction 11/23/2019   Alcohol abuse 10/04/2019   Cannabis abuse 10/04/2019   Cardiomyopathy in diseases classified elsewhere (HCC) 10/04/2019   Chronic kidney disease, stage 3 unspecified (HCC) 10/04/2019    Dependence on renal dialysis (HCC) 10/04/2019   Hypomagnesemia 10/04/2019   Metabolic acidosis 10/04/2019   Obesity 10/04/2019   OSA (obstructive sleep apnea) 10/04/2019   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   Chronic systolic CHF (congestive heart failure) (HCC) 10/04/2019   End stage renal disease (HCC) 10/04/2019   Balance problem 05/26/2018   Chronic pain of right knee 05/26/2018   Right lumbar radiculitis 04/28/2018   Anemia 10/20/2017   History of colon polyps 10/20/2017   NSAID long-term use 10/20/2017   Positive fecal occult blood test 10/20/2017   Acute gout 09/08/2017   Chronic bilateral low back pain with right-sided sciatica 09/08/2017   Transplant rejection 04/16/2016   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 03/05/2015   Essential hypertension 09/08/2013   Sciatica 09/08/2013   History of renal transplant 08/10/2013   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 [place] [address] Phone: [phone] Fax: [phone]     Social Drivers of Health (SDOH) Social History: SDOH Screenings   Food Insecurity: No Food Insecurity (10/25/2023)  Housing: Low Risk  (10/25/2023)  Transportation Needs: No Transportation Needs (10/25/2023)  Utilities: Not At Risk (10/25/2023)  Financial Resource Strain: Low Risk  (11/05/2022)   Received from [facility]  Social Connections: Moderately Integrated (10/25/2023)  Tobacco Use: Low Risk  (10/29/2023)   SDOH Interventions:     Readmission Risk Interventions    05/05/2023   12:37 PM  Readmission Risk Prevention Plan  Transportation Screening Complete  Medication Review (RN Care Manager) Referral to  Pharmacy  HRI or Home Care Consult Complete  SW Recovery Care/Counseling Consult Complete  Palliative Care Screening Not Applicable  Skilled Nursing Facility Not Applicable

## 2023-11-03 NOTE — [place] FL2 (Signed)
 [place]  MEDICAID FL2 LEVEL OF CARE FORM     IDENTIFICATION  Patient Name: Jacob Daniel Birthdate: 03-23-1955 Sex: male Admission Date (Current Location): 10/25/2023  [facility] and [place] Number:  Producer, television/film/video and Address:  [facility], [address], [place], [place] [address]      Provider Number:    Attending Physician Name and Address:  Raenelle Coria, MD  Relative Name and Phone Number:       Current Level of Care: Hospital Recommended Level of Care: Skilled Nursing Facility Prior Approval Number:    Date Approved/Denied:   PASRR Number: [id number]  Discharge Plan: SNF    Current Diagnoses: Patient Active Problem List   Diagnosis Date Noted   Popliteal artery occlusion, 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   ESRD (end stage renal disease) on dialysis (HCC) 05/05/2023   Volume overload 05/04/2023   History of CAD (coronary artery disease) 05/04/2023   Chronic dyspnea dyspnea 05/04/2023   Hyperlipidemia 05/04/2023   Paroxysmal atrial fibrillation (HCC) 05/04/2023   Chronic hypokalemia 05/04/2023   Chronic hypoxic respiratory failure (HCC) 05/04/2023   Atrial flutter with rapid ventricular response (HCC) 04/07/2023   RSV (respiratory syncytial virus pneumonia) 04/07/2023   Acute pulmonary edema (HCC) 04/07/2023   Atrial fibrillation with RVR (HCC) 04/07/2023   Acute on chronic systolic CHF (congestive heart failure) (HCC) 04/07/2023   Coronary artery calcification seen on CAT scan 04/07/2023   Pure hypercholesterolemia 04/07/2023   Pulmonary hypertension, unspecified (HCC) 04/07/2023   Pulmonary edema 01/17/2020   Erectile dysfunction 11/23/2019   Alcohol abuse 10/04/2019   Cannabis abuse 10/04/2019   Cardiomyopathy in diseases classified elsewhere (HCC) 10/04/2019   Chronic kidney disease, stage 3 unspecified (HCC) 10/04/2019   Dependence on renal dialysis (HCC) 10/04/2019   Hypomagnesemia 10/04/2019   Metabolic acidosis  10/04/2019   Obesity 10/04/2019   OSA (obstructive sleep apnea) 10/04/2019   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   Chronic systolic CHF (congestive heart failure) (HCC) 10/04/2019   End stage renal disease (HCC) 10/04/2019   Balance problem 05/26/2018   Chronic pain of right knee 05/26/2018   Right lumbar radiculitis 04/28/2018   Anemia 10/20/2017   History of colon polyps 10/20/2017   NSAID long-term use 10/20/2017   Positive fecal occult blood test 10/20/2017   Acute gout 09/08/2017   Chronic bilateral low back pain with right-sided sciatica 09/08/2017   Transplant rejection 04/16/2016   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 03/05/2015   Essential hypertension 09/08/2013   Sciatica 09/08/2013   History of renal transplant 08/10/2013   Type 2 diabetes mellitus with complications (HCC) 07/12/2013    Orientation RESPIRATION BLADDER Height & Weight     Self, Time, Situation, Place  Normal Continent Weight: 172 lb 6.4 oz (78.2 kg) Height:  (P) 6' 1 (185.4 cm)  BEHAVIORAL SYMPTOMS/MOOD NEUROLOGICAL BOWEL NUTRITION STATUS      Continent Diet (please see discharge summary)  AMBULATORY STATUS COMMUNICATION OF NEEDS Skin   Limited Assist Verbally Surgical wounds (wound pretibial left, closed surgical incision right leg)                       Personal Care Assistance Level of Assistance  Bathing, Feeding, Dressing Bathing Assistance: Limited assistance Feeding assistance: Independent Dressing Assistance: Limited assistance     Functional Limitations Info  Sight, Hearing, Speech Sight Info: Adequate Hearing Info: Adequate Speech Info: Adequate    SPECIAL CARE FACTORS FREQUENCY  PT (By licensed PT), OT (  By licensed OT)     PT Frequency: 5x per week OT Frequency: 5x per week            Contractures Contractures Info: Not present    Additional Factors Info  Code Status, Allergies Code Status Info: FULL Allergies Info: NKA            Current Medications (11/03/2023):  This is the current hospital active medication list Current Facility-Administered Medications  Medication Dose Route Frequency Provider Last Rate Last Admin   acetaminophen  (TYLENOL ) tablet 650 mg  650 mg Oral Q6H PRN Schuh, McKenzi P, PA-C       Or   acetaminophen  (TYLENOL ) suppository 650 mg  650 mg Rectal Q6H PRN Schuh, McKenzi P, PA-C       acetaminophen  (TYLENOL ) tablet 650 mg  650 mg Oral Q4H PRN Schuh, McKenzi P, PA-C       albuterol  (PROVENTIL ) (2.5 MG/3ML) 0.083% nebulizer solution 2.5 mg  2.5 mg Inhalation Q6H PRN Schuh, McKenzi P, PA-C       apixaban  (ELIQUIS ) tablet 5 mg  5 mg Oral BID Ghimire, Kuber, MD   5 mg at 11/03/23 9075   aspirin  EC tablet 81 mg  81 mg Oral Daily Schuh, McKenzi P, PA-C   81 mg at 11/03/23 9075   atorvastatin  (LIPITOR) tablet 10 mg  10 mg Oral Daily Schuh, McKenzi P, PA-C   10 mg at 11/03/23 9075   bisacodyl  (DULCOLAX) EC tablet 5 mg  5 mg Oral Daily PRN Schuh, McKenzi P, PA-C       calcitRIOL  (ROCALTROL ) capsule 1.75 mcg  1.75 mcg Oral Q M,W,F-1800 Penninger, Manuelita, PA       calcium  acetate (PHOSLO ) capsule 2,001 mg  2,001 mg Oral TID WC Schuh, McKenzi P, PA-C   2,001 mg at 11/03/23 1241   Chlorhexidine  Gluconate Cloth 2 % PADS 6 each  6 each Topical Q0600 Penninger, Manuelita, PA   6 each at 11/03/23 0601   cinacalcet  (SENSIPAR ) tablet 30 mg  30 mg Oral Q M,W,F-1800 Penninger, Manuelita, PA       ferrous sulfate  tablet 325 mg  325 mg Oral Q breakfast Schuh, McKenzi P, PA-C   325 mg at 11/03/23 9075   gabapentin  (NEURONTIN ) capsule 100 mg  100 mg Oral QHS Ghimire, Kuber, MD   100 mg at 11/02/23 2205   hydrALAZINE  (APRESOLINE ) injection 5 mg  5 mg Intravenous Q20 Min PRN Schuh, McKenzi P, PA-C   5 mg at 10/30/23 1236   ipratropium-albuterol  (DUONEB) 0.5-2.5 (3) MG/3ML nebulizer solution 3 mL  3 mL Nebulization Q6H PRN Schuh, McKenzi P, PA-C       irbesartan  (AVAPRO ) tablet 300 mg  300 mg Oral Daily Schuh, McKenzi P,  PA-C   300 mg at 11/03/23 9076   labetalol  (NORMODYNE ) injection 10 mg  10 mg Intravenous Q10 min PRN Schuh, McKenzi P, PA-C       metoprolol  succinate (TOPROL -XL) 24 hr tablet 75 mg  75 mg Oral Daily Schuh, McKenzi P, PA-C   75 mg at 11/03/23 9075   ondansetron  (ZOFRAN ) tablet 4 mg  4 mg Oral Q6H PRN Schuh, McKenzi P, PA-C       Or   ondansetron  (ZOFRAN ) injection 4 mg  4 mg Intravenous Q6H PRN Schuh, McKenzi P, PA-C       oxyCODONE  (Oxy IR/ROXICODONE ) immediate release tablet 5-10 mg  5-10 mg Oral Q4H PRN Schuh, McKenzi P, PA-C   5 mg at 11/03/23 0933   sodium  chloride flush (NS) 0.9 % injection 3 mL  3 mL Intravenous Q12H Schuh, McKenzi P, PA-C   3 mL at 11/03/23 9075   sodium chloride  flush (NS) 0.9 % injection 3 mL  3 mL Intravenous PRN Elna, McKenzi P, PA-C         Discharge Medications: Please see discharge summary for a list of discharge medications.  Relevant Imaging Results:  Relevant Lab Results:   Additional Information SSN [id number]   [facility] MWF @ 6am  - willing to go to  temp HD clinic if bed  is offered in [place] area  Montie LOISE Louder, [place]

## 2023-11-03 NOTE — Progress Notes (Signed)
 Physical Therapy Treatment Patient Details Name: Jacob Daniel MRN: [id number] DOB: 10/25/1955 Today's Date: 11/03/2023   History of Present Illness Pt is 68 year old presented to [facility] on  10/25/23 for rt foot pain and lt calf ulceration (spider bite per pt). Underwent RLE angiogram with unsuccessful attempted recanalization of the rt posterior tibial artery on 8/13. 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 PMH - CAD, ESRD on HD, HTN, HLD, A-Fib, heart failure, pulmonary hypertension,  chronic hypokalemia, OSA, failed renal transplant    PT Comments  Pt is progressing towards goals. Pt was supervision for sitting to supine, Max A for sit to stand 2x from EOB with RW and CGA for R lateral scoot. Pt has poor balance and generalized weakness in the LLE making standing difficult with poor safety awareness requiring education when performing a standing transfer was extremely unsafe. Due to pt current functional status, home set up and available assistance at home recommending skilled physical therapy services > 3 hours/day in order to address strength, balance and functional mobility to decrease risk for falls, injury, immobility, skin break down and re-hospitalization.      If plan is discharge home, recommend the following: A lot of help with bathing/dressing/bathroom;Assist for transportation;Assistance with cooking/housework;Two people to help with walking and/or transfers     Equipment Recommendations  Wheelchair (measurements PT);Wheelchair cushion (measurements PT)       Precautions / Restrictions Precautions Precautions: Fall Recall of Precautions/Restrictions: Impaired Restrictions Weight Bearing Restrictions Per Provider Order: No RLE Weight Bearing Per Provider Order: Non weight bearing     Mobility  Bed Mobility Overal bed mobility: Needs Assistance Bed Mobility: Sit to Supine       Sit to supine: Supervision        Transfers Overall transfer level: Needs assistance Equipment  used: Rolling walker (2 wheels) Transfers: Sit to/from Stand, Bed to chair/wheelchair/BSC Sit to Stand: Max assist, From elevated surface          Lateral/Scoot Transfers: Contact guard assist General transfer comment: Max A for 2x sit to stand from [facility], multi modal cues for sequencing and safe hand placement. Pt then wanted to attempt stand pivot transfer but was unable to get balance in order to perform requiring verbal education from therapist that this was not safe. Pt then performed R lateral scoot at CGA.    Ambulation/Gait     General Gait Details: unable at this time.      Balance Overall balance assessment: Needs assistance Sitting-balance support: Feet supported, Bilateral upper extremity supported Sitting balance-Leahy Scale: Fair Sitting balance - Comments: sitting BSC and EOB   Standing balance support: Bilateral upper extremity supported, Reliant on assistive device for balance, During functional activity Standing balance-Leahy Scale: Poor Standing balance comment: Pt requires Mod to Max A to stand with heavy UE support on RW for peri care. Tends to try to Wgt shift to the R        Communication Communication Communication: No apparent difficulties  Cognition Arousal: Alert Behavior During Therapy: WFL for tasks assessed/performed   PT - Cognitive impairments: No family/caregiver present to determine baseline, Problem solving, Awareness     Following commands: Impaired Following commands impaired: Only follows one step commands consistently, Follows multi-step commands inconsistently    Cueing Cueing Techniques: Verbal cues, Tactile cues, Gestural cues     General Comments General comments (skin integrity, edema, etc.): Incision closed, dry and intact.      Pertinent Vitals/Pain Pain Assessment Pain Assessment: Faces Faces Pain Scale:  Hurts little more Pain Location: Rresidual limb Pain Descriptors / Indicators: Grimacing, Guarding, Shooting,  Throbbing Pain Intervention(s): Limited activity within patient's tolerance, Monitored during session, Repositioned     PT Goals (current goals can now be found in the care plan section) Acute Rehab PT Goals Patient Stated Goal: get better PT Goal Formulation: With patient Time For Goal Achievement: 11/14/23 Potential to Achieve Goals: Good Progress towards PT goals: Progressing toward goals    Frequency    Min 2X/week      PT Plan  Continue with current POC        AM-PAC PT 6 Clicks Mobility   Outcome Measure  Help needed turning from your back to your side while in a flat bed without using bedrails?: A Little Help needed moving from lying on your back to sitting on the side of a flat bed without using bedrails?: A Little Help needed moving to and from a bed to a chair (including a wheelchair)?: A Lot Help needed standing up from a chair using your arms (e.g., wheelchair or bedside chair)?: A Lot Help needed to walk in hospital room?: Total Help needed climbing 3-5 steps with a railing? : Total 6 Click Score: 12    End of Session Equipment Utilized During Treatment: Gait belt Activity Tolerance: Patient tolerated treatment well Patient left: in bed;with call bell/phone within reach;with bed alarm set Nurse Communication: Mobility status PT Visit Diagnosis: Other abnormalities of gait and mobility (R26.89);Muscle weakness (generalized) (M62.81);Difficulty in walking, not elsewhere classified (R26.2);Pain Pain - Right/Left: Right Pain - part of body: Leg     Time: 1205-1215 PT Time Calculation (min) (ACUTE ONLY): 10 min  Charges:    $Therapeutic Activity: 8-22 mins PT General Charges $$ ACUTE PT VISIT: 1 Visit                    Dorothyann Maier, DPT, CLT  Acute Rehabilitation Services Office: [phone] (Secure chat preferred)    Dorothyann VEAR Maier 11/03/2023, 12:32 PM

## 2023-11-03 NOTE — Progress Notes (Signed)
 Physical Therapy Treatment Patient Details Name: Jacob Daniel MRN: [id number] DOB: 1955/08/25 Today's Date: 11/03/2023   History of Present Illness Pt is 68 year old presented to [facility] on  10/25/23 for rt foot pain and lt calf ulceration (spider bite per pt). Underwent RLE angiogram with unsuccessful attempted recanalization of the rt posterior tibial artery on 8/13. 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 PMH - CAD, ESRD on HD, HTN, HLD, A-Fib, heart failure, pulmonary hypertension,  chronic hypokalemia, OSA, failed renal transplant    PT Comments  Pt received in supine, alert and agreeable to therapy session, pt reporting need for [facility] and receptive to PTA plan for lateral scoot OOB to BSC. Pt needing up to minA and verbal/tactile cues for RLE NWB precs during functional mobility tasks, especially to prevent RLE knee flexion at rest and for safer technique while scooting to drop arm BSC on his L side. RN/NT notified that pt is up on BSC at end of session, RLE elevated on another chair to promote extension and for edema reduction, pt agreeable to press call bell once done attempting bowel movement. Patient will benefit from intensive inpatient follow-up therapy, >3 hours/day.     If plan is discharge home, recommend the following: A lot of help with bathing/dressing/bathroom;Assist for transportation;Assistance with cooking/housework;Two people to help with walking and/or transfers   Can travel by private Theme park manager (measurements PT);Wheelchair cushion (measurements PT);Other (comment) (consider slide board pending progress)    Recommendations for Other Services       Precautions / Restrictions Precautions Precautions: Fall Recall of Precautions/Restrictions: Impaired Precaution/Restrictions Comments: pt needs reminders to prevent WB through distal end of residual limb during bed mobility Restrictions Weight Bearing Restrictions Per Provider Order:  Yes RLE Weight Bearing Per Provider Order: Non weight bearing Other Position/Activity Restrictions: reminders needed     Mobility  Bed Mobility Overal bed mobility: Needs Assistance Bed Mobility: Supine to Sit     Supine to sit: Min assist     General bed mobility comments: increased time; to L EOB, intermittent minA to RLE to prevent R WB through end of limb and light minA bed pad assist to advance hips    Transfers Overall transfer level: Needs assistance Equipment used: None (bed pad assist) Transfers: Bed to chair/wheelchair/BSC            Lateral/Scoot Transfers: Min assist, Contact guard assist, +2 safety/equipment, From elevated surface General transfer comment: MinA initially due to pt scooting too far anterior while cued for L lateral scoot onto BSC; progressed to CGA with dense cues for UE placement and posture/body mechanics; MinA for partial squat sitting on BSC to remove bed pad prior to pt attempting BM. Defer STS trials due to pt bowel urgency to get to [facility].    Ambulation/Gait               General Gait Details: unable at this time.   Stairs             Wheelchair Mobility     Tilt Bed    Modified Rankin (Stroke Patients Only)       Balance Overall balance assessment: Needs assistance Sitting-balance support: Feet supported, Bilateral upper extremity supported Sitting balance-Leahy Scale: Fair Sitting balance - Comments: sitting BSC and EOB       Standing balance comment: defer, pt bowel urgency and pain limiting on RLE  Communication Communication Communication: No apparent difficulties  Cognition Arousal: Alert Behavior During Therapy: WFL for tasks assessed/performed   PT - Cognitive impairments: No family/caregiver present to determine baseline, Problem solving, Awareness, Safety/Judgement                       PT - Cognition Comments: Pt needs reminders on RLE NWB and safer  body mechanics; following simple commands well. Decreased insight into need to keep RLE extended/knee straight at rest, education provided Following commands: Impaired Following commands impaired: Only follows one step commands consistently, Follows multi-step commands inconsistently, Follows one step commands with increased time    Cueing Cueing Techniques: Verbal cues, Tactile cues, Gestural cues  Exercises Amputee Exercises Knee Extension: AROM, AAROM, Right, 5 reps, Supine, Seated (a couple reps in each posture; pain limiting even light tactile cues)    General Comments General comments (skin integrity, edema, etc.): incision appears closed, some scant red appearing drainage on bed pad under him, some appearing dried and a couple drops appear more bright red; no visible draining while transferrring and once pt resting on BSC however; no shrinker sock seen in his room.      Pertinent Vitals/Pain Pain Assessment Pain Assessment: Faces Faces Pain Scale: Hurts even more Pain Location: Rresidual limb, esp with knee extension/elevation in seated posture Pain Descriptors / Indicators: Grimacing, Guarding, Shooting, Throbbing, Operative site guarding Pain Intervention(s): Limited activity within patient's tolerance, Monitored during session, Repositioned, Other (comment) (RN notified; limb elevated slightly and support under it to promote R knee extension)    Home Living                          Prior Function            PT Goals (current goals can now be found in the care plan section) Acute Rehab PT Goals Patient Stated Goal: get better PT Goal Formulation: With patient Time For Goal Achievement: 11/14/23 Potential to Achieve Goals: Good Progress towards PT goals: Progressing toward goals    Frequency    Min 2X/week      PT Plan      Co-evaluation              AM-PAC PT 6 Clicks Mobility   Outcome Measure  Help needed turning from your back to your  side while in a flat bed without using bedrails?: A Little Help needed moving from lying on your back to sitting on the side of a flat bed without using bedrails?: A Little Help needed moving to and from a bed to a chair (including a wheelchair)?: A Lot (+2 safety) Help needed standing up from a chair using your arms (e.g., wheelchair or bedside chair)?: Total Help needed to walk in hospital room?: Total Help needed climbing 3-5 steps with a railing? : Total 6 Click Score: 11    End of Session Equipment Utilized During Treatment: Gait belt;Other (comment) (RN/surgeon messaged post session as no shrinker sock seen in his room, or could ace wrap limb if needed, PTA defer to MD) Activity Tolerance: Patient tolerated treatment well;Patient limited by pain Patient left: in chair;with call bell/phone within reach;Other (comment) (pt up on drop arm BSC, RN/NT aware) Nurse Communication: Mobility status;Precautions;Patient requests pain meds;Other (comment) (pt may need ace wrap vs shrinker sock for RLE now that dressing has been removed; MD/RN messaged to inquire what surgeon prefers for him) PT Visit Diagnosis: Other abnormalities of gait  and mobility (R26.89);Muscle weakness (generalized) (M62.81);Difficulty in walking, not elsewhere classified (R26.2);Pain Pain - Right/Left: Right Pain - part of body: Leg     Time: 8862-8846 PT Time Calculation (min) (ACUTE ONLY): 16 min  Charges:    $Therapeutic Activity: 8-22 mins PT General Charges $$ ACUTE PT VISIT: 1 Visit                     Maida Widger P., PTA Acute Rehabilitation Services Secure Chat Preferred 9a-5:30pm Office: [phone]    Connell CHRISTELLA Blue 11/03/2023, 3:40 PM

## 2023-11-03 NOTE — Progress Notes (Addendum)
  Progress Note    11/03/2023 8:55 AM 5 Days Post-Op  Subjective:  says his right leg is a little sore    Vitals:   11/03/23 0423 11/03/23 0751  BP: 132/84 (!) 149/100  Pulse: (!) 53 (!) 50  Resp: 16 16  Temp: 98 F (36.7 C) 98.1 F (36.7 C)  SpO2: 100% 100%    Physical Exam: General:  laying in bed, NAD Lungs:  nonlabored Incisions:  right BKA healing well with staples intact. Some bruising but no hematoma  CBC    Component Value Date/Time   WBC 10.1 11/01/2023 0458   RBC 3.30 (L) 11/01/2023 0458   HGB 9.4 (L) 11/01/2023 0458   HCT 28.8 (L) 11/01/2023 0458   PLT 135 (L) 11/01/2023 0458   MCV 87.3 11/01/2023 0458   MCH 28.5 11/01/2023 0458   MCHC 32.6 11/01/2023 0458   RDW 17.5 (H) 11/01/2023 0458   LYMPHSABS 0.9 11/01/2023 0458   MONOABS 0.7 11/01/2023 0458   EOSABS 0.5 11/01/2023 0458   BASOSABS 0.1 11/01/2023 0458    BMET    Component Value Date/Time   NA 134 (L) 11/01/2023 0458   K 4.2 11/01/2023 0458   CL 95 (L) 11/01/2023 0458   CO2 23 11/01/2023 0458   GLUCOSE 84 11/01/2023 0458   BUN 40 (H) 11/01/2023 0458   CREATININE 9.07 (H) 11/01/2023 0458   CALCIUM  9.3 11/01/2023 0458   GFRNONAA 6 (L) 11/01/2023 0458    INR    Component Value Date/Time   INR 1.3 (H) 10/25/2023 1123     Intake/Output Summary (Last 24 hours) at 11/03/2023 0855 Last data filed at 11/02/2023 1700 Gross per 24 hour  Intake 360 ml  Output 3000 ml  Net -2640 ml      Assessment/Plan:  68 y.o. male is 5 days post op, s/p: right BKA   - He is doing okay this morning, reports continued soreness at his amputation site -I have taken the dressing off his right BKA today.  His amputation site seems to be healing well.  His incision is intact and dry.  There is some mild bruising present, however no hematoma -Dry dressing changes as needed to the right BKA stump -Continue to mobilize with PT/OT -Stable for discharge from vascular perspective.  Will arrange follow-up in 4  to 5 weeks for staple removal   Ahmed Holster, PA-C [facility] [phone] 11/03/2023 8:55 AM  VASCULAR STAFF ADDENDUM: I agree with the above.  Stump sock. Will need alternative living situation as he was living by himself   Fonda FORBES Rim MD [facility] Phone Number: [phone] 11/03/2023 10:08 PM

## 2023-11-04 DIAGNOSIS — I70201 Unspecified atherosclerosis of native arteries of extremities, right leg: Secondary | ICD-10-CM | POA: Diagnosis not present

## 2023-11-04 MED ORDER — OXYCODONE HCL 5 MG PO TABS
ORAL_TABLET | ORAL | Status: AC
  Filled 2023-11-04: qty 1

## 2023-11-04 MED ORDER — ORAL CARE MOUTH RINSE: 15.0000 mL | OROMUCOSAL | Status: DC | PRN

## 2023-11-04 MED ORDER — OXYCODONE HCL 5 MG PO TABS: 5.0000 mg | ORAL_TABLET | ORAL | 0 refills | Status: DC | PRN
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
## 2023-11-04 NOTE — Progress Notes (Signed)
 [facility] Progress Note   Subjective:  Seen in his room. He reports that he did not sleep well because he felt that staff was taking his chain and he reports that 20$ was missing from his wallet. [employer] was called. Staff feels that he was confused and possibly delirious. Plan for HD today. BP higher today - he needs HD.   Objective Vitals:   11/04/23 0700 11/04/23 0800 11/04/23 0900 11/04/23 0912  BP:    (!) 156/93  Pulse:    (!) 55  Resp: (!) 25 10 11 16   Temp:    98.1 F (36.7 C)  TempSrc:    Oral  SpO2:    94%  Weight:      Height:       Physical Exam General:  alert, lying bed, nad  Heart: RRR  Lungs: Clear bilaterally  Abdomen: non tender  Extremities:no LE edema, R BKA Dialysis Access: LU AVF   Filed Weights   11/02/23 1117 11/03/23 0423 11/04/23 0426  Weight: 75.7 kg 78.2 kg 79.3 kg    Intake/Output Summary (Last 24 hours) at 11/04/2023 1136 Last data filed at 11/03/2023 1200 Gross per 24 hour  Intake 240 ml  Output --  Net 240 ml    Additional Objective Labs: Basic Metabolic Panel: Recent Labs  Lab 10/30/23 0331 10/31/23 0342 11/01/23 0458  NA 133* 134* 134*  K 3.8 4.3 4.2  CL 94* 96* 95*  CO2 26 22 23   GLUCOSE 80 101* 84  BUN 19 28* 40*  CREATININE 5.24* 7.68* 9.07*  CALCIUM  8.8* 9.0 9.3  PHOS  --  4.9*  --     CBC: Recent Labs  Lab 10/29/23 0330 10/30/23 0331 10/31/23 0342 11/01/23 0458  WBC 7.0 10.2 12.6* 10.1  NEUTROABS  --   --  10.6* 7.8*  HGB 10.2* 10.0* 10.3* 9.4*  HCT 31.8* 30.9* 31.6* 28.8*  MCV 87.6 87.5 87.1 87.3  PLT 141* 149* 117* 135*    Medications:    apixaban   5 mg Oral BID   aspirin  EC  81 mg Oral Daily   atorvastatin   10 mg Oral Daily   calcitRIOL   1.75 mcg Oral Q M,W,F-1800   calcium  acetate  2,001 mg Oral TID WC   Chlorhexidine  Gluconate Cloth  6 each Topical Q0600   cinacalcet   30 mg Oral Q M,W,F-1800   ferrous sulfate   325 mg Oral Q breakfast   gabapentin   100 mg Oral QHS   irbesartan    300 mg Oral Daily   metoprolol  succinate  75 mg Oral Daily   sodium chloride  flush  3 mL Intravenous Q12H    Dialysis Orders: [facility]  on MWF . EDW 84kg HD Bath 2K/2.5Ca  Time 4:00 Heparin  1000 bolus then 600 units/hr. 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  VVS following.   Left lower extremity cellulitis - s/p IV antibiotics.  ESRD -  HD on MWF. HD off schedule recent d/t staffing/inpatient census. Plan next HD 11/04/23.  Hypertension/volume  - BP better with UF.  Under EDW now. Will need lower EDW on d/c post BKA.  UF as tolerated. Anemia  - Hgb 9.4. Received Aranesp  100 on 8/19.  Metabolic bone disease - Calcium  in goal. Phos 4.9.  Continue VDRA, sensipar  and binders.  Dispo - for SNF placement.   Belvie Och, NP [facility] 11/04/2023,11:36 AM

## 2023-11-04 NOTE — TOC Progression Note (Signed)
 Transition of Care [facility]) - Progression Note    Patient Details  Name: Jacob Daniel MRN: [id number] Date of Birth: 04-08-1955  Transition of [facility]) CM/SW Contact  Montie LOISE Louder, [place] Phone Number: 11/04/2023, 11:57 AM  Clinical Narrative:     CSW met with patient at bedside- provided bed offers. Patient remains agreeable to short term rehab at [facility] and going to HD clinic in [place] while at rehab ( home HD clinic is [facility]) Patient will review and inform CSW of choice.   Montie Louder, MSW, LCSW Clinical Social Worker     Expected Discharge Plan: Skilled Nursing Facility Barriers to Discharge: Insurance Authorization, SNF Pending bed offer (HD Clinic)               Expected Discharge Plan and Services                                               Social Drivers of Health (SDOH) Interventions SDOH Screenings   Food Insecurity: No Food Insecurity (10/25/2023)  Housing: Low Risk  (10/25/2023)  Transportation Needs: No Transportation Needs (10/25/2023)  Utilities: Not At Risk (10/25/2023)  Financial Resource Strain: Low Risk  (11/05/2022)   Received from [facility]  Social Connections: Moderately Integrated (10/25/2023)  Tobacco Use: Low Risk  (10/29/2023)    Readmission Risk Interventions    05/05/2023   12:37 PM  Readmission Risk Prevention Plan  Transportation Screening Complete  Medication Review (RN Care Manager) Referral to Pharmacy  HRI or Home Care Consult Complete  SW Recovery Care/Counseling Consult Complete  Palliative Care Screening Not Applicable  Skilled Nursing Facility Not Applicable

## 2023-11-04 NOTE — Progress Notes (Signed)
 PROGRESS NOTE    Jacob Daniel  FMW:[id number] DOB: [birth year] DOA: 10/25/2023 PCP: Pcp, No    Brief Narrative:  68 year old with history of peripheral vascular disease, ESRD on hemodialysis presented to the emergency room with right foot pain and swelling for 3 days.  Bit by a spider on the left calf 7 days ago treated with doxycycline .  He had a great right toe pain and swelling of the right foot as well. Patient was admitted, found to have critical limb ischemia treated with heparin  and antibiotics.  Found to have severe atherosclerotic disease throughout on both legs.  Attempted recanalization of right posterior tibial artery that was unsuccessful.  Had no other options.  Underwent right-sided BKA. Waiting for skilled nursing facility.  Subjective:  Patient seen and examined.  To me he denies any complaints.  Overnight events noted.  He got confused, he called police however his belongings were found with him.  Pain is controlled.  For dialysis today.   Assessment & Plan:   Severe peripheral vascular disease, right lower extremity ischemia: Treated with antibiotics.  Ultimately underwent right BKA.  Continue PT OT.  Postop surgical management as per surgery.  Antibiotics discontinued.  Left lower extremity cellulitis, nonhealing wound left calf believed to be spider bite:  Conservative management.  Completed 7 days of IV antibiotics.  Close follow-up with vascular surgery as outpatient.  ESRD on hemodialysis, getting dialysis on a schedule,  Essential hypertension stable on irbesartan  and metoprolol .  Hyperlipidemia, stable on statin  Paroxysmal A-fib, currently sinus rhythm.  Back on Eliquis .  Anemia of chronic disease, stable  Metabolic bone disease, stable  Medically stable to transition to skilled nursing facility.  Can transfer to MedSurg bed.   DVT prophylaxis:  apixaban  (ELIQUIS ) tablet 5 mg   Code Status: Full code Family Communication: None at the  bedside Disposition Plan: Status is: Inpatient Remains inpatient appropriate because: Waiting for SNF bed.  Medically stable.     Consultants:  Vascular surgery Nephrology  Procedures:  Right BKA  Antimicrobials:  Completed     Objective: Vitals:   11/04/23 0700 11/04/23 0800 11/04/23 0900 11/04/23 0912  BP:    (!) 156/93  Pulse:    (!) 55  Resp: (!) 25 10 11 16   Temp:    98.1 F (36.7 C)  TempSrc:    Oral  SpO2:    94%  Weight:      Height:        Intake/Output Summary (Last 24 hours) at 11/04/2023 1112 Last data filed at 11/03/2023 1200 Gross per 24 hour  Intake 240 ml  Output --  Net 240 ml   Filed Weights   11/02/23 1117 11/03/23 0423 11/04/23 0426  Weight: 75.7 kg 78.2 kg 79.3 kg    Examination:  General exam: Calm and comfortable.  Slightly distracted today. Respiratory system: Clear to auscultation. Respiratory effort normal.  No added sounds. Cardiovascular system: S1 & S2 heard, RRR.  No pedal edema. Gastrointestinal system: Abdomen is nondistended, soft and nontender. No organomegaly or masses felt. Normal bowel sounds heard. Central nervous system: Alert and oriented. No focal neurological deficits. Extremities: Symmetric 5 x 5 power. Skin: Right below-knee amputation stump with staples intact.  Some serosanguineous drainage present.  Left leg posterior area with a 4 x 4 centimeter superficial ulceration.    Data Reviewed: I have personally reviewed following labs and imaging studies  CBC: Recent Labs  Lab 10/29/23 0330 10/30/23 0331 10/31/23 0342 11/01/23 0458  WBC  7.0 10.2 12.6* 10.1  NEUTROABS  --   --  10.6* 7.8*  HGB 10.2* 10.0* 10.3* 9.4*  HCT 31.8* 30.9* 31.6* 28.8*  MCV 87.6 87.5 87.1 87.3  PLT 141* 149* 117* 135*   Basic Metabolic Panel: Recent Labs  Lab 10/29/23 0330 10/30/23 0331 10/31/23 0342 11/01/23 0458  NA 134* 133* 134* 134*  K 4.0 3.8 4.3 4.2  CL 94* 94* 96* 95*  CO2 26 26 22 23   GLUCOSE 69* 80 101* 84   BUN 35* 19 28* 40*  CREATININE 7.78* 5.24* 7.68* 9.07*  CALCIUM  9.1 8.8* 9.0 9.3  PHOS  --   --  4.9*  --    GFR: Estimated Creatinine Clearance: 8.7 mL/min (A) (by C-G formula based on SCr of 9.07 mg/dL (H)). Liver Function Tests: No results for input(s): AST, ALT, ALKPHOS, BILITOT, PROT, ALBUMIN  in the last 168 hours. No results for input(s): LIPASE, AMYLASE in the last 168 hours. No results for input(s): AMMONIA in the last 168 hours. Coagulation Profile: No results for input(s): INR, PROTIME in the last 168 hours. Cardiac Enzymes: No results for input(s): CKTOTAL, CKMB, CKMBINDEX, TROPONINI in the last 168 hours. BNP (last 3 results) No results for input(s): PROBNP in the last 8760 hours. HbA1C: No results for input(s): HGBA1C in the last 72 hours. CBG: Recent Labs  Lab 10/29/23 1111 10/29/23 1219 10/29/23 1521  GLUCAP 68* 126* 74   Lipid Profile: No results for input(s): CHOL, HDL, LDLCALC, TRIG, CHOLHDL, LDLDIRECT in the last 72 hours. Thyroid Function Tests: No results for input(s): TSH, T4TOTAL, FREET4, T3FREE, THYROIDAB in the last 72 hours. Anemia Panel: No results for input(s): VITAMINB12, FOLATE, FERRITIN, TIBC, IRON, RETICCTPCT in the last 72 hours. Sepsis Labs: No results for input(s): PROCALCITON, LATICACIDVEN in the last 168 hours.   Recent Results (from the past 240 hours)  Culture, blood (routine x 2)     Status: None   Collection Time: 10/25/23 11:23 AM   Specimen: BLOOD  Result Value Ref Range Status   Specimen Description BLOOD SITE NOT SPECIFIED  Final   Special Requests   Final    BOTTLES DRAWN AEROBIC AND ANAEROBIC Blood Culture adequate volume   Culture   Final    NO GROWTH 5 DAYS Performed at [facility], [address], [place], [place] [address]    Report Status 10/30/2023 FINAL  Final  Culture, blood (routine x 2)     Status: None   Collection Time: 10/25/23  11:56 AM   Specimen: BLOOD  Result Value Ref Range Status   Specimen Description BLOOD SITE NOT SPECIFIED  Final   Special Requests   Final    BOTTLES DRAWN AEROBIC AND ANAEROBIC Blood Culture results may not be optimal due to an inadequate volume of blood received in culture bottles   Culture   Final    NO GROWTH 5 DAYS Performed at [facility], [address], [place], [place] [address]    Report Status 10/30/2023 FINAL  Final  Surgical PCR screen     Status: None   Collection Time: 10/28/23 11:51 AM   Specimen: Nasal Mucosa; Nasal Swab  Result Value Ref Range Status   MRSA, PCR NEGATIVE NEGATIVE Final   Staphylococcus aureus NEGATIVE NEGATIVE Final    Comment: (NOTE) The Xpert SA Assay (FDA approved for NASAL specimens in patients 21 years of age and older), is one component of a comprehensive surveillance program. It is not intended to diagnose infection  nor to guide or monitor treatment. Performed at [facility], [address] [place], [place] [address]          Radiology Studies: No results found.      Scheduled Meds:  apixaban   5 mg Oral BID   aspirin  EC  81 mg Oral Daily   atorvastatin   10 mg Oral Daily   calcitRIOL   1.75 mcg Oral Q M,W,F-1800   calcium  acetate  2,001 mg Oral TID WC   Chlorhexidine  Gluconate Cloth  6 each Topical Q0600   cinacalcet   30 mg Oral Q M,W,F-1800   ferrous sulfate   325 mg Oral Q breakfast   gabapentin   100 mg Oral QHS   irbesartan   300 mg Oral Daily   metoprolol  succinate  75 mg Oral Daily   sodium chloride  flush  3 mL Intravenous Q12H   Continuous Infusions:   LOS: 10 days    Time spent: 35 minutes    Renato Applebaum, MD [facility]

## 2023-11-05 DIAGNOSIS — I70201 Unspecified atherosclerosis of native arteries of extremities, right leg: Secondary | ICD-10-CM | POA: Diagnosis not present

## 2023-11-05 MED ORDER — HYDROMORPHONE HCL 1 MG/ML IJ SOLN
INTRAMUSCULAR | Status: AC
  Filled 2023-11-05: qty 1

## 2023-11-05 MED ORDER — CHLORHEXIDINE GLUCONATE CLOTH 2 % EX PADS
6.0000 | MEDICATED_PAD | Freq: Every day | CUTANEOUS | Status: DC
  Administered 2023-11-06 – 2023-11-07 (×2): 6 via TOPICAL

## 2023-11-05 NOTE — Progress Notes (Signed)
 Physical Therapy Treatment Patient Details Name: Jacob Daniel MRN: [id number] DOB: June 28, 1955 Today's Date: 11/05/2023   History of Present Illness Pt is 68 year old presented to [facility]  10/[facility]/23 for rt foot pain and lt calf ulceration (spider bite per pt). Underwent RLE angiogram with unsuccessful attempted recanalization of the rt posterior tibial artery on 8/13. 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 PMH - CAD, ESRD on HD, HTN, HLD, A-Fib, heart failure, pulmonary hypertension,  chronic hypokalemia, OSA, failed renal transplant    PT Comments  Very slow progress towards goals. Pt mentation seems to be declining and pt has less safety awareness and more confidence in his own mobility capabilities despite requiring Max A for standing with heavy R lean requiring therapist to prevent pt from falling on R residual limb at previous session and Min A for scoot transfers with assist at posterior thigh on the RLE for support to prevent coming to far forward during lateral scoot transfer. Pt was able to move LE toward EOB this session and then stated he was tired and was going to rest the rest of the day. Pt encouraged and provided education but then became slightly agitated. Due to pt current functional status, home set up and available assistance at home recommending skilled physical therapy services < 3 hours/day in order to address strength, balance and functional mobility to decrease risk for falls, injury, immobility, skin break down and re-hospitalization.      If plan is discharge home, recommend the following: A lot of help with bathing/dressing/bathroom;Assist for transportation;Assistance with cooking/housework;Two people to help with walking and/or transfers   Can travel by private vehicle     No  Equipment Recommendations  Wheelchair (measurements PT);Wheelchair cushion (measurements PT);Hospital bed;Hoyer lift       Precautions / Restrictions Precautions Precautions: Fall Recall of  Precautions/Restrictions: Impaired Precaution/Restrictions Comments: pt needs reminders to prevent WB through distal end of residual limb during bed mobility Restrictions Weight Bearing Restrictions Per Provider Order: Yes RLE Weight Bearing Per Provider Order: Non weight bearing Other Position/Activity Restrictions: reminders needed     Mobility  Bed Mobility   General bed mobility comments: Increased time for movement of LE then pt reported he was not going to get up. Pt was adamant despite education and encouragement to even just sit EOB and perform LE ther-ex    Transfers   General transfer comment: not performed today           Communication Communication Communication: No apparent difficulties  Cognition Arousal: Alert Behavior During Therapy: Flat affect, Agitated   PT - Cognitive impairments: No family/caregiver present to determine baseline, Problem solving, Awareness, Safety/Judgement                       PT - Cognition Comments: Pt very flat today; slightly agitated Following commands: Impaired Following commands impaired: Only follows one step commands consistently, Follows one step commands inconsistently    Cueing Cueing Techniques: Verbal cues, Tactile cues, Gestural cues     General Comments General comments (skin integrity, edema, etc.): Pt deferred mobility today. Able to move LE but requires some assistance. Pt then stating that he needs sleep and that he is able to move ind. Pt requires Max A for sit to stand with a heavy R lean due to poor kinesthetic awareness and difficulty orienting to no RLE support. Education and encouragement for mobility with multiple options provided yet pt continued to decline mobility stating that he was able  to mobilize on his own and he needs to sleep.      Pertinent Vitals/Pain Pain Assessment Pain Assessment: No/denies pain     PT Goals (current goals can now be found in the care plan section) Acute Rehab PT  Goals Patient Stated Goal: get better PT Goal Formulation: With patient Time For Goal Achievement: 11/14/23 Potential to Achieve Goals: Good Progress towards PT goals: Progressing toward goals    Frequency    Min 2X/week      PT Plan  Changed discharged recommendations       AM-PAC PT 6 Clicks Mobility   Outcome Measure  Help needed turning from your back to your side while in a flat bed without using bedrails?: A Little Help needed moving from lying on your back to sitting on the side of a flat bed without using bedrails?: A Little Help needed moving to and from a bed to a chair (including a wheelchair)?: Total Help needed standing up from a chair using your arms (e.g., wheelchair or bedside chair)?: Total Help needed to walk in hospital room?: Total Help needed climbing 3-5 steps with a railing? : Total 6 Click Score: 10    End of Session   Activity Tolerance: Other (comment) (limited due to mentation) Patient left: in bed;with call bell/phone within reach;with bed alarm set Nurse Communication: Other (comment) (pt mentation) Pain - Right/Left: Right Pain - part of body: Leg     Time: 1300-1317 PT Time Calculation (min) (ACUTE ONLY): 17 min  Charges:    $Therapeutic Activity: 8-22 mins PT General Charges $$ ACUTE PT VISIT: 1 Visit                     Dorothyann Maier, DPT, CLT  Acute Rehabilitation Services Office: [phone] (Secure chat preferred)    Dorothyann VEAR Maier 11/05/2023, 1:23 PM

## 2023-11-05 NOTE — Progress Notes (Addendum)
 Contacted by SW regarding new op HD placement due to now going to SNF. Contacted [facility] to notify of this change. Closest clinics are [facility] to the SNF, therefore a new referral will need to be made. Will now meet with pt to inform and work on completing referral.   Jacob Daniel Dialysis Navigator [phone]  Addendum 1:29pm Met with pt at bedside to inform of plan to receive dialysis at a different [place] [facility] while at SNF short term. Pt agreeable at this time. CLIP completed to [facility] admissions. Contacted admission to please advise. Informed renal NP and SW at this time. Now awaiting determination and financial clearance. Will update as applicable.

## 2023-11-05 NOTE — TOC Progression Note (Signed)
 Transition of Care [facility]) - Progression Note    Patient Details  Name: Jacob Daniel MRN: [id number] Date of Birth: 12-Mar-1956  [facility] CM/SW Contact  Montie LOISE Louder, [place] Phone Number: 11/05/2023, 10:22 AM  Clinical Narrative:     CSW met with patient - SNF choice [facility] or  [facility]. Unable to confirm transportation agency for [facility], which is required because SNF does not transport to HD clinic-   followed up with [facility]- SNF confirmed availability and they can transport to local HD clinic - updated Renal Nav (secured chat)to begin search for  temp/HD clinic in [place]. Waiting on confirmation  TOC will continue to follow and assist with discharge planning.  Montie Louder, MSW, LCSW Clinical Social Worker    Expected Discharge Plan: Skilled Nursing Facility Barriers to Discharge: Insurance Authorization, SNF Pending bed offer (HD Clinic)               Expected Discharge Plan and Services                                               Social Drivers of Health (SDOH) Interventions SDOH Screenings   Food Insecurity: No Food Insecurity (10/25/2023)  Housing: Low Risk  (10/25/2023)  Transportation Needs: No Transportation Needs (10/25/2023)  Utilities: Not At Risk (10/25/2023)  Financial Resource Strain: Low Risk  (11/05/2022)   Received from [facility]  Social Connections: Moderately Integrated (10/25/2023)  Tobacco Use: Low Risk  (10/29/2023)    Readmission Risk Interventions    05/05/2023   12:37 PM  Readmission Risk Prevention Plan  Transportation Screening Complete  Medication Review (RN Care Manager) Referral to Pharmacy  HRI or Home Care Consult Complete  SW Recovery Care/Counseling Consult Complete  Palliative Care Screening Not Applicable  Skilled Nursing Facility Not Applicable

## 2023-11-05 NOTE — Progress Notes (Signed)
 PROGRESS NOTE    Josehua Hammar  FMW:[id number] DOB: [birth year] DOA: 10/25/2023 PCP: Pcp, No    Brief Narrative:  68 year old with history of peripheral vascular disease, ESRD on hemodialysis presented to the emergency room with right foot pain and swelling for 3 days.  Bit by a spider on the left calf 7 days ago treated with doxycycline .  He had a great right toe pain and swelling of the right foot as well. Patient was admitted, found to have critical limb ischemia treated with heparin  and antibiotics.  Found to have severe atherosclerotic disease throughout on both legs.  Attempted recanalization of right posterior tibial artery that was unsuccessful.  Had no other options.  Underwent right-sided BKA. Waiting for skilled nursing facility.  Subjective:  Patient seen and examined.  No overnight events.  Waiting for SNF.   Assessment & Plan:   Severe peripheral vascular disease, right lower extremity ischemia: Treated with antibiotics.  Ultimately underwent right BKA.  Continue PT OT.  Postop surgical management as per surgery.  Antibiotics completed.  Discontinued.  Left lower extremity cellulitis, nonhealing wound left calf believed to be spider bite:  Conservative management.  Completed 7 days of IV antibiotics.  Close follow-up with vascular surgery as outpatient.  ESRD on hemodialysis, getting dialysis on a schedule,  Essential hypertension stable on irbesartan  and metoprolol .  Hyperlipidemia, stable on statin  Paroxysmal A-fib, currently sinus rhythm.  Back on Eliquis .  Anemia of chronic disease, stable  Metabolic bone disease, stable  Medically stable to transition to skilled nursing facility.  Can transfer to MedSurg bed.   DVT prophylaxis:  apixaban  (ELIQUIS ) tablet 5 mg   Code Status: Full code Family Communication: None at the bedside Disposition Plan: Status is: Inpatient Remains inpatient appropriate because: Waiting for SNF bed.  Medically stable.      Consultants:  Vascular surgery Nephrology  Procedures:  Right BKA  Antimicrobials:  Completed     Objective: Vitals:   11/04/23 1939 11/04/23 2316 11/05/23 0359 11/05/23 0736  BP: (!) 143/95 (!) 151/87 (!) 140/82 (!) 141/82  Pulse: (!) 55 (!) 52 (!) 51 (!) 43  Resp: 20 20 20 20   Temp: 97.8 F (36.6 C) 97.6 F (36.4 C) 97.8 F (36.6 C) 98.2 F (36.8 C)  TempSrc: Oral Oral Oral Oral  SpO2: 94% 91% 92% 98%  Weight:      Height:        Intake/Output Summary (Last 24 hours) at 11/05/2023 1106 Last data filed at 11/04/2023 1714 Gross per 24 hour  Intake --  Output 3000 ml  Net -3000 ml   Filed Weights   11/04/23 0426 11/04/23 1245 11/04/23 1714  Weight: 79.3 kg 78.7 kg 76.5 kg    Examination:  General exam: Pleasant and interactive. Respiratory system: Clear to auscultation. Respiratory effort normal.  No added sounds. Cardiovascular system: S1 & S2 heard, RRR.  No pedal edema. Gastrointestinal system: Abdomen is nondistended, soft and nontender. No organomegaly or masses felt. Normal bowel sounds heard. Central nervous system: Alert and oriented. No focal neurological deficits. Extremities: Symmetric 5 x 5 power. Skin: Right below-knee amputation stump with staples intact.  Some serosanguineous drainage present.  Left leg posterior area with a 4 x 4 centimeter superficial ulceration.    Data Reviewed: I have personally reviewed following labs and imaging studies  CBC: Recent Labs  Lab 10/30/23 0331 10/31/23 0342 11/01/23 0458  WBC 10.2 12.6* 10.1  NEUTROABS  --  10.6* 7.8*  HGB 10.0* 10.3* 9.4*  HCT 30.9* 31.6* 28.8*  MCV 87.5 87.1 87.3  PLT 149* 117* 135*   Basic Metabolic Panel: Recent Labs  Lab 10/30/23 0331 10/31/23 0342 11/01/23 0458  NA 133* 134* 134*  K 3.8 4.3 4.2  CL 94* 96* 95*  CO2 26 22 23   GLUCOSE 80 101* 84  BUN 19 28* 40*  CREATININE 5.24* 7.68* 9.07*  CALCIUM  8.8* 9.0 9.3  PHOS  --  4.9*  --    GFR: Estimated  Creatinine Clearance: 8.4 mL/min (A) (by C-G formula based on SCr of 9.07 mg/dL (H)). Liver Function Tests: No results for input(s): AST, ALT, ALKPHOS, BILITOT, PROT, ALBUMIN  in the last 168 hours. No results for input(s): LIPASE, AMYLASE in the last 168 hours. No results for input(s): AMMONIA in the last 168 hours. Coagulation Profile: No results for input(s): INR, PROTIME in the last 168 hours. Cardiac Enzymes: No results for input(s): CKTOTAL, CKMB, CKMBINDEX, TROPONINI in the last 168 hours. BNP (last 3 results) No results for input(s): PROBNP in the last 8760 hours. HbA1C: No results for input(s): HGBA1C in the last 72 hours. CBG: Recent Labs  Lab 10/29/23 1111 10/29/23 1219 10/29/23 1521  GLUCAP 68* 126* 74   Lipid Profile: No results for input(s): CHOL, HDL, LDLCALC, TRIG, CHOLHDL, LDLDIRECT in the last 72 hours. Thyroid Function Tests: No results for input(s): TSH, T4TOTAL, FREET4, T3FREE, THYROIDAB in the last 72 hours. Anemia Panel: No results for input(s): VITAMINB12, FOLATE, FERRITIN, TIBC, IRON, RETICCTPCT in the last 72 hours. Sepsis Labs: No results for input(s): PROCALCITON, LATICACIDVEN in the last 168 hours.   Recent Results (from the past 240 hours)  Surgical PCR screen     Status: None   Collection Time: 10/28/23 11:51 AM   Specimen: Nasal Mucosa; Nasal Swab  Result Value Ref Range Status   MRSA, PCR NEGATIVE NEGATIVE Final   Staphylococcus aureus NEGATIVE NEGATIVE Final    Comment: (NOTE) The Xpert SA Assay (FDA approved for NASAL specimens in patients 69 years of age and older), is one component of a comprehensive surveillance program. It is not intended to diagnose infection nor to guide or monitor treatment. Performed at [facility], [address] [place], [place] [address]          Radiology Studies: No results found.      Scheduled Meds:  apixaban   5  mg Oral BID   aspirin  EC  81 mg Oral Daily   atorvastatin   10 mg Oral Daily   calcitRIOL   1.75 mcg Oral Q M,W,F-1800   calcium  acetate  2,001 mg Oral TID WC   Chlorhexidine  Gluconate Cloth  6 each Topical Q0600   cinacalcet   30 mg Oral Q M,W,F-1800   ferrous sulfate   325 mg Oral Q breakfast   gabapentin   100 mg Oral QHS   irbesartan   300 mg Oral Daily   metoprolol  succinate  75 mg Oral Daily   sodium chloride  flush  3 mL Intravenous Q12H   Continuous Infusions:   LOS: 11 days    Time spent: 35 minutes    Renato Applebaum, MD [facility]

## 2023-11-05 NOTE — Progress Notes (Signed)
 [facility] Progress Note   Subjective:  Patient seen and examined in his room. No reports of delerium or confusion overnight. Patient planned to d/c to SNF and we are working to get him clipped to a [facility] clinic close to his SNF. BP higher today.  Had HD on 11/04/23 and we were able to remove 3L during UF. Next HD 11/06/23  Objective Vitals:   11/04/23 2316 11/05/23 0359 11/05/23 0736 11/05/23 1140  BP: (!) 151/87 (!) 140/82 (!) 141/82 (!) 150/83  Pulse: (!) 52 (!) 51 (!) 43 (!) 50  Resp: 20 20 20 16   Temp: 97.6 F (36.4 C) 97.8 F (36.6 C) 98.2 F (36.8 C) 98.2 F (36.8 C)  TempSrc: Oral Oral Oral Oral  SpO2: 91% 92% 98% 100%  Weight:      Height:       Physical Exam General:  alert, lying bed, nad  Heart: RRR  Lungs: Clear bilaterally  Abdomen: non tender  Extremities:no LE edema, R BKA Dialysis Access: LU AVF   Filed Weights   11/04/23 0426 11/04/23 1245 11/04/23 1714  Weight: 79.3 kg 78.7 kg 76.5 kg    Intake/Output Summary (Last 24 hours) at 11/05/2023 1144 Last data filed at 11/04/2023 1714 Gross per 24 hour  Intake --  Output 3000 ml  Net -3000 ml    Additional Objective Labs: Basic Metabolic Panel: Recent Labs  Lab 10/30/23 0331 10/31/23 0342 11/01/23 0458  NA 133* 134* 134*  K 3.8 4.3 4.2  CL 94* 96* 95*  CO2 26 22 23   GLUCOSE 80 101* 84  BUN 19 28* 40*  CREATININE 5.24* 7.68* 9.07*  CALCIUM  8.8* 9.0 9.3  PHOS  --  4.9*  --     CBC: Recent Labs  Lab 10/30/23 0331 10/31/23 0342 11/01/23 0458  WBC 10.2 12.6* 10.1  NEUTROABS  --  10.6* 7.8*  HGB 10.0* 10.3* 9.4*  HCT 30.9* 31.6* 28.8*  MCV 87.5 87.1 87.3  PLT 149* 117* 135*    Medications:    apixaban   5 mg Oral BID   aspirin  EC  81 mg Oral Daily   atorvastatin   10 mg Oral Daily   calcitRIOL   1.75 mcg Oral Q M,W,F-1800   calcium  acetate  2,001 mg Oral TID WC   Chlorhexidine  Gluconate Cloth  6 each Topical Q0600   cinacalcet   30 mg Oral Q M,W,F-1800   ferrous  sulfate  325 mg Oral Q breakfast   gabapentin   100 mg Oral QHS   irbesartan   300 mg Oral Daily   metoprolol  succinate  75 mg Oral Daily   sodium chloride  flush  3 mL Intravenous Q12H    Dialysis Orders: [facility]  on MWF . EDW 84kg HD Bath 2K/2.5Ca  Time 4:00 Heparin  1000 bolus then 600 units/hr. 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  VVS following.   Left lower extremity cellulitis - s/p IV antibiotics.  ESRD -  HD on MWF. HD off schedule recent d/t staffing/inpatient census. Plan next HD 11/06/23.  Hypertension/volume  - BP better with UF.  Under EDW now. Will need lower EDW on d/c post BKA.  UF as tolerated. Anemia  - Hgb 9.4. Received Aranesp  100 on 8/19.  Metabolic bone disease -  Calcium  in goal. Phos 4.9.  Continue VDRA, sensipar  and binders.  Dispo - for SNF placement.   Belvie Och, NP [facility] 11/05/2023,11:44 AM

## 2023-11-06 DIAGNOSIS — N186 End stage renal disease: Secondary | ICD-10-CM | POA: Diagnosis not present

## 2023-11-06 DIAGNOSIS — I480 Paroxysmal atrial fibrillation: Secondary | ICD-10-CM | POA: Diagnosis not present

## 2023-11-06 DIAGNOSIS — Z89511 Acquired absence of right leg below knee: Secondary | ICD-10-CM | POA: Diagnosis not present

## 2023-11-06 DIAGNOSIS — Z992 Dependence on renal dialysis: Secondary | ICD-10-CM

## 2023-11-06 DIAGNOSIS — I70201 Unspecified atherosclerosis of native arteries of extremities, right leg: Secondary | ICD-10-CM | POA: Diagnosis not present

## 2023-11-06 LAB — CBC
HCT: 28.1 % — ABNORMAL LOW (ref 39.0–52.0)
Hemoglobin: 8.9 g/dL — ABNORMAL LOW (ref 13.0–17.0)
MCH: 27.7 pg (ref 26.0–34.0)
MCHC: 31.7 g/dL (ref 30.0–36.0)
MCV: 87.5 fL (ref 80.0–100.0)
Platelets: 225 K/uL (ref 150–400)
RBC: 3.21 MIL/uL — ABNORMAL LOW (ref 4.22–5.81)
RDW: 17.2 % — ABNORMAL HIGH (ref 11.5–15.5)
WBC: 9.1 K/uL (ref 4.0–10.5)
nRBC: 0 % (ref 0.0–0.2)

## 2023-11-06 LAB — RENAL FUNCTION PANEL
Albumin: 1.9 g/dL — ABNORMAL LOW (ref 3.5–5.0)
Anion gap: 14 (ref 5–15)
BUN: 45 mg/dL — ABNORMAL HIGH (ref 8–23)
CO2: 24 mmol/L (ref 22–32)
Calcium: 9.7 mg/dL (ref 8.9–10.3)
Chloride: 93 mmol/L — ABNORMAL LOW (ref 98–111)
Creatinine, Ser: 8.69 mg/dL — ABNORMAL HIGH (ref 0.61–1.24)
GFR, Estimated: 6 mL/min — ABNORMAL LOW (ref >60)
Glucose, Bld: 79 mg/dL (ref 70–99)
Phosphorus: 4.7 mg/dL — ABNORMAL HIGH (ref 2.5–4.6)
Potassium: 4.5 mmol/L (ref 3.5–5.1)
Sodium: 131 mmol/L — ABNORMAL LOW (ref 135–145)

## 2023-11-06 MED ORDER — OXYCODONE HCL 5 MG PO TABS
ORAL_TABLET | ORAL | Status: AC
Start: 2023-11-06 — End: 2023-11-06
  Filled 2023-11-06: qty 2

## 2023-11-06 NOTE — TOC Progression Note (Signed)
 Transition of Care [facility]) - Progression Note    Patient Details  Name: Jacob Daniel MRN: [id number] Date of Birth: May 20, 1955  [facility] CM/SW Contact  Isaiah Public, LCSWA Phone Number: 11/06/2023, 2:23 PM  Clinical Narrative:     CSW following to start insurance authorization for SNF once HD clinic is confirmed. CSW will continue to follow.   Expected Discharge Plan: Skilled Nursing Facility Barriers to Discharge: Insurance Authorization, SNF Pending bed offer (HD Clinic)               Expected Discharge Plan and Services                                               Social Drivers of Health (SDOH) Interventions SDOH Screenings   Food Insecurity: No Food Insecurity (10/25/2023)  Housing: Low Risk  (10/25/2023)  Transportation Needs: No Transportation Needs (10/25/2023)  Utilities: Not At Risk (10/25/2023)  Financial Resource Strain: Low Risk  (11/05/2022)   Received from [facility]  Social Connections: Moderately Integrated (10/25/2023)  Tobacco Use: Low Risk  (10/29/2023)    Readmission Risk Interventions    05/05/2023   12:37 PM  Readmission Risk Prevention Plan  Transportation Screening Complete  Medication Review (RN Care Manager) Referral to Pharmacy  HRI or Home Care Consult Complete  SW Recovery Care/Counseling Consult Complete  Palliative Care Screening Not Applicable  Skilled Nursing Facility Not Applicable

## 2023-11-06 NOTE — Plan of Care (Signed)
  Problem: Education: Goal: Knowledge of General Education information will improve Description: Including pain rating scale, medication(s)/side effects and non-pharmacologic comfort measures Outcome: Progressing   Problem: Health Behavior/Discharge Planning: Goal: Ability to manage health-related needs will improve Outcome: Progressing   Problem: Clinical Measurements: Goal: Ability to maintain clinical measurements within normal limits will improve Outcome: Progressing Goal: Will remain free from infection Outcome: Progressing Goal: Diagnostic test results will improve Outcome: Progressing Goal: Respiratory complications will improve Outcome: Progressing Goal: Cardiovascular complication will be avoided Outcome: Progressing   Problem: Activity: Goal: Risk for activity intolerance will decrease Outcome: Progressing   Problem: Nutrition: Goal: Adequate nutrition will be maintained Outcome: Progressing   Problem: Coping: Goal: Level of anxiety will decrease Outcome: Progressing   Problem: Elimination: Goal: Will not experience complications related to bowel motility Outcome: Progressing Goal: Will not experience complications related to urinary retention Outcome: Progressing   Problem: Pain Managment: Goal: General experience of comfort will improve and/or be controlled Outcome: Progressing   Problem: Safety: Goal: Ability to remain free from injury will improve Outcome: Progressing   Problem: Skin Integrity: Goal: Risk for impaired skin integrity will decrease Outcome: Progressing   Problem: Education: Goal: Ability to demonstrate management of disease process will improve Outcome: Progressing Goal: Ability to verbalize understanding of medication therapies will improve Outcome: Progressing   Problem: Activity: Goal: Capacity to carry out activities will improve Outcome: Progressing   Problem: Cardiac: Goal: Ability to achieve and maintain adequate  cardiopulmonary perfusion will improve Outcome: Progressing   Problem: Education: Goal: Knowledge of disease and its progression will improve Outcome: Progressing   Problem: Fluid Volume: Goal: Compliance with measures to maintain balanced fluid volume will improve Outcome: Progressing   Problem: Health Behavior/Discharge Planning: Goal: Ability to manage health-related needs will improve Outcome: Progressing   Problem: Nutritional: Goal: Ability to make healthy dietary choices will improve Outcome: Progressing   Problem: Clinical Measurements: Goal: Complications related to the disease process, condition or treatment will be avoided or minimized Outcome: Progressing   Problem: Education: Goal: Understanding of CV disease, CV risk reduction, and recovery process will improve Outcome: Progressing Goal: Individualized Educational Video(s) Outcome: Progressing   Problem: Activity: Goal: Ability to return to baseline activity level will improve Outcome: Progressing   Problem: Cardiovascular: Goal: Ability to achieve and maintain adequate cardiovascular perfusion will improve Outcome: Progressing Goal: Vascular access site(s) Level 0-1 will be maintained Outcome: Progressing   Problem: Health Behavior/Discharge Planning: Goal: Ability to safely manage health-related needs after discharge will improve Outcome: Progressing

## 2023-11-06 NOTE — Progress Notes (Signed)
  [facility] Progress Note   Subjective:  Seen in K[facility]. UF goal 4L. Feeling ok, just tired.   Objective Vitals:   11/06/23 0414 11/06/23 0718 11/06/23 0759 11/06/23 0813  BP: (!) 148/88  (!) 144/85 (!) 149/86  Pulse: (!) 51  (!) 54 (!) 52  Resp: 16  18 20   Temp: 98 F (36.7 C)  98.5 F (36.9 C)   TempSrc: Oral     SpO2: 99%  95% 98%  Weight:  74.1 kg 75.1 kg   Height:       Physical Exam General:  alert, lying bed, nad  Heart: RRR  Lungs: Clear bilaterally  Abdomen: non tender  Extremities:no LE edema, R BKA Dialysis Access: LU AVF   Filed Weights   11/04/23 1714 11/06/23 0718 11/06/23 0759  Weight: 76.5 kg 74.1 kg 75.1 kg    Intake/Output Summary (Last 24 hours) at 11/06/2023 0859 Last data filed at 11/05/2023 2200 Gross per 24 hour  Intake 100 ml  Output --  Net 100 ml    Additional Objective Labs: Basic Metabolic Panel: Recent Labs  Lab 10/31/23 0342 11/01/23 0458  NA 134* 134*  K 4.3 4.2  CL 96* 95*  CO2 22 23  GLUCOSE 101* 84  BUN 28* 40*  CREATININE 7.68* 9.07*  CALCIUM  9.0 9.3  PHOS 4.9*  --     CBC: Recent Labs  Lab 10/31/23 0342 11/01/23 0458 11/06/23 0811  WBC 12.6* 10.1 9.1  NEUTROABS 10.6* 7.8*  --   HGB 10.3* 9.4* 8.9*  HCT 31.6* 28.8* 28.1*  MCV 87.1 87.3 87.5  PLT 117* 135* 225    Medications:    apixaban   5 mg Oral BID   aspirin  EC  81 mg Oral Daily   atorvastatin   10 mg Oral Daily   calcitRIOL   1.75 mcg Oral Q M,W,F-1800   calcium  acetate  2,001 mg Oral TID WC   Chlorhexidine  Gluconate Cloth  6 each Topical Q0600   Chlorhexidine  Gluconate Cloth  6 each Topical Q0600   cinacalcet   30 mg Oral Q M,W,F-1800   ferrous sulfate   325 mg Oral Q breakfast   gabapentin   100 mg Oral QHS   irbesartan   300 mg Oral Daily   metoprolol  succinate  75 mg Oral Daily   sodium chloride  flush  3 mL Intravenous Q12H    Dialysis Orders: [facility]  on MWF . EDW 84kg HD Bath 2K/2.5Ca  Time 4:00 Heparin  1000 bolus  then 600 units/hr. 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  VVS following.   Left lower extremity cellulitis - s/p IV antibiotics.  ESRD -  HD on MWF. HD off schedule this week. HD Sat then resume regular schedule Monday.  Hypertension/volume  - BP better with UF.  Under EDW now. Will need lower EDW on d/c post BKA.  UF as tolerated. Anemia  - Hgb 8-9.  Received Aranesp  100 on 8/19.  Metabolic bone disease - Calcium /Phos acceptable.  Continue VDRA, sensipar  and binders.  Afib - on Eliquis   Dispo - for SNF placement.   Maisie Ronnald Acosta PA-C [facility] 11/06/2023,8:59 AM

## 2023-11-06 NOTE — Progress Notes (Signed)
 Received patient in bed to unit.  Alert and oriented.  Informed consent signed and in chart.   TX duration:3 hours and 15 minutes  Patient tolerated well.  Transported back to the room  Alert, without acute distress.  Hand-off given to patient's nurse.   Access used: Left AV Fistula Upper Arm Access issues: none  Total UF removed: 4L Medication(s) given: oxycodone     11/06/23 1137  Vitals  Temp 98 F (36.7 C)  Temp Source Oral  BP 135/74  MAP (mmHg) 93  Pulse Rate (!) 52  ECG Heart Rate (!) 51  Resp 11  Oxygen Therapy  SpO2 100 %  O2 Device Room Air  During Treatment Monitoring  Duration of HD Treatment -hour(s) 3.25 hour(s)  HD Safety Checks Performed Yes  Intra-Hemodialysis Comments Tx completed  Post Treatment  Dialyzer Clearance Clear  Liters Processed 78  Fluid Removed (mL) 4000 mL  Tolerated HD Treatment Yes  Post-Hemodialysis Comments tolerated well  AVG/AVF Arterial Site Held (minutes) 7 minutes  AVG/AVF Venous Site Held (minutes) 7 minutes  Fistula / Graft Left Upper arm  Placement Date/Time: 04/07/23 1900   Orientation: Left  Access Location: Upper arm  Status Deaccessed     [name] LPN [facility]

## 2023-11-06 NOTE — Hospital Course (Signed)
 67M Hx PVD, ESRD on HD (MWF), p/w right foot pain from spider on the left calf 7 days ago treated with doxycycline .  Subsequently found to have critical limb ischemia treated with heparin  and antibiotics.  Attempted recanalization of right posterior tibial artery but was unsuccessful.  Had no other options.  Underwent right-sided BKA. Waiting for skilled nursing facility.   Assessment and Plan:   Critical limb ischemia/severe PVD/RLE - Initially treated with empiric antibiotics.  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  Wound care per vascular surgery.  Completed antibiotics.   LLE cellulitis/nonhealing wound - Believed to be spider bite.  Completed 7 days of IV antibiotics.  Conservative management.   ESRD-HD MWF - Initially off schedule.  Now back on schedule as of Monday.    Hypertension - Irbesartan  and metoprolol  on board.   Paroxysmal A-fib - Eliquis  on board.   Anemia of chronic kidney disease - No active bleeding appreciated.  Recheck CBC in AM.   Physical debilitation muscle weakness - PT/OT on board.  Patient appears to be intermittently compliant with physical therapy.  Encouraged him to work with PT as it may limit his disposition to SNF.   Goals of care - Currently medically stable, awaiting transition to STR.  Pending outpatient dialysis being set up.  Initial clinic had been declined.  TOC working closely with nephrology on disposition planning.

## 2023-11-06 NOTE — Progress Notes (Signed)
 Progress Note   Patient: Jacob Daniel FMW:[id number] DOB: [birth year] DOA: 10/25/2023  DOS: the patient was seen and examined on 11/06/2023   Brief hospital course:  39M Hx PVD, ESRD on HD (MWF), p/w right foot pain from spider on the left calf 7 days ago treated with doxycycline .  Subsequently found to have critical limb ischemia treated with heparin  and antibiotics.  Attempted recanalization of right posterior tibial artery but was unsuccessful.  Had no other options.  Underwent right-sided BKA. Waiting for skilled nursing facility.  Assessment and Plan:  Critical limb ischemia/severe PVD/RLE - Initially treated with empiric antibiotics.  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  Wound care per vascular surgery.  Completed antibiotics.  LLE cellulitis/nonhealing wound - Believed to be spider bite.  Completed 7 days of IV antibiotics.  Conservative management.  ESRD-HD MWF - Currently off schedule, dialysis today per nephrology.  Nephrology following closely.  Plan to restart back on schedule Monday.  Hypertension - Irbesartan  and metoprolol  on board.  Paroxysmal A-fib - Eliquis  on board.  Anemia of chronic kidney disease - No active bleeding appreciated.  Recheck CBC in AM.  Goals of care - Currently medically stable, awaiting transition to STR.  Pending authorization.  Subjective: Patient visited while on hemodialysis.  Resting comfortably, denies any fever, chills, shortness of breath, chest pain, nausea, vomiting, abdominal pain.  Still has some lower extremity pain intermittently.  Physical Exam:  Vitals:   11/06/23 1144 11/06/23 1200 11/06/23 1203 11/06/23 1224  BP: 130/85   128/75  Pulse:  (!) 54  (!) 57  Resp:  20  18  Temp:    98.2 F (36.8 C)  TempSrc:    Oral  SpO2:  94%  98%  Weight:   71.1 kg   Height:        GENERAL:  Alert, pleasant, no acute distress  HEENT:  EOMI CARDIOVASCULAR:  RRR, no murmurs appreciated RESPIRATORY:  Clear to auscultation,  no wheezing, rales, or rhonchi GASTROINTESTINAL:  Soft, nontender, nondistended EXTREMITIES: Right BKA NEURO:  No new focal deficits appreciated SKIN:  No rashes noted PSYCH:  Appropriate mood and affect     Data Reviewed:  Imaging Studies: PERIPHERAL VASCULAR CATHETERIZATION Result Date: 10/27/2023 Images from the original result were not included. Patient name: Jacob Daniel MRN: [id number] DOB: 03-27-1955 Sex: male 10/25/2023 - 10/27/2023 Pre-operative Diagnosis: Right lower extremity rest pain Post-operative diagnosis:  Same Surgeon:  Fonda FORBES Rim, MD Procedure Performed: 1.  Ultrasound-guided micropuncture access of the left common femoral artery in retrograde fashion 2.  Aortogram 3.  Second order cannulation, left lower extremity angiogram 4.  Selective angiography of the superficial femoral artery 5.  Selective angiography of the posterior tibial artery 6.  Attempted recanalization of the posterior tibial artery-unsuccessful 7.  Left lower extremity angiogram 8.  Arteriotomy manage with next device Contrast volume 140 cc, sedation time 72 minutes Indications: Patient is a 68 year old male with peripheral arterial disease and right sided rest pain.  He presented to the hospital due to worsening pain in the right toes.  He has a wound on the left calf from a spider bite.  I have discussed the risks and benefits of right lower extremity angiography in an effort to define and improve distal perfusion to alleviate rest pain, Ahyan elected to proceed. Findings: Sluggish flow throughout indicative of heart failure-known ejection fraction 30 to 35% 6 months ago Aorta: Severe atherosclerotic disease throughout.  No flow-limiting stenosis identified Right leg: Severe atherosclerotic  disease throughout.  Widely patent common femoral artery, profunda, superficial femoral artery, popliteal artery.  Distally there is single-vessel peroneal artery outflow filling the foot through collaterals.  The posterior  tibial artery has a high takeoff, but occludes.  Severe small vessel disease. Left leg: Severe atherosclerotic disease throughout.  Widely patent common femoral artery, profunda, superficial femoral artery, popliteal artery.  There appears to be three-vessels opacifying in the calf, however distally contrast washout due to heart failure, and distal tibial vessels and pedal vessels were not visualized.  Procedure:  The patient was identified in the holding area and taken to room 8.  The patient was then placed supine on the table and prepped and draped in the usual sterile fashion.  A time out was called.  Moderate sedation was administered using Fentanyl  and Versed .  Ultrasound was used to evaluate the left common femoral artery.  It was patent.  A digital ultrasound image was acquired.  A micropuncture needle was used to access the left common femoral artery under ultrasound guidance.  An 018 wire was advanced without resistance and a micropuncture sheath was placed.  The 018 wire was removed and a benson wire was placed.  The micropuncture sheath was exchanged for a 5 french sheath.  An omniflush catheter was advanced over the wire to the level of L-1.  An abdominal angiogram was obtained.  Next, using the omniflush catheter and a benson wire, the aortic bifurcation was crossed and the catheter was placed into theright external iliac artery and right runoff was obtained.  left runoff was performed via retrograde sheath injections. See results above. I elected to attempt intervention on the posterior tibial artery as it appeared to fill in very small islands from collaterals.  A 6 x 60 cm sheath was brought into the field and parked in the right superficial femoral artery.  The patient was heparinized.  A series of wires and catheters were used to navigate into the high takeoff of the posterior tibial artery.  A series of wires and catheters were used including 0.014 wires, however I was unable to cross the  occlusion.  I elected not to attempt retrograde intervention as outflow was poor.  I closed the percutaneous access site with a Mynx device.  The patient became very aggravated and angry causing the Mynx device to fail and manual pressure was held.  The patient was administered another 1 mg of Versed  to allow for safe arteriotomy management. Impression: Unsuccessful attempt at recanalization of the right posterior tibial artery.  Severe microvascular disease.  Patient is maximally revascularized with no further options.  Pain is multifactorial from sluggish flow due to heart failure and severe peripheral arterial, and microvascular disease. No wounds or rest pain in the right leg.  Patient would benefit from orthopedic surgery evaluation, likely debridement.  Should wound healing be a problem, would happily perform diagnostic angiography with possible intervention on the left leg. Fonda FORBES Rim MD [facility] Office: [phone]   VAS US  ABI WITH/WO TBI Result Date: 10/26/2023  LOWER EXTREMITY DOPPLER STUDY Patient Name:  Geraldo Haris  Date of Exam:   10/26/2023 Medical Rec #: [id number]         Accession #:    [id number] Date of Birth: Jun 11, 1955         Patient Gender: M Patient Age:   54 years Exam Location:  [facility] Procedure:      VAS US  ABI WITH/WO TBI Referring Phys:  JOSHUA ROBINS --------------------------------------------------------------------------------  Indications: critical limb ischemia High Risk Factors: Hypertension, hyperlipidemia, Diabetes, no history of tobacco                    use, marijuana use, coronary artery disease. Other Factors: CHF, Afib, ESRD (HD).  Comparison Study: No previous exams Performing Technologist: Hill, Jody RVT, RDMS  Examination Guidelines: A complete evaluation includes at minimum, Doppler waveform signals and systolic blood pressure reading at the level of bilateral brachial, anterior tibial, and posterior tibial  arteries, when vessel segments are accessible. Bilateral testing is considered an integral part of a complete examination. Photoelectric Plethysmograph (PPG) waveforms and toe systolic pressure readings are included as required and additional duplex testing as needed. Limited examinations for reoccurring indications may be performed as noted.  ABI Findings: +---------+------------------+-----+-------------------+-------------+ Right    Rt Pressure (mmHg)IndexWaveform           Comment       +---------+------------------+-----+-------------------+-------------+ Brachial 168                    multiphasic        Hx of RUE AVF +---------+------------------+-----+-------------------+-------------+ PTA      109               0.65 dampened monophasic              +---------+------------------+-----+-------------------+-------------+ DP       138               0.82 monophasic                       +---------+------------------+-----+-------------------+-------------+ Great Toe0                 0.00 Absent                           +---------+------------------+-----+-------------------+-------------+ +---------+------------------+-----+-------------------+---------+ Left     Lt Pressure (mmHg)IndexWaveform           Comment   +---------+------------------+-----+-------------------+---------+ Brachial                                           HD access +---------+------------------+-----+-------------------+---------+ PTA      254               1.51 dampened monophasic          +---------+------------------+-----+-------------------+---------+ DP       254               1.51 dampened monophasic          +---------+------------------+-----+-------------------+---------+ Great Toe0                 0.00 Absent                       +---------+------------------+-----+-------------------+---------+  Arterial wall calcification precludes accurate ankle pressures and  ABIs.  Summary: Right: Resting right ankle-brachial index indicates mild right lower extremity arterial disease, however this may be falsely elevated due to arterial wall calcification. The right toe-brachial index is absent. Left: Resting left ankle-brachial index indicates noncompressible left lower extremity arteries. The left toe-brachial index is absent. *See table(s) above for measurements and observations.  Electronically signed by Penne Colorado MD on 10/26/2023 at 10:07:25 PM.    Final    CT Angio Aortobifemoral  W and/or Wo Contrast Result Date: 10/25/2023 CLINICAL DATA:  Claudication or leg ischemia, right foot pain/swelling for 3 days EXAM: CT ANGIOGRAPHY CHEST WITH CONTRAST TECHNIQUE: Multidetector CT imaging of the abdomen and pelvis with run-off to both lower extremities was performed using the standard protocol during bolus administration of intravenous contrast. Multiplanar CT image reconstructions and MIPs were obtained to evaluate the vascular anatomy. RADIATION DOSE REDUCTION: This exam was performed according to the departmental dose-optimization program which includes automated exposure control, adjustment of the mA and/or kV according to patient size and/or use of iterative reconstruction technique. CONTRAST:  OMNIPAQUE  IOHEXOL  350 MG/ML SOLN COMPARISON:  May 04, 2023 FINDINGS: VASCULAR Aorta: No aneurysm or aortic dissection. No acute thrombus. Diffuse calcified atherosclerosis throughout. Celiac: Patent without aneurysm or dissection.No hemodynamically significant stenosis. SMA: Patent without aneurysm or dissection.No hemodynamically significant stenosis. Renals: Patent without aneurysm or dissection.Diffusely calcified with moderate stenoses of both renal ostia from calcified plaques. IMA: Patent without aneurysm or dissection.Moderate to severe stenosis of the ostium due to calcified plaque. RIGHT Lower Extremity Inflow: Common, internal and external iliac arteries are patent  without aneurysm, acute thrombus, or dissection. Diffuse calcified atherosclerosis throughout without hemodynamically significant stenosis. Outflow: The common and proximal superficial femoral arteries are patent with diffuse calcified plaque throughout. The profunda femoral artery is also patent with diffuse calcified atherosclerosis throughout. Decreased opacification of the mid and distal superficial femoral artery with thrombosis of the popliteal artery starting in the above knee segment. Runoff: Extensive calcified atherosclerosis throughout the trifurcation vessels, which are not opacified. LEFT Lower Extremity Inflow: Common, internal and external iliac arteries are patent without aneurysm, acute thrombus, or dissection. Diffuse calcified atherosclerosis throughout without hemodynamically significant stenosis. Outflow: Common, superficial, and profunda femoral are patent without acute thrombus, aneurysm, or dissection. Diffuse calcified atherosclerosis throughout the common and superficial femoral arteries without hemodynamically significant stenosis. The popliteal artery is also diffusely calcified. There is delayed perfusion of the above and behind knee popliteal artery. Runoff: Extensive calcified atherosclerosis throughout the trifurcation vessels, which are not opacified. Veins: Not evaluated due to the phase of contrast. Review of the MIP images confirms the above findings. NON-VASCULAR Lower chest: Mild diffuse prominence of the pulmonary interstitium with intralobular septal thickening.Bibasilar atelectasis. Mild cardiomegaly with densely calcified coronary arteries. Hepatobiliary: No mass.Multiple small radiopaque gallstones. No wall thickening.No intrahepatic or extrahepatic biliary ductal dilation. Pancreas: No mass or main ductal dilation.No peripancreatic inflammation or fluid collection. Spleen: Normal size. No mass. Adrenals/Urinary Tract: No adrenal masses. The renal cortices of both kidneys  are full of cysts. No hydronephrosis or nephrolithiasis. The urinary bladder is completely decompressed. 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 Stomach/Bowel: The stomach is decompressed without focal abnormality. No small bowel wall thickening or inflammation. No small bowel obstruction. Scattered total colonic diverticulosis. Vascular/Lymphatic: No intraabdominal or pelvic lymphadenopathy. Reproductive: No prostatomegaly.No free pelvic fluid. Other: Moderate volume ascites.  No pneumoperitoneum. Musculoskeletal: No acute fracture or destructive lesion.Subcutaneous edema in both calves. Bone changes consistent with renal osteodystrophy. Multilevel degenerative disc disease of the spine. IMPRESSION: VASCULAR No abdominal aortic aneurysm or aortic dissection. Extensive aortic atherosclerosis without hemodynamically significant stenosis. Severe peripheral vascular atherosclerosis also noted in both legs. Aortic Atherosclerosis (ICD10-I70.0). Right leg: 1. Diffuse calcified atherosclerosis throughout the inflow and proximal outflow vessels. Decreased opacification of the mid and distal superficial femoral artery with thrombosis of the popliteal artery starting in the above knee segment. 2. Non opacification of the trifurcation vessels below the knee, which may be due  to non opacification or thrombosis. Left leg: 1. No acute thrombus or hemodynamically significant stenosis within the inflow or proximal outflow vessels. 2. Delayed contrast opacification of the above and behind knee popliteal artery without significant opacification of the trifurcation vessels below the knee, possibly due to slow flow. NON-VASCULAR 1. Mild prominence of the pulmonary interstitium in both lung bases with minimal intralobular septal thickening, likely representing early changes of edema. 2. Total colonic diverticulosis. No changes of acute diverticulitis. 3. Few small calcified gallstones present. 4. Moderate volume ascites.  Critical Value/emergent results were called by telephone at the time of interpretation on 10/25/2023 at 3:28 pm to provider BERNARDINO FIREMAN , who verbally acknowledged these results. Electronically Signed   By: Rogelia Myers M.D.   On: 10/25/2023 15:37   DG Foot 2 Views Right Result Date: 10/25/2023 CLINICAL DATA:  Foot pain, second digit EXAM: RIGHT FOOT - 2 VIEW COMPARISON:  None Available. FINDINGS: There is no evidence of fracture or dislocation. Sclerotic lesion in distal end of the second metatarsal bone likely bone island, similar to prior exam from 2021. There is no evidence of arthropathy or other focal bone abnormality. Small calcaneal spur. Diffuse vascular calcifications. Soft tissues are otherwise unremarkable. IMPRESSION: No acute findings. Electronically Signed   By: Megan  Zare M.D.   On: 10/25/2023 12:05    There are no new results to review at this time.  Previous records (including but not limited to H&P, progress notes, nursing notes, TOC management) were reviewed in assessment of this patient.  Labs: CBC: Recent Labs  Lab 10/31/23 0342 11/01/23 0458 11/06/23 0811  WBC 12.6* 10.1 9.1  NEUTROABS 10.6* 7.8*  --   HGB 10.3* 9.4* 8.9*  HCT 31.6* 28.8* 28.1*  MCV 87.1 87.3 87.5  PLT 117* 135* 225   Basic Metabolic Panel: Recent Labs  Lab 10/31/23 0342 11/01/23 0458 11/06/23 0811  NA 134* 134* 131*  K 4.3 4.2 4.5  CL 96* 95* 93*  CO2 22 23 24   GLUCOSE 101* 84 79  BUN 28* 40* 45*  CREATININE 7.68* 9.07* 8.69*  CALCIUM  9.0 9.3 9.7  PHOS 4.9*  --  4.7*   Liver Function Tests: Recent Labs  Lab 11/06/23 0811  ALBUMIN  1.9*   CBG: No results for input(s): GLUCAP in the last 168 hours.  Scheduled Meds:  apixaban   5 mg Oral BID   aspirin  EC  81 mg Oral Daily   atorvastatin   10 mg Oral Daily   calcitRIOL   1.75 mcg Oral Q M,W,F-1800   calcium  acetate  2,001 mg Oral TID WC   Chlorhexidine  Gluconate Cloth  6 each Topical Q0600   Chlorhexidine  Gluconate Cloth  6  each Topical Q0600   cinacalcet   30 mg Oral Q M,W,F-1800   ferrous sulfate   325 mg Oral Q breakfast   gabapentin   100 mg Oral QHS   irbesartan   300 mg Oral Daily   metoprolol  succinate  75 mg Oral Daily   sodium chloride  flush  3 mL Intravenous Q12H   Continuous Infusions: PRN Meds:.acetaminophen  **OR** acetaminophen , acetaminophen , albuterol , bisacodyl , hydrALAZINE , ipratropium-albuterol , labetalol , ondansetron  **OR** ondansetron  (ZOFRAN ) IV, mouth rinse, oxyCODONE , sodium chloride  flush  Family Communication: None at bedside  Disposition: Status is: Inpatient Remains inpatient appropriate because: ESRD     Time spent: 40 minutes  Length of inpatient stay: 12 days  Author: Carliss LELON Canales, DO 11/06/2023 12:49 PM  For on call review [email or web address].

## 2023-11-07 LAB — MAGNESIUM: Magnesium: 2.1 mg/dL (ref 1.7–2.4)

## 2023-11-07 LAB — BASIC METABOLIC PANEL WITH GFR
Anion gap: 10 (ref 5–15)
BUN: 31 mg/dL — ABNORMAL HIGH (ref 8–23)
CO2: 28 mmol/L (ref 22–32)
Calcium: 10 mg/dL (ref 8.9–10.3)
Chloride: 95 mmol/L — ABNORMAL LOW (ref 98–111)
Creatinine, Ser: 6.8 mg/dL — ABNORMAL HIGH (ref 0.61–1.24)
GFR, Estimated: 8 mL/min — ABNORMAL LOW (ref >60)
Glucose, Bld: 87 mg/dL (ref 70–99)
Potassium: 4.1 mmol/L (ref 3.5–5.1)
Sodium: 133 mmol/L — ABNORMAL LOW (ref 135–145)

## 2023-11-07 LAB — CBC
HCT: 34 % — ABNORMAL LOW (ref 39.0–52.0)
Hemoglobin: 10.7 g/dL — ABNORMAL LOW (ref 13.0–17.0)
MCH: 27.7 pg (ref 26.0–34.0)
MCHC: 31.5 g/dL (ref 30.0–36.0)
MCV: 88.1 fL (ref 80.0–100.0)
Platelets: 271 K/uL (ref 150–400)
RBC: 3.86 MIL/uL — ABNORMAL LOW (ref 4.22–5.81)
RDW: 17.4 % — ABNORMAL HIGH (ref 11.5–15.5)
WBC: 9.4 K/uL (ref 4.0–10.5)
nRBC: 0 % (ref 0.0–0.2)

## 2023-11-07 NOTE — Plan of Care (Signed)
  Problem: Education: Goal: Knowledge of General Education information will improve Description: Including pain rating scale, medication(s)/side effects and non-pharmacologic comfort measures Outcome: Progressing   Problem: Health Behavior/Discharge Planning: Goal: Ability to manage health-related needs will improve Outcome: Progressing   Problem: Clinical Measurements: Goal: Ability to maintain clinical measurements within normal limits will improve Outcome: Progressing Goal: Will remain free from infection Outcome: Progressing Goal: Diagnostic test results will improve Outcome: Progressing Goal: Respiratory complications will improve Outcome: Progressing Goal: Cardiovascular complication will be avoided Outcome: Progressing   Problem: Activity: Goal: Risk for activity intolerance will decrease Outcome: Progressing   Problem: Nutrition: Goal: Adequate nutrition will be maintained Outcome: Progressing   Problem: Coping: Goal: Level of anxiety will decrease Outcome: Progressing   Problem: Elimination: Goal: Will not experience complications related to bowel motility Outcome: Progressing Goal: Will not experience complications related to urinary retention Outcome: Progressing   Problem: Pain Managment: Goal: General experience of comfort will improve and/or be controlled Outcome: Progressing   Problem: Safety: Goal: Ability to remain free from injury will improve Outcome: Progressing   Problem: Skin Integrity: Goal: Risk for impaired skin integrity will decrease Outcome: Progressing   Problem: Education: Goal: Ability to demonstrate management of disease process will improve Outcome: Progressing Goal: Ability to verbalize understanding of medication therapies will improve Outcome: Progressing   Problem: Activity: Goal: Capacity to carry out activities will improve Outcome: Progressing   Problem: Cardiac: Goal: Ability to achieve and maintain adequate  cardiopulmonary perfusion will improve Outcome: Progressing   Problem: Education: Goal: Knowledge of disease and its progression will improve Outcome: Progressing   Problem: Fluid Volume: Goal: Compliance with measures to maintain balanced fluid volume will improve Outcome: Progressing   Problem: Health Behavior/Discharge Planning: Goal: Ability to manage health-related needs will improve Outcome: Progressing   Problem: Nutritional: Goal: Ability to make healthy dietary choices will improve Outcome: Progressing   Problem: Clinical Measurements: Goal: Complications related to the disease process, condition or treatment will be avoided or minimized Outcome: Progressing   Problem: Education: Goal: Understanding of CV disease, CV risk reduction, and recovery process will improve Outcome: Progressing Goal: Individualized Educational Video(s) Outcome: Progressing   Problem: Activity: Goal: Ability to return to baseline activity level will improve Outcome: Progressing   Problem: Cardiovascular: Goal: Ability to achieve and maintain adequate cardiovascular perfusion will improve Outcome: Progressing Goal: Vascular access site(s) Level 0-1 will be maintained Outcome: Progressing   Problem: Health Behavior/Discharge Planning: Goal: Ability to safely manage health-related needs after discharge will improve Outcome: Progressing

## 2023-11-07 NOTE — Progress Notes (Signed)
 Progress Note   Patient: Jacob Daniel FMW:[id number] DOB: 1956-02-22 DOA: 10/25/2023  DOS: the patient was seen and examined on 11/07/2023   Brief hospital course:  39M Hx PVD, ESRD on HD (MWF), p/w right foot pain from spider on the left calf 7 days ago treated with doxycycline .  Subsequently found to have critical limb ischemia treated with heparin  and antibiotics.  Attempted recanalization of right posterior tibial artery but was unsuccessful.  Had no other options.  Underwent right-sided BKA. Waiting for skilled nursing facility.  Assessment and Plan:  Critical limb ischemia/severe PVD/RLE - Initially treated with empiric antibiotics.  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  Wound care per vascular surgery.  Completed antibiotics.  LLE cellulitis/nonhealing wound - Believed to be spider bite.  Completed 7 days of IV antibiotics.  Conservative management.  ESRD-HD MWF - Currently off schedule, dialysis.  Nephrology following closely.  Plan to restart back on schedule Monday.  Hypertension - Irbesartan  and metoprolol  on board.  Paroxysmal A-fib - Eliquis  on board.  Anemia of chronic kidney disease - No active bleeding appreciated.  Recheck CBC in AM.  Goals of care - Currently medically stable, awaiting transition to STR.  Pending authorization.  Subjective: Resting comfortably, denies any fever, chills, shortness of breath, chest pain, nausea, vomiting, abdominal pain.  Still has some lower extremity pain intermittently.  Physical Exam:  Vitals:   11/06/23 2316 11/07/23 0333 11/07/23 0535 11/07/23 0804  BP: 138/79 (!) 151/86  (!) 142/80  Pulse: (!) 52 (!) 51  (!) 53  Resp: 20 20  18   Temp: 98.2 F (36.8 C) 98.2 F (36.8 C)  98.5 F (36.9 C)  TempSrc: Oral Oral  Oral  SpO2: 100% 100%  100%  Weight:   71.6 kg   Height:        GENERAL:  Alert, pleasant, no acute distress  HEENT:  EOMI CARDIOVASCULAR:  RRR, no murmurs appreciated RESPIRATORY:  Clear to  auscultation, no wheezing, rales, or rhonchi GASTROINTESTINAL:  Soft, nontender, nondistended EXTREMITIES: Right BKA NEURO:  No new focal deficits appreciated SKIN:  No rashes noted PSYCH:  Appropriate mood and affect     Data Reviewed:  Imaging Studies: PERIPHERAL VASCULAR CATHETERIZATION Result Date: 10/27/2023 Images from the original result were not included. Patient name: Jacob Daniel MRN: [id number] DOB: 1955-04-30 Sex: male 10/25/2023 - 10/27/2023 Pre-operative Diagnosis: Right lower extremity rest pain Post-operative diagnosis:  Same Surgeon:  Fonda FORBES Rim, MD Procedure Performed: 1.  Ultrasound-guided micropuncture access of the left common femoral artery in retrograde fashion 2.  Aortogram 3.  Second order cannulation, left lower extremity angiogram 4.  Selective angiography of the superficial femoral artery 5.  Selective angiography of the posterior tibial artery 6.  Attempted recanalization of the posterior tibial artery-unsuccessful 7.  Left lower extremity angiogram 8.  Arteriotomy manage with next device Contrast volume 140 cc, sedation time 72 minutes Indications: Patient is a 68 year old male with peripheral arterial disease and right sided rest pain.  He presented to the hospital due to worsening pain in the right toes.  He has a wound on the left calf from a spider bite.  I have discussed the risks and benefits of right lower extremity angiography in an effort to define and improve distal perfusion to alleviate rest pain, Rocky elected to proceed. Findings: Sluggish flow throughout indicative of heart failure-known ejection fraction 30 to 35% 6 months ago Aorta: Severe atherosclerotic disease throughout.  No flow-limiting stenosis identified Right leg: Severe atherosclerotic  disease throughout.  Widely patent common femoral artery, profunda, superficial femoral artery, popliteal artery.  Distally there is single-vessel peroneal artery outflow filling the foot through collaterals.   The posterior tibial artery has a high takeoff, but occludes.  Severe small vessel disease. Left leg: Severe atherosclerotic disease throughout.  Widely patent common femoral artery, profunda, superficial femoral artery, popliteal artery.  There appears to be three-vessels opacifying in the calf, however distally contrast washout due to heart failure, and distal tibial vessels and pedal vessels were not visualized.  Procedure:  The patient was identified in the holding area and taken to room 8.  The patient was then placed supine on the table and prepped and draped in the usual sterile fashion.  A time out was called.  Moderate sedation was administered using Fentanyl  and Versed .  Ultrasound was used to evaluate the left common femoral artery.  It was patent.  A digital ultrasound image was acquired.  A micropuncture needle was used to access the left common femoral artery under ultrasound guidance.  An 018 wire was advanced without resistance and a micropuncture sheath was placed.  The 018 wire was removed and a benson wire was placed.  The micropuncture sheath was exchanged for a 5 french sheath.  An omniflush catheter was advanced over the wire to the level of L-1.  An abdominal angiogram was obtained.  Next, using the omniflush catheter and a benson wire, the aortic bifurcation was crossed and the catheter was placed into theright external iliac artery and right runoff was obtained.  left runoff was performed via retrograde sheath injections. See results above. I elected to attempt intervention on the posterior tibial artery as it appeared to fill in very small islands from collaterals.  A 6 x 60 cm sheath was brought into the field and parked in the right superficial femoral artery.  The patient was heparinized.  A series of wires and catheters were used to navigate into the high takeoff of the posterior tibial artery.  A series of wires and catheters were used including 0.014 wires, however I was unable to  cross the occlusion.  I elected not to attempt retrograde intervention as outflow was poor.  I closed the percutaneous access site with a Mynx device.  The patient became very aggravated and angry causing the Mynx device to fail and manual pressure was held.  The patient was administered another 1 mg of Versed  to allow for safe arteriotomy management. Impression: Unsuccessful attempt at recanalization of the right posterior tibial artery.  Severe microvascular disease.  Patient is maximally revascularized with no further options.  Pain is multifactorial from sluggish flow due to heart failure and severe peripheral arterial, and microvascular disease. No wounds or rest pain in the right leg.  Patient would benefit from orthopedic surgery evaluation, likely debridement.  Should wound healing be a problem, would happily perform diagnostic angiography with possible intervention on the left leg. Fonda FORBES Rim MD [facility] Office: [phone]   VAS US  ABI WITH/WO TBI Result Date: 10/26/2023  LOWER EXTREMITY DOPPLER STUDY Patient Name:  Jacob Daniel  Date of Exam:   10/26/2023 Medical Rec #: [id number]         Accession #:    [id number] Date of Birth: [birth year]         Patient Gender: M Patient Age:   40 years Exam Location:  [facility] Procedure:      VAS US  ABI WITH/WO TBI Referring Phys:  JOSHUA ROBINS --------------------------------------------------------------------------------  Indications: critical limb ischemia High Risk Factors: Hypertension, hyperlipidemia, Diabetes, no history of tobacco                    use, marijuana use, coronary artery disease. Other Factors: CHF, Afib, ESRD (HD).  Comparison Study: No previous exams Performing Technologist: Hill, Jody RVT, RDMS  Examination Guidelines: A complete evaluation includes at minimum, Doppler waveform signals and systolic blood pressure reading at the level of bilateral brachial, anterior tibial, and posterior  tibial arteries, when vessel segments are accessible. Bilateral testing is considered an integral part of a complete examination. Photoelectric Plethysmograph (PPG) waveforms and toe systolic pressure readings are included as required and additional duplex testing as needed. Limited examinations for reoccurring indications may be performed as noted.  ABI Findings: +---------+------------------+-----+-------------------+-------------+ Right    Rt Pressure (mmHg)IndexWaveform           Comment       +---------+------------------+-----+-------------------+-------------+ Brachial 168                    multiphasic        Hx of RUE AVF +---------+------------------+-----+-------------------+-------------+ PTA      109               0.65 dampened monophasic              +---------+------------------+-----+-------------------+-------------+ DP       138               0.82 monophasic                       +---------+------------------+-----+-------------------+-------------+ Great Toe0                 0.00 Absent                           +---------+------------------+-----+-------------------+-------------+ +---------+------------------+-----+-------------------+---------+ Left     Lt Pressure (mmHg)IndexWaveform           Comment   +---------+------------------+-----+-------------------+---------+ Brachial                                           HD access +---------+------------------+-----+-------------------+---------+ PTA      254               1.51 dampened monophasic          +---------+------------------+-----+-------------------+---------+ DP       254               1.51 dampened monophasic          +---------+------------------+-----+-------------------+---------+ Great Toe0                 0.00 Absent                       +---------+------------------+-----+-------------------+---------+  Arterial wall calcification precludes accurate ankle pressures  and ABIs.  Summary: Right: Resting right ankle-brachial index indicates mild right lower extremity arterial disease, however this may be falsely elevated due to arterial wall calcification. The right toe-brachial index is absent. Left: Resting left ankle-brachial index indicates noncompressible left lower extremity arteries. The left toe-brachial index is absent. *See table(s) above for measurements and observations.  Electronically signed by Penne Colorado MD on 10/26/2023 at 10:07:25 PM.    Final    CT Angio Aortobifemoral  W and/or Wo Contrast Result Date: 10/25/2023 CLINICAL DATA:  Claudication or leg ischemia, right foot pain/swelling for 3 days EXAM: CT ANGIOGRAPHY CHEST WITH CONTRAST TECHNIQUE: Multidetector CT imaging of the abdomen and pelvis with run-off to both lower extremities was performed using the standard protocol during bolus administration of intravenous contrast. Multiplanar CT image reconstructions and MIPs were obtained to evaluate the vascular anatomy. RADIATION DOSE REDUCTION: This exam was performed according to the departmental dose-optimization program which includes automated exposure control, adjustment of the mA and/or kV according to patient size and/or use of iterative reconstruction technique. CONTRAST:  OMNIPAQUE  IOHEXOL  350 MG/ML SOLN COMPARISON:  May 04, 2023 FINDINGS: VASCULAR Aorta: No aneurysm or aortic dissection. No acute thrombus. Diffuse calcified atherosclerosis throughout. Celiac: Patent without aneurysm or dissection.No hemodynamically significant stenosis. SMA: Patent without aneurysm or dissection.No hemodynamically significant stenosis. Renals: Patent without aneurysm or dissection.Diffusely calcified with moderate stenoses of both renal ostia from calcified plaques. IMA: Patent without aneurysm or dissection.Moderate to severe stenosis of the ostium due to calcified plaque. RIGHT Lower Extremity Inflow: Common, internal and external iliac arteries are  patent without aneurysm, acute thrombus, or dissection. Diffuse calcified atherosclerosis throughout without hemodynamically significant stenosis. Outflow: The common and proximal superficial femoral arteries are patent with diffuse calcified plaque throughout. The profunda femoral artery is also patent with diffuse calcified atherosclerosis throughout. Decreased opacification of the mid and distal superficial femoral artery with thrombosis of the popliteal artery starting in the above knee segment. Runoff: Extensive calcified atherosclerosis throughout the trifurcation vessels, which are not opacified. LEFT Lower Extremity Inflow: Common, internal and external iliac arteries are patent without aneurysm, acute thrombus, or dissection. Diffuse calcified atherosclerosis throughout without hemodynamically significant stenosis. Outflow: Common, superficial, and profunda femoral are patent without acute thrombus, aneurysm, or dissection. Diffuse calcified atherosclerosis throughout the common and superficial femoral arteries without hemodynamically significant stenosis. The popliteal artery is also diffusely calcified. There is delayed perfusion of the above and behind knee popliteal artery. Runoff: Extensive calcified atherosclerosis throughout the trifurcation vessels, which are not opacified. Veins: Not evaluated due to the phase of contrast. Review of the MIP images confirms the above findings. NON-VASCULAR Lower chest: Mild diffuse prominence of the pulmonary interstitium with intralobular septal thickening.Bibasilar atelectasis. Mild cardiomegaly with densely calcified coronary arteries. Hepatobiliary: No mass.Multiple small radiopaque gallstones. No wall thickening.No intrahepatic or extrahepatic biliary ductal dilation. Pancreas: No mass or main ductal dilation.No peripancreatic inflammation or fluid collection. Spleen: Normal size. No mass. Adrenals/Urinary Tract: No adrenal masses. The renal cortices of both  kidneys are full of cysts. No hydronephrosis or nephrolithiasis. The urinary bladder is completely decompressed. 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 Stomach/Bowel: The stomach is decompressed without focal abnormality. No small bowel wall thickening or inflammation. No small bowel obstruction. Scattered total colonic diverticulosis. Vascular/Lymphatic: No intraabdominal or pelvic lymphadenopathy. Reproductive: No prostatomegaly.No free pelvic fluid. Other: Moderate volume ascites.  No pneumoperitoneum. Musculoskeletal: No acute fracture or destructive lesion.Subcutaneous edema in both calves. Bone changes consistent with renal osteodystrophy. Multilevel degenerative disc disease of the spine. IMPRESSION: VASCULAR No abdominal aortic aneurysm or aortic dissection. Extensive aortic atherosclerosis without hemodynamically significant stenosis. Severe peripheral vascular atherosclerosis also noted in both legs. Aortic Atherosclerosis (ICD10-I70.0). Right leg: 1. Diffuse calcified atherosclerosis throughout the inflow and proximal outflow vessels. Decreased opacification of the mid and distal superficial femoral artery with thrombosis of the popliteal artery starting in the above knee segment. 2. Non opacification of the trifurcation vessels below the knee, which may be due  to non opacification or thrombosis. Left leg: 1. No acute thrombus or hemodynamically significant stenosis within the inflow or proximal outflow vessels. 2. Delayed contrast opacification of the above and behind knee popliteal artery without significant opacification of the trifurcation vessels below the knee, possibly due to slow flow. NON-VASCULAR 1. Mild prominence of the pulmonary interstitium in both lung bases with minimal intralobular septal thickening, likely representing early changes of edema. 2. Total colonic diverticulosis. No changes of acute diverticulitis. 3. Few small calcified gallstones present. 4. Moderate volume  ascites. Critical Value/emergent results were called by telephone at the time of interpretation on 10/25/2023 at 3:28 pm to provider BERNARDINO FIREMAN , who verbally acknowledged these results. Electronically Signed   By: Rogelia Myers M.D.   On: 10/25/2023 15:37   DG Foot 2 Views Right Result Date: 10/25/2023 CLINICAL DATA:  Foot pain, second digit EXAM: RIGHT FOOT - 2 VIEW COMPARISON:  None Available. FINDINGS: There is no evidence of fracture or dislocation. Sclerotic lesion in distal end of the second metatarsal bone likely bone island, similar to prior exam from 2021. There is no evidence of arthropathy or other focal bone abnormality. Small calcaneal spur. Diffuse vascular calcifications. Soft tissues are otherwise unremarkable. IMPRESSION: No acute findings. Electronically Signed   By: Megan  Zare M.D.   On: 10/25/2023 12:05    There are no new results to review at this time.  Previous records (including but not limited to H&P, progress notes, nursing notes, TOC management) were reviewed in assessment of this patient.  Labs: CBC: Recent Labs  Lab 11/01/23 0458 11/06/23 0811 11/07/23 0348  WBC 10.1 9.1 9.4  NEUTROABS 7.8*  --   --   HGB 9.4* 8.9* 10.7*  HCT 28.8* 28.1* 34.0*  MCV 87.3 87.5 88.1  PLT 135* 225 271   Basic Metabolic Panel: Recent Labs  Lab 11/01/23 0458 11/06/23 0811 11/07/23 0348  NA 134* 131* 133*  K 4.2 4.5 4.1  CL 95* 93* 95*  CO2 23 24 28   GLUCOSE 84 79 87  BUN 40* 45* 31*  CREATININE 9.07* 8.69* 6.80*  CALCIUM  9.3 9.7 10.0  MG  --   --  2.1  PHOS  --  4.7*  --    Liver Function Tests: Recent Labs  Lab 11/06/23 0811  ALBUMIN  1.9*   CBG: No results for input(s): GLUCAP in the last 168 hours.  Scheduled Meds:  apixaban   5 mg Oral BID   aspirin  EC  81 mg Oral Daily   atorvastatin   10 mg Oral Daily   calcitRIOL   1.75 mcg Oral Q M,W,F-1800   calcium  acetate  2,001 mg Oral TID WC   Chlorhexidine  Gluconate Cloth  6 each Topical Q0600   cinacalcet    30 mg Oral Q M,W,F-1800   ferrous sulfate   325 mg Oral Q breakfast   gabapentin   100 mg Oral QHS   irbesartan   300 mg Oral Daily   metoprolol  succinate  75 mg Oral Daily   sodium chloride  flush  3 mL Intravenous Q12H   Continuous Infusions: PRN Meds:.acetaminophen  **OR** acetaminophen , acetaminophen , albuterol , bisacodyl , hydrALAZINE , ipratropium-albuterol , labetalol , ondansetron  **OR** ondansetron  (ZOFRAN ) IV, mouth rinse, oxyCODONE , sodium chloride  flush  Family Communication: None at bedside  Disposition: Status is: Inpatient Remains inpatient appropriate because: ESRD     Time spent: 35 minutes  Length of inpatient stay: 13 days  Author: Carliss LELON Canales, DO 11/07/2023 11:58 AM  For on call review [email or web address].

## 2023-11-07 NOTE — Progress Notes (Signed)
  [facility] Progress Note   Subjective:  Seen in room. Completed dialysis yesterday - net UF 4L. Awaiting SNF placement   Objective Vitals:   11/06/23 2316 11/07/23 0333 11/07/23 0535 11/07/23 0804  BP: 138/79 (!) 151/86  (!) 142/80  Pulse: (!) 52 (!) 51  (!) 53  Resp: 20 20  18   Temp: 98.2 F (36.8 C) 98.2 F (36.8 C)  98.5 F (36.9 C)  TempSrc: Oral Oral  Oral  SpO2: 100% 100%  100%  Weight:   71.6 kg   Height:       Physical Exam General:  alert, lying bed, nad  Heart: RRR  Lungs: Clear bilaterally  Abdomen: non tender  Extremities:no LE edema, R BKA Dialysis Access: LU AVF   Filed Weights   11/06/23 0759 11/06/23 1203 11/07/23 0535  Weight: 75.1 kg 71.1 kg 71.6 kg    Intake/Output Summary (Last 24 hours) at 11/07/2023 1052 Last data filed at 11/06/2023 2000 Gross per 24 hour  Intake 100 ml  Output 4000 ml  Net -3900 ml    Additional Objective Labs: Basic Metabolic Panel: Recent Labs  Lab 11/01/23 0458 11/06/23 0811 11/07/23 0348  NA 134* 131* 133*  K 4.2 4.5 4.1  CL 95* 93* 95*  CO2 23 24 28   GLUCOSE 84 79 87  BUN 40* 45* 31*  CREATININE 9.07* 8.69* 6.80*  CALCIUM  9.3 9.7 10.0  PHOS  --  4.7*  --     CBC: Recent Labs  Lab 11/01/23 0458 11/06/23 0811 11/07/23 0348  WBC 10.1 9.1 9.4  NEUTROABS 7.8*  --   --   HGB 9.4* 8.9* 10.7*  HCT 28.8* 28.1* 34.0*  MCV 87.3 87.5 88.1  PLT 135* 225 271    Medications:    apixaban   5 mg Oral BID   aspirin  EC  81 mg Oral Daily   atorvastatin   10 mg Oral Daily   calcitRIOL   1.75 mcg Oral Q M,W,F-1800   calcium  acetate  2,001 mg Oral TID WC   Chlorhexidine  Gluconate Cloth  6 each Topical Q0600   Chlorhexidine  Gluconate Cloth  6 each Topical Q0600   cinacalcet   30 mg Oral Q M,W,F-1800   ferrous sulfate   325 mg Oral Q breakfast   gabapentin   100 mg Oral QHS   irbesartan   300 mg Oral Daily   metoprolol  succinate  75 mg Oral Daily   sodium chloride  flush  3 mL Intravenous Q12H     Dialysis Orders: [facility]  on MWF . EDW 84kg HD Bath 2K/2.5Ca  Time 4:00 Heparin  1000 bolus then 600 units/hr. 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  VVS following.   Left lower extremity cellulitis - s/p IV antibiotics.  ESRD -  HD on MWF. Back on schedule. Next HD Monday  Hypertension/volume  - BP better with UF.  Under EDW now. Will need lower EDW on d/c post BKA.  UF as tolerated. Anemia  - Hgb 10.7.  Received Aranesp  100 on 8/19.  Metabolic bone disease - Calcium /Phos acceptable.  Continue VDRA, sensipar  and binders.  Afib - on Eliquis   Dispo - for SNF placement.   Maisie Ronnald Acosta PA-C [facility] 11/07/2023,10:52 AM

## 2023-11-08 DIAGNOSIS — Z89511 Acquired absence of right leg below knee: Secondary | ICD-10-CM | POA: Diagnosis not present

## 2023-11-08 DIAGNOSIS — I5022 Chronic systolic (congestive) heart failure: Secondary | ICD-10-CM | POA: Diagnosis not present

## 2023-11-08 DIAGNOSIS — G4733 Obstructive sleep apnea (adult) (pediatric): Secondary | ICD-10-CM

## 2023-11-08 DIAGNOSIS — N186 End stage renal disease: Secondary | ICD-10-CM | POA: Diagnosis not present

## 2023-11-08 DIAGNOSIS — I70201 Unspecified atherosclerosis of native arteries of extremities, right leg: Secondary | ICD-10-CM | POA: Diagnosis not present

## 2023-11-08 LAB — CBC
HCT: 30.7 % — ABNORMAL LOW (ref 39.0–52.0)
Hemoglobin: 9.8 g/dL — ABNORMAL LOW (ref 13.0–17.0)
MCH: 27.9 pg (ref 26.0–34.0)
MCHC: 31.9 g/dL (ref 30.0–36.0)
MCV: 87.5 fL (ref 80.0–100.0)
Platelets: 295 K/uL (ref 150–400)
RBC: 3.51 MIL/uL — ABNORMAL LOW (ref 4.22–5.81)
RDW: 17.2 % — ABNORMAL HIGH (ref 11.5–15.5)
WBC: 8.6 K/uL (ref 4.0–10.5)
nRBC: 0 % (ref 0.0–0.2)

## 2023-11-08 LAB — BASIC METABOLIC PANEL WITH GFR
Anion gap: 11 (ref 5–15)
BUN: 45 mg/dL — ABNORMAL HIGH (ref 8–23)
CO2: 27 mmol/L (ref 22–32)
Calcium: 9.9 mg/dL (ref 8.9–10.3)
Chloride: 93 mmol/L — ABNORMAL LOW (ref 98–111)
Creatinine, Ser: 8.82 mg/dL — ABNORMAL HIGH (ref 0.61–1.24)
GFR, Estimated: 6 mL/min — ABNORMAL LOW (ref >60)
Glucose, Bld: 88 mg/dL (ref 70–99)
Potassium: 4.1 mmol/L (ref 3.5–5.1)
Sodium: 131 mmol/L — ABNORMAL LOW (ref 135–145)

## 2023-11-08 LAB — MAGNESIUM: Magnesium: 2.1 mg/dL (ref 1.7–2.4)

## 2023-11-08 LAB — PHOSPHORUS: Phosphorus: 4.8 mg/dL — ABNORMAL HIGH (ref 2.5–4.6)

## 2023-11-08 MED ORDER — HEPARIN SODIUM (PORCINE) 1000 UNIT/ML DIALYSIS
3500.0000 [IU] | INTRAMUSCULAR | Status: DC | PRN
  Administered 2023-11-08: 3500 [IU] via INTRAVENOUS_CENTRAL

## 2023-11-08 MED ORDER — HEPARIN SODIUM (PORCINE) 1000 UNIT/ML IJ SOLN
INTRAMUSCULAR | Status: AC
Start: 2023-11-08 — End: 2023-11-08
  Filled 2023-11-08: qty 4

## 2023-11-08 NOTE — Progress Notes (Signed)
  [facility] Progress Note   Dialysis Orders: [facility]  on MWF . EDW 84kg HD Bath 2K/2.5Ca  Time 4:00 Heparin  1000 bolus then 600 units/hr. 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  VVS following.   Left lower extremity cellulitis - s/p IV antibiotics.  ESRD -  HD on MWF. Back on schedule.  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  Under EDW now. Will need lower EDW on d/c post BKA.  UF as tolerated. Anemia  - Hgb 10.7.  Received Aranesp  100 on 8/19.  Metabolic bone disease - Calcium /Phos acceptable.  Continue VDRA, sensipar  and binders.  Afib - on Eliquis   Dispo - for SNF placement.   Subjective:  Seen on dialysis, tolerating.. Completed dialysis Sat - net UF 4L. Awaiting SNF placement   Objective Vitals:   11/08/23 0832 11/08/23 0900 11/08/23 0930 11/08/23 1000  BP: 129/80 125/80 119/71 116/75  Pulse: (!) 53 (!) 50 (!) 50 (!) 46  Resp: 17 15 14 14   Temp: (!) 97.3 F (36.3 C)     TempSrc: Oral     SpO2: 100% 100% 100% 96%  Weight: 72.4 kg     Height:       Physical Exam General:  alert, lying bed, nad  Heart: RRR  Lungs: Clear bilaterally  Abdomen: non tender  Extremities:no LE edema, R BKA Dialysis Access: LU AVF   Filed Weights   11/07/23 0535 11/08/23 0410 11/08/23 0832  Weight: 71.6 kg 70.1 kg 72.4 kg    Intake/Output Summary (Last 24 hours) at 11/08/2023 1019 Last data filed at 11/08/2023 0802 Gross per 24 hour  Intake 340 ml  Output --  Net 340 ml    Additional Objective Labs: Basic Metabolic Panel: Recent Labs  Lab 11/06/23 0811 11/07/23 0348 11/08/23 0344  NA 131* 133* 131*  K 4.5 4.1 4.1  CL 93* 95* 93*  CO2 24 28 27   GLUCOSE 79  87 88  BUN 45* 31* 45*  CREATININE 8.69* 6.80* 8.82*  CALCIUM  9.7 10.0 9.9  PHOS 4.7*  --  4.8*    CBC: Recent Labs  Lab 11/06/23 0811 11/07/23 0348 11/08/23 0344  WBC 9.1 9.4 8.6  HGB 8.9* 10.7* 9.8*  HCT 28.1* 34.0* 30.7*  MCV 87.5 88.1 87.5  PLT 225 271 295    Medications:    apixaban   5 mg Oral BID   aspirin  EC  81 mg Oral Daily   atorvastatin   10 mg Oral Daily   calcitRIOL   1.75 mcg Oral Q M,W,F-1800   calcium  acetate  2,001 mg Oral TID WC   Chlorhexidine  Gluconate Cloth  6 each Topical Q0600   cinacalcet   30 mg Oral Q M,W,F-1800   ferrous sulfate   325 mg Oral Q breakfast   gabapentin   100 mg Oral QHS   irbesartan   300 mg Oral Daily   metoprolol  succinate  75 mg Oral Daily   sodium chloride  flush  3 mL Intravenous Q12H

## 2023-11-08 NOTE — Plan of Care (Signed)
  Problem: Education: Goal: Knowledge of General Education information will improve Description: Including pain rating scale, medication(s)/side effects and non-pharmacologic comfort measures Outcome: Progressing   Problem: Health Behavior/Discharge Planning: Goal: Ability to manage health-related needs will improve Outcome: Progressing   Problem: Clinical Measurements: Goal: Ability to maintain clinical measurements within normal limits will improve Outcome: Progressing Goal: Will remain free from infection Outcome: Progressing Goal: Diagnostic test results will improve Outcome: Progressing Goal: Respiratory complications will improve Outcome: Progressing Goal: Cardiovascular complication will be avoided Outcome: Progressing   Problem: Activity: Goal: Risk for activity intolerance will decrease Outcome: Progressing   Problem: Nutrition: Goal: Adequate nutrition will be maintained Outcome: Progressing   Problem: Coping: Goal: Level of anxiety will decrease Outcome: Progressing   Problem: Elimination: Goal: Will not experience complications related to bowel motility Outcome: Progressing Goal: Will not experience complications related to urinary retention Outcome: Progressing   Problem: Pain Managment: Goal: General experience of comfort will improve and/or be controlled Outcome: Progressing   Problem: Safety: Goal: Ability to remain free from injury will improve Outcome: Progressing   Problem: Skin Integrity: Goal: Risk for impaired skin integrity will decrease Outcome: Progressing   Problem: Education: Goal: Ability to demonstrate management of disease process will improve Outcome: Progressing Goal: Ability to verbalize understanding of medication therapies will improve Outcome: Progressing   Problem: Activity: Goal: Capacity to carry out activities will improve Outcome: Progressing   Problem: Cardiac: Goal: Ability to achieve and maintain adequate  cardiopulmonary perfusion will improve Outcome: Progressing   Problem: Education: Goal: Knowledge of disease and its progression will improve Outcome: Progressing   Problem: Fluid Volume: Goal: Compliance with measures to maintain balanced fluid volume will improve Outcome: Progressing   Problem: Health Behavior/Discharge Planning: Goal: Ability to manage health-related needs will improve Outcome: Progressing   Problem: Nutritional: Goal: Ability to make healthy dietary choices will improve Outcome: Progressing   Problem: Clinical Measurements: Goal: Complications related to the disease process, condition or treatment will be avoided or minimized Outcome: Progressing   Problem: Education: Goal: Understanding of CV disease, CV risk reduction, and recovery process will improve Outcome: Progressing Goal: Individualized Educational Video(s) Outcome: Progressing   Problem: Activity: Goal: Ability to return to baseline activity level will improve Outcome: Progressing   Problem: Cardiovascular: Goal: Ability to achieve and maintain adequate cardiovascular perfusion will improve Outcome: Progressing Goal: Vascular access site(s) Level 0-1 will be maintained Outcome: Progressing   Problem: Health Behavior/Discharge Planning: Goal: Ability to safely manage health-related needs after discharge will improve Outcome: Progressing

## 2023-11-08 NOTE — Plan of Care (Signed)
  Problem: Nutrition: Goal: Adequate nutrition will be maintained Outcome: Progressing   Problem: Activity: Goal: Risk for activity intolerance will decrease Outcome: Progressing   Problem: Safety: Goal: Ability to remain free from injury will improve Outcome: Progressing   Problem: Pain Managment: Goal: General experience of comfort will improve and/or be controlled Outcome: Progressing   Problem: Skin Integrity: Goal: Risk for impaired skin integrity will decrease Outcome: Progressing   Problem: Cardiac: Goal: Ability to achieve and maintain adequate cardiopulmonary perfusion will improve Outcome: Progressing

## 2023-11-08 NOTE — Progress Notes (Signed)
 Patient seen and examined this afternoon.  Overall, Jacob Daniel appears to be doing pretty well.  No change in the left posterior dermis which has some dry necrosis.  This is going to be managed in the outpatient setting.  BKA with small mount of blistering.  There is some devitalized dermis at the staple line.  Will continue to follow this closely.  As noted previously, Yoshiharu's chances of healing much lower than average due to single-vessel runoff with end-stage renal disease. No plan for any revision at this time.  Should the below-knee amputation fail, would move to above-knee amputation.

## 2023-11-08 NOTE — Progress Notes (Signed)
 Contacted [facility] admission regarding an update on out patient dialysis referral and medical/financial clearance update.   This pt has been declined by [facility] clinic [facility] at this time. Now requesting that admissions try next closest op HD center to SNF. Awaiting feedback.  Will continue to assist.  Latice Waitman Dialysis Navigator [phone]

## 2023-11-08 NOTE — Progress Notes (Signed)
 Progress Note   Patient: Jacob Daniel FMW:[id number] DOB: 10/31/1955 DOA: 10/25/2023  DOS: the patient was seen and examined on 11/08/2023   Brief hospital course:  29M Hx PVD, ESRD on HD (MWF), p/w right foot pain from spider on the left calf 7 days ago treated with doxycycline .  Subsequently found to have critical limb ischemia treated with heparin  and antibiotics.  Attempted recanalization of right posterior tibial artery but was unsuccessful.  Had no other options.  Underwent right-sided BKA. Waiting for skilled nursing facility.   Assessment and Plan:   Critical limb ischemia/severe PVD/RLE - Initially treated with empiric antibiotics.  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  Wound care per vascular surgery.  Completed antibiotics.   LLE cellulitis/nonhealing wound - Believed to be spider bite.  Completed 7 days of IV antibiotics.  Conservative management.   ESRD-HD MWF - Currently off schedule, dialysis.  Nephrology following closely.  Plan to restart back on schedule Monday.   Hypertension - Irbesartan  and metoprolol  on board.   Paroxysmal A-fib - Eliquis  on board.   Anemia of chronic kidney disease - No active bleeding appreciated.  Recheck CBC in AM.   Goals of care - Currently medically stable, awaiting transition to STR.  Pending outpatient dialysis being set up.  Initial clinic had been declined.  TOC working closely with nephrology on disposition planning.   Subjective: Resting comfortably, denies any fever, chills, shortness of breath, chest pain, nausea, vomiting, abdominal pain. Still has some lower extremity pain intermittently.   Physical Exam:  Vitals:   11/08/23 1130 11/08/23 1200 11/08/23 1220 11/08/23 1221  BP: 125/79 106/77 117/82 123/74  Pulse: (!) 51 (!) 52 (!) 54 (!) 49  Resp: (!) 8 20 19  (!) 9  Temp:    98.2 F (36.8 C)  TempSrc:      SpO2: 99% 98% 98% 98%  Weight:    68.8 kg  Height:        GENERAL:  Alert, pleasant, no acute  distress  HEENT:  EOMI CARDIOVASCULAR:  RRR, no murmurs appreciated RESPIRATORY:  Clear to auscultation, no wheezing, rales, or rhonchi GASTROINTESTINAL:  Soft, nontender, nondistended EXTREMITIES: Right BKA NEURO:  No new focal deficits appreciated SKIN:  No rashes noted PSYCH:  Appropriate mood and affect    Data Reviewed:  Imaging Studies: PERIPHERAL VASCULAR CATHETERIZATION Result Date: 10/27/2023 Images from the original result were not included. Patient name: Jacob Daniel MRN: [id number] DOB: [birth year] Sex: male 10/25/2023 - 10/27/2023 Pre-operative Diagnosis: Right lower extremity rest pain Post-operative diagnosis:  Same Surgeon:  Fonda FORBES Rim, MD Procedure Performed: 1.  Ultrasound-guided micropuncture access of the left common femoral artery in retrograde fashion 2.  Aortogram 3.  Second order cannulation, left lower extremity angiogram 4.  Selective angiography of the superficial femoral artery 5.  Selective angiography of the posterior tibial artery 6.  Attempted recanalization of the posterior tibial artery-unsuccessful 7.  Left lower extremity angiogram 8.  Arteriotomy manage with next device Contrast volume 140 cc, sedation time 72 minutes Indications: Patient is a 68 year old male with peripheral arterial disease and right sided rest pain.  He presented to the hospital due to worsening pain in the right toes.  He has a wound on the left calf from a spider bite.  I have discussed the risks and benefits of right lower extremity angiography in an effort to define and improve distal perfusion to alleviate rest pain, Jarrah elected to proceed. Findings: Sluggish flow throughout indicative of heart failure-known  ejection fraction 30 to 35% 6 months ago Aorta: Severe atherosclerotic disease throughout.  No flow-limiting stenosis identified Right leg: Severe atherosclerotic disease throughout.  Widely patent common femoral artery, profunda, superficial femoral artery, popliteal artery.   Distally there is single-vessel peroneal artery outflow filling the foot through collaterals.  The posterior tibial artery has a high takeoff, but occludes.  Severe small vessel disease. Left leg: Severe atherosclerotic disease throughout.  Widely patent common femoral artery, profunda, superficial femoral artery, popliteal artery.  There appears to be three-vessels opacifying in the calf, however distally contrast washout due to heart failure, and distal tibial vessels and pedal vessels were not visualized.  Procedure:  The patient was identified in the holding area and taken to room 8.  The patient was then placed supine on the table and prepped and draped in the usual sterile fashion.  A time out was called.  Moderate sedation was administered using Fentanyl  and Versed .  Ultrasound was used to evaluate the left common femoral artery.  It was patent.  A digital ultrasound image was acquired.  A micropuncture needle was used to access the left common femoral artery under ultrasound guidance.  An 018 wire was advanced without resistance and a micropuncture sheath was placed.  The 018 wire was removed and a benson wire was placed.  The micropuncture sheath was exchanged for a 5 french sheath.  An omniflush catheter was advanced over the wire to the level of L-1.  An abdominal angiogram was obtained.  Next, using the omniflush catheter and a benson wire, the aortic bifurcation was crossed and the catheter was placed into theright external iliac artery and right runoff was obtained.  left runoff was performed via retrograde sheath injections. See results above. I elected to attempt intervention on the posterior tibial artery as it appeared to fill in very small islands from collaterals.  A 6 x 60 cm sheath was brought into the field and parked in the right superficial femoral artery.  The patient was heparinized.  A series of wires and catheters were used to navigate into the high takeoff of the posterior tibial  artery.  A series of wires and catheters were used including 0.014 wires, however I was unable to cross the occlusion.  I elected not to attempt retrograde intervention as outflow was poor.  I closed the percutaneous access site with a Mynx device.  The patient became very aggravated and angry causing the Mynx device to fail and manual pressure was held.  The patient was administered another 1 mg of Versed  to allow for safe arteriotomy management. Impression: Unsuccessful attempt at recanalization of the right posterior tibial artery.  Severe microvascular disease.  Patient is maximally revascularized with no further options.  Pain is multifactorial from sluggish flow due to heart failure and severe peripheral arterial, and microvascular disease. No wounds or rest pain in the right leg.  Patient would benefit from orthopedic surgery evaluation, likely debridement.  Should wound healing be a problem, would happily perform diagnostic angiography with possible intervention on the left leg. Fonda FORBES Rim MD [facility] Office: [phone]   VAS US  ABI WITH/WO TBI Result Date: 10/26/2023  LOWER EXTREMITY DOPPLER STUDY Patient Name:  Jacob Daniel  Date of Exam:   10/26/2023 Medical Rec #: [id number]         Accession #:    [id number] Date of Birth: May 04, 1955         Patient Gender: M Patient Age:  68 years Exam Location:  [facility] Procedure:      VAS US  ABI WITH/WO TBI Referring Phys: JOSHUA ROBINS --------------------------------------------------------------------------------  Indications: critical limb ischemia High Risk Factors: Hypertension, hyperlipidemia, Diabetes, no history of tobacco                    use, marijuana use, coronary artery disease. Other Factors: CHF, Afib, ESRD (HD).  Comparison Study: No previous exams Performing Technologist: Hill, Jody RVT, RDMS  Examination Guidelines: A complete evaluation includes at minimum, Doppler waveform signals and  systolic blood pressure reading at the level of bilateral brachial, anterior tibial, and posterior tibial arteries, when vessel segments are accessible. Bilateral testing is considered an integral part of a complete examination. Photoelectric Plethysmograph (PPG) waveforms and toe systolic pressure readings are included as required and additional duplex testing as needed. Limited examinations for reoccurring indications may be performed as noted.  ABI Findings: +---------+------------------+-----+-------------------+-------------+ Right    Rt Pressure (mmHg)IndexWaveform           Comment       +---------+------------------+-----+-------------------+-------------+ Brachial 168                    multiphasic        Hx of RUE AVF +---------+------------------+-----+-------------------+-------------+ PTA      109               0.65 dampened monophasic              +---------+------------------+-----+-------------------+-------------+ DP       138               0.82 monophasic                       +---------+------------------+-----+-------------------+-------------+ Great Toe0                 0.00 Absent                           +---------+------------------+-----+-------------------+-------------+ +---------+------------------+-----+-------------------+---------+ Left     Lt Pressure (mmHg)IndexWaveform           Comment   +---------+------------------+-----+-------------------+---------+ Brachial                                           HD access +---------+------------------+-----+-------------------+---------+ PTA      254               1.51 dampened monophasic          +---------+------------------+-----+-------------------+---------+ DP       254               1.51 dampened monophasic          +---------+------------------+-----+-------------------+---------+ Great Toe0                 0.00 Absent                        +---------+------------------+-----+-------------------+---------+  Arterial wall calcification precludes accurate ankle pressures and ABIs.  Summary: Right: Resting right ankle-brachial index indicates mild right lower extremity arterial disease, however this may be falsely elevated due to arterial wall calcification. The right toe-brachial index is absent. Left: Resting left ankle-brachial index indicates noncompressible left lower extremity arteries. The left toe-brachial index is absent. *See table(s) above for measurements and observations.  Electronically signed by Penne Colorado MD on 10/26/2023 at 10:07:25 PM.    Final    CT Angio Aortobifemoral W and/or Wo Contrast Result Date: 10/25/2023 CLINICAL DATA:  Claudication or leg ischemia, right foot pain/swelling for 3 days EXAM: CT ANGIOGRAPHY CHEST WITH CONTRAST TECHNIQUE: Multidetector CT imaging of the abdomen and pelvis with run-off to both lower extremities was performed using the standard protocol during bolus administration of intravenous contrast. Multiplanar CT image reconstructions and MIPs were obtained to evaluate the vascular anatomy. RADIATION DOSE REDUCTION: This exam was performed according to the departmental dose-optimization program which includes automated exposure control, adjustment of the mA and/or kV according to patient size and/or use of iterative reconstruction technique. CONTRAST:  OMNIPAQUE  IOHEXOL  350 MG/ML SOLN COMPARISON:  May 04, 2023 FINDINGS: VASCULAR Aorta: No aneurysm or aortic dissection. No acute thrombus. Diffuse calcified atherosclerosis throughout. Celiac: Patent without aneurysm or dissection.No hemodynamically significant stenosis. SMA: Patent without aneurysm or dissection.No hemodynamically significant stenosis. Renals: Patent without aneurysm or dissection.Diffusely calcified with moderate stenoses of both renal ostia from calcified plaques. IMA: Patent without aneurysm or dissection.Moderate to severe  stenosis of the ostium due to calcified plaque. RIGHT Lower Extremity Inflow: Common, internal and external iliac arteries are patent without aneurysm, acute thrombus, or dissection. Diffuse calcified atherosclerosis throughout without hemodynamically significant stenosis. Outflow: The common and proximal superficial femoral arteries are patent with diffuse calcified plaque throughout. The profunda femoral artery is also patent with diffuse calcified atherosclerosis throughout. Decreased opacification of the mid and distal superficial femoral artery with thrombosis of the popliteal artery starting in the above knee segment. Runoff: Extensive calcified atherosclerosis throughout the trifurcation vessels, which are not opacified. LEFT Lower Extremity Inflow: Common, internal and external iliac arteries are patent without aneurysm, acute thrombus, or dissection. Diffuse calcified atherosclerosis throughout without hemodynamically significant stenosis. Outflow: Common, superficial, and profunda femoral are patent without acute thrombus, aneurysm, or dissection. Diffuse calcified atherosclerosis throughout the common and superficial femoral arteries without hemodynamically significant stenosis. The popliteal artery is also diffusely calcified. There is delayed perfusion of the above and behind knee popliteal artery. Runoff: Extensive calcified atherosclerosis throughout the trifurcation vessels, which are not opacified. Veins: Not evaluated due to the phase of contrast. Review of the MIP images confirms the above findings. NON-VASCULAR Lower chest: Mild diffuse prominence of the pulmonary interstitium with intralobular septal thickening.Bibasilar atelectasis. Mild cardiomegaly with densely calcified coronary arteries. Hepatobiliary: No mass.Multiple small radiopaque gallstones. No wall thickening.No intrahepatic or extrahepatic biliary ductal dilation. Pancreas: No mass or main ductal dilation.No peripancreatic  inflammation or fluid collection. Spleen: Normal size. No mass. Adrenals/Urinary Tract: No adrenal masses. The renal cortices of both kidneys are full of cysts. No hydronephrosis or nephrolithiasis. The urinary bladder is completely decompressed. 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 Stomach/Bowel: The stomach is decompressed without focal abnormality. No small bowel wall thickening or inflammation. No small bowel obstruction. Scattered total colonic diverticulosis. Vascular/Lymphatic: No intraabdominal or pelvic lymphadenopathy. Reproductive: No prostatomegaly.No free pelvic fluid. Other: Moderate volume ascites.  No pneumoperitoneum. Musculoskeletal: No acute fracture or destructive lesion.Subcutaneous edema in both calves. Bone changes consistent with renal osteodystrophy. Multilevel degenerative disc disease of the spine. IMPRESSION: VASCULAR No abdominal aortic aneurysm or aortic dissection. Extensive aortic atherosclerosis without hemodynamically significant stenosis. Severe peripheral vascular atherosclerosis also noted in both legs. Aortic Atherosclerosis (ICD10-I70.0). Right leg: 1. Diffuse calcified atherosclerosis throughout the inflow and proximal outflow vessels. Decreased opacification of the mid and distal superficial femoral artery with thrombosis of the popliteal  artery starting in the above knee segment. 2. Non opacification of the trifurcation vessels below the knee, which may be due to non opacification or thrombosis. Left leg: 1. No acute thrombus or hemodynamically significant stenosis within the inflow or proximal outflow vessels. 2. Delayed contrast opacification of the above and behind knee popliteal artery without significant opacification of the trifurcation vessels below the knee, possibly due to slow flow. NON-VASCULAR 1. Mild prominence of the pulmonary interstitium in both lung bases with minimal intralobular septal thickening, likely representing early changes of  edema. 2. Total colonic diverticulosis. No changes of acute diverticulitis. 3. Few small calcified gallstones present. 4. Moderate volume ascites. Critical Value/emergent results were called by telephone at the time of interpretation on 10/25/2023 at 3:28 pm to provider BERNARDINO FIREMAN , who verbally acknowledged these results. Electronically Signed   By: Rogelia Myers M.D.   On: 10/25/2023 15:37   DG Foot 2 Views Right Result Date: 10/25/2023 CLINICAL DATA:  Foot pain, second digit EXAM: RIGHT FOOT - 2 VIEW COMPARISON:  None Available. FINDINGS: There is no evidence of fracture or dislocation. Sclerotic lesion in distal end of the second metatarsal bone likely bone island, similar to prior exam from 2021. There is no evidence of arthropathy or other focal bone abnormality. Small calcaneal spur. Diffuse vascular calcifications. Soft tissues are otherwise unremarkable. IMPRESSION: No acute findings. Electronically Signed   By: Megan  Zare M.D.   On: 10/25/2023 12:05    There are no new results to review at this time.  Previous records (including but not limited to H&P, progress notes, nursing notes, TOC management) were reviewed in assessment of this patient.  Labs: CBC: Recent Labs  Lab 11/06/23 0811 11/07/23 0348 11/08/23 0344  WBC 9.1 9.4 8.6  HGB 8.9* 10.7* 9.8*  HCT 28.1* 34.0* 30.7*  MCV 87.5 88.1 87.5  PLT 225 271 295   Basic Metabolic Panel: Recent Labs  Lab 11/06/23 0811 11/07/23 0348 11/08/23 0344  NA 131* 133* 131*  K 4.5 4.1 4.1  CL 93* 95* 93*  CO2 24 28 27   GLUCOSE 79 87 88  BUN 45* 31* 45*  CREATININE 8.69* 6.80* 8.82*  CALCIUM  9.7 10.0 9.9  MG  --  2.1 2.1  PHOS 4.7*  --  4.8*   Liver Function Tests: Recent Labs  Lab 11/06/23 0811  ALBUMIN  1.9*   CBG: No results for input(s): GLUCAP in the last 168 hours.  Scheduled Meds:  apixaban   5 mg Oral BID   aspirin  EC  81 mg Oral Daily   atorvastatin   10 mg Oral Daily   calcitRIOL   1.75 mcg Oral Q M,W,F-1800    calcium  acetate  2,001 mg Oral TID WC   Chlorhexidine  Gluconate Cloth  6 each Topical Q0600   cinacalcet   30 mg Oral Q M,W,F-1800   ferrous sulfate   325 mg Oral Q breakfast   gabapentin   100 mg Oral QHS   irbesartan   300 mg Oral Daily   metoprolol  succinate  75 mg Oral Daily   sodium chloride  flush  3 mL Intravenous Q12H   Continuous Infusions: PRN Meds:.acetaminophen  **OR** acetaminophen , acetaminophen , albuterol , bisacodyl , heparin , hydrALAZINE , ipratropium-albuterol , labetalol , ondansetron  **OR** ondansetron  (ZOFRAN ) IV, mouth rinse, oxyCODONE , sodium chloride  flush  Family Communication: Mom  Disposition: Status is: Inpatient Remains inpatient appropriate because: Right BKA, ESRD     Time spent: 34 minutes  Length of inpatient stay: 14 days  Author: Carliss LELON Canales, DO 11/08/2023 12:50 PM  For on call review  [email or web address]

## 2023-11-08 NOTE — Progress Notes (Addendum)
 PT Cancellation Note  Patient Details Name: Jacob Daniel MRN: [id number] DOB: 1956-01-27   Cancelled Treatment:    Reason Eval/Treat Not Completed: Patient declined, no reason specified (pt stating that he is not going to get up when he doens't feel like it. He reports if he wants to get up sometime he will. pt states he will only work with therapy if he wants to work with therapy. He has refused 3x and states he wants to be discharged from physical therapy at this time despite education. Pt will be discharged at this time please re-consult when pt is ready to work with physical therapy.)  Dorothyann Maier, DPT, CLT  Acute Rehabilitation Services Office: [phone] (Secure chat preferred)  Charged visit but no units due to 16 min of face to face time with patient.    Dorothyann VEAR Maier 11/08/2023, 3:32 PM

## 2023-11-09 DIAGNOSIS — Z8679 Personal history of other diseases of the circulatory system: Secondary | ICD-10-CM

## 2023-11-09 DIAGNOSIS — N186 End stage renal disease: Secondary | ICD-10-CM | POA: Diagnosis not present

## 2023-11-09 DIAGNOSIS — I70201 Unspecified atherosclerosis of native arteries of extremities, right leg: Secondary | ICD-10-CM | POA: Diagnosis not present

## 2023-11-09 DIAGNOSIS — I5022 Chronic systolic (congestive) heart failure: Secondary | ICD-10-CM | POA: Diagnosis not present

## 2023-11-09 LAB — RENAL FUNCTION PANEL
Albumin: 2.2 g/dL — ABNORMAL LOW (ref 3.5–5.0)
Anion gap: 13 (ref 5–15)
BUN: 35 mg/dL — ABNORMAL HIGH (ref 8–23)
CO2: 25 mmol/L (ref 22–32)
Calcium: 9.4 mg/dL (ref 8.9–10.3)
Chloride: 90 mmol/L — ABNORMAL LOW (ref 98–111)
Creatinine, Ser: 7.04 mg/dL — ABNORMAL HIGH (ref 0.61–1.24)
GFR, Estimated: 8 mL/min — ABNORMAL LOW (ref >60)
Glucose, Bld: 90 mg/dL (ref 70–99)
Phosphorus: 3.7 mg/dL (ref 2.5–4.6)
Potassium: 4.4 mmol/L (ref 3.5–5.1)
Sodium: 128 mmol/L — ABNORMAL LOW (ref 135–145)

## 2023-11-09 LAB — CBC
HCT: 32.1 % — ABNORMAL LOW (ref 39.0–52.0)
Hemoglobin: 10.4 g/dL — ABNORMAL LOW (ref 13.0–17.0)
MCH: 28 pg (ref 26.0–34.0)
MCHC: 32.4 g/dL (ref 30.0–36.0)
MCV: 86.5 fL (ref 80.0–100.0)
Platelets: 338 K/uL (ref 150–400)
RBC: 3.71 MIL/uL — ABNORMAL LOW (ref 4.22–5.81)
RDW: 17 % — ABNORMAL HIGH (ref 11.5–15.5)
WBC: 9.7 K/uL (ref 4.0–10.5)
nRBC: 0 % (ref 0.0–0.2)

## 2023-11-09 MED ORDER — PENTAFLUOROPROP-TETRAFLUOROETH EX AERO: 1.0000 | INHALATION_SPRAY | CUTANEOUS | Status: DC | PRN

## 2023-11-09 MED ORDER — HEPARIN SODIUM (PORCINE) 1000 UNIT/ML DIALYSIS: 1000.0000 [IU] | INTRAMUSCULAR | Status: DC | PRN

## 2023-11-09 MED ORDER — ALTEPLASE 2 MG IJ SOLR: 2.0000 mg | Freq: Once | INTRAMUSCULAR | Status: DC | PRN

## 2023-11-09 MED ORDER — LIDOCAINE HCL (PF) 1 % IJ SOLN: 5.0000 mL | INTRAMUSCULAR | Status: DC | PRN

## 2023-11-09 MED ORDER — PHENYLEPHRINE HCL-NACL 20-0.9 MG/250ML-% IV SOLN
INTRAVENOUS | Status: AC
  Filled 2023-11-09: qty 500

## 2023-11-09 MED ORDER — ANTICOAGULANT SODIUM CITRATE 4% (200MG/5ML) IV SOLN: 5.0000 mL | Status: DC | PRN

## 2023-11-09 MED ORDER — HEPARIN SODIUM (PORCINE) 1000 UNIT/ML DIALYSIS: 1200.0000 [IU] | INTRAMUSCULAR | Status: DC | PRN

## 2023-11-09 MED ORDER — LIDOCAINE-PRILOCAINE 2.5-2.5 % EX CREA: 1.0000 | TOPICAL_CREAM | CUTANEOUS | Status: DC | PRN

## 2023-11-09 MED ORDER — PROPOFOL 1000 MG/100ML IV EMUL
INTRAVENOUS | Status: AC
  Filled 2023-11-09: qty 200

## 2023-11-09 MED ORDER — LIDOCAINE-PRILOCAINE 2.5-2.5 % EX CREA
1.0000 | TOPICAL_CREAM | CUTANEOUS | Status: DC | PRN
Start: 2023-11-09 — End: 2023-11-10

## 2023-11-09 NOTE — Progress Notes (Signed)
 Progress Note   Patient: Jacob Daniel FMW:[id number] DOB: [birth year] DOA: 10/25/2023  DOS: the patient was seen and examined on 11/09/2023   Brief hospital course:  80M Hx PVD, ESRD on HD (MWF), p/w right foot pain from spider on the left calf 7 days ago treated with doxycycline .  Subsequently found to have critical limb ischemia treated with heparin  and antibiotics.  Attempted recanalization of right posterior tibial artery but was unsuccessful.  Had no other options.  Underwent right-sided BKA. Waiting for skilled nursing facility.   Assessment and Plan:   Critical limb ischemia/severe PVD/RLE - Initially treated with empiric antibiotics.  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  Wound care per vascular surgery.  Completed antibiotics.   LLE cellulitis/nonhealing wound - Believed to be spider bite.  Completed 7 days of IV antibiotics.  Conservative management.   ESRD-HD MWF - Initially off schedule.  Now back on schedule as of Monday.   Hypertension - Irbesartan  and metoprolol  on board.   Paroxysmal A-fib - Eliquis  on board.   Anemia of chronic kidney disease - No active bleeding appreciated.  Recheck CBC in AM.  Physical debilitation muscle weakness - PT/OT on board.  Patient appears to be intermittently compliant with physical therapy.  Encouraged him to work with PT as it may limit his disposition to SNF.   Goals of care - Currently medically stable, awaiting transition to STR.  Pending outpatient dialysis being set up.  Initial clinic had been declined.  TOC working closely with nephrology on disposition planning.   Subjective: Patient resting comfortably this morning.  Denies any fever, chills, chest pain, nausea vomiting, abdominal pain.  Does have some right lower extremity pain as to be expected.  Reportedly refused to work with PT.  Physical Exam:  Vitals:   11/09/23 0603 11/09/23 0800 11/09/23 0914 11/09/23 1151  BP:  126/75 137/84   Pulse:  (!) 54 (!) 59    Resp:  15 16   Temp:  98.1 F (36.7 C) (!) 97.4 F (36.3 C) 98 F (36.7 C)  TempSrc:  Oral  Oral  SpO2:  96% 100%   Weight: 73.3 kg     Height:        GENERAL:  Alert, pleasant, no acute distress  HEENT:  EOMI CARDIOVASCULAR:  RRR, no murmurs appreciated RESPIRATORY:  Clear to auscultation, no wheezing, rales, or rhonchi GASTROINTESTINAL:  Soft, nontender, nondistended EXTREMITIES: Right BKA NEURO:  No new focal deficits appreciated SKIN:  No rashes noted PSYCH:  Appropriate mood and affect    Data Reviewed:  Imaging Studies: PERIPHERAL VASCULAR CATHETERIZATION Result Date: 10/27/2023 Images from the original result were not included. Patient name: Jacob Daniel MRN: [id number] DOB: [birth year] Sex: male 10/25/2023 - 10/27/2023 Pre-operative Diagnosis: Right lower extremity rest pain Post-operative diagnosis:  Same Surgeon:  Fonda FORBES Rim, MD Procedure Performed: 1.  Ultrasound-guided micropuncture access of the left common femoral artery in retrograde fashion 2.  Aortogram 3.  Second order cannulation, left lower extremity angiogram 4.  Selective angiography of the superficial femoral artery 5.  Selective angiography of the posterior tibial artery 6.  Attempted recanalization of the posterior tibial artery-unsuccessful 7.  Left lower extremity angiogram 8.  Arteriotomy manage with next device Contrast volume 140 cc, sedation time 72 minutes Indications: Patient is a 68 year old male with peripheral arterial disease and right sided rest pain.  He presented to the hospital due to worsening pain in the right toes.  He has a wound on the  left calf from a spider bite.  I have discussed the risks and benefits of right lower extremity angiography in an effort to define and improve distal perfusion to alleviate rest pain, Tully elected to proceed. Findings: Sluggish flow throughout indicative of heart failure-known ejection fraction 30 to 35% 6 months ago Aorta: Severe atherosclerotic disease  throughout.  No flow-limiting stenosis identified Right leg: Severe atherosclerotic disease throughout.  Widely patent common femoral artery, profunda, superficial femoral artery, popliteal artery.  Distally there is single-vessel peroneal artery outflow filling the foot through collaterals.  The posterior tibial artery has a high takeoff, but occludes.  Severe small vessel disease. Left leg: Severe atherosclerotic disease throughout.  Widely patent common femoral artery, profunda, superficial femoral artery, popliteal artery.  There appears to be three-vessels opacifying in the calf, however distally contrast washout due to heart failure, and distal tibial vessels and pedal vessels were not visualized.  Procedure:  The patient was identified in the holding area and taken to room 8.  The patient was then placed supine on the table and prepped and draped in the usual sterile fashion.  A time out was called.  Moderate sedation was administered using Fentanyl  and Versed .  Ultrasound was used to evaluate the left common femoral artery.  It was patent.  A digital ultrasound image was acquired.  A micropuncture needle was used to access the left common femoral artery under ultrasound guidance.  An 018 wire was advanced without resistance and a micropuncture sheath was placed.  The 018 wire was removed and a benson wire was placed.  The micropuncture sheath was exchanged for a 5 french sheath.  An omniflush catheter was advanced over the wire to the level of L-1.  An abdominal angiogram was obtained.  Next, using the omniflush catheter and a benson wire, the aortic bifurcation was crossed and the catheter was placed into theright external iliac artery and right runoff was obtained.  left runoff was performed via retrograde sheath injections. See results above. I elected to attempt intervention on the posterior tibial artery as it appeared to fill in very small islands from collaterals.  A 6 x 60 cm sheath was brought into  the field and parked in the right superficial femoral artery.  The patient was heparinized.  A series of wires and catheters were used to navigate into the high takeoff of the posterior tibial artery.  A series of wires and catheters were used including 0.014 wires, however I was unable to cross the occlusion.  I elected not to attempt retrograde intervention as outflow was poor.  I closed the percutaneous access site with a Mynx device.  The patient became very aggravated and angry causing the Mynx device to fail and manual pressure was held.  The patient was administered another 1 mg of Versed  to allow for safe arteriotomy management. Impression: Unsuccessful attempt at recanalization of the right posterior tibial artery.  Severe microvascular disease.  Patient is maximally revascularized with no further options.  Pain is multifactorial from sluggish flow due to heart failure and severe peripheral arterial, and microvascular disease. No wounds or rest pain in the right leg.  Patient would benefit from orthopedic surgery evaluation, likely debridement.  Should wound healing be a problem, would happily perform diagnostic angiography with possible intervention on the left leg. Fonda FORBES Rim MD [facility] Office: [phone]   VAS US  ABI WITH/WO TBI Result Date: 10/26/2023  LOWER EXTREMITY DOPPLER STUDY Patient Name:  Cesareo Vickrey  Claudene  Date of Exam:   10/26/2023 Medical Rec #: [id number]         Accession #:    [id number] Date of Birth: March 08, 1956         Patient Gender: M Patient Age:   16 years Exam Location:  [facility] Procedure:      VAS US  ABI WITH/WO TBI Referring Phys: JOSHUA ROBINS --------------------------------------------------------------------------------  Indications: critical limb ischemia High Risk Factors: Hypertension, hyperlipidemia, Diabetes, no history of tobacco                    use, marijuana use, coronary artery disease. Other Factors: CHF, Afib,  ESRD (HD).  Comparison Study: No previous exams Performing Technologist: Hill, Jody RVT, RDMS  Examination Guidelines: A complete evaluation includes at minimum, Doppler waveform signals and systolic blood pressure reading at the level of bilateral brachial, anterior tibial, and posterior tibial arteries, when vessel segments are accessible. Bilateral testing is considered an integral part of a complete examination. Photoelectric Plethysmograph (PPG) waveforms and toe systolic pressure readings are included as required and additional duplex testing as needed. Limited examinations for reoccurring indications may be performed as noted.  ABI Findings: +---------+------------------+-----+-------------------+-------------+ Right    Rt Pressure (mmHg)IndexWaveform           Comment       +---------+------------------+-----+-------------------+-------------+ Brachial 168                    multiphasic        Hx of RUE AVF +---------+------------------+-----+-------------------+-------------+ PTA      109               0.65 dampened monophasic              +---------+------------------+-----+-------------------+-------------+ DP       138               0.82 monophasic                       +---------+------------------+-----+-------------------+-------------+ Great Toe0                 0.00 Absent                           +---------+------------------+-----+-------------------+-------------+ +---------+------------------+-----+-------------------+---------+ Left     Lt Pressure (mmHg)IndexWaveform           Comment   +---------+------------------+-----+-------------------+---------+ Brachial                                           HD access +---------+------------------+-----+-------------------+---------+ PTA      254               1.51 dampened monophasic          +---------+------------------+-----+-------------------+---------+ DP       254               1.51  dampened monophasic          +---------+------------------+-----+-------------------+---------+ Great Toe0                 0.00 Absent                       +---------+------------------+-----+-------------------+---------+  Arterial wall calcification precludes accurate ankle pressures and ABIs.  Summary: Right: Resting right ankle-brachial index indicates mild right lower  extremity arterial disease, however this may be falsely elevated due to arterial wall calcification. The right toe-brachial index is absent. Left: Resting left ankle-brachial index indicates noncompressible left lower extremity arteries. The left toe-brachial index is absent. *See table(s) above for measurements and observations.  Electronically signed by Penne Colorado MD on 10/26/2023 at 10:07:25 PM.    Final    CT Angio Aortobifemoral W and/or Wo Contrast Result Date: 10/25/2023 CLINICAL DATA:  Claudication or leg ischemia, right foot pain/swelling for 3 days EXAM: CT ANGIOGRAPHY CHEST WITH CONTRAST TECHNIQUE: Multidetector CT imaging of the abdomen and pelvis with run-off to both lower extremities was performed using the standard protocol during bolus administration of intravenous contrast. Multiplanar CT image reconstructions and MIPs were obtained to evaluate the vascular anatomy. RADIATION DOSE REDUCTION: This exam was performed according to the departmental dose-optimization program which includes automated exposure control, adjustment of the mA and/or kV according to patient size and/or use of iterative reconstruction technique. CONTRAST:  OMNIPAQUE  IOHEXOL  350 MG/ML SOLN COMPARISON:  May 04, 2023 FINDINGS: VASCULAR Aorta: No aneurysm or aortic dissection. No acute thrombus. Diffuse calcified atherosclerosis throughout. Celiac: Patent without aneurysm or dissection.No hemodynamically significant stenosis. SMA: Patent without aneurysm or dissection.No hemodynamically significant stenosis. Renals: Patent without  aneurysm or dissection.Diffusely calcified with moderate stenoses of both renal ostia from calcified plaques. IMA: Patent without aneurysm or dissection.Moderate to severe stenosis of the ostium due to calcified plaque. RIGHT Lower Extremity Inflow: Common, internal and external iliac arteries are patent without aneurysm, acute thrombus, or dissection. Diffuse calcified atherosclerosis throughout without hemodynamically significant stenosis. Outflow: The common and proximal superficial femoral arteries are patent with diffuse calcified plaque throughout. The profunda femoral artery is also patent with diffuse calcified atherosclerosis throughout. Decreased opacification of the mid and distal superficial femoral artery with thrombosis of the popliteal artery starting in the above knee segment. Runoff: Extensive calcified atherosclerosis throughout the trifurcation vessels, which are not opacified. LEFT Lower Extremity Inflow: Common, internal and external iliac arteries are patent without aneurysm, acute thrombus, or dissection. Diffuse calcified atherosclerosis throughout without hemodynamically significant stenosis. Outflow: Common, superficial, and profunda femoral are patent without acute thrombus, aneurysm, or dissection. Diffuse calcified atherosclerosis throughout the common and superficial femoral arteries without hemodynamically significant stenosis. The popliteal artery is also diffusely calcified. There is delayed perfusion of the above and behind knee popliteal artery. Runoff: Extensive calcified atherosclerosis throughout the trifurcation vessels, which are not opacified. Veins: Not evaluated due to the phase of contrast. Review of the MIP images confirms the above findings. NON-VASCULAR Lower chest: Mild diffuse prominence of the pulmonary interstitium with intralobular septal thickening.Bibasilar atelectasis. Mild cardiomegaly with densely calcified coronary arteries. Hepatobiliary: No mass.Multiple  small radiopaque gallstones. No wall thickening.No intrahepatic or extrahepatic biliary ductal dilation. Pancreas: No mass or main ductal dilation.No peripancreatic inflammation or fluid collection. Spleen: Normal size. No mass. Adrenals/Urinary Tract: No adrenal masses. The renal cortices of both kidneys are full of cysts. No hydronephrosis or nephrolithiasis. The urinary bladder is completely decompressed. 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 Stomach/Bowel: The stomach is decompressed without focal abnormality. No small bowel wall thickening or inflammation. No small bowel obstruction. Scattered total colonic diverticulosis. Vascular/Lymphatic: No intraabdominal or pelvic lymphadenopathy. Reproductive: No prostatomegaly.No free pelvic fluid. Other: Moderate volume ascites.  No pneumoperitoneum. Musculoskeletal: No acute fracture or destructive lesion.Subcutaneous edema in both calves. Bone changes consistent with renal osteodystrophy. Multilevel degenerative disc disease of the spine. IMPRESSION: VASCULAR No abdominal aortic aneurysm or aortic dissection. Extensive aortic  atherosclerosis without hemodynamically significant stenosis. Severe peripheral vascular atherosclerosis also noted in both legs. Aortic Atherosclerosis (ICD10-I70.0). Right leg: 1. Diffuse calcified atherosclerosis throughout the inflow and proximal outflow vessels. Decreased opacification of the mid and distal superficial femoral artery with thrombosis of the popliteal artery starting in the above knee segment. 2. Non opacification of the trifurcation vessels below the knee, which may be due to non opacification or thrombosis. Left leg: 1. No acute thrombus or hemodynamically significant stenosis within the inflow or proximal outflow vessels. 2. Delayed contrast opacification of the above and behind knee popliteal artery without significant opacification of the trifurcation vessels below the knee, possibly due to slow flow.  NON-VASCULAR 1. Mild prominence of the pulmonary interstitium in both lung bases with minimal intralobular septal thickening, likely representing early changes of edema. 2. Total colonic diverticulosis. No changes of acute diverticulitis. 3. Few small calcified gallstones present. 4. Moderate volume ascites. Critical Value/emergent results were called by telephone at the time of interpretation on 10/25/2023 at 3:28 pm to provider BERNARDINO FIREMAN , who verbally acknowledged these results. Electronically Signed   By: Rogelia Myers M.D.   On: 10/25/2023 15:37   DG Foot 2 Views Right Result Date: 10/25/2023 CLINICAL DATA:  Foot pain, second digit EXAM: RIGHT FOOT - 2 VIEW COMPARISON:  None Available. FINDINGS: There is no evidence of fracture or dislocation. Sclerotic lesion in distal end of the second metatarsal bone likely bone island, similar to prior exam from 2021. There is no evidence of arthropathy or other focal bone abnormality. Small calcaneal spur. Diffuse vascular calcifications. Soft tissues are otherwise unremarkable. IMPRESSION: No acute findings. Electronically Signed   By: Megan  Zare M.D.   On: 10/25/2023 12:05    There are no new results to review at this time.  Previous records (including but not limited to H&P, progress notes, nursing notes, TOC management) were reviewed in assessment of this patient.  Labs: CBC: Recent Labs  Lab 11/06/23 0811 11/07/23 0348 11/08/23 0344 11/09/23 1124  WBC 9.1 9.4 8.6 9.7  HGB 8.9* 10.7* 9.8* 10.4*  HCT 28.1* 34.0* 30.7* 32.1*  MCV 87.5 88.1 87.5 86.5  PLT 225 271 295 338   Basic Metabolic Panel: Recent Labs  Lab 11/06/23 0811 11/07/23 0348 11/08/23 0344  NA 131* 133* 131*  K 4.5 4.1 4.1  CL 93* 95* 93*  CO2 24 28 27   GLUCOSE 79 87 88  BUN 45* 31* 45*  CREATININE 8.69* 6.80* 8.82*  CALCIUM  9.7 10.0 9.9  MG  --  2.1 2.1  PHOS 4.7*  --  4.8*   Liver Function Tests: Recent Labs  Lab 11/06/23 0811  ALBUMIN  1.9*   CBG: No  results for input(s): GLUCAP in the last 168 hours.  Scheduled Meds:  apixaban   5 mg Oral BID   aspirin  EC  81 mg Oral Daily   atorvastatin   10 mg Oral Daily   calcitRIOL   1.75 mcg Oral Q M,W,F-1800   calcium  acetate  2,001 mg Oral TID WC   Chlorhexidine  Gluconate Cloth  6 each Topical Q0600   cinacalcet   30 mg Oral Q M,W,F-1800   ferrous sulfate   325 mg Oral Q breakfast   gabapentin   100 mg Oral QHS   irbesartan   300 mg Oral Daily   metoprolol  succinate  75 mg Oral Daily   sodium chloride  flush  3 mL Intravenous Q12H   Continuous Infusions: PRN Meds:.acetaminophen  **OR** acetaminophen , acetaminophen , albuterol , bisacodyl , hydrALAZINE , ipratropium-albuterol , labetalol , ondansetron  **OR** ondansetron  (ZOFRAN ) IV,  mouth rinse, oxyCODONE , sodium chloride  flush  Family Communication: None at bedside  Disposition: Status is: Inpatient Remains inpatient appropriate because: ESRD, BKA     Time spent: 31 minutes  Length of inpatient stay: 15 days  Author: Carliss LELON Canales, DO 11/09/2023 12:42 PM  For on call review [email or web address].

## 2023-11-09 NOTE — Progress Notes (Signed)
 OT Cancellation Note  Patient Details Name: Jacob Daniel MRN: [id number] DOB: 1955-11-03   Cancelled Treatment:    Reason Eval/Treat Not Completed: (P) Patient declined, no reason specified, stated he wanted more sleep today and asked to return tomorrow, will return tomorrow.  Elouise JONELLE Bott 11/09/2023, 12:30 PM

## 2023-11-09 NOTE — Progress Notes (Addendum)
 Pt has been denied from all outpatient HD [facility] in [place] at this time due to prior behavior. First 5 closest clinics to SNF selected are [facility]. Notified case management team and SW to inform and list other options. Awaiting feedback.  Will continue to assist Othel Hoogendoorn Dialysis Navigator [phone]

## 2023-11-09 NOTE — Care Management Important Message (Signed)
 Important Message  Patient Details  Name: Jacob Daniel MRN: [id number] Date of Birth: [birth year]   Important Message Given:  Yes - Medicare IM     Claretta Deed 11/09/2023, 4:00 PM

## 2023-11-09 NOTE — Plan of Care (Signed)
  Problem: Education: Goal: Knowledge of General Education information will improve Description: Including pain rating scale, medication(s)/side effects and non-pharmacologic comfort measures Outcome: Progressing   Problem: Health Behavior/Discharge Planning: Goal: Ability to manage health-related needs will improve Outcome: Progressing   Problem: Clinical Measurements: Goal: Ability to maintain clinical measurements within normal limits will improve Outcome: Progressing Goal: Will remain free from infection Outcome: Progressing Goal: Diagnostic test results will improve Outcome: Progressing Goal: Respiratory complications will improve Outcome: Progressing Goal: Cardiovascular complication will be avoided Outcome: Progressing   Problem: Activity: Goal: Risk for activity intolerance will decrease Outcome: Progressing   Problem: Nutrition: Goal: Adequate nutrition will be maintained Outcome: Progressing   Problem: Coping: Goal: Level of anxiety will decrease Outcome: Progressing   Problem: Elimination: Goal: Will not experience complications related to bowel motility Outcome: Progressing Goal: Will not experience complications related to urinary retention Outcome: Progressing   Problem: Pain Managment: Goal: General experience of comfort will improve and/or be controlled Outcome: Progressing   Problem: Safety: Goal: Ability to remain free from injury will improve Outcome: Progressing   Problem: Skin Integrity: Goal: Risk for impaired skin integrity will decrease Outcome: Progressing   Problem: Education: Goal: Ability to demonstrate management of disease process will improve Outcome: Progressing Goal: Ability to verbalize understanding of medication therapies will improve Outcome: Progressing   Problem: Activity: Goal: Capacity to carry out activities will improve Outcome: Progressing   Problem: Cardiac: Goal: Ability to achieve and maintain adequate  cardiopulmonary perfusion will improve Outcome: Progressing   Problem: Education: Goal: Knowledge of disease and its progression will improve Outcome: Progressing   Problem: Fluid Volume: Goal: Compliance with measures to maintain balanced fluid volume will improve Outcome: Progressing   Problem: Health Behavior/Discharge Planning: Goal: Ability to manage health-related needs will improve Outcome: Progressing   Problem: Nutritional: Goal: Ability to make healthy dietary choices will improve Outcome: Progressing   Problem: Clinical Measurements: Goal: Complications related to the disease process, condition or treatment will be avoided or minimized Outcome: Progressing   Problem: Education: Goal: Understanding of CV disease, CV risk reduction, and recovery process will improve Outcome: Progressing Goal: Individualized Educational Video(s) Outcome: Progressing   Problem: Activity: Goal: Ability to return to baseline activity level will improve Outcome: Progressing   Problem: Cardiovascular: Goal: Ability to achieve and maintain adequate cardiovascular perfusion will improve Outcome: Progressing Goal: Vascular access site(s) Level 0-1 will be maintained Outcome: Progressing   Problem: Health Behavior/Discharge Planning: Goal: Ability to safely manage health-related needs after discharge will improve Outcome: Progressing

## 2023-11-09 NOTE — TOC Progression Note (Signed)
 Transition of Care [facility]) - Progression Note    Patient Details  Name: Jacob Daniel MRN: [id number] Date of Birth: 04/29/1955  [facility] CM/SW Contact  Montie LOISE Louder, [place] Phone Number: 11/09/2023, 1:37 PM  Clinical Narrative:     CSW met with patient at bedside. CSW explained the importance of working with therapy. Patient states understanding and agrees to work with therapy.   CSW explained , Renal Navigator is having difficulty securing HD seat here in [place]. CSW advised, reaching out to SNFs in [name] that can transport him to his home HD clinic. Patient states understanding on this plan and agrees. Patient inquired about anticipated d/c date, CSW explained once we have secured him a SNF,  TOC will follow up with him.   Called [name]- left voice message for Marval to call back   TOC will continue to follow and assist with discharge planning.   Montie Louder, MSW, LCSW Clinical Social Worker       Expected Discharge Plan: Skilled Nursing Facility Barriers to Discharge: Insurance Authorization, SNF Pending bed offer (HD Clinic)               Expected Discharge Plan and Services                                               Social Drivers of Health (SDOH) Interventions SDOH Screenings   Food Insecurity: No Food Insecurity (10/25/2023)  Housing: Low Risk  (10/25/2023)  Transportation Needs: No Transportation Needs (10/25/2023)  Utilities: Not At Risk (10/25/2023)  Financial Resource Strain: Low Risk  (11/05/2022)   Received from [facility]  Social Connections: Moderately Integrated (10/25/2023)  Tobacco Use: Low Risk  (10/29/2023)    Readmission Risk Interventions    05/05/2023   12:37 PM  Readmission Risk Prevention Plan  Transportation Screening Complete  Medication Review (RN Care Manager) Referral to Pharmacy  HRI or Home Care Consult Complete  SW Recovery Care/Counseling Consult Complete  Palliative  Care Screening Not Applicable  Skilled Nursing Facility Not Applicable

## 2023-11-09 NOTE — Plan of Care (Signed)
  Problem: Education: Goal: Knowledge of General Education information will improve Description: Including pain rating scale, medication(s)/side effects and non-pharmacologic comfort measures Outcome: Progressing   Problem: Health Behavior/Discharge Planning: Goal: Ability to manage health-related needs will improve Outcome: Progressing   Problem: Activity: Goal: Risk for activity intolerance will decrease Outcome: Progressing   Problem: Nutrition: Goal: Adequate nutrition will be maintained Outcome: Progressing   Problem: Coping: Goal: Level of anxiety will decrease Outcome: Progressing   Problem: Skin Integrity: Goal: Risk for impaired skin integrity will decrease Outcome: Progressing   

## 2023-11-09 NOTE — Progress Notes (Signed)
  [facility] Progress Note   Subjective:  Patient seen and examined in his room this morning. Patient was finishing his breakfast this morning when I saw him. He denies any dyspnea or CP. BP acceptable today. He needs updated labs. Possible d/c today or tomorrow to SNF? Next HD 11/10/23  Objective Vitals:   11/08/23 2232 11/09/23 0603 11/09/23 0800 11/09/23 0914  BP: (!) 140/72  126/75 137/84  Pulse: 99  (!) 54 (!) 59  Resp: 16  15 16   Temp: 98 F (36.7 C)  98.1 F (36.7 C) (!) 97.4 F (36.3 C)  TempSrc: Oral  Oral   SpO2: 93%  96% 100%  Weight:  73.3 kg    Height:       Physical Exam General:  alert, lying bed, nad  Heart: RRR  Lungs: Clear bilaterally  Abdomen: non tender  Extremities:no LE edema, R BKA Dialysis Access: LU AVF   Filed Weights   11/08/23 0832 11/08/23 1221 11/09/23 0603  Weight: 72.4 kg 68.8 kg 73.3 kg    Intake/Output Summary (Last 24 hours) at 11/09/2023 1018 Last data filed at 11/08/2023 1221 Gross per 24 hour  Intake --  Output 2500 ml  Net -2500 ml    Additional Objective Labs: Basic Metabolic Panel: Recent Labs  Lab 11/06/23 0811 11/07/23 0348 11/08/23 0344  NA 131* 133* 131*  K 4.5 4.1 4.1  CL 93* 95* 93*  CO2 24 28 27   GLUCOSE 79 87 88  BUN 45* 31* 45*  CREATININE 8.69* 6.80* 8.82*  CALCIUM  9.7 10.0 9.9  PHOS 4.7*  --  4.8*    CBC: Recent Labs  Lab 11/06/23 0811 11/07/23 0348 11/08/23 0344  WBC 9.1 9.4 8.6  HGB 8.9* 10.7* 9.8*  HCT 28.1* 34.0* 30.7*  MCV 87.5 88.1 87.5  PLT 225 271 295    Medications:    apixaban   5 mg Oral BID   aspirin  EC  81 mg Oral Daily   atorvastatin   10 mg Oral Daily   calcitRIOL   1.75 mcg Oral Q M,W,F-1800   calcium  acetate  2,001 mg Oral TID WC   Chlorhexidine  Gluconate Cloth  6 each Topical Q0600   cinacalcet   30 mg Oral Q M,W,F-1800   ferrous sulfate   325 mg Oral Q breakfast   gabapentin   100 mg Oral QHS   irbesartan   300 mg Oral Daily   metoprolol  succinate  75 mg  Oral Daily   sodium chloride  flush  3 mL Intravenous Q12H    Dialysis Orders: [facility]  on MWF . EDW 84kg HD Bath 2K/2.5Ca  Time 4:00 Heparin  1000 bolus then 600 units/hr. 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  VVS following.   Left lower extremity cellulitis - s/p IV antibiotics.  ESRD -  HD on MWF. Back on schedule. Next HD 11/10/23. Awaiting updated RFP.  Hypertension/volume  - BP better with UF.  Under EDW now. Will need lower EDW on d/c post BKA.  UF as tolerated. Anemia  - Hgb 9.8.  Received Aranesp  100 on 8/19. Awaiting updated CBC Metabolic bone disease - Calcium /Phos acceptable.  Continue VDRA, sensipar  and binders.  Afib - on Eliquis   Dispo - for SNF placement.   Belvie Och, NP [facility]  11/09/2023,10:18 AM

## 2023-11-10 DIAGNOSIS — I70201 Unspecified atherosclerosis of native arteries of extremities, right leg: Secondary | ICD-10-CM | POA: Diagnosis not present

## 2023-11-10 LAB — BASIC METABOLIC PANEL WITH GFR
Anion gap: 13 (ref 5–15)
BUN: 43 mg/dL — ABNORMAL HIGH (ref 8–23)
CO2: 28 mmol/L (ref 22–32)
Calcium: 10.4 mg/dL — ABNORMAL HIGH (ref 8.9–10.3)
Chloride: 89 mmol/L — ABNORMAL LOW (ref 98–111)
Creatinine, Ser: 8.56 mg/dL — ABNORMAL HIGH (ref 0.61–1.24)
GFR, Estimated: 6 mL/min — ABNORMAL LOW (ref >60)
Glucose, Bld: 82 mg/dL (ref 70–99)
Potassium: 4.6 mmol/L (ref 3.5–5.1)
Sodium: 130 mmol/L — ABNORMAL LOW (ref 135–145)

## 2023-11-10 LAB — CBC
HCT: 30.9 % — ABNORMAL LOW (ref 39.0–52.0)
Hemoglobin: 9.7 g/dL — ABNORMAL LOW (ref 13.0–17.0)
MCH: 27.8 pg (ref 26.0–34.0)
MCHC: 31.4 g/dL (ref 30.0–36.0)
MCV: 88.5 fL (ref 80.0–100.0)
Platelets: 290 K/uL (ref 150–400)
RBC: 3.49 MIL/uL — ABNORMAL LOW (ref 4.22–5.81)
RDW: 17.2 % — ABNORMAL HIGH (ref 11.5–15.5)
WBC: 8.8 K/uL (ref 4.0–10.5)
nRBC: 0 % (ref 0.0–0.2)

## 2023-11-10 MED ORDER — GABAPENTIN 100 MG PO CAPS
200.0000 mg | ORAL_CAPSULE | Freq: Every day | ORAL | Status: DC
  Administered 2023-11-10 – 2023-11-13 (×3): 200 mg via ORAL
  Filled 2023-11-10 (×5): qty 2

## 2023-11-10 NOTE — TOC Progression Note (Signed)
 Transition of Care [facility]) - Progression Note    Patient Details  Name: Jacob Daniel MRN: [id number] Date of Birth: [birth year]  Transition of Care [facility]) CM/SW Contact  Montie LOISE Louder, [place] Phone Number: 11/10/2023, 12:42 PM  Clinical Narrative:     Sent message to Tammy, to follow up on bed availability for [name]- waiting on response     Expected Discharge Plan: Skilled Nursing Facility Barriers to Discharge: English as a second language teacher, SNF Pending bed offer (HD Clinic)               Expected Discharge Plan and Services                                               Social Drivers of Health (SDOH) Interventions SDOH Screenings   Food Insecurity: No Food Insecurity (10/25/2023)  Housing: Low Risk  (10/25/2023)  Transportation Needs: No Transportation Needs (10/25/2023)  Utilities: Not At Risk (10/25/2023)  Financial Resource Strain: Low Risk  (11/05/2022)   Received from [facility]  Social Connections: Moderately Integrated (10/25/2023)  Tobacco Use: Low Risk  (10/29/2023)    Readmission Risk Interventions    05/05/2023   12:37 PM  Readmission Risk Prevention Plan  Transportation Screening Complete  Medication Review (RN Care Manager) Referral to Pharmacy  HRI or Home Care Consult Complete  SW Recovery Care/Counseling Consult Complete  Palliative Care Screening Not Applicable  Skilled Nursing Facility Not Applicable

## 2023-11-10 NOTE — Consult Note (Signed)
 WOC Nurse Consult Note: Reason for Consult: Spider bite to left posterior lower leg a week ago.  He has been treated with Doxycycline .  Vascular team will manage in the outpatient setting.  Critical limb ischemia and patient has pain with assessment of bilateral legs.   Left medial heel with nonblanchable erythema , warm to touch.  Consistent with deep tissue injury Right BKA with intact staple line and discoloration to periwound skin noted.  Vascular team aware.  Wound type:Trauma, pressure (heel)  and vascular Pressure Injury POA: No Measurement: Left posterior leg:  6 cm x 4 cm with dry intact eschar to wound bed. LEft medial heel:  2 cm x 2 cm darkened, nonblanchable area, tender to touch Right BKA site with intact staple line and warm discoloration to periwound . Wound bed: Eschar Drainage (amount, consistency, odor) Staple line with minimal serosanguinous noted on bed linen.  No odor Periwound: Dry skin, discoloration to periwound at staple line, right BKA.  Dressing procedure/placement/frequency: Cleanse left posterior leg wound with NS and pat dry. Apply Xeroform to wound bed and cover with foam dressing.  Change every other day.  Date dressing.  Left medial heel:  Off load pressure.  Apply PRevalon boot.  Right staple line:  Keep compression garment over stump.  If draining excessively, may apply dry dressing and kerlix/tape.  Will not follow at this time.  Please re-consult if needed.  Darice Cooley MSN, RN, FNP-BC CWON Wound, Ostomy, Continence Nurse Outpatient [facility] [phone] Work cell phone:  [phone]

## 2023-11-10 NOTE — Progress Notes (Signed)
  [facility] Progress Note   Subjective:  Seen in room. Does endorse pain in left calf this am. For dialysis today.  SW/CM working on placement. Per notes has been denied from all outpatient [facility] in [place].   Objective Vitals:   11/09/23 1243 11/09/23 2033 11/10/23 0527 11/10/23 0821  BP: 137/82 (!) 160/88 (!) 149/93 (!) 179/103  Pulse: 60 75 (!) 57 (!) 34  Resp:  20 15 18   Temp: 98.6 F (37 C) (!) 97.4 F (36.3 C) 98.3 F (36.8 C) 98.1 F (36.7 C)  TempSrc: Oral Oral Oral Oral  SpO2: 100% 98% 99% 94%  Weight:      Height:       Physical Exam General:  alert, lying bed, nad  Heart: RRR  Lungs: Clear bilaterally  Abdomen: non tender  Extremities:no LE edema, R BKA Dialysis Access: LU AVF   Filed Weights   11/08/23 0832 11/08/23 1221 11/09/23 0603  Weight: 72.4 kg 68.8 kg 73.3 kg    Intake/Output Summary (Last 24 hours) at 11/10/2023 1008 Last data filed at 11/10/2023 0100 Gross per 24 hour  Intake 600 ml  Output --  Net 600 ml    Additional Objective Labs: Basic Metabolic Panel: Recent Labs  Lab 11/06/23 0811 11/07/23 0348 11/08/23 0344 11/09/23 1124 11/10/23 0426  NA 131*   < > 131* 128* 130*  K 4.5   < > 4.1 4.4 4.6  CL 93*   < > 93* 90* 89*  CO2 24   < > 27 25 28   GLUCOSE 79   < > 88 90 82  BUN 45*   < > 45* 35* 43*  CREATININE 8.69*   < > 8.82* 7.04* 8.56*  CALCIUM  9.7   < > 9.9 9.4 10.4*  PHOS 4.7*  --  4.8* 3.7  --    < > = values in this interval not displayed.    CBC: Recent Labs  Lab 11/06/23 0811 11/07/23 0348 11/08/23 0344 11/09/23 1124 11/10/23 0426  WBC 9.1 9.4 8.6 9.7 8.8  HGB 8.9* 10.7* 9.8* 10.4* 9.7*  HCT 28.1* 34.0* 30.7* 32.1* 30.9*  MCV 87.5 88.1 87.5 86.5 88.5  PLT 225 271 295 338 290    Medications:  anticoagulant sodium citrate      anticoagulant sodium citrate        apixaban   5 mg Oral BID   aspirin  EC  81 mg Oral Daily   atorvastatin   10 mg Oral Daily   calcitRIOL   1.75 mcg  Oral Q M,W,F-1800   calcium  acetate  2,001 mg Oral TID WC   Chlorhexidine  Gluconate Cloth  6 each Topical Q0600   cinacalcet   30 mg Oral Q M,W,F-1800   ferrous sulfate   325 mg Oral Q breakfast   gabapentin   100 mg Oral QHS   irbesartan   300 mg Oral Daily   metoprolol  succinate  75 mg Oral Daily   sodium chloride  flush  3 mL Intravenous Q12H    Dialysis Orders: [facility]  on MWF . EDW 84kg HD Bath 2K/2.5Ca  Time 4:00 Heparin  1000 bolus then 600 units/hr. 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  Left lower extremity cellulitis - s/p IV antibiotics.  ESRD -  HD on MWF. Back on  schedule. HD today.  Hypertension/volume  - BP better with UF.  Under EDW now. Will need lower EDW on d/c post BKA.  UF as tolerated. Anemia  - Hgb 9.8.  Received Aranesp  100 on 8/19.  Metabolic bone disease - Calcium /Phos acceptable.  Continue VDRA, sensipar  and binders.  Afib - on Eliquis   Dispo - pending. Appears difficult to find placement. CM/SW involved.   Maisie Ronnald Acosta PA-C [facility] 11/10/2023,10:11 AM

## 2023-11-10 NOTE — Progress Notes (Signed)
 Progress Note   Patient: Jacob Daniel FMW:[id number] DOB: [birth year] DOA: 10/25/2023     16 DOS: the patient was seen and examined on 11/10/2023   Brief hospital course: 68yo with h/o ESRD on MWF HD, failed renal transplant, HTN, HLD, PAF, chronic HFrEF, CAD, and OSA who presented on 8/11 with critical limb ischemia, failed outpatient antibiotic therapy.  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  Completed antibiotics.  His primary issue now is disposition, as he has been declined by all 5 [facility] HD centers in [place] due to prior behavior.  He may need SNF placement outside of [facility] to accommodate his HD.  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, nonopacification of the trifurcation vessels below the knee, with small toe ulceration ABI indicated mild right lower extremity arterial disease may be falsely elevated due to arterial wall calcification right TBI is absent left ABI indicates noncompressible arteries left TBI absent  Aortogram on 8/13 R BKA on 8/15 Having ongoing pain - will increase gabapentin  to 200 mg at bedtime Continue oxy  LLE cellulitis Non-healing wound on left calf -believed to be spider bite  Completed antibiotics    ESRD on HD MWF Failed renal transplant - no longer taking tacrolimus  Continue MWF HD Cont home PhosLo , ferrous sulfate  He has had reported behavioral issues at HD at multiple sites (he reports that he likes the girls) and so appears to be unable to be placed in [place] since none of the [employer] sites will accept him He was attending HD in [place] and so hopefully can return there   HTN BP well-controlled on metoprolol  and Avapro    HLD Continue Lipitor   PAF Rate controlled with metoprolol  Resumed on Eliquis    Chronic systolic CHF/pulmonary hypertension/heart failure Euvolemic, continue to address fluid status with dialysis   CAD No chest pain   OSA Did not  tolerate so not using for few years  Disposition [facility] in [place] is willing to accept the patient and it appears that he can go on 8/29 Will need his HD facility to agree that he can return Will need insurance authorization PT/OT reassessments are pending and patient is strongly encouraged to participate    Consultants: Vascular surgery Nephrology Orthopedic surgery OT PT TOC team  Procedures: ABI 8/12 Abdominal aortogram 8/13 R BKA 8/15  Antibiotics: Zosyn  x 1 Vancomycin  8/11-18  30 Day Unplanned Readmission Risk Score    Flowsheet Row ED to Hosp-Admission (Current) from 10/25/2023 in [facility] GENERAL MED/SURG UNIT  30 Day Unplanned Readmission Risk Score (%) 44.07 Filed at 11/10/2023 0801    This score is the patient's risk of an unplanned readmission within 30 days of being discharged (0 -100%). The score is based on dignosis, age, lab data, medications, orders, and past utilization.   Low:  0-14.9   Medium: 15-21.9   High: 22-29.9   Extreme: 30 and above           Subjective: Reports having pain in BLE.  He understands the challenge with his behaviors as it pertains to HD.   Objective: Vitals:   11/10/23 1700 11/10/23 1732  BP: 131/82 111/76  Pulse:  64  Resp: 10 (!) 21  Temp:    SpO2: 98% 100%    Intake/Output Summary (Last 24 hours) at 11/10/2023 1817 Last data filed at 11/10/2023 0100 Gross per 24 hour  Intake 600 ml  Output --  Net 600 ml  Filed Weights   11/08/23 1221 11/09/23 0603 11/10/23 1315  Weight: 68.8 kg 73.3 kg 74 kg    Exam:  General:  Appears calm and comfortable and is in NAD Eyes:   normal lids, iris ENT:  grossly normal hearing, lips & tongue, mmm Cardiovascular:  RRR. No LE edema.  Respiratory:   CTA bilaterally with no wheezes/rales/rhonchi.  Normal respiratory effort. Abdomen:  soft, NT, ND Skin:  no rash or induration seen on limited exam Musculoskeletal:  s/p R BKA, sleeve in place; L posterior  lower leg is in place Psychiatric:  grossly normal mood and affect, speech fluent and appropriate, AOx3 Neurologic:  CN 2-12 grossly intact, moves all extremities in coordinated fashion  Data Reviewed: I have reviewed the patient's lab results since admission.  Pertinent labs for today include:   Na++ 130, stable BUN 43/Creatinine 8.56/GFR 6 WBC 8.8 Hgb 9.7     Family Communication: None present  Disposition: Status is: Inpatient Remains inpatient appropriate because: needs placement     Time spent: 50 minutes  Unresulted Labs (From admission, onward)    None        Author: Delon Herald, MD 11/10/2023 6:17 PM  For on call review [email or web address].

## 2023-11-10 NOTE — TOC Progression Note (Addendum)
 Transition of Care [facility]) - Progression Note    Patient Details  Name: Jacob Daniel MRN: [id number] Date of Birth: [birth year]  Transition of Care [facility]) CM/SW Contact  Montie LOISE Louder, [place] Phone Number: 11/10/2023, 3:33 PM  Clinical Narrative:     [name] informed they will have availability on Friday. Patient will need insurance approval before he can d/c to SNF.   CSW updated MD, CM, Renal Navigator & CSW - patient will need updated insurance note from PT showing his willingness to participate,etc. Before insurance authorization can be started.    TOC will continue to follow and assist with discharge planning.   Montie Louder, MSW, LCSW Clinical Social Worker    Expected Discharge Plan: Skilled Nursing Facility Barriers to Discharge: Insurance Authorization, SNF Pending bed offer (HD Clinic)               Expected Discharge Plan and Services                                               Social Drivers of Health (SDOH) Interventions SDOH Screenings   Food Insecurity: No Food Insecurity (10/25/2023)  Housing: Low Risk  (10/25/2023)  Transportation Needs: No Transportation Needs (10/25/2023)  Utilities: Not At Risk (10/25/2023)  Financial Resource Strain: Low Risk  (11/05/2022)   [facility]  Social Connections: Moderately Integrated (10/25/2023)  Tobacco Use: Low Risk  (10/29/2023)    Readmission Risk Interventions    05/05/2023   12:37 PM  Readmission Risk Prevention Plan  Transportation Screening Complete  Medication Review (RN Care Manager) Referral to Pharmacy  HRI or Home Care Consult Complete  SW Recovery Care/Counseling Consult Complete  Palliative Care Screening Not Applicable  Skilled Nursing Facility Not Applicable

## 2023-11-10 NOTE — Plan of Care (Signed)
  Problem: Education: Goal: Knowledge of General Education information will improve Description: Including pain rating scale, medication(s)/side effects and non-pharmacologic comfort measures Outcome: Progressing   Problem: Health Behavior/Discharge Planning: Goal: Ability to manage health-related needs will improve Outcome: Progressing   Problem: Clinical Measurements: Goal: Ability to maintain clinical measurements within normal limits will improve Outcome: Progressing Goal: Will remain free from infection Outcome: Progressing Goal: Diagnostic test results will improve Outcome: Progressing Goal: Respiratory complications will improve Outcome: Progressing Goal: Cardiovascular complication will be avoided Outcome: Progressing   Problem: Activity: Goal: Risk for activity intolerance will decrease Outcome: Progressing   Problem: Nutrition: Goal: Adequate nutrition will be maintained Outcome: Progressing   Problem: Coping: Goal: Level of anxiety will decrease Outcome: Progressing   Problem: Elimination: Goal: Will not experience complications related to bowel motility Outcome: Progressing Goal: Will not experience complications related to urinary retention Outcome: Progressing   Problem: Pain Managment: Goal: General experience of comfort will improve and/or be controlled Outcome: Progressing   Problem: Safety: Goal: Ability to remain free from injury will improve Outcome: Progressing   Problem: Skin Integrity: Goal: Risk for impaired skin integrity will decrease Outcome: Progressing   Problem: Education: Goal: Ability to demonstrate management of disease process will improve Outcome: Progressing Goal: Ability to verbalize understanding of medication therapies will improve Outcome: Progressing   Problem: Activity: Goal: Capacity to carry out activities will improve Outcome: Progressing   Problem: Cardiac: Goal: Ability to achieve and maintain adequate  cardiopulmonary perfusion will improve Outcome: Progressing   Problem: Education: Goal: Knowledge of disease and its progression will improve Outcome: Progressing   Problem: Fluid Volume: Goal: Compliance with measures to maintain balanced fluid volume will improve Outcome: Progressing   Problem: Health Behavior/Discharge Planning: Goal: Ability to manage health-related needs will improve Outcome: Progressing   Problem: Nutritional: Goal: Ability to make healthy dietary choices will improve Outcome: Progressing   Problem: Clinical Measurements: Goal: Complications related to the disease process, condition or treatment will be avoided or minimized Outcome: Progressing   Problem: Education: Goal: Understanding of CV disease, CV risk reduction, and recovery process will improve Outcome: Progressing Goal: Individualized Educational Video(s) Outcome: Progressing   Problem: Activity: Goal: Ability to return to baseline activity level will improve Outcome: Progressing   Problem: Cardiovascular: Goal: Ability to achieve and maintain adequate cardiovascular perfusion will improve Outcome: Progressing Goal: Vascular access site(s) Level 0-1 will be maintained Outcome: Progressing   Problem: Health Behavior/Discharge Planning: Goal: Ability to safely manage health-related needs after discharge will improve Outcome: Progressing

## 2023-11-10 NOTE — Progress Notes (Signed)
  Received patient in bed to unit.   Informed consent signed and in chart.    TX duration:3.5     Transported by  Hand-off given to patient's nurse.    Access used: left fistula  Access issues: none   Total UF removed: 2900 Medication(s) given:  none Post HD VS: WNL Post HD weight:      Hunter Hacking LPN [facility] D[facility]

## 2023-11-10 NOTE — Progress Notes (Signed)
 Occupational Therapy Treatment Patient Details Name: Jacob Daniel MRN: [id number] DOB: 1955/07/17 Today's Date: 11/10/2023   History of present illness Pt is 68 year old presented to [facility] on  10/[facility]/23 for rt foot pain and lt calf ulceration (spider bite per pt). Underwent RLE angiogram with unsuccessful attempted recanalization of the rt posterior tibial artery on 8/13. 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 PMH - CAD, ESRD on HD, HTN, HLD, A-Fib, heart failure, pulmonary hypertension,  chronic hypokalemia, OSA, failed renal transplant   OT comments  Patient received in supine and agreeable to OT session. Patient able to get to EOB with increased time and min assist. Patient performed grooming seated on EOB and declined further self care tasks. Patient declined attempting to stand to address transfers due to RLE pain and stated he was waiting to go to HD and did not want OOB today. Patient willing to perform lateral scooting towards [facility] with min assist and use of bed pads. Patient was able to return to supine with increased time.  Patient will benefit from continued inpatient follow up therapy, <3 hours/day. Acute OT to continue to follow to address established goals to facilitate DC to next venue of care.        If plan is discharge home, recommend the following:  Two people to help with walking and/or transfers;Two people to help with bathing/dressing/bathroom;Assistance with cooking/housework;Direct supervision/assist for medications management;Direct supervision/assist for financial management;Assist for transportation;Help with stairs or ramp for entrance;Supervision due to cognitive status   Equipment Recommendations  Wheelchair (measurements OT);Wheelchair cushion (measurements OT)    Recommendations for Other Services      Precautions / Restrictions Precautions Precautions: Fall Recall of Precautions/Restrictions: Impaired Precaution/Restrictions Comments: cues to avoid WB through residual  limb during bed mobility Restrictions Weight Bearing Restrictions Per Provider Order: Yes RLE Weight Bearing Per Provider Order: Non weight bearing Other Position/Activity Restrictions: reminders needed       Mobility Bed Mobility Overal bed mobility: Needs Assistance Bed Mobility: Supine to Sit, Sit to Supine     Supine to sit: Min assist Sit to supine: Supervision   General bed mobility comments: increased time and assistance with scooting towards EOB. able to returnt to supine with increased time    Transfers Overall transfer level: Needs assistance Equipment used: None              Lateral/Scoot Transfers: Min assist General transfer comment: patient declined attempting to stand to address transfers and performed lateral scooting with use of bed pad and min assist     Balance Overall balance assessment: Needs assistance Sitting-balance support: Feet supported, Bilateral upper extremity supported Sitting balance-Leahy Scale: Fair Sitting balance - Comments: EOB                                   ADL either performed or assessed with clinical judgement   ADL Overall ADL's : Needs assistance/impaired     Grooming: Wash/dry hands;Wash/dry face;Oral care;Set up;Sitting Grooming Details (indicate cue type and reason): EOB             Lower Body Dressing: Moderate assistance;Maximal assistance Lower Body Dressing Details (indicate cue type and reason): LLE sock                    Extremity/Trunk Assessment              Vision       Perception  Praxis     Communication Communication Communication: No apparent difficulties   Cognition Arousal: Alert Behavior During Therapy: Flat affect Cognition: No family/caregiver present to determine baseline, Cognition impaired     Awareness: Online awareness impaired Memory impairment (select all impairments): Short-term memory Attention impairment (select first level of  impairment): Alternating attention Executive functioning impairment (select all impairments): Problem solving OT - Cognition Comments: cooperative this session but self limiting                 Following commands: Impaired Following commands impaired: Only follows one step commands consistently, Follows one step commands inconsistently      Cueing   Cueing Techniques: Verbal cues, Tactile cues, Gestural cues  Exercises      Shoulder Instructions       General Comments      Pertinent Vitals/ Pain       Pain Assessment Pain Assessment: Faces Faces Pain Scale: Hurts little more Pain Location: residual limb and LLE calf Pain Descriptors / Indicators: Grimacing, Guarding, Shooting, Throbbing, Operative site guarding Pain Intervention(s): Limited activity within patient's tolerance, Monitored during session, Repositioned  Home Living                                          Prior Functioning/Environment              Frequency  Min 2X/week        Progress Toward Goals  OT Goals(current goals can now be found in the care plan section)  Progress towards OT goals: Progressing toward goals  Acute Rehab OT Goals Patient Stated Goal: to go home OT Goal Formulation: With patient Time For Goal Achievement: 11/15/23 Potential to Achieve Goals: Fair ADL Goals Pt Will Perform Upper Body Bathing: with modified independence;sitting Pt Will Perform Lower Body Bathing: with min assist;sitting/lateral leans Pt Will Perform Upper Body Dressing: with modified independence Pt Will Perform Lower Body Dressing: with min assist;sitting/lateral leans Pt Will Transfer to Toilet: with mod assist;squat pivot transfer;bedside commode Pt Will Perform Toileting - Clothing Manipulation and hygiene: with max assist  Plan      Co-evaluation                 AM-PAC OT 6 Clicks Daily Activity     Outcome Measure   Help from another person eating meals?:  None Help from another person taking care of personal grooming?: A Little Help from another person toileting, which includes using toliet, bedpan, or urinal?: A Lot Help from another person bathing (including washing, rinsing, drying)?: A Lot Help from another person to put on and taking off regular upper body clothing?: A Little Help from another person to put on and taking off regular lower body clothing?: A Lot 6 Click Score: 16    End of Session    OT Visit Diagnosis: Unsteadiness on feet (R26.81);Other abnormalities of gait and mobility (R26.89);Muscle weakness (generalized) (M62.81);Pain Pain - Right/Left: Right Pain - part of body: Leg   Activity Tolerance Patient tolerated treatment well   Patient Left in bed;with call bell/phone within reach;with bed alarm set   Nurse Communication Mobility status        Time: 1011-1040 OT Time Calculation (min): 29 min  Charges: OT General Charges $OT Visit: 1 Visit OT Treatments $Self Care/Home Management : 8-22 mins $Therapeutic Activity: 8-22 mins  Dick Laine, OTA Acute Rehabilitation Services  Office [phone]   Jeb LITTIE Laine 11/10/2023, 12:25 PM

## 2023-11-11 DIAGNOSIS — I70201 Unspecified atherosclerosis of native arteries of extremities, right leg: Secondary | ICD-10-CM | POA: Diagnosis not present

## 2023-11-11 LAB — GLUCOSE, CAPILLARY: Glucose-Capillary: 118 mg/dL — ABNORMAL HIGH (ref 70–99)

## 2023-11-11 MED ORDER — CALCIUM ACETATE (PHOS BINDER) 667 MG PO CAPS
1334.0000 mg | ORAL_CAPSULE | Freq: Three times a day (TID) | ORAL | Status: DC
  Administered 2023-11-11 – 2023-11-13 (×5): 1334 mg via ORAL
  Filled 2023-11-11 (×8): qty 2

## 2023-11-11 MED ORDER — DARBEPOETIN ALFA 100 MCG/0.5ML IJ SOSY: 100.0000 ug | PREFILLED_SYRINGE | INTRAMUSCULAR | Status: DC

## 2023-11-11 NOTE — Progress Notes (Signed)
 Occupational Therapy Treatment Patient Details Name: Jacob Daniel MRN: [id number] DOB: 1955/11/21 Today's Date: 11/11/2023   History of present illness Pt is 68 year old presented to [facility] on  10/25/23 for rt foot pain and lt calf ulceration (spider bite per pt). Underwent RLE angiogram with unsuccessful attempted recanalization of the rt posterior tibial artery on 8/13. 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 PMH - CAD, ESRD on HD, HTN, HLD, A-Fib, heart failure, pulmonary hypertension,  chronic hypokalemia, OSA, failed renal transplant   OT comments  Pt progressing towards goals. Completed bed mobility with s for safety and cues for sequencing. Pt initally agreeable to standing trials, but not following cues for anterior lean or positioning of L foot and BUEs. Despite max cues and max assist pt unable to clear bottom from bed, suspected self limiting, pt stating  I can not do it and I will not do it. OT educated pt on importance of continuing to improve functional mobility with therapy to make progress towards pt's long term goals. Continue to recommend <3 hours of skilled rehab daily to optimize independence levels. Will continue to follow acutely.       If plan is discharge home, recommend the following:  Two people to help with walking and/or transfers;Two people to help with bathing/dressing/bathroom;Assistance with cooking/housework;Direct supervision/assist for medications management;Direct supervision/assist for financial management;Assist for transportation;Help with stairs or ramp for entrance;Supervision due to cognitive status   Equipment Recommendations  Wheelchair (measurements OT);Wheelchair cushion (measurements OT)    Recommendations for Other Services      Precautions / Restrictions Precautions Precautions: Fall Recall of Precautions/Restrictions: Impaired Precaution/Restrictions Comments: cues to avoid WB through residual limb during bed mobility Restrictions Weight Bearing  Restrictions Per Provider Order: Yes RLE Weight Bearing Per Provider Order: Non weight bearing       Mobility Bed Mobility Overal bed mobility: Needs Assistance Bed Mobility: Supine to Sit, Sit to Supine     Supine to sit: Supervision Sit to supine: Supervision   General bed mobility comments: Increased time, effort, cueing for sequencing    Transfers Overall transfer level: Needs assistance Equipment used: Rolling walker (2 wheels) Transfers: Sit to/from Stand Sit to Stand: Max assist, From elevated surface           General transfer comment: x2 standing trials attempted with max assist. Pt not following cues to facilitate anterior lean and likely self limiting performance. Unable to clear bottom     Balance Overall balance assessment: Needs assistance Sitting-balance support: Feet supported, Bilateral upper extremity supported Sitting balance-Leahy Scale: Fair Sitting balance - Comments: At times posterior lean but able to self-correct         ADL either performed or assessed with clinical judgement   ADL Overall ADL's : Needs assistance/impaired Eating/Feeding: Set up;Sitting       Toileting- Clothing Manipulation and Hygiene: Total assistance;Bed level         General ADL Comments: Session limited d/t lack of drop arm recliner, and pt declining further transfers after STS attempts    Extremity/Trunk Assessment Upper Extremity Assessment Upper Extremity Assessment: Overall WFL for tasks assessed   Lower Extremity Assessment Lower Extremity Assessment: Defer to PT evaluation        Vision   Vision Assessment?: No apparent visual deficits         Communication Communication Communication: No apparent difficulties   Cognition Arousal: Alert Behavior During Therapy: Flat affect Cognition: No family/caregiver present to determine baseline, Cognition impaired   Orientation impairments:  Time Awareness: Online awareness impaired Memory impairment  (select all impairments): Short-term memory Attention impairment (select first level of impairment): Alternating attention Executive functioning impairment (select all impairments): Problem solving OT - Cognition Comments: cooperative but self limiting. Tangiental regarding lack of family prescence in room       Following commands: Impaired Following commands impaired: Only follows one step commands consistently, Follows one step commands inconsistently      Cueing   Cueing Techniques: Verbal cues, Tactile cues, Gestural cues        General Comments RN present during session provided meds    Pertinent Vitals/ Pain       Pain Assessment Pain Assessment: Faces Faces Pain Scale: Hurts a little bit Pain Location: residual limb and LLE calf Pain Descriptors / Indicators: Grimacing, Guarding, Shooting, Throbbing, Operative site guarding Pain Intervention(s): Monitored during session   Frequency  Min 2X/week        Progress Toward Goals  OT Goals(current goals can now be found in the care plan section)  Progress towards OT goals: Progressing toward goals  Acute Rehab OT Goals Patient Stated Goal: To rest OT Goal Formulation: With patient Time For Goal Achievement: 11/15/23 Potential to Achieve Goals: Fair ADL Goals Pt Will Perform Upper Body Bathing: with modified independence;sitting Pt Will Perform Lower Body Bathing: with min assist;sitting/lateral leans Pt Will Perform Upper Body Dressing: with modified independence Pt Will Perform Lower Body Dressing: with min assist;sitting/lateral leans Pt Will Transfer to Toilet: with mod assist;squat pivot transfer;bedside commode Pt Will Perform Toileting - Clothing Manipulation and hygiene: with max assist  Plan         AM-PAC OT 6 Clicks Daily Activity     Outcome Measure   Help from another person eating meals?: None Help from another person taking care of personal grooming?: A Little Help from another person  toileting, which includes using toliet, bedpan, or urinal?: Total Help from another person bathing (including washing, rinsing, drying)?: A Lot Help from another person to put on and taking off regular upper body clothing?: A Little Help from another person to put on and taking off regular lower body clothing?: A Lot 6 Click Score: 15    End of Session Equipment Utilized During Treatment: Gait belt;Rolling walker (2 wheels)  OT Visit Diagnosis: Unsteadiness on feet (R26.81);Other abnormalities of gait and mobility (R26.89);Muscle weakness (generalized) (M62.81);Pain Pain - Right/Left: Right Pain - part of body: Leg   Activity Tolerance Patient limited by pain   Patient Left in bed;with call bell/phone within reach;with bed alarm set   Nurse Communication Mobility status        Time: 9097-9080 OT Time Calculation (min): 17 min  Charges: OT General Charges $OT Visit: 1 Visit OT Treatments $Self Care/Home Management : 8-22 mins  Adrianne BROCKS, OT  Acute Rehabilitation Services Office [phone] Secure chat preferred   Adrianne GORMAN Savers 11/11/2023, 10:30 AM

## 2023-11-11 NOTE — Progress Notes (Addendum)
 [facility] Progress Note   Subjective:  Seen in room. No issues with dialysis yesterday. No new events. Discharge planning ongoing.    Objective Vitals:   11/10/23 1700 11/10/23 1732 11/10/23 2056 11/11/23 0756  BP: 131/82 111/76 107/67 (!) 138/90  Pulse:  64 74 62  Resp: 10 (!) 21 20   Temp:   98.6 F (37 C) 98 F (36.7 C)  TempSrc:   Oral Oral  SpO2: 98% 100% 94% 97%  Weight:      Height:       Physical Exam General:  alert, lying bed, nad  Heart: RRR  Lungs: Clear bilaterally  Abdomen: non tender  Extremities:no LE edema, R BKA Dialysis Access: LU AVF   Filed Weights   11/08/23 1221 11/09/23 0603 11/10/23 1315  Weight: 68.8 kg 73.3 kg 74 kg   No intake or output data in the 24 hours ending 11/11/23 1004   Additional Objective Labs: Basic Metabolic Panel: Recent Labs  Lab 11/06/23 0811 11/07/23 0348 11/08/23 0344 11/09/23 1124 11/10/23 0426  NA 131*   < > 131* 128* 130*  K 4.5   < > 4.1 4.4 4.6  CL 93*   < > 93* 90* 89*  CO2 24   < > 27 25 28   GLUCOSE 79   < > 88 90 82  BUN 45*   < > 45* 35* 43*  CREATININE 8.69*   < > 8.82* 7.04* 8.56*  CALCIUM  9.7   < > 9.9 9.4 10.4*  PHOS 4.7*  --  4.8* 3.7  --    < > = values in this interval not displayed.    CBC: Recent Labs  Lab 11/06/23 0811 11/07/23 0348 11/08/23 0344 11/09/23 1124 11/10/23 0426  WBC 9.1 9.4 8.6 9.7 8.8  HGB 8.9* 10.7* 9.8* 10.4* 9.7*  HCT 28.1* 34.0* 30.7* 32.1* 30.9*  MCV 87.5 88.1 87.5 86.5 88.5  PLT 225 271 295 338 290    Medications:     apixaban   5 mg Oral BID   aspirin  EC  81 mg Oral Daily   atorvastatin   10 mg Oral Daily   calcitRIOL   1.75 mcg Oral Q M,W,F-1800   calcium  acetate  2,001 mg Oral TID WC   Chlorhexidine  Gluconate Cloth  6 each Topical Q0600   cinacalcet   30 mg Oral Q M,W,F-1800   ferrous sulfate   325 mg Oral Q breakfast   gabapentin   200 mg Oral QHS   irbesartan   300 mg Oral Daily   metoprolol  succinate  75 mg Oral Daily   sodium  chloride flush  3 mL Intravenous Q12H    Dialysis Orders: [facility]  on MWF . EDW 84kg HD Bath 2K/2.5Ca  Time 4:00 Heparin  1000 bolus then 600 units/hr. 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  Left lower extremity cellulitis - s/p IV antibiotics.  ESRD -  HD on MWF. Needs new outpatient center due to SNF. Renal navigator aware.  Hypertension/volume  - BP better with UF.  Under EDW now. Will need lower EDW on d/c post BKA.  UF as tolerated. Anemia  - Hgb 9.8.  Continue Aranesp  100 q week.  Metabolic bone disease - Phos in range. Corr Ca elevated. Hold calcitriol , decrease Phoslo   binder to 2 q meals. Follow.  Afib - on Eliquis   Dispo - pending SNF placement and outpatient dialysis placement. CM/SW involved.   Maisie Ronnald Acosta PA-C [facility] 11/11/2023,10:04 AM

## 2023-11-11 NOTE — Progress Notes (Signed)
 Progress Note   Patient: Jacob Daniel FMW:[id number] DOB: November 21, 1955 DOA: 10/25/2023     17 DOS: the patient was seen and examined on 11/11/2023   Brief hospital course: 68yo with h/o ESRD on MWF HD, failed renal transplant, HTN, HLD, PAF, chronic HFrEF, CAD, and OSA who presented on 8/11 with critical limb ischemia, failed outpatient antibiotic therapy.  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  Completed antibiotics.  His primary issue now is disposition, as he has been declined by all 5 [facility] HD centers in [place] due to prior behavior.  He may need SNF placement outside of [facility] to accommodate his HD.  Assessment and Plan:  Critical limb ischemia of RLE Thrombosis of the popliteal artery from above the knee, nonopacification of the trifurcation vessels below the knee, with small toe ulceration ABI indicated mild right lower extremity arterial disease may be falsely elevated due to arterial wall calcification right TBI is absent left ABI indicates noncompressible arteries left TBI absent  Aortogram on 8/13 R BKA on 8/15 Having ongoing pain - increased gabapentin  to 200 mg at bedtime Continue oxy   LLE cellulitis Non-healing wound on left calf -believed to be spider bite  Completed antibiotics  Wound care   ESRD on HD MWF Failed renal transplant - no longer taking tacrolimus  Continue MWF HD Cont home PhosLo , ferrous sulfate  He has had reported behavioral issues at HD at multiple sites (he reports that he likes the girls) and so appears to be unable to be placed in [place] since none of the [facility] sites will accept him He was attending HD in [place] and plan is to return there   HTN BP well-controlled on metoprolol  and Avapro    HLD Continue Lipitor   PAF Rate controlled with metoprolol  Resumed on Eliquis    Chronic systolic CHF/pulmonary hypertension/heart failure Euvolemic, continue to address fluid status with dialysis   CAD No chest pain   OSA Did not  tolerate so not using for few years   Disposition [facility] in [place] is willing to accept the patient and it appears that he can go on 8/29 Will need his HD facility to agree that he can return Will need insurance authorization PT/OT reassessments are pending and patient is strongly encouraged to participate       Consultants: Vascular surgery Nephrology Orthopedic surgery OT PT TOC team   Procedures: ABI 8/12 Abdominal aortogram 8/13 R BKA 8/15   Antibiotics: Zosyn  x 1 Vancomycin  8/11-18    30 Day Unplanned Readmission Risk Score    Flowsheet Row ED to Hosp-Admission (Current) from 10/25/2023 in [facility] GENERAL MED/SURG UNIT  30 Day Unplanned Readmission Risk Score (%) 39.84 Filed at 11/11/2023 0401    This score is the patient's risk of an unplanned readmission within 30 days of being discharged (0 -100%). The score is based on dignosis, age, lab data, medications, orders, and past utilization.   Low:  0-14.9   Medium: 15-21.9   High: 22-29.9   Extreme: 30 and above           Subjective: Continuing to have BLE pain.  Otherwise no new complaints.   Objective: Vitals:   11/10/23 1732 11/10/23 2056  BP: 111/76 107/67  Pulse: 64 74  Resp: (!) 21 20  Temp:  98.6 F (37 C)  SpO2: 100% 94%   No intake or output data in the 24 hours ending 11/11/23 0735 Filed Weights   11/08/23 1221 11/09/23 0603 11/10/23 1315  Weight: 68.8 kg  73.3 kg 74 kg    Exam:  General:  Appears calm and comfortable and is in NAD Eyes:   normal lids, iris ENT:  grossly normal hearing, lips & tongue, mmm Cardiovascular:  RRR. No LE edema.  Respiratory:   CTA bilaterally with no wheezes/rales/rhonchi.  Normal respiratory effort. Abdomen:  soft, NT, ND Skin:  no rash or induration seen on limited exam Musculoskeletal:  s/p R BKA, sleeve in place; L posterior lower leg dressing is in place Psychiatric:  grossly normal mood and affect, speech fluent and appropriate,  AOx3 Neurologic:  CN 2-12 grossly intact, moves all extremities in coordinated fashion  Data Reviewed: I have reviewed the patient's lab results since admission.  Pertinent labs for today include:   Na++ 130, stable BUN 43/Creatinine 8.56/GFR 6 WBC 8.8 Hgb 9.7     Family Communication: None present  Disposition: Status is: Inpatient Remains inpatient appropriate because: placement on 8/29     Time spent: 35 minutes  Unresulted Labs (From admission, onward)    None        Author: Delon Herald, MD 11/11/2023 7:35 AM  For on call review [email or web address].

## 2023-11-11 NOTE — Plan of Care (Signed)
  Problem: Education: Goal: Knowledge of General Education information will improve Description: Including pain rating scale, medication(s)/side effects and non-pharmacologic comfort measures Outcome: Progressing   Problem: Health Behavior/Discharge Planning: Goal: Ability to manage health-related needs will improve Outcome: Progressing   Problem: Clinical Measurements: Goal: Ability to maintain clinical measurements within normal limits will improve Outcome: Progressing Goal: Will remain free from infection Outcome: Progressing Goal: Diagnostic test results will improve Outcome: Progressing Goal: Respiratory complications will improve Outcome: Progressing Goal: Cardiovascular complication will be avoided Outcome: Progressing   Problem: Activity: Goal: Risk for activity intolerance will decrease Outcome: Progressing   Problem: Nutrition: Goal: Adequate nutrition will be maintained Outcome: Progressing   Problem: Coping: Goal: Level of anxiety will decrease Outcome: Progressing   Problem: Elimination: Goal: Will not experience complications related to bowel motility Outcome: Progressing Goal: Will not experience complications related to urinary retention Outcome: Progressing   Problem: Pain Managment: Goal: General experience of comfort will improve and/or be controlled Outcome: Progressing   Problem: Safety: Goal: Ability to remain free from injury will improve Outcome: Progressing   Problem: Skin Integrity: Goal: Risk for impaired skin integrity will decrease Outcome: Progressing   Problem: Education: Goal: Ability to demonstrate management of disease process will improve Outcome: Progressing Goal: Ability to verbalize understanding of medication therapies will improve Outcome: Progressing   Problem: Activity: Goal: Capacity to carry out activities will improve Outcome: Progressing   Problem: Cardiac: Goal: Ability to achieve and maintain adequate  cardiopulmonary perfusion will improve Outcome: Progressing   Problem: Education: Goal: Knowledge of disease and its progression will improve Outcome: Progressing   Problem: Fluid Volume: Goal: Compliance with measures to maintain balanced fluid volume will improve Outcome: Progressing   Problem: Health Behavior/Discharge Planning: Goal: Ability to manage health-related needs will improve Outcome: Progressing   Problem: Nutritional: Goal: Ability to make healthy dietary choices will improve Outcome: Progressing   Problem: Clinical Measurements: Goal: Complications related to the disease process, condition or treatment will be avoided or minimized Outcome: Progressing   Problem: Education: Goal: Understanding of CV disease, CV risk reduction, and recovery process will improve Outcome: Progressing Goal: Individualized Educational Video(s) Outcome: Progressing   Problem: Activity: Goal: Ability to return to baseline activity level will improve Outcome: Progressing   Problem: Cardiovascular: Goal: Ability to achieve and maintain adequate cardiovascular perfusion will improve Outcome: Progressing Goal: Vascular access site(s) Level 0-1 will be maintained Outcome: Progressing   Problem: Health Behavior/Discharge Planning: Goal: Ability to safely manage health-related needs after discharge will improve Outcome: Progressing

## 2023-11-12 DIAGNOSIS — I70201 Unspecified atherosclerosis of native arteries of extremities, right leg: Secondary | ICD-10-CM | POA: Diagnosis not present

## 2023-11-12 MED ORDER — LIDOCAINE-PRILOCAINE 2.5-2.5 % EX CREA: 1.0000 | TOPICAL_CREAM | CUTANEOUS | Status: DC | PRN

## 2023-11-12 MED ORDER — HEPARIN SODIUM (PORCINE) 1000 UNIT/ML DIALYSIS: 1000.0000 [IU] | INTRAMUSCULAR | Status: DC | PRN

## 2023-11-12 MED ORDER — OXYCODONE HCL 5 MG PO TABS
ORAL_TABLET | ORAL | Status: AC
  Filled 2023-11-12: qty 1

## 2023-11-12 MED ORDER — LIDOCAINE HCL (PF) 1 % IJ SOLN: 5.0000 mL | INTRAMUSCULAR | Status: DC | PRN

## 2023-11-12 MED ORDER — ALTEPLASE 2 MG IJ SOLR: 2.0000 mg | Freq: Once | INTRAMUSCULAR | Status: DC | PRN

## 2023-11-12 MED ORDER — ANTICOAGULANT SODIUM CITRATE 4% (200MG/5ML) IV SOLN: 5.0000 mL | Status: DC | PRN

## 2023-11-12 MED ORDER — HEPARIN SODIUM (PORCINE) 1000 UNIT/ML DIALYSIS: 3000.0000 [IU] | INTRAMUSCULAR | Status: DC | PRN

## 2023-11-12 MED ORDER — PENTAFLUOROPROP-TETRAFLUOROETH EX AERO: 1.0000 | INHALATION_SPRAY | CUTANEOUS | Status: DC | PRN

## 2023-11-12 NOTE — Progress Notes (Signed)
 PT Cancellation Note  Patient Details Name: Jacob Daniel MRN: [id number] DOB: March 12, 1956   Cancelled Treatment:    Reason Eval/Treat Not Completed: Patient at procedure or test/unavailable. Pt remains at HDU. PT will follow up as time allows once pt has returned.   [name] 11/12/2023, 5:11 PM

## 2023-11-12 NOTE — Progress Notes (Signed)
 Progress Note   Patient: Jacob Daniel FMW:[id number] DOB: 03-23-1955 DOA: 10/25/2023     18 DOS: the patient was seen and examined on 11/12/2023   Brief hospital course: 68yo with h/o ESRD on MWF HD, failed renal transplant, HTN, HLD, PAF, chronic HFrEF, CAD, and OSA who presented on 8/11 with critical limb ischemia, failed outpatient antibiotic therapy.  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  Completed antibiotics.  His primary issue now is disposition, as he has been declined by all 5 [facility] HD centers in [place] due to prior behavior.  He appears to be able to return to his HD center in [place] and has been offered a bed at [facility] in [place].  Assessment and Plan:  Critical limb ischemia of RLE Thrombosis of the popliteal artery from above the knee, nonopacification of the trifurcation vessels below the knee, with small toe ulceration ABI indicated mild right lower extremity arterial disease may be falsely elevated due to arterial wall calcification right TBI is absent left ABI indicates noncompressible arteries left TBI absent  Aortogram on 8/13 R BKA on 8/15 Having ongoing pain - increased gabapentin  to 200 mg at bedtime Continue oxy   LLE cellulitis Non-healing wound on left calf -believed to be spider bite  Completed antibiotics  Wound care   ESRD on HD MWF Failed renal transplant - no longer taking tacrolimus  Continue MWF HD Cont home PhosLo , ferrous sulfate  He has had reported behavioral issues at HD at multiple sites (he reports that he likes the girls) and so appears to be unable to be placed in [place] since none of the [facility] sites will accept him He was attending HD in [place] and plan is to return there   HTN BP well-controlled on metoprolol  and Avapro    HLD Continue Lipitor   PAF Rate controlled with metoprolol  Resumed on Eliquis    Chronic systolic CHF/pulmonary hypertension/heart failure Euvolemic, continue to address fluid status  with dialysis   CAD No chest pain   OSA Did not tolerate so not using for few years   Disposition [facility] in [place] is willing to accept the patient and it appears that he can go on 8/29 Will need his HD facility to agree that he can return Will need insurance authorization PT/OT reassessments are pending and patient is strongly encouraged to participate       Consultants: Vascular surgery Nephrology Orthopedic surgery OT PT TOC team   Procedures: ABI 8/12 Abdominal aortogram 8/13 R BKA 8/15   Antibiotics: Zosyn  x 1 Vancomycin  8/11-18    30 Day Unplanned Readmission Risk Score    Flowsheet Row ED to Hosp-Admission (Current) from 10/25/2023 in [facility]  30 Day Unplanned Readmission Risk Score (%) 39.91 Filed at 11/12/2023 1200    This score is the patient's risk of an unplanned readmission within 30 days of being discharged (0 -100%). The score is based on dignosis, age, lab data, medications, orders, and past utilization.   Low:  0-14.9   Medium: 15-21.9   High: 22-29.9   Extreme: 30 and above           Subjective: Awaiting placement.  Pain seems better controlled today.   Objective: Vitals:   11/12/23 1405 11/12/23 1415  BP: 133/87 131/82  Pulse: (!) 55 (!) 54  Resp: (!) 8 16  Temp: 97.8 F (36.6 C)   SpO2: 98% 98%    Intake/Output Summary (Last 24 hours) at 11/12/2023 1450 Last data filed at  11/11/2023 1900 Gross per 24 hour  Intake 240 ml  Output --  Net 240 ml   Filed Weights   11/10/23 1315 11/12/23 0352 11/12/23 1405  Weight: 74 kg 72.2 kg 70.6 kg    Exam:  General:  Appears calm and comfortable and is in NAD Eyes:   normal lids, iris ENT:  grossly normal hearing, lips & tongue, mmm Cardiovascular:  RRR. No LE edema.  Respiratory:   CTA bilaterally with no wheezes/rales/rhonchi.  Normal respiratory effort. Abdomen:  soft, NT, ND Skin:  no rash or induration seen on limited  exam Musculoskeletal:  s/p R BKA, sleeve in place; L posterior lower leg dressing is in place Psychiatric:  grossly normal mood and affect, speech fluent and appropriate, AOx3 Neurologic:  CN 2-12 grossly intact, moves all extremities in coordinated fashion  Data Reviewed: I have reviewed the patient's lab results since admission.  Pertinent labs for today include:   None today     Family Communication: None present  Mobility: PT/OT Consulted and are recommending - Skilled Nursing-Short Term Rehab (<3 Hours/Day)11/05/2023 1321    Code Status: Full Code  Barriers to discharge:  Awaiting insurance authorization.  Was informed today that he needs a new PT evaluation prior to requesting auth.  PT consulted and will try to see after HD today.  Disposition: Status is: Inpatient Remains inpatient appropriate because: awaiting placement     Time spent: 35 minutes  Unresulted Labs (From admission, onward)    None        Author: Delon Herald, MD 11/12/2023 2:50 PM  For on call review [email or web address].

## 2023-11-12 NOTE — Plan of Care (Signed)
  Problem: Education: Goal: Knowledge of General Education information will improve Description: Including pain rating scale, medication(s)/side effects and non-pharmacologic comfort measures Outcome: Progressing   Problem: Health Behavior/Discharge Planning: Goal: Ability to manage health-related needs will improve Outcome: Progressing   Problem: Clinical Measurements: Goal: Ability to maintain clinical measurements within normal limits will improve Outcome: Progressing Goal: Will remain free from infection Outcome: Progressing Goal: Diagnostic test results will improve Outcome: Progressing Goal: Respiratory complications will improve Outcome: Progressing Goal: Cardiovascular complication will be avoided Outcome: Progressing   Problem: Clinical Measurements: Goal: Will remain free from infection Outcome: Progressing   Problem: Clinical Measurements: Goal: Diagnostic test results will improve Outcome: Progressing   Problem: Clinical Measurements: Goal: Respiratory complications will improve Outcome: Progressing   Problem: Activity: Goal: Risk for activity intolerance will decrease Outcome: Progressing   Problem: Nutrition: Goal: Adequate nutrition will be maintained Outcome: Progressing   Problem: Pain Managment: Goal: General experience of comfort will improve and/or be controlled Outcome: Progressing   Problem: Elimination: Goal: Will not experience complications related to bowel motility Outcome: Progressing Goal: Will not experience complications related to urinary retention Outcome: Progressing   Problem: Activity: Goal: Capacity to carry out activities will improve Outcome: Progressing   Problem: Cardiac: Goal: Ability to achieve and maintain adequate cardiopulmonary perfusion will improve Outcome: Progressing   Problem: Fluid Volume: Goal: Compliance with measures to maintain balanced fluid volume will improve Outcome: Progressing   Problem:  Health Behavior/Discharge Planning: Goal: Ability to manage health-related needs will improve Outcome: Progressing   Problem: Nutritional: Goal: Ability to make healthy dietary choices will improve Outcome: Progressing   Problem: Clinical Measurements: Goal: Complications related to the disease process, condition or treatment will be avoided or minimized Outcome: Progressing   Problem: Education: Goal: Understanding of CV disease, CV risk reduction, and recovery process will improve Outcome: Progressing Goal: Individualized Educational Video(s) Outcome: Progressing   Problem: Activity: Goal: Ability to return to baseline activity level will improve Outcome: Progressing   Problem: Health Behavior/Discharge Planning: Goal: Ability to safely manage health-related needs after discharge will improve Outcome: Progressing

## 2023-11-12 NOTE — Progress Notes (Signed)
 [facility] Progress Note   Dialysis Orders: [facility]  on MWF . EDW 84kg HD [place] 2K/2.5Ca  Time 4:00 Heparin  1000 bolus then 600 units/hr. 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  Left lower extremity cellulitis - s/p IV antibiotics.  ESRD -  HD on MWF. Needs new outpatient center due to SNF. Renal navigator aware, waiting placement first.  Hypertension/volume  - BP better with UF.  Under EDW now. Will need lower EDW on d/c post BKA.  UF as tolerated. Anemia  - Hgb 9.8.  Continue Aranesp  100 q week.  Metabolic bone disease - Phos in range. Corr Ca elevated. Hold calcitriol , decrease Phoslo  binder to 2 q meals. Follow.  Afib - on Eliquis   Dispo - pending SNF placement and outpatient dialysis placement. CM/SW involved.   Subjective:  Seen in room. No issues with dialysis yesterday. No new events. Discharge planning ongoing.  Still has pain in the right leg, improved with pain medications but just takes the edge off.  Denies any fever, chills, nausea, vomiting, shortness of breath.   Objective Vitals:   11/10/23 1732 11/10/23 2056 11/11/23 0756 11/12/23 0352  BP: 111/76 107/67 (!) 138/90 (!) 150/88  Pulse: 64 74 62 (!) 59  Resp: (!) 21 20  18   Temp:  98.6 F (37 C) 98 F (36.7 C) 98.4 F (36.9 C)  TempSrc:  Oral Oral Oral  SpO2: 100% 94% 97% 93%  Weight:    72.2 kg  Height:       Physical Exam General:  alert, lying bed, nad  Heart: RRR  Lungs: Clear bilaterally  Abdomen: non tender  Extremities:no LE edema, R BKA Dialysis Access: LU AVF   Filed Weights   11/09/23 0603 11/10/23 1315 11/12/23 0352  Weight: 73.3 kg 74 kg 72.2 kg    Intake/Output Summary (Last 24 hours) at 11/12/2023 1002 Last data filed at 11/11/2023 1900 Gross per 24 hour  Intake 480 ml  Output  --  Net 480 ml     Additional Objective Labs: Basic Metabolic Panel: Recent Labs  Lab 11/06/23 0811 11/07/23 0348 11/08/23 0344 11/09/23 1124 11/10/23 0426  NA 131*   < > 131* 128* 130*  K 4.5   < > 4.1 4.4 4.6  CL 93*   < > 93* 90* 89*  CO2 24   < > 27 25 28   GLUCOSE 79   < > 88 90 82  BUN 45*   < > 45* 35* 43*  CREATININE 8.69*   < > 8.82* 7.04* 8.56*  CALCIUM  9.7   < > 9.9 9.4 10.4*  PHOS 4.7*  --  4.8* 3.7  --    < > = values in this interval not displayed.    CBC: Recent Labs  Lab 11/06/23 0811 11/07/23 0348 11/08/23 0344 11/09/23 1124 11/10/23 0426  WBC 9.1 9.4 8.6 9.7 8.8  HGB 8.9* 10.7* 9.8* 10.4* 9.7*  HCT 28.1* 34.0* 30.7* 32.1* 30.9*  MCV 87.5 88.1 87.5 86.5 88.5  PLT 225 271 295 338 290    Medications:  anticoagulant sodium citrate         apixaban   5 mg Oral BID   aspirin  EC  81 mg Oral Daily   atorvastatin   10 mg  Oral Daily   calcium  acetate  1,334 mg Oral TID WC   Chlorhexidine  Gluconate Cloth  6 each Topical Q0600   cinacalcet   30 mg Oral Q M,W,F-1800   [START ON 11/15/2023] darbepoetin (ARANESP ) injection - DIALYSIS  100 mcg Subcutaneous Q Mon-1800   ferrous sulfate   325 mg Oral Q breakfast   gabapentin   200 mg Oral QHS   irbesartan   300 mg Oral Daily   metoprolol  succinate  75 mg Oral Daily   sodium chloride  flush  3 mL Intravenous Q12H

## 2023-11-12 NOTE — Progress Notes (Signed)
  Received patient in bed to unit.   Informed consent signed and in chart.    TX duration:3.5     Transported by  Hand-off given to patient's nurse.    Access used: [place] Access issues: None   Total UF removed:  Medication(s) given:  Post HD VS: 139/89 Post HD weight: 68.5     Kamila Broda LPN [facility]

## 2023-11-13 DIAGNOSIS — I70201 Unspecified atherosclerosis of native arteries of extremities, right leg: Secondary | ICD-10-CM | POA: Diagnosis not present

## 2023-11-13 MED ORDER — BISACODYL 5 MG PO TBEC: 5.0000 mg | DELAYED_RELEASE_TABLET | Freq: Every day | ORAL | Status: AC | PRN

## 2023-11-13 MED ORDER — ORAL CARE MOUTH RINSE: 15.0000 mL | OROMUCOSAL | Status: AC | PRN

## 2023-11-13 MED ORDER — PROSOURCE PLUS PO LIQD
30.0000 mL | Freq: Two times a day (BID) | ORAL | Status: DC
  Administered 2023-11-13: 30 mL via ORAL
  Filled 2023-11-13: qty 30

## 2023-11-13 MED ORDER — CINACALCET HCL 30 MG PO TABS: 30.0000 mg | ORAL_TABLET | ORAL | Status: AC

## 2023-11-13 MED ORDER — ASPIRIN 81 MG PO TBEC: 81.0000 mg | DELAYED_RELEASE_TABLET | Freq: Every day | ORAL | Status: AC

## 2023-11-13 MED ORDER — GABAPENTIN 100 MG PO CAPS: 200.0000 mg | ORAL_CAPSULE | Freq: Every day | ORAL | 0 refills | Status: AC

## 2023-11-13 MED ORDER — ACETAMINOPHEN 325 MG PO TABS: 650.0000 mg | ORAL_TABLET | Freq: Four times a day (QID) | ORAL | Status: AC | PRN

## 2023-11-13 MED ORDER — CALCIUM ACETATE (PHOS BINDER) 667 MG PO CAPS
1334.0000 mg | ORAL_CAPSULE | Freq: Three times a day (TID) | ORAL | Status: AC
Start: 2023-11-13 — End: ?

## 2023-11-13 MED ORDER — DARBEPOETIN ALFA 100 MCG/0.5ML IJ SOSY: 100.0000 ug | PREFILLED_SYRINGE | INTRAMUSCULAR | Status: AC

## 2023-11-13 MED ORDER — OXYCODONE HCL 5 MG PO TABS: 5.0000 mg | ORAL_TABLET | ORAL | 0 refills | Status: AC | PRN

## 2023-11-13 MED ORDER — PROSOURCE PLUS PO LIQD: 30.0000 mL | Freq: Two times a day (BID) | ORAL | Status: AC

## 2023-11-13 NOTE — TOC Transition Note (Signed)
 Transition of Care [facility]) - Discharge Note   Patient Details  Name: Jacob Daniel MRN: [id number] Date of Birth: October 29, 1955  [facility]) CM/SW Contact:  Montie LOISE Louder, LCSW Phone Number: 11/13/2023, 12:30 PM   Clinical Narrative:     Patient will Discharge to: [name] Discharge Date: 11/13/23 Family Notified: Transport By: ROME  Per MD patient is ready for discharge. RN, patient, and facility notified of discharge. Discharge Summary sent to facility. RN given number for [phone]. Ambulance transport requested for patient.   Clinical Social Worker signing off.  Montie Louder, MSW, LCSW Clinical Social Worker     Final next level of care: Skilled Nursing Facility Barriers to Discharge: Barriers Resolved   Patient Goals and CMS Choice            Discharge Placement                       Discharge Plan and Services Additional resources added to the After Visit Summary for                                       Social Drivers of Health (SDOH) Interventions SDOH Screenings   Food Insecurity: No Food Insecurity (10/25/2023)  Housing: Low Risk  (10/25/2023)  Transportation Needs: No Transportation Needs (10/25/2023)  Utilities: Not At Risk (10/25/2023)  Financial Resource Strain: Low Risk  (11/05/2022)   Received from [facility]  Social Connections: Moderately Integrated (10/25/2023)  Tobacco Use: Low Risk  (10/29/2023)     Readmission Risk Interventions    05/05/2023   12:37 PM  Readmission Risk Prevention Plan  Transportation Screening Complete  Medication Review (RN Care Manager) Referral to Pharmacy  HRI or Home Care Consult Complete  SW Recovery Care/Counseling Consult Complete  Palliative Care Screening Not Applicable  Skilled Nursing Facility Not Applicable

## 2023-11-13 NOTE — Progress Notes (Addendum)
 This nurse attempted to call [name] [phone] twice @1500  & @1525  to give report. No vm available. D/C Nurse aware.    Called back @1640 . Gave report to [place].

## 2023-11-13 NOTE — Discharge Summary (Signed)
 Physician Discharge Summary   Patient: Jacob Daniel MRN: [id number] DOB: 09-05-1955  Admit date:     10/25/2023  Discharge date: 11/13/23  Discharge Physician: Delon Herald   PCP: Pcp, No   Recommendations at discharge:   You are being discharged to [facility] for skilled nursing care Continue to follow up for dialysis in [place] on MWF Follow up with vascular surgery in 4 weeks Follow up with PCP after discharge from rehab Take all medications as directed  Discharge Diagnoses: Principal Problem:   Popliteal artery occlusion, right (HCC) Active Problems:   ESRD (end stage renal disease) on dialysis (HCC)   Chronic systolic CHF (congestive heart failure) (HCC)   History of CAD (coronary artery disease)   Essential hypertension   Hyperlipidemia   Paroxysmal atrial fibrillation (HCC)   OSA (obstructive sleep apnea)   Hospital Course: 68yo with h/o ESRD on MWF HD, failed renal transplant, HTN, HLD, PAF, chronic HFrEF, CAD, and OSA who presented on 8/11 with critical limb ischemia, failed outpatient antibiotic therapy.  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  Completed antibiotics.  His primary issue now is disposition, as he has been declined by all 5 [facility] HD centers in [place] due to prior behavior.  He appears to be able to return to his HD center in [place] and has been offered a bed at [facility] in [place].  Assessment and Plan:  Critical limb ischemia of RLE Thrombosis of the popliteal artery from above the knee, nonopacification of the trifurcation vessels below the knee, with small toe ulceration ABI indicated mild right lower extremity arterial disease may be falsely elevated due to arterial wall calcification right TBI is absent left ABI indicates noncompressible arteries left TBI absent  Aortogram on 8/13 R BKA on 8/15 Having ongoing pain - increased gabapentin  to 200 mg at bedtime Continue oxy   LLE cellulitis Non-healing wound on left calf -believed to be  spider bite  Completed antibiotics  Wound care   ESRD on HD MWF Failed renal transplant - no longer taking tacrolimus  Continue MWF HD Cont home PhosLo  (not taking, resumed) Started on Sensipar  He has had reported behavioral issues at HD at multiple sites (he reports that he likes the girls) and so appears to be unable to be placed in [place] since none of the [employer] sites will accept him He was attending HD in [place] and plan is to return there   HTN BP well-controlled on metoprolol  and Avapro  (resumed, was not taking at home)   HLD Resume Lipitor (has not been taking at home)   PAF Rate controlled with metoprolol  (resumed) Resumed on Eliquis  (inconsistently taking at home)   Chronic systolic CHF/pulmonary hypertension/heart failure Euvolemic, continue to address fluid status with dialysis   CAD No chest pain   OSA Did not tolerate so not using for few years   Disposition [facility] in [place] is willing to accept the patient and it appears that he can go on 8/29 Will need his HD facility to agree that he can return Will need insurance authorization PT/OT reassessments are pending and patient is strongly encouraged to participate       Consultants: Vascular surgery Nephrology Orthopedic surgery OT PT TOC team   Procedures: ABI 8/12 Abdominal aortogram 8/13 R BKA 8/15   Antibiotics: Zosyn  x 1 Vancomycin  8/11-18    Pain control - [place]  Controlled Substance Reporting System database was reviewed. and patient was instructed, not to drive, operate heavy machinery, perform activities at  heights, swimming or participation in water activities or provide baby-sitting services while on Pain, Sleep and Anxiety Medications; until their outpatient Physician has advised to do so again. Also recommended to not to take more than prescribed Pain, Sleep and Anxiety Medications.   Disposition: Skilled nursing facility Diet  recommendation:  Renal diet DISCHARGE MEDICATION: Allergies as of 11/13/2023   No Known Allergies      Medication List     STOP taking these medications    sildenafil 20 MG tablet Commonly known as: REVATIO   sodium bicarbonate  650 MG tablet   tacrolimus  0.5 MG capsule Commonly known as: PROGRAF        TAKE these medications    (feeding supplement) PROSource Plus liquid Take 30 mLs by mouth 2 (two) times daily between meals.   acetaminophen  325 MG tablet Commonly known as: TYLENOL  Take 2 tablets (650 mg total) by mouth every 6 (six) hours as needed for mild pain (pain score 1-3) or fever (or Fever >/= 101).   albuterol  0.63 MG/3ML nebulizer solution Commonly known as: ACCUNEB  Take 1 ampule by nebulization every 6 (six) hours as needed for wheezing. What changed: Another medication with the same name was removed. Continue taking this medication, and follow the directions you see here.   aspirin  EC 81 MG tablet Take 1 tablet (81 mg total) by mouth daily. Swallow whole. Start taking on: November 14, 2023   atorvastatin  10 MG tablet Commonly known as: LIPITOR Take 1 tablet (10 mg total) by mouth daily.   bisacodyl  5 MG EC tablet Commonly known as: DULCOLAX Take 1 tablet (5 mg total) by mouth daily as needed for moderate constipation.   calcium  acetate 667 MG capsule Commonly known as: PHOSLO  Take 2 capsules (1,334 mg total) by mouth 3 (three) times daily with meals. What changed: how much to take   cinacalcet  30 MG tablet Commonly known as: SENSIPAR  Take 1 tablet (30 mg total) by mouth every Monday, Wednesday, and Friday at 6 PM. Start taking on: November 15, 2023   Darbepoetin Alfa  100 MCG/0.5ML Sosy injection Commonly known as: ARANESP  Inject 0.5 mLs (100 mcg total) into the skin every Monday at 6 PM. Start taking on: November 15, 2023   Eliquis  5 MG Tabs tablet Generic drug: apixaban  Take 1 tablet (5 mg total) by mouth 2 (two) times daily.   gabapentin   100 MG capsule Commonly known as: NEURONTIN  Take 2 capsules (200 mg total) by mouth at bedtime.   ipratropium-albuterol  0.5-2.5 (3) MG/3ML Soln Commonly known as: DUONEB Take 3 mLs by nebulization every 6 (six) hours as needed (for shortness of breath and wheezing).   irbesartan  300 MG tablet Commonly known as: AVAPRO  Take 1 tablet (300 mg total) by mouth daily.   magnesium  oxide 400 MG tablet Commonly known as: MAG-OX Take 1 tablet (400 mg total) by mouth daily.   metoprolol  succinate 25 MG 24 hr tablet Commonly known as: TOPROL -XL Take 3 tablets (75 mg total) by mouth daily.   mouth rinse Liqd solution 15 mLs by Mouth Rinse route as needed (for oral care).   oxyCODONE  5 MG immediate release tablet Commonly known as: Oxy IR/ROXICODONE  Take 1 tablet (5 mg total) by mouth every 4 (four) hours as needed for moderate pain (pain score 4-6).   triamcinolone ointment 0.1 % Commonly known as: KENALOG Apply 1 Application topically 3 (three) times daily.               Discharge Care Instructions  (From admission,  onward)           Start     Ordered   11/13/23 0000  Discharge wound care:       Comments: Cleanse left posterior leg wound with NS and pat dry. Apply Xeroform to wound bed and cover with foam dressing.  Change every other day.  Date dressing.  Left medial heel:  Off load pressure.  Apply Prevalon boot.  Right staple line:  Keep compression garment over stump.  If draining excessively, may apply dry dressing and kerlix/tape.   11/13/23 1220            Contact information for follow-up providers     [facility] [place] Follow up in 4 week(s).   Specialty: Vascular Surgery Why: Office will call to arrange your appt(s) Contact information: [address] [place]  [address] [phone]             Contact information for after-discharge care     Destination      [facility] .   Service: Skilled Nursing Contact information: [address] [place]  [address] [phone]                    Discharge Exam:   Subjective: No complaints today when I saw him.  The nurse subsequently reported some mild bleeding from his BKA stump (wrapped with ACE) and ongoing pain control issues (no change at this time).   Objective: Vitals:   11/13/23 0738 11/13/23 1130  BP: 109/64 130/76  Pulse: 77 67  Resp: 19 20  Temp: 98.6 F (37 C) 98.4 F (36.9 C)  SpO2: 95% 97%    Intake/Output Summary (Last 24 hours) at 11/13/2023 1220 Last data filed at 11/12/2023 2202 Gross per 24 hour  Intake 10 ml  Output 2000 ml  Net -1990 ml   Filed Weights   11/12/23 1405 11/12/23 1747 11/13/23 0500  Weight: 70.6 kg 68.5 kg 72.2 kg    Exam:  General:  Appears calm and comfortable and is in NAD Eyes:   normal lids, iris ENT:  grossly normal hearing, lips & tongue, mmm Cardiovascular:  RRR. No LE edema.  Respiratory:   CTA bilaterally with no wheezes/rales/rhonchi.  Normal respiratory effort. Abdomen:  soft, NT, ND Skin:  no rash or induration seen on limited exam Musculoskeletal:  s/p R BKA, sleeve in place; L posterior lower leg dressing is in place Psychiatric:  grossly normal mood and affect, speech fluent and appropriate, AOx3 Neurologic:  CN 2-12 grossly intact, moves all extremities in coordinated fashion  Data Reviewed: I have reviewed the patient's lab results since admission.  Pertinent labs for today include:  None today     Condition at discharge: stable  The results of significant diagnostics from this hospitalization (including imaging, microbiology, ancillary and laboratory) are listed below for reference.   Imaging Studies: PERIPHERAL VASCULAR CATHETERIZATION Result Date: 10/27/2023 Images from the original result were not included. Patient name: Bensyn Bornemann MRN:  [id number] DOB: 1955/12/17 Sex: male 10/25/2023 - 10/27/2023 Pre-operative Diagnosis: Right lower extremity rest pain Post-operative diagnosis:  Same Surgeon:  Fonda FORBES Rim, MD Procedure Performed: 1.  Ultrasound-guided micropuncture access of the left common femoral artery in retrograde fashion 2.  Aortogram 3.  Second order cannulation, left lower extremity angiogram 4.  Selective angiography of the superficial femoral artery 5.  Selective  angiography of the posterior tibial artery 6.  Attempted recanalization of the posterior tibial artery-unsuccessful 7.  Left lower extremity angiogram 8.  Arteriotomy manage with next device Contrast volume 140 cc, sedation time 72 minutes Indications: Patient is a 68 year old male with peripheral arterial disease and right sided rest pain.  He presented to the hospital due to worsening pain in the right toes.  He has a wound on the left calf from a spider bite.  I have discussed the risks and benefits of right lower extremity angiography in an effort to define and improve distal perfusion to alleviate rest pain, Dolph elected to proceed. Findings: Sluggish flow throughout indicative of heart failure-known ejection fraction 30 to 35% 6 months ago Aorta: Severe atherosclerotic disease throughout.  No flow-limiting stenosis identified Right leg: Severe atherosclerotic disease throughout.  Widely patent common femoral artery, profunda, superficial femoral artery, popliteal artery.  Distally there is single-vessel peroneal artery outflow filling the foot through collaterals.  The posterior tibial artery has a high takeoff, but occludes.  Severe small vessel disease. Left leg: Severe atherosclerotic disease throughout.  Widely patent common femoral artery, profunda, superficial femoral artery, popliteal artery.  There appears to be three-vessels opacifying in the calf, however distally contrast washout due to heart failure, and distal tibial vessels and pedal vessels were not  visualized.  Procedure:  The patient was identified in the holding area and taken to room 8.  The patient was then placed supine on the table and prepped and draped in the usual sterile fashion.  A time out was called.  Moderate sedation was administered using Fentanyl  and Versed .  Ultrasound was used to evaluate the left common femoral artery.  It was patent.  A digital ultrasound image was acquired.  A micropuncture needle was used to access the left common femoral artery under ultrasound guidance.  An 018 wire was advanced without resistance and a micropuncture sheath was placed.  The 018 wire was removed and a benson wire was placed.  The micropuncture sheath was exchanged for a 5 french sheath.  An omniflush catheter was advanced over the wire to the level of L-1.  An abdominal angiogram was obtained.  Next, using the omniflush catheter and a benson wire, the aortic bifurcation was crossed and the catheter was placed into theright external iliac artery and right runoff was obtained.  left runoff was performed via retrograde sheath injections. See results above. I elected to attempt intervention on the posterior tibial artery as it appeared to fill in very small islands from collaterals.  A 6 x 60 cm sheath was brought into the field and parked in the right superficial femoral artery.  The patient was heparinized.  A series of wires and catheters were used to navigate into the high takeoff of the posterior tibial artery.  A series of wires and catheters were used including 0.014 wires, however I was unable to cross the occlusion.  I elected not to attempt retrograde intervention as outflow was poor.  I closed the percutaneous access site with a Mynx device.  The patient became very aggravated and angry causing the Mynx device to fail and manual pressure was held.  The patient was administered another 1 mg of Versed  to allow for safe arteriotomy management. Impression: Unsuccessful attempt at recanalization of  the right posterior tibial artery.  Severe microvascular disease.  Patient is maximally revascularized with no further options.  Pain is multifactorial from sluggish flow due to heart failure and severe peripheral arterial, and  microvascular disease. No wounds or rest pain in the right leg.  Patient would benefit from orthopedic surgery evaluation, likely debridement.  Should wound healing be a problem, would happily perform diagnostic angiography with possible intervention on the left leg. Fonda FORBES Rim MD [facility] Office: [phone]   VAS US  ABI WITH/WO TBI Result Date: 10/26/2023  LOWER EXTREMITY DOPPLER STUDY Patient Name:  Benuel Ly  Date of Exam:   10/26/2023 Medical Rec #: [id number]         Accession #:    [id number] Date of Birth: 08-28-1955         Patient Gender: M Patient Age:   64 years Exam Location:  [facility] Procedure:      VAS US  ABI WITH/WO TBI Referring Phys: JOSHUA ROBINS --------------------------------------------------------------------------------  Indications: critical limb ischemia High Risk Factors: Hypertension, hyperlipidemia, Diabetes, no history of tobacco                    use, marijuana use, coronary artery disease. Other Factors: CHF, Afib, ESRD (HD).  Comparison Study: No previous exams Performing Technologist: Hill, Jody RVT, RDMS  Examination Guidelines: A complete evaluation includes at minimum, Doppler waveform signals and systolic blood pressure reading at the level of bilateral brachial, anterior tibial, and posterior tibial arteries, when vessel segments are accessible. Bilateral testing is considered an integral part of a complete examination. Photoelectric Plethysmograph (PPG) waveforms and toe systolic pressure readings are included as required and additional duplex testing as needed. Limited examinations for reoccurring indications may be performed as noted.  ABI Findings:  +---------+------------------+-----+-------------------+-------------+ Right    Rt Pressure (mmHg)IndexWaveform           Comment       +---------+------------------+-----+-------------------+-------------+ Brachial 168                    multiphasic        Hx of RUE AVF +---------+------------------+-----+-------------------+-------------+ PTA      109               0.65 dampened monophasic              +---------+------------------+-----+-------------------+-------------+ DP       138               0.82 monophasic                       +---------+------------------+-----+-------------------+-------------+ Great Toe0                 0.00 Absent                           +---------+------------------+-----+-------------------+-------------+ +---------+------------------+-----+-------------------+---------+ Left     Lt Pressure (mmHg)IndexWaveform           Comment   +---------+------------------+-----+-------------------+---------+ Brachial                                           HD access +---------+------------------+-----+-------------------+---------+ PTA      254               1.51 dampened monophasic          +---------+------------------+-----+-------------------+---------+ DP       254               1.51 dampened monophasic          +---------+------------------+-----+-------------------+---------+  Great Toe0                 0.00 Absent                       +---------+------------------+-----+-------------------+---------+  Arterial wall calcification precludes accurate ankle pressures and ABIs.  Summary: Right: Resting right ankle-brachial index indicates mild right lower extremity arterial disease, however this may be falsely elevated due to arterial wall calcification. The right toe-brachial index is absent. Left: Resting left ankle-brachial index indicates noncompressible left lower extremity arteries. The left toe-brachial index is absent.  *See table(s) above for measurements and observations.  Electronically signed by Penne Colorado MD on 10/26/2023 at 10:07:25 PM.    Final    CT Angio Aortobifemoral W and/or Wo Contrast Result Date: 10/25/2023 CLINICAL DATA:  Claudication or leg ischemia, right foot pain/swelling for 3 days EXAM: CT ANGIOGRAPHY CHEST WITH CONTRAST TECHNIQUE: Multidetector CT imaging of the abdomen and pelvis with run-off to both lower extremities was performed using the standard protocol during bolus administration of intravenous contrast. Multiplanar CT image reconstructions and MIPs were obtained to evaluate the vascular anatomy. RADIATION DOSE REDUCTION: This exam was performed according to the departmental dose-optimization program which includes automated exposure control, adjustment of the mA and/or kV according to patient size and/or use of iterative reconstruction technique. CONTRAST:  OMNIPAQUE  IOHEXOL  350 MG/ML SOLN COMPARISON:  May 04, 2023 FINDINGS: VASCULAR Aorta: No aneurysm or aortic dissection. No acute thrombus. Diffuse calcified atherosclerosis throughout. Celiac: Patent without aneurysm or dissection.No hemodynamically significant stenosis. SMA: Patent without aneurysm or dissection.No hemodynamically significant stenosis. Renals: Patent without aneurysm or dissection.Diffusely calcified with moderate stenoses of both renal ostia from calcified plaques. IMA: Patent without aneurysm or dissection.Moderate to severe stenosis of the ostium due to calcified plaque. RIGHT Lower Extremity Inflow: Common, internal and external iliac arteries are patent without aneurysm, acute thrombus, or dissection. Diffuse calcified atherosclerosis throughout without hemodynamically significant stenosis. Outflow: The common and proximal superficial femoral arteries are patent with diffuse calcified plaque throughout. The profunda femoral artery is also patent with diffuse calcified atherosclerosis throughout. Decreased  opacification of the mid and distal superficial femoral artery with thrombosis of the popliteal artery starting in the above knee segment. Runoff: Extensive calcified atherosclerosis throughout the trifurcation vessels, which are not opacified. LEFT Lower Extremity Inflow: Common, internal and external iliac arteries are patent without aneurysm, acute thrombus, or dissection. Diffuse calcified atherosclerosis throughout without hemodynamically significant stenosis. Outflow: Common, superficial, and profunda femoral are patent without acute thrombus, aneurysm, or dissection. Diffuse calcified atherosclerosis throughout the common and superficial femoral arteries without hemodynamically significant stenosis. The popliteal artery is also diffusely calcified. There is delayed perfusion of the above and behind knee popliteal artery. Runoff: Extensive calcified atherosclerosis throughout the trifurcation vessels, which are not opacified. Veins: Not evaluated due to the phase of contrast. Review of the MIP images confirms the above findings. NON-VASCULAR Lower chest: Mild diffuse prominence of the pulmonary interstitium with intralobular septal thickening.Bibasilar atelectasis. Mild cardiomegaly with densely calcified coronary arteries. Hepatobiliary: No mass.Multiple small radiopaque gallstones. No wall thickening.No intrahepatic or extrahepatic biliary ductal dilation. Pancreas: No mass or main ductal dilation.No peripancreatic inflammation or fluid collection. Spleen: Normal size. No mass. Adrenals/Urinary Tract: No adrenal masses. The renal cortices of both kidneys are full of cysts. No hydronephrosis or nephrolithiasis. The urinary bladder is completely decompressed. 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 Stomach/Bowel: The stomach is decompressed without focal abnormality. No small bowel wall thickening  or inflammation. No small bowel obstruction. Scattered total colonic diverticulosis.  Vascular/Lymphatic: No intraabdominal or pelvic lymphadenopathy. Reproductive: No prostatomegaly.No free pelvic fluid. Other: Moderate volume ascites.  No pneumoperitoneum. Musculoskeletal: No acute fracture or destructive lesion.Subcutaneous edema in both calves. Bone changes consistent with renal osteodystrophy. Multilevel degenerative disc disease of the spine. IMPRESSION: VASCULAR No abdominal aortic aneurysm or aortic dissection. Extensive aortic atherosclerosis without hemodynamically significant stenosis. Severe peripheral vascular atherosclerosis also noted in both legs. Aortic Atherosclerosis (ICD10-I70.0). Right leg: 1. Diffuse calcified atherosclerosis throughout the inflow and proximal outflow vessels. Decreased opacification of the mid and distal superficial femoral artery with thrombosis of the popliteal artery starting in the above knee segment. 2. Non opacification of the trifurcation vessels below the knee, which may be due to non opacification or thrombosis. Left leg: 1. No acute thrombus or hemodynamically significant stenosis within the inflow or proximal outflow vessels. 2. Delayed contrast opacification of the above and behind knee popliteal artery without significant opacification of the trifurcation vessels below the knee, possibly due to slow flow. NON-VASCULAR 1. Mild prominence of the pulmonary interstitium in both lung bases with minimal intralobular septal thickening, likely representing early changes of edema. 2. Total colonic diverticulosis. No changes of acute diverticulitis. 3. Few small calcified gallstones present. 4. Moderate volume ascites. Critical Value/emergent results were called by telephone at the time of interpretation on 10/25/2023 at 3:28 pm to provider BERNARDINO FIREMAN , who verbally acknowledged these results. Electronically Signed   By: Rogelia Myers M.D.   On: 10/25/2023 15:37   DG Foot 2 Views Right Result Date: 10/25/2023 CLINICAL DATA:  Foot pain, second digit EXAM:  RIGHT FOOT - 2 VIEW COMPARISON:  None Available. FINDINGS: There is no evidence of fracture or dislocation. Sclerotic lesion in distal end of the second metatarsal bone likely bone island, similar to prior exam from 2021. There is no evidence of arthropathy or other focal bone abnormality. Small calcaneal spur. Diffuse vascular calcifications. Soft tissues are otherwise unremarkable. IMPRESSION: No acute findings. Electronically Signed   By: Megan  Zare M.D.   On: 10/25/2023 12:05    Microbiology: Results for orders placed or performed during the hospital encounter of 10/25/23  Culture, blood (routine x 2)     Status: None   Collection Time: 10/25/23 11:23 AM   Specimen: BLOOD  Result Value Ref Range Status   Specimen Description BLOOD SITE NOT SPECIFIED  Final   Special Requests   Final    BOTTLES DRAWN AEROBIC AND ANAEROBIC Blood Culture adequate volume   Culture   Final    NO GROWTH 5 DAYS Performed at [facility], [address] [place], [place] [address]    Report Status 10/30/2023 FINAL  Final  Culture, blood (routine x 2)     Status: None   Collection Time: 10/25/23 11:56 AM   Specimen: BLOOD  Result Value Ref Range Status   Specimen Description BLOOD SITE NOT SPECIFIED  Final   Special Requests   Final    BOTTLES DRAWN AEROBIC AND ANAEROBIC Blood Culture results may not be optimal due to an inadequate volume of blood received in culture bottles   Culture   Final    NO GROWTH 5 DAYS Performed at [facility], [address] [place], [place] [address]    Report Status 10/30/2023 FINAL  Final  Surgical PCR screen     Status: None   Collection Time: 10/28/23 11:51 AM   Specimen: Nasal Mucosa; Nasal Swab  Result Value Ref Range Status   MRSA, PCR NEGATIVE NEGATIVE Final   Staphylococcus aureus NEGATIVE NEGATIVE Final    Comment: (NOTE) The Xpert SA Assay (FDA approved for NASAL specimens in patients 62 years of age and older), is one component of a  comprehensive surveillance program. It is not intended to diagnose infection nor to guide or monitor treatment. Performed at [facility], [address] [place], [place] [address]     Labs: CBC: Recent Labs  Lab 11/07/23 [phone] 11/08/23 0344 11/09/23 1124 11/10/23 0426  WBC 9.4 8.6 9.7 8.8  HGB 10.7* 9.8* 10.4* 9.7*  HCT 34.0* 30.7* 32.1* 30.9*  MCV 88.1 87.5 86.5 88.5  PLT 271 295 338 290   Basic Metabolic Panel: Recent Labs  Lab 11/07/23 0348 11/08/23 0344 11/09/23 1124 11/10/23 0426  NA 133* 131* 128* 130*  K 4.1 4.1 4.4 4.6  CL 95* 93* 90* 89*  CO2 28 27 25 28   GLUCOSE 87 88 90 82  BUN 31* 45* 35* 43*  CREATININE 6.80* 8.82* 7.04* 8.56*  CALCIUM  10.0 9.9 9.4 10.4*  MG 2.1 2.1  --   --   PHOS  --  4.8* 3.7  --    Liver Function Tests: Recent Labs  Lab 11/09/23 1124  ALBUMIN  2.2*   CBG: Recent Labs  Lab 11/11/23 0906  GLUCAP 118*    Discharge time spent: greater than 30 minutes.  Signed: Delon Herald, MD [facility] 11/13/2023

## 2023-11-13 NOTE — Evaluation (Signed)
 Physical Therapy Re-Evaluation Patient Details Name: Jacob Daniel MRN: [id number] DOB: Aug 05, 1955 Today's Date: 11/13/2023  History of Present Illness  Pt is 68 year old presented to [facility] on  10/25/23 for rt foot pain and lt calf ulceration (spider bite per pt). Underwent RLE angiogram with unsuccessful attempted recanalization of the rt posterior tibial artery on 8/13. 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 PMH - CAD, ESRD on HD, HTN, HLD, A-Fib, heart failure, pulmonary hypertension,  chronic hypokalemia, OSA, failed renal transplant   Clinical Impression  Order acknowledged. Patient re-evaluated, agreeable to participate with rehab efforts. Flat but no agitation during evaluation. Min assist for bed mobility, several attempts to stand with Total assist, high fear of falling, difficulty with anterior weight shift (try Stedy next visit.) Mod assist for lateral scoot with squat to adequately clear buttocks. Good UE strength to push up. Patient will benefit from continued inpatient follow up therapy, <3 hours/day       If plan is discharge home, recommend the following: A lot of help with bathing/dressing/bathroom;Assist for transportation;Assistance with cooking/housework;Two people to help with walking and/or transfers   Can travel by private vehicle   No    Equipment Recommendations Wheelchair (measurements PT);Wheelchair cushion (measurements PT);Hospital bed;Hoyer lift  Recommendations for Other Services       Functional Status Assessment Patient has had a recent decline in their functional status and demonstrates the ability to make significant improvements in function in a reasonable and predictable amount of time.     Precautions / Restrictions Precautions Precautions: Fall Recall of Precautions/Restrictions: Impaired Restrictions Weight Bearing Restrictions Per Provider Order: Yes RLE Weight Bearing Per Provider Order: Non weight bearing      Mobility  Bed Mobility Overal bed  mobility: Needs Assistance Bed Mobility: Supine to Sit, Sit to Supine     Supine to sit: Min assist, Used rails, HOB elevated Sit to supine: Min assist   General bed mobility comments: Min assist with cues for sequencing and to initiate. Used rail and therapist's hand to rise. Min assist for LEs into bed. Extra time.    Transfers Overall transfer level: Needs assistance Equipment used: Rolling walker (2 wheels) Transfers: Sit to/from Stand Sit to Stand: From elevated surface, Total assist          Lateral/Scoot Transfers: Mod assist General transfer comment: Total assist with multiple attempts to stand. difficulty flexing forward, high fear of falling. Reviewed hand placement and techniques. Progressed with squat pivot lateral scoots along bed with mod assist to clear.    Ambulation/Gait               General Gait Details: unable at this time.  Stairs            Wheelchair Mobility     Tilt Bed    Modified Rankin (Stroke Patients Only)       Balance Overall balance assessment: Needs assistance Sitting-balance support: Feet supported, Bilateral upper extremity supported Sitting balance-Leahy Scale: Fair Sitting balance - Comments: progressed to  CGA. initially min assist.                                     Pertinent Vitals/Pain Pain Assessment Pain Assessment: Faces Faces Pain Scale: Hurts little more Pain Location: residual limb Pain Descriptors / Indicators: Grimacing, Guarding, Shooting, Throbbing, Operative site guarding Pain Intervention(s): Limited activity within patient's tolerance, Monitored during session, Premedicated before session, Repositioned  Home Living Family/patient expects to be discharged to:: Skilled nursing facility Living Arrangements: Alone Available Help at Discharge: Neighbor;Available PRN/intermittently;Personal care attendant Type of Home: Apartment Home Access: Level entry       Home Layout: One  level Home Equipment: Pharmacist, hospital (2 wheels);Grab bars - toilet;Grab bars - tub/shower;Hand held shower head      Prior Function Prior Level of Function : Independent/Modified Independent;Patient poor historian/Family not available             Mobility Comments: Pt using straight cane ADLs Comments: B/D self, does own shopping, aide cleans house     Extremity/Trunk Assessment   Upper Extremity Assessment Upper Extremity Assessment: Defer to OT evaluation    Lower Extremity Assessment Lower Extremity Assessment: RLE deficits/detail RLE Deficits / Details: BKA.       Communication   Communication Communication: No apparent difficulties    Cognition Arousal: Alert Behavior During Therapy: Flat affect   PT - Cognitive impairments: No family/caregiver present to determine baseline, Awareness, Safety/Judgement, Initiation, Problem solving, Sequencing                         Following commands: Impaired Following commands impaired: Only follows one step commands consistently, Follows one step commands inconsistently     Cueing Cueing Techniques: Verbal cues, Tactile cues, Gestural cues     General Comments      Exercises Amputee Exercises Quad Sets: AAROM, Right, 5 reps, Supine Knee Flexion: Right, 5 reps, AROM, Seated Knee Extension: AROM, Right, 5 reps, Seated Other Exercises Other Exercises: Seated balance, lateral lean.   Assessment/Plan    PT Assessment Patient needs continued PT services  PT Problem List Decreased strength;Decreased range of motion;Decreased activity tolerance;Decreased balance;Decreased mobility;Decreased knowledge of use of DME;Decreased safety awareness;Pain;Decreased cognition;Decreased knowledge of precautions       PT Treatment Interventions DME instruction;Gait training;Therapeutic activities;Functional mobility training;Therapeutic exercise;Balance training;Patient/family education;Wheelchair mobility  training    PT Goals (Current goals can be found in the Care Plan section)  Acute Rehab PT Goals Patient Stated Goal: Feel better PT Goal Formulation: With patient Time For Goal Achievement: 11/27/23 Potential to Achieve Goals: Fair    Frequency Min 2X/week     Co-evaluation               AM-PAC PT 6 Clicks Mobility  Outcome Measure Help needed turning from your back to your side while in a flat bed without using bedrails?: A Little Help needed moving from lying on your back to sitting on the side of a flat bed without using bedrails?: A Little Help needed moving to and from a bed to a chair (including a wheelchair)?: Total Help needed standing up from a chair using your arms (e.g., wheelchair or bedside chair)?: Total Help needed to walk in hospital room?: Total Help needed climbing 3-5 steps with a railing? : Total 6 Click Score: 10    End of Session Equipment Utilized During Treatment: Gait belt Activity Tolerance: Patient tolerated treatment well Patient left: in bed;with call bell/phone within reach;with bed alarm set (Rt knee in extension. elevated) Nurse Communication: Mobility status PT Visit Diagnosis: Other abnormalities of gait and mobility (R26.89);Muscle weakness (generalized) (M62.81);Difficulty in walking, not elsewhere classified (R26.2);Pain;Unsteadiness on feet (R26.81);History of falling (Z91.81);Other symptoms and signs involving the nervous system (R29.898) Pain - Right/Left: Right Pain - part of body: Leg    Time: 9062-9047 PT Time Calculation (min) (ACUTE ONLY): 15 min   Charges:  PT Evaluation $PT Re-evaluation: 1 Re-eval   PT General Charges $$ ACUTE PT VISIT: 1 Visit         Leontine Roads, PT, DPT [facility]  Rehabilitation Services Physical Therapist Office: [phone] Website: [email or web address]   Leontine GORMAN Roads 11/13/2023, 10:39 AM

## 2023-11-13 NOTE — TOC Progression Note (Addendum)
 Transition of Care [facility]) - Progression Note    Patient Details  Name: Jairen Goldfarb MRN: [id number] Date of Birth: June 29, 1955  [facility] CM/SW Contact  Montie LOISE Louder, [place] Phone Number: 11/13/2023, 11:52 AM  Clinical Narrative:     Received insurance authorization reference # [id number] from 08/30-09/02.   [facility] @ [phone]- informed patient will return to his clinic on Monday.  [name] - informed of anticipated d/c today.   Montie Louder, MSW, LCSW Clinical Social Worker    Expected Discharge Plan: Skilled Nursing Facility Barriers to Discharge: Barriers Resolved               Expected Discharge Plan and Services                                               Social Drivers of Health (SDOH) Interventions SDOH Screenings   Food Insecurity: No Food Insecurity (10/25/2023)  Housing: Low Risk  (10/25/2023)  Transportation Needs: No Transportation Needs (10/25/2023)  Utilities: Not At Risk (10/25/2023)  Financial Resource Strain: Low Risk  (11/05/2022)   Received from [facility]  Social Connections: Moderately Integrated (10/25/2023)  Tobacco Use: Low Risk  (10/29/2023)    Readmission Risk Interventions    05/05/2023   12:37 PM  Readmission Risk Prevention Plan  Transportation Screening Complete  Medication Review (RN Care Manager) Referral to Pharmacy  HRI or Home Care Consult Complete  SW Recovery Care/Counseling Consult Complete  Palliative Care Screening Not Applicable  Skilled Nursing Facility Not Applicable

## 2023-11-13 NOTE — Plan of Care (Signed)
  Problem: Education: Goal: Knowledge of General Education information will improve Description: Including pain rating scale, medication(s)/side effects and non-pharmacologic comfort measures Outcome: Completed/Met   Problem: Clinical Measurements: Goal: Ability to maintain clinical measurements within normal limits will improve Outcome: Completed/Met Goal: Will remain free from infection Outcome: Completed/Met Goal: Diagnostic test results will improve Outcome: Completed/Met Goal: Respiratory complications will improve Outcome: Completed/Met Goal: Cardiovascular complication will be avoided Outcome: Completed/Met   Problem: Nutrition: Goal: Adequate nutrition will be maintained Outcome: Completed/Met   Problem: Coping: Goal: Level of anxiety will decrease Outcome: Completed/Met   Problem: Pain Managment: Goal: General experience of comfort will improve and/or be controlled Outcome: Completed/Met   Problem: Skin Integrity: Goal: Risk for impaired skin integrity will decrease Outcome: Completed/Met

## 2023-11-13 NOTE — Progress Notes (Signed)
  [facility] Progress Note   Subjective:  Seen in room - c/o leg pain, but otherwise ok. No CP/dyspnea.   Objective Vitals:   11/12/23 1946 11/13/23 0405 11/13/23 0500 11/13/23 0738  BP: 119/75 (!) 160/100  109/64  Pulse: 71 84  77  Resp: 16 18  19   Temp: 99.5 F (37.5 C) 99.9 F (37.7 C)  98.6 F (37 C)  TempSrc: Oral Oral  Oral  SpO2: 98% 100%  95%  Weight:   72.2 kg   Height:       Physical Exam General: Well appearing, NAD. Room air Heart: RRR; no murmur Lungs: CTA anteriorly Abdomen: distended, but soft Extremities: no edema, R BKA Dialysis Access: LUE AVF +t/b  Additional Objective Labs: Basic Metabolic Panel: Recent Labs  Lab 11/08/23 0344 11/09/23 1124 11/10/23 0426  NA 131* 128* 130*  K 4.1 4.4 4.6  CL 93* 90* 89*  CO2 27 25 28   GLUCOSE 88 90 82  BUN 45* 35* 43*  CREATININE 8.82* 7.04* 8.56*  CALCIUM  9.9 9.4 10.4*  PHOS 4.8* 3.7  --    Liver Function Tests: Recent Labs  Lab 11/09/23 1124  ALBUMIN  2.2*   CBC: Recent Labs  Lab 11/07/23 0348 11/08/23 0344 11/09/23 1124 11/10/23 0426  WBC 9.4 8.6 9.7 8.8  HGB 10.7* 9.8* 10.4* 9.7*  HCT 34.0* 30.7* 32.1* 30.9*  MCV 88.1 87.5 86.5 88.5  PLT 271 295 338 290   Medications:   apixaban   5 mg Oral BID   aspirin  EC  81 mg Oral Daily   atorvastatin   10 mg Oral Daily   calcium  acetate  1,334 mg Oral TID WC   Chlorhexidine  Gluconate Cloth  6 each Topical Q0600   cinacalcet   30 mg Oral Q M,W,F-1800   [START ON 11/15/2023] darbepoetin (ARANESP ) injection - DIALYSIS  100 mcg Subcutaneous Q Mon-1800   ferrous sulfate   325 mg Oral Q breakfast   gabapentin   200 mg Oral QHS   irbesartan   300 mg Oral Daily   metoprolol  succinate  75 mg Oral Daily   sodium chloride  flush  3 mL Intravenous Q12H    Dialysis Orders MWF - [facility] 4hr, 400/600, EDW 84kg, 2K/2.5Ca bath, AVF, heparin  1000 unit bolus + 627mL/hr - Calcitriol  1.75mcg PO q HD - Sensipar  30mg  PO q HD - Venofer 50mg  IV  weekly - Mircera 120mcg IV q 2 weeks (last 7/28)  Assessment/Plan: RLE critical limb ischemia: S/p R BKA 8/15 by Dr. Lanis, per [facility]. LLE cellulitis: S/p IV antibiotics ESRD: Continue HD on MWF schedule - next 9/1. HTN/volume: BP good, chronic mild hypervolemia - UF as tolerated, now well below prior EDW. Anemia of ESRD: Hgb 9.7 - continue Aranesp  q Monday. On PO iron for now. Secondary HPTH: Ca high, not getting calcitriol  and Phoslo  has been reduced - follow labs. Phos ok. Nutrition: Alb low, adding PO supplements. A-fib: On Eliquis  Dispo: SNF placement pending.   Izetta Boehringer, PA-C 11/13/2023, 9:09 AM  [employer]

## 2023-11-13 NOTE — TOC Progression Note (Signed)
 Transition of Care [facility]) - Progression Note    Patient Details  Name: Jacob Daniel MRN: [id number] Date of Birth: [birth year]  Transition [facility]) CM/SW Contact  Montie LOISE Louder, [place] Phone Number: 11/13/2023, 11:13 AM  Clinical Narrative:     Insurance authorization pending for [name]/ SNF  reference # [id number]  [facility] will continue to follow and assist with discharge planning.   Montie Louder, MSW, LCSW Clinical Social Worker    Expected Discharge Plan: Skilled Nursing Facility Barriers to Discharge: Insurance Authorization               Expected Discharge Plan and Services                                               Social Drivers of Health (SDOH) Interventions SDOH Screenings   Food Insecurity: No Food Insecurity (10/25/2023)  Housing: Low Risk  (10/25/2023)  Transportation Needs: No Transportation Needs (10/25/2023)  Utilities: Not At Risk (10/25/2023)  Financial Resource Strain: Low Risk  (11/05/2022)   Received from [facility]  Social Connections: Moderately Integrated (10/25/2023)  Tobacco Use: Low Risk  (10/29/2023)    Readmission Risk Interventions    05/05/2023   12:37 PM  Readmission Risk Prevention Plan  Transportation Screening Complete  Medication Review (RN Care Manager) Referral to Pharmacy  HRI or Home Care Consult Complete  SW Recovery Care/Counseling Consult Complete  Palliative Care Screening Not Applicable  Skilled Nursing Facility Not Applicable

## 2023-11-13 NOTE — Plan of Care (Signed)
  Problem: Education: Goal: Knowledge of General Education information will improve Description: Including pain rating scale, medication(s)/side effects and non-pharmacologic comfort measures Outcome: Progressing   Problem: Health Behavior/Discharge Planning: Goal: Ability to manage health-related needs will improve Outcome: Progressing   Problem: Clinical Measurements: Goal: Ability to maintain clinical measurements within normal limits will improve Outcome: Progressing Goal: Will remain free from infection Outcome: Progressing Goal: Diagnostic test results will improve Outcome: Progressing Goal: Respiratory complications will improve Outcome: Progressing Goal: Cardiovascular complication will be avoided Outcome: Progressing   Problem: Activity: Goal: Risk for activity intolerance will decrease Outcome: Progressing   Problem: Nutrition: Goal: Adequate nutrition will be maintained Outcome: Progressing   Problem: Coping: Goal: Level of anxiety will decrease Outcome: Progressing   Problem: Elimination: Goal: Will not experience complications related to bowel motility Outcome: Progressing Goal: Will not experience complications related to urinary retention Outcome: Progressing   Problem: Pain Managment: Goal: General experience of comfort will improve and/or be controlled Outcome: Progressing   Problem: Safety: Goal: Ability to remain free from injury will improve Outcome: Progressing   Problem: Skin Integrity: Goal: Risk for impaired skin integrity will decrease Outcome: Progressing   Problem: Education: Goal: Ability to demonstrate management of disease process will improve Outcome: Progressing Goal: Ability to verbalize understanding of medication therapies will improve Outcome: Progressing   Problem: Activity: Goal: Capacity to carry out activities will improve Outcome: Progressing   Problem: Cardiac: Goal: Ability to achieve and maintain adequate  cardiopulmonary perfusion will improve Outcome: Progressing   Problem: Education: Goal: Knowledge of disease and its progression will improve Outcome: Progressing   Problem: Fluid Volume: Goal: Compliance with measures to maintain balanced fluid volume will improve Outcome: Progressing   Problem: Health Behavior/Discharge Planning: Goal: Ability to manage health-related needs will improve Outcome: Progressing   Problem: Nutritional: Goal: Ability to make healthy dietary choices will improve Outcome: Progressing   Problem: Clinical Measurements: Goal: Complications related to the disease process, condition or treatment will be avoided or minimized Outcome: Progressing   Problem: Education: Goal: Understanding of CV disease, CV risk reduction, and recovery process will improve Outcome: Progressing Goal: Individualized Educational Video(s) Outcome: Progressing   Problem: Activity: Goal: Ability to return to baseline activity level will improve Outcome: Progressing   Problem: Cardiovascular: Goal: Ability to achieve and maintain adequate cardiovascular perfusion will improve Outcome: Progressing Goal: Vascular access site(s) Level 0-1 will be maintained Outcome: Progressing   Problem: Health Behavior/Discharge Planning: Goal: Ability to safely manage health-related needs after discharge will improve Outcome: Progressing

## 2023-11-13 NOTE — Progress Notes (Signed)
 DC order noted per MD. DC RN at bedside. Plan for transfer to [name]. Transfer packet complete with AVS, prescriptions and medical necessity.

## 2023-11-16 NOTE — Progress Notes (Addendum)
 Late note entry 11/16/23 8:49am Contacted [facility] to inform that pt was d/c over weekend and anticipated arrival date. Last progress note and d/c summary faxed at this time.  [facility] [phone]

## 2023-11-17 ENCOUNTER — Other Ambulatory Visit: Payer: Self-pay

## 2023-11-17 ENCOUNTER — Emergency Department (HOSPITAL_COMMUNITY)
Admission: EM | Admit: 2023-11-17 | Discharge: 2023-11-17 | Disposition: A | Discharge status: Skilled Nursing Facility | Source: Skilled Nursing Facility | Attending: Emergency Medicine | Admitting: Emergency Medicine

## 2023-11-17 ENCOUNTER — Encounter (HOSPITAL_COMMUNITY): Payer: Self-pay | Admitting: Emergency Medicine

## 2023-11-17 DIAGNOSIS — Y828 Other medical devices associated with adverse incidents: Secondary | ICD-10-CM | POA: Diagnosis not present

## 2023-11-17 DIAGNOSIS — Z7901 Long term (current) use of anticoagulants: Secondary | ICD-10-CM | POA: Diagnosis not present

## 2023-11-17 DIAGNOSIS — N186 End stage renal disease: Secondary | ICD-10-CM | POA: Diagnosis not present

## 2023-11-17 DIAGNOSIS — Z992 Dependence on renal dialysis: Secondary | ICD-10-CM | POA: Diagnosis not present

## 2023-11-17 DIAGNOSIS — T829XXA Unspecified complication of cardiac and vascular prosthetic device, implant and graft, initial encounter: Secondary | ICD-10-CM

## 2023-11-17 DIAGNOSIS — T82590A Other mechanical complication of surgically created arteriovenous fistula, initial encounter: Secondary | ICD-10-CM | POA: Insufficient documentation

## 2023-11-17 DIAGNOSIS — Z7982 Long term (current) use of aspirin: Secondary | ICD-10-CM | POA: Insufficient documentation

## 2023-11-17 NOTE — Discharge Instructions (Signed)

## 2023-11-17 NOTE — ED Notes (Addendum)
 Called report to facility where pt resides. [facility]. RN Kyra notified.

## 2023-11-17 NOTE — ED Provider Notes (Signed)
 Jacob Daniel EMERGENCY DEPARTMENT AT [facility] Provider Note   CSN: [id number] Arrival date & time: 11/17/23  1207     Patient presents with: Vascular Access Problem   Jacob Daniel is a 68 y.o. male.   HPI Patient presents via EMS after bleeding episode at dialysis. Patient's history is notable for multiple medical problems and I discussed it with his care home nursing staff just prior to his arrival. Per nursing staff patient has end-stage renal disease, recent amputation of his right leg, cardiac disease, history of alcohol abuse, and request enrollment in hospice services which were anticipated to have consultation or later today. Today patient completed dialysis session, and had bleeding from his access point following his removal of the device himself. EMS reports no hemodynamic instability and transport. I discussed patient's arrival at bedside with EMS staff.    Prior to Admission medications   Medication Sig Start Date End Date Taking? Authorizing Provider  acetaminophen  (TYLENOL ) 325 MG tablet Take 2 tablets (650 mg total) by mouth every 6 (six) hours as needed for mild pain (pain score 1-3) or fever (or Fever >/= 101). 11/13/23   Barbarann Nest, MD  albuterol  (ACCUNEB ) 0.63 MG/3ML nebulizer solution Take 1 ampule by nebulization every 6 (six) hours as needed for wheezing.    [provider]  apixaban  (ELIQUIS ) 5 MG TABS tablet Take 1 tablet (5 mg total) by mouth 2 (two) times daily. 04/10/23   Jillian Buttery, MD  aspirin  EC 81 MG tablet Take 1 tablet (81 mg total) by mouth daily. Swallow whole. 11/14/23   Barbarann Nest, MD  atorvastatin  (LIPITOR) 10 MG tablet Take 1 tablet (10 mg total) by mouth daily. Patient not taking: Reported on 10/26/2023 04/10/23 01/13/27  Jillian Buttery, MD  bisacodyl  (DULCOLAX) 5 MG EC tablet Take 1 tablet (5 mg total) by mouth daily as needed for moderate constipation. 11/13/23   Barbarann Nest, MD  calcium  acetate (PHOSLO )  667 MG capsule Take 2 capsules (1,334 mg total) by mouth 3 (three) times daily with meals. 11/13/23   Barbarann Nest, MD  cinacalcet  (SENSIPAR ) 30 MG tablet Take 1 tablet (30 mg total) by mouth every Monday, Wednesday, and Friday at 6 PM. 11/15/23   Barbarann Nest, MD  Darbepoetin Alfa  (ARANESP ) 100 MCG/0.5ML SOSY injection Inject 0.5 mLs (100 mcg total) into the skin every Monday at 6 PM. 11/15/23   Barbarann Nest, MD  gabapentin  (NEURONTIN ) 100 MG capsule Take 2 capsules (200 mg total) by mouth at bedtime. 11/13/23   Barbarann Nest, MD  ipratropium-albuterol  (DUONEB) 0.5-2.5 (3) MG/3ML SOLN Take 3 mLs by nebulization every 6 (six) hours as needed (for shortness of breath and wheezing). 02/16/23   [provider]  irbesartan  (AVAPRO ) 300 MG tablet Take 1 tablet (300 mg total) by mouth daily. Patient not taking: Reported on 10/26/2023 04/11/23   Jillian Buttery, MD  magnesium  oxide (MAG-OX) 400 MG tablet Take 1 tablet (400 mg total) by mouth daily. Patient not taking: Reported on 10/26/2023 04/10/23   Jillian Buttery, MD  metoprolol  succinate (TOPROL -XL) 25 MG 24 hr tablet Take 3 tablets (75 mg total) by mouth daily. 04/11/23   Jillian Buttery, MD  Mouthwashes (MOUTH RINSE) LIQD solution 15 mLs by Mouth Rinse route as needed (for oral care). 11/13/23   Barbarann Nest, MD  Nutritional Supplements (,FEEDING SUPPLEMENT, PROSOURCE PLUS) liquid Take 30 mLs by mouth 2 (two) times daily between meals. 11/13/23   Barbarann Nest, MD  oxyCODONE  (OXY IR/ROXICODONE ) 5 MG immediate  release tablet Take 1 tablet (5 mg total) by mouth every 4 (four) hours as needed for moderate pain (pain score 4-6). 11/13/23   Barbarann Nest, MD  triamcinolone ointment (KENALOG) 0.1 % Apply 1 Application topically 3 (three) times daily. 10/11/23   [provider]    Allergies: Patient has no known allergies.    Review of Systems  Updated Vital Signs BP (!) 150/88   Pulse 64   Temp 98.2 F (36.8 C) (Oral)   Resp 14    SpO2 96%   Physical Exam Vitals and nursing note reviewed.  Constitutional:      General: He is not in acute distress.    Appearance: He is well-developed. He is ill-appearing. He is not toxic-appearing.  HENT:     Head: Normocephalic and atraumatic.  Eyes:     Conjunctiva/sclera: Conjunctivae normal.  Cardiovascular:     Rate and Rhythm: Normal rate and regular rhythm.  Pulmonary:     Effort: Pulmonary effort is normal. No respiratory distress.     Breath sounds: No stridor.  Abdominal:     General: There is no distension.  Musculoskeletal:     Comments: Right leg status post amputation. Left dialysis catheter with pressure dressing on, no bleeding, distal pulses appropriate, thrill appropriate.  Skin:    General: Skin is warm and dry.  Neurological:     Motor: Atrophy present.  Psychiatric:        Behavior: Behavior is slowed and withdrawn.     (all labs ordered are listed, but only abnormal results are displayed) Labs Reviewed  I-STAT CHEM 8, ED    EKG: None  Radiology: No results found.   Procedures   Medications Ordered in the ED - No data to display                                  Medical Decision Making Adult male presents after bleeding complication from dialysis after completing this session in the context of ongoing therapy for multiple medical issues including A-fib, heart failure, stage renal disease, recent amputation. Patient's vital signs initially reassuring, cardiac 65 sinus normal pulse ox 100% room air normal.  Suspicion for bleeding complication, no early evidence for complicating factors.  Given bleeding, chemistry eval sent with hemoglobin.   Amount and/or Complexity of Data Reviewed Independent Historian: EMS    Details: And nursing home staff External Data Reviewed: notes. Labs: ordered. Decision-making details documented in ED Course.  Risk Decision regarding hospitalization. Diagnosis or treatment significantly limited by  social determinants of health.   Patient in similar condition on repeat exam, stable vital signs, point-of-care hemoglobin 9, patient will return to his nursing facility.     Final diagnoses:  Complication of arteriovenous dialysis fistula, initial encounter     Garrick Charleston, MD 11/17/23 1400

## 2023-11-17 NOTE — ED Triage Notes (Signed)
 Pt completed his dialysis tx today and was noted to have uncontrolled hemorrhage from access site afterwards.  Pressure applied and bleeding controlled at this time.  Pt has had recent amputation of right lower extremity and only complaints at this time are of pain in that area.  Dialysis center concerned about pt's hemoglobin as it is baseline low.

## 2023-11-17 NOTE — Progress Notes (Signed)
 Pt receives out-pt HD at [facility], MWF, 0600 chair time. Will assist as needed.   [facility] [phone] [facility]#- [phone]  Addendum 9:02am 11/18/23 Contacted clinic to expect pt arrival Friday. No other assistance needed.

## 2023-11-23 LAB — I-STAT CHEM 8, ED
BUN: 25 mg/dL — ABNORMAL HIGH (ref 8–23)
Calcium, Ion: 0.71 mmol/L — CL (ref 1.15–1.40)
Chloride: 101 mmol/L (ref 98–111)
Creatinine, Ser: 5.2 mg/dL — ABNORMAL HIGH (ref 0.61–1.24)
Glucose, Bld: 79 mg/dL (ref 70–99)
HCT: 30 % — ABNORMAL LOW (ref 39.0–52.0)
Hemoglobin: 10.2 g/dL — ABNORMAL LOW (ref 13.0–17.0)
Potassium: 3.9 mmol/L (ref 3.5–5.1)
Sodium: 130 mmol/L — ABNORMAL LOW (ref 135–145)
TCO2: 26 mmol/L (ref 22–32)

## 2023-12-07 ENCOUNTER — Ambulatory Visit: Attending: Cardiology | Admitting: Cardiology

## 2023-12-09 ENCOUNTER — Ambulatory Visit

## 2023-12-15 DEATH — deceased
